# Patient Record
Sex: Female | Born: 1975 | Race: White | Hispanic: No | Smoking: Never smoker
Health system: Southern US, Community
[De-identification: ages and names within clinical notes are randomized; demographics above are authoritative.]

## PROBLEM LIST (undated history)

## (undated) DIAGNOSIS — F329 Major depressive disorder, single episode, unspecified: Secondary | ICD-10-CM

## (undated) DIAGNOSIS — Z5189 Encounter for other specified aftercare: Secondary | ICD-10-CM

## (undated) DIAGNOSIS — J45909 Unspecified asthma, uncomplicated: Secondary | ICD-10-CM

## (undated) DIAGNOSIS — M5430 Sciatica, unspecified side: Secondary | ICD-10-CM

## (undated) DIAGNOSIS — K589 Irritable bowel syndrome without diarrhea: Secondary | ICD-10-CM

## (undated) DIAGNOSIS — G43909 Migraine, unspecified, not intractable, without status migrainosus: Secondary | ICD-10-CM

## (undated) DIAGNOSIS — M545 Low back pain, unspecified: Secondary | ICD-10-CM

## (undated) DIAGNOSIS — K219 Gastro-esophageal reflux disease without esophagitis: Secondary | ICD-10-CM

## (undated) DIAGNOSIS — G4700 Insomnia, unspecified: Secondary | ICD-10-CM

## (undated) DIAGNOSIS — F988 Other specified behavioral and emotional disorders with onset usually occurring in childhood and adolescence: Secondary | ICD-10-CM

## (undated) DIAGNOSIS — G4730 Sleep apnea, unspecified: Secondary | ICD-10-CM

## (undated) DIAGNOSIS — F419 Anxiety disorder, unspecified: Secondary | ICD-10-CM

## (undated) DIAGNOSIS — M940 Chondrocostal junction syndrome [Tietze]: Secondary | ICD-10-CM

## (undated) DIAGNOSIS — F32A Depression, unspecified: Secondary | ICD-10-CM

## (undated) HISTORY — PX: ABDOMINAL HYSTERECTOMY: SHX81

## (undated) HISTORY — DX: Anxiety disorder, unspecified: F41.9

## (undated) HISTORY — PX: TONSILLECTOMY: SUR1361

## (undated) HISTORY — PX: APPENDECTOMY: SHX54

## (undated) HISTORY — DX: Sciatica, unspecified side: M54.30

## (undated) HISTORY — DX: Depression, unspecified: F32.A

## (undated) HISTORY — DX: Chondrocostal junction syndrome (tietze): M94.0

## (undated) HISTORY — DX: Insomnia, unspecified: G47.00

## (undated) HISTORY — DX: Low back pain: M54.5

## (undated) HISTORY — DX: Sleep apnea, unspecified: G47.30

## (undated) HISTORY — PX: TOTAL ABDOMINAL HYSTERECTOMY W/ BILATERAL SALPINGOOPHORECTOMY: SHX83

## (undated) HISTORY — DX: Other specified behavioral and emotional disorders with onset usually occurring in childhood and adolescence: F98.8

## (undated) HISTORY — DX: Low back pain, unspecified: M54.50

## (undated) HISTORY — DX: Encounter for other specified aftercare: Z51.89

## (undated) HISTORY — DX: Gastro-esophageal reflux disease without esophagitis: K21.9

## (undated) HISTORY — DX: Irritable bowel syndrome, unspecified: K58.9

## (undated) HISTORY — DX: Major depressive disorder, single episode, unspecified: F32.9

## (undated) HISTORY — DX: Unspecified asthma, uncomplicated: J45.909

## (undated) HISTORY — PX: TUBAL LIGATION: SHX77

---

## 2004-03-03 ENCOUNTER — Observation Stay (HOSPITAL_COMMUNITY): Admission: RE | Admit: 2004-03-03 | Discharge: 2004-03-04 | Payer: Self-pay | Admitting: Gynecology

## 2004-03-08 ENCOUNTER — Emergency Department: Payer: Self-pay | Admitting: Emergency Medicine

## 2004-10-28 ENCOUNTER — Ambulatory Visit: Payer: Self-pay

## 2004-10-29 ENCOUNTER — Ambulatory Visit: Payer: Self-pay

## 2004-10-30 ENCOUNTER — Ambulatory Visit: Payer: Self-pay

## 2004-11-11 ENCOUNTER — Ambulatory Visit: Payer: Self-pay

## 2005-07-24 ENCOUNTER — Ambulatory Visit (HOSPITAL_COMMUNITY): Admission: RE | Admit: 2005-07-24 | Discharge: 2005-07-24 | Payer: Self-pay | Admitting: Gynecology

## 2005-07-27 ENCOUNTER — Observation Stay: Payer: Self-pay | Admitting: Obstetrics and Gynecology

## 2005-08-28 ENCOUNTER — Ambulatory Visit: Payer: Self-pay | Admitting: Gynecology

## 2005-11-13 ENCOUNTER — Other Ambulatory Visit: Payer: Self-pay

## 2005-11-13 ENCOUNTER — Emergency Department: Payer: Self-pay | Admitting: Emergency Medicine

## 2006-02-15 ENCOUNTER — Emergency Department: Payer: Self-pay | Admitting: Emergency Medicine

## 2006-06-25 ENCOUNTER — Inpatient Hospital Stay: Payer: Self-pay | Admitting: Obstetrics and Gynecology

## 2006-08-11 ENCOUNTER — Ambulatory Visit: Payer: Self-pay | Admitting: Family Medicine

## 2006-12-14 ENCOUNTER — Emergency Department: Payer: Self-pay | Admitting: Emergency Medicine

## 2007-03-16 ENCOUNTER — Emergency Department: Payer: Self-pay | Admitting: Emergency Medicine

## 2007-06-16 ENCOUNTER — Emergency Department: Payer: Self-pay

## 2007-08-18 ENCOUNTER — Emergency Department: Payer: Self-pay | Admitting: Emergency Medicine

## 2007-11-15 ENCOUNTER — Emergency Department: Payer: Self-pay | Admitting: Emergency Medicine

## 2007-11-15 ENCOUNTER — Other Ambulatory Visit: Payer: Self-pay

## 2008-07-31 ENCOUNTER — Emergency Department: Payer: Self-pay | Admitting: Emergency Medicine

## 2010-06-18 ENCOUNTER — Emergency Department: Payer: Self-pay | Admitting: Unknown Physician Specialty

## 2010-06-28 ENCOUNTER — Encounter: Payer: Self-pay | Admitting: Gynecology

## 2010-09-25 ENCOUNTER — Emergency Department: Payer: Self-pay | Admitting: Internal Medicine

## 2011-01-11 ENCOUNTER — Emergency Department: Payer: Self-pay | Admitting: Emergency Medicine

## 2011-03-06 ENCOUNTER — Emergency Department: Payer: Self-pay | Admitting: Emergency Medicine

## 2011-03-24 ENCOUNTER — Emergency Department: Payer: Self-pay | Admitting: Emergency Medicine

## 2011-07-21 ENCOUNTER — Emergency Department: Payer: Self-pay | Admitting: Emergency Medicine

## 2011-07-21 LAB — URINALYSIS, COMPLETE
Bilirubin,UR: NEGATIVE
Blood: NEGATIVE
Glucose,UR: NEGATIVE mg/dL (ref 0–75)
Leukocyte Esterase: NEGATIVE
Nitrite: POSITIVE
Ph: 7 (ref 4.5–8.0)
Protein: NEGATIVE
RBC,UR: 3 /HPF (ref 0–5)
Specific Gravity: 1.023 (ref 1.003–1.030)
Squamous Epithelial: 2
WBC UR: 1 /HPF (ref 0–5)

## 2011-07-21 LAB — COMPREHENSIVE METABOLIC PANEL
Albumin: 4.9 g/dL (ref 3.4–5.0)
Alkaline Phosphatase: 85 U/L (ref 50–136)
Anion Gap: 9 (ref 7–16)
BUN: 9 mg/dL (ref 7–18)
Bilirubin,Total: 0.2 mg/dL (ref 0.2–1.0)
Calcium, Total: 9.6 mg/dL (ref 8.5–10.1)
Chloride: 102 mmol/L (ref 98–107)
Co2: 28 mmol/L (ref 21–32)
Creatinine: 0.6 mg/dL (ref 0.60–1.30)
EGFR (African American): >60
EGFR (Non-African Amer.): >60
Glucose: 98 mg/dL (ref 65–99)
Osmolality: 276 (ref 275–301)
Potassium: 4.5 mmol/L (ref 3.5–5.1)
SGOT(AST): 30 U/L (ref 15–37)
SGPT (ALT): 25 U/L
Sodium: 139 mmol/L (ref 136–145)
Total Protein: 8.8 g/dL — ABNORMAL HIGH (ref 6.4–8.2)

## 2011-07-21 LAB — DRUG SCREEN, URINE
Amphetamines, Ur Screen: POSITIVE (ref ?–1000)
Barbiturates, Ur Screen: NEGATIVE (ref ?–200)
Benzodiazepine, Ur Scrn: NEGATIVE (ref ?–200)
Cannabinoid 50 Ng, Ur ~~LOC~~: NEGATIVE (ref ?–50)
Cocaine Metabolite,Ur ~~LOC~~: NEGATIVE (ref ?–300)
MDMA (Ecstasy)Ur Screen: NEGATIVE (ref ?–500)
Methadone, Ur Screen: NEGATIVE (ref ?–300)
Opiate, Ur Screen: POSITIVE (ref ?–300)
Phencyclidine (PCP) Ur S: NEGATIVE (ref ?–25)
Tricyclic, Ur Screen: NEGATIVE (ref ?–1000)

## 2011-07-21 LAB — DIFFERENTIAL
Basophil #: 0 10*3/uL (ref 0.0–0.1)
Basophil %: 0.2 %
Eosinophil #: 0 10*3/uL (ref 0.0–0.7)
Eosinophil %: 0.3 %
Lymphocyte #: 2.1 10*3/uL (ref 1.0–3.6)
Lymphocyte %: 15.3 %
Monocyte #: 0.7 10*3/uL (ref 0.0–0.7)
Monocyte %: 4.8 %
Neutrophil #: 11 10*3/uL — ABNORMAL HIGH (ref 1.4–6.5)
Neutrophil %: 79.4 %

## 2011-07-21 LAB — CBC
HCT: 35.1 % (ref 35.0–47.0)
HGB: 11.9 g/dL — ABNORMAL LOW (ref 12.0–16.0)
MCH: 30.7 pg (ref 26.0–34.0)
MCHC: 34 g/dL (ref 32.0–36.0)
MCV: 90 fL (ref 80–100)
Platelet: 306 10*3/uL (ref 150–440)
RBC: 3.88 10*6/uL (ref 3.80–5.20)
RDW: 13.9 % (ref 11.5–14.5)
WBC: 13.9 10*3/uL — ABNORMAL HIGH (ref 3.6–11.0)

## 2011-07-21 LAB — RAPID INFLUENZA A&B ANTIGENS

## 2011-07-21 LAB — TROPONIN I: Troponin-I: <0.02 ng/mL

## 2011-11-30 ENCOUNTER — Emergency Department: Payer: Self-pay | Admitting: Emergency Medicine

## 2012-02-24 ENCOUNTER — Emergency Department: Payer: Self-pay | Admitting: Emergency Medicine

## 2012-12-23 ENCOUNTER — Emergency Department: Payer: Self-pay | Admitting: Emergency Medicine

## 2013-08-31 ENCOUNTER — Ambulatory Visit: Payer: Self-pay

## 2013-09-06 ENCOUNTER — Ambulatory Visit: Payer: Self-pay | Admitting: Family Medicine

## 2014-04-28 ENCOUNTER — Ambulatory Visit: Payer: Self-pay | Admitting: Internal Medicine

## 2014-05-02 DIAGNOSIS — K219 Gastro-esophageal reflux disease without esophagitis: Secondary | ICD-10-CM | POA: Insufficient documentation

## 2015-03-27 ENCOUNTER — Other Ambulatory Visit: Payer: Self-pay | Admitting: Internal Medicine

## 2015-03-27 DIAGNOSIS — M25552 Pain in left hip: Secondary | ICD-10-CM

## 2015-06-21 ENCOUNTER — Ambulatory Visit
Admission: EM | Admit: 2015-06-21 | Discharge: 2015-06-21 | Disposition: A | Discharge status: Home / Self Care | Payer: Medicaid Other | Attending: Family Medicine | Admitting: Family Medicine

## 2015-06-21 ENCOUNTER — Ambulatory Visit: Payer: Medicaid Other

## 2015-06-21 ENCOUNTER — Encounter: Payer: Self-pay | Admitting: Emergency Medicine

## 2015-06-21 DIAGNOSIS — X58XXXA Exposure to other specified factors, initial encounter: Secondary | ICD-10-CM | POA: Insufficient documentation

## 2015-06-21 DIAGNOSIS — S20212A Contusion of left front wall of thorax, initial encounter: Secondary | ICD-10-CM | POA: Diagnosis not present

## 2015-06-21 DIAGNOSIS — R079 Chest pain, unspecified: Secondary | ICD-10-CM | POA: Diagnosis present

## 2015-06-21 MED ORDER — CYCLOBENZAPRINE HCL 10 MG PO TABS: 10.0000 mg | ORAL_TABLET | Freq: Every day | ORAL | Status: DC

## 2015-06-21 MED ORDER — HYDROCODONE-ACETAMINOPHEN 5-325 MG PO TABS: ORAL_TABLET | ORAL | Status: DC
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
## 2015-09-27 DIAGNOSIS — M5432 Sciatica, left side: Secondary | ICD-10-CM | POA: Insufficient documentation

## 2015-12-08 ENCOUNTER — Encounter: Payer: Self-pay | Admitting: Gynecology

## 2015-12-08 ENCOUNTER — Ambulatory Visit
Admission: EM | Admit: 2015-12-08 | Discharge: 2015-12-08 | Disposition: A | Discharge status: Home / Self Care | Payer: Medicaid Other | Attending: Family Medicine | Admitting: Family Medicine

## 2015-12-08 DIAGNOSIS — G43009 Migraine without aura, not intractable, without status migrainosus: Secondary | ICD-10-CM | POA: Diagnosis not present

## 2015-12-08 HISTORY — DX: Migraine, unspecified, not intractable, without status migrainosus: G43.909

## 2015-12-08 MED ORDER — PROMETHAZINE HCL 25 MG/ML IJ SOLN
25.0000 mg | Freq: Once | INTRAMUSCULAR | Status: AC
  Administered 2015-12-08: 25 mg via INTRAMUSCULAR

## 2015-12-08 MED ORDER — KETOROLAC TROMETHAMINE 60 MG/2ML IM SOLN
60.0000 mg | Freq: Once | INTRAMUSCULAR | Status: AC
  Administered 2015-12-08: 60 mg via INTRAMUSCULAR

## 2015-12-08 MED ORDER — ONDANSETRON 8 MG PO TBDP
8.0000 mg | ORAL_TABLET | Freq: Once | ORAL | Status: AC
  Administered 2015-12-08: 8 mg via ORAL

## 2015-12-08 NOTE — Discharge Instructions (Signed)

## 2015-12-08 NOTE — ED Notes (Signed)
Patient c/o of migraine x last pm. Per patient has an history of migraine and take Rx Maxalt.

## 2015-12-08 NOTE — ED Notes (Signed)
Patient waited 15 minutes and no reactions were noted. Injection site is unremarkable. 

## 2016-01-21 NOTE — ED Provider Notes (Signed)
[facility]    CSN: [id number] Arrival date & time: 12/08/15  1234  First Provider Contact:  First MD Initiated Contact with Patient 12/08/15 1306        History   Chief Complaint Chief Complaint  Patient presents with  . Migraine    HPI Laurie Althia FortsDianne Roebuck is a 40 y.o. female.   The history is provided by the patient.  Migraine   Patient c/o of migraine x last pm. Per patient has an history of migraine and take Rx Maxalt. Migraine associated with photophobia, nausea. Denies numbness/tingling, fevers, chills, vision changes, one-sided weakness.   Past Medical History:  Diagnosis Date  . Migraine     There are no active problems to display for this patient.   Past Surgical History:  Procedure Laterality Date  . ABDOMINAL HYSTERECTOMY    . APPENDECTOMY    . CESAREAN SECTION    . TONSILLECTOMY      OB History    No data available       Home Medications    Prior to Admission medications   Medication Sig Start Date End Date Taking? Authorizing Provider  rizatriptan (MAXALT) 10 MG tablet Take 10 mg by mouth as needed for migraine. May repeat in 2 hours if needed   Yes Historical Provider, MD  acetaminophen (TYLENOL) 500 MG tablet Take 500 mg by mouth every 6 (six) hours as needed.    Historical Provider, MD  cyclobenzaprine (FLEXERIL) 10 MG tablet Take 1 tablet (10 mg total) by mouth at bedtime. 06/21/15   Payton Mccallumrlando Abdallah Hern, MD  HYDROcodone-acetaminophen (NORCO/VICODIN) 5-325 MG tablet 1-2 tabs po q 8 hours prn 06/21/15   Payton Mccallumrlando Sadiyah Kangas, MD  ibuprofen (ADVIL,MOTRIN) 800 MG tablet Take 800 mg by mouth every 8 (eight) hours as needed.    Historical Provider, MD    Family History No family history on file.  Social History Social History  Substance Use Topics  . Smoking status: Never Smoker  . Smokeless tobacco: Not on file  . Alcohol use No     Allergies   Ultram [tramadol] and Sulfa antibiotics   Review of Systems Review of Systems   Physical  Exam Triage Vital Signs ED Triage Vitals  Enc Vitals Group     BP 12/08/15 1249 131/85     Pulse Rate 12/08/15 1249 84     Resp 12/08/15 1249 16     Temp 12/08/15 1249 98 F (36.7 C)     Temp Source 12/08/15 1249 Oral     SpO2 12/08/15 1249 100 %     Weight 12/08/15 1249 160 lb (72.6 kg)     Height 12/08/15 1249 4\' 11"  (1.499 m)     Head Circumference --      Peak Flow --      Pain Score 12/08/15 1254 8     Pain Loc --      Pain Edu? --      Excl. in GC? --    No data found.   Updated Vital Signs BP 131/85 (BP Location: Left Arm)   Pulse 84   Temp 98 F (36.7 C) (Oral)   Resp 16   Ht 4\' 11"  (1.499 m)   Wt 160 lb (72.6 kg)   SpO2 100%   BMI 32.32 kg/m   Visual Acuity Right Eye Distance:   Left Eye Distance:   Bilateral Distance:    Right Eye Near:   Left Eye Near:    Bilateral Near:  Physical Exam  Constitutional: She is oriented to person, place, and time. She appears well-developed and well-nourished. No distress.  HENT:  Head: Normocephalic.  Right Ear: Tympanic membrane, external ear and ear canal normal.  Left Ear: Tympanic membrane, external ear and ear canal normal.  Nose: Nose normal.  Mouth/Throat: Oropharynx is clear and moist and mucous membranes are normal.  Eyes: Conjunctivae and EOM are normal. Pupils are equal, round, and reactive to light. Right eye exhibits no discharge. Left eye exhibits no discharge. No scleral icterus.  Neck: Normal range of motion. Neck supple. No JVD present. No tracheal deviation present. No thyromegaly present.  Cardiovascular: Normal rate, regular rhythm and normal heart sounds.   No murmur heard. Pulmonary/Chest: Effort normal. No stridor. No respiratory distress.  Musculoskeletal: She exhibits no edema or tenderness.  Lymphadenopathy:    She has no cervical adenopathy.  Neurological: She is alert and oriented to person, place, and time. She has normal reflexes. She displays normal reflexes. No cranial nerve  deficit. She exhibits normal muscle tone. Coordination normal.  Skin: Skin is warm and dry. No rash noted. She is not diaphoretic. No erythema. No pallor.  Psychiatric: She has a normal mood and affect. Her behavior is normal. Judgment and thought content normal.  Nursing note and vitals reviewed.    UC Treatments / Results  Labs (all labs ordered are listed, but only abnormal results are displayed) Labs Reviewed - No data to display  EKG  EKG Interpretation None       Radiology No results found.  Procedures Procedures (including critical care time)  Medications Ordered in UC Medications  ketorolac (TORADOL) injection 60 mg (60 mg Intramuscular Given 12/08/15 1315)  ondansetron (ZOFRAN-ODT) disintegrating tablet 8 mg (8 mg Oral Given 12/08/15 1317)  promethazine (PHENERGAN) injection 25 mg (25 mg Intramuscular Given 12/08/15 1328)     Initial Impression / Assessment and Plan / UC Course  I have reviewed the triage vital signs and the nursing notes.  Pertinent labs & imaging results that were available during my care of the patient were reviewed by me and considered in my medical decision making (see chart for details).  Clinical Course      Final Clinical Impressions(s) / UC Diagnoses   Final diagnoses:  Nonintractable migraine, unspecified migraine type    New Prescriptions Discharge Medication List as of 12/08/2015  2:07 PM     Discharge Medication List as of 12/08/2015  2:07 PM     1. diagnosis reviewed with patient 2. Patient given toradol 60mg  im x 1, zofran 8mg  odt x 1 and phenergan 25mg  im x 1 3. Recommend supportive treatment with home medications 4. Follow-up prn if symptoms worsen or don't improve   Payton Mccallumrlando Loucinda Croy, MD 01/21/16 1059

## 2017-01-20 DIAGNOSIS — G4719 Other hypersomnia: Secondary | ICD-10-CM | POA: Diagnosis not present

## 2017-01-20 DIAGNOSIS — R0683 Snoring: Secondary | ICD-10-CM | POA: Diagnosis not present

## 2017-01-20 DIAGNOSIS — G43509 Persistent migraine aura without cerebral infarction, not intractable, without status migrainosus: Secondary | ICD-10-CM | POA: Diagnosis not present

## 2017-02-18 DIAGNOSIS — G4733 Obstructive sleep apnea (adult) (pediatric): Secondary | ICD-10-CM | POA: Insufficient documentation

## 2017-04-01 DIAGNOSIS — G4733 Obstructive sleep apnea (adult) (pediatric): Secondary | ICD-10-CM | POA: Diagnosis not present

## 2017-04-01 DIAGNOSIS — G43509 Persistent migraine aura without cerebral infarction, not intractable, without status migrainosus: Secondary | ICD-10-CM | POA: Diagnosis not present

## 2017-04-01 DIAGNOSIS — K219 Gastro-esophageal reflux disease without esophagitis: Secondary | ICD-10-CM | POA: Diagnosis not present

## 2017-05-13 DIAGNOSIS — G4733 Obstructive sleep apnea (adult) (pediatric): Secondary | ICD-10-CM | POA: Diagnosis not present

## 2017-06-08 DIAGNOSIS — G4733 Obstructive sleep apnea (adult) (pediatric): Secondary | ICD-10-CM | POA: Diagnosis not present

## 2017-06-17 DIAGNOSIS — R0789 Other chest pain: Secondary | ICD-10-CM | POA: Diagnosis not present

## 2017-07-09 DIAGNOSIS — G4733 Obstructive sleep apnea (adult) (pediatric): Secondary | ICD-10-CM | POA: Diagnosis not present

## 2017-08-06 DIAGNOSIS — G4733 Obstructive sleep apnea (adult) (pediatric): Secondary | ICD-10-CM | POA: Diagnosis not present

## 2017-08-18 ENCOUNTER — Encounter: Payer: Self-pay | Admitting: Family Medicine

## 2017-08-18 ENCOUNTER — Ambulatory Visit: Payer: Medicaid Other | Admitting: Family Medicine

## 2017-08-18 VITALS — BP 151/101 | HR 94 | Temp 98.2°F | Ht 63.0 in | Wt 182.6 lb

## 2017-08-18 DIAGNOSIS — Z7689 Persons encountering health services in other specified circumstances: Secondary | ICD-10-CM

## 2017-08-18 DIAGNOSIS — F902 Attention-deficit hyperactivity disorder, combined type: Secondary | ICD-10-CM | POA: Diagnosis not present

## 2017-08-18 DIAGNOSIS — N951 Menopausal and female climacteric states: Secondary | ICD-10-CM

## 2017-08-18 DIAGNOSIS — G43909 Migraine, unspecified, not intractable, without status migrainosus: Secondary | ICD-10-CM | POA: Diagnosis not present

## 2017-08-18 DIAGNOSIS — R350 Frequency of micturition: Secondary | ICD-10-CM | POA: Diagnosis not present

## 2017-08-18 DIAGNOSIS — I10 Essential (primary) hypertension: Secondary | ICD-10-CM | POA: Diagnosis not present

## 2017-08-18 DIAGNOSIS — Z79899 Other long term (current) drug therapy: Secondary | ICD-10-CM | POA: Diagnosis not present

## 2017-08-18 LAB — UA/M W/RFLX CULTURE, ROUTINE
Bilirubin, UA: NEGATIVE
Glucose, UA: NEGATIVE
Ketones, UA: NEGATIVE
Leukocytes, UA: NEGATIVE
Nitrite, UA: NEGATIVE
Protein, UA: NEGATIVE
Specific Gravity, UA: >1.03 — ABNORMAL HIGH (ref 1.005–1.030)
Urobilinogen, Ur: 0.2 mg/dL (ref 0.2–1.0)
pH, UA: 5.5 (ref 5.0–7.5)

## 2017-08-18 LAB — MICROSCOPIC EXAMINATION

## 2017-08-18 MED ORDER — AMPHETAMINE-DEXTROAMPHETAMINE 20 MG PO TABS: 20.0000 mg | ORAL_TABLET | Freq: Two times a day (BID) | ORAL | 0 refills | Status: DC

## 2017-08-18 MED ORDER — ESTRADIOL 1 MG PO TABS: 1.0000 mg | ORAL_TABLET | Freq: Every day | ORAL | 1 refills | Status: DC

## 2017-08-18 MED ORDER — ERENUMAB-AOOE 70 MG/ML ~~LOC~~ SOAJ: 1.0000 "pen " | SUBCUTANEOUS | 3 refills | Status: DC

## 2017-08-18 MED ORDER — BUTORPHANOL TARTRATE 10 MG/ML NA SOLN: NASAL | 0 refills | Status: DC

## 2017-08-18 MED ORDER — RIZATRIPTAN BENZOATE 10 MG PO TABS: 10.0000 mg | ORAL_TABLET | ORAL | 6 refills | Status: DC | PRN

## 2017-08-18 MED ORDER — GABAPENTIN 300 MG PO CAPS: 300.0000 mg | ORAL_CAPSULE | Freq: Three times a day (TID) | ORAL | 6 refills | Status: DC

## 2017-08-18 MED ORDER — LISINOPRIL 20 MG PO TABS: 20.0000 mg | ORAL_TABLET | Freq: Every day | ORAL | 1 refills | Status: DC

## 2017-08-18 NOTE — Progress Notes (Signed)
BP (!) 151/101 (BP Location: Right Arm, Patient Position: Sitting, Cuff Size: Normal)   Pulse 94   Temp 98.2 F (36.8 C) (Oral)   Ht 5\' 3"  (1.6 m)   Wt 182 lb 9.6 oz (82.8 kg)   SpO2 99%   BMI 32.35 kg/m    Subjective:    Patient ID: Laurie Roman, female    DOB: 11-25-1975, 42 y.o.   MRN: [id number]  HPI: Laurie Roman is a 42 y.o. female  Chief Complaint  Patient presents with  . Establish Care    Need new PCP. Old PCP (Dr. Madaline Guthrie) retired.  . Hypertension   Pt here today to establish care. Has not had a regular CPE in over a year, has been following prn with urgent cares for this amount of time.   Has had uncontrolled BPs and HAs for years, starting to find relief with migraines with her current regimen of topamax, maxalt prn, gabapentin, and now aimovig. Takes phenergan prn for migraine assoc nausea. BPs consistently above 150s/90s-100s, has never been treated for this before. Denies visual disturbances, CP, SOB.   Diagnosed with ADHD years ago, was previously on adderall 20 mg with good results. Has not needed it for years as she wasn't working, but now working again and finding significant issues both there and trying to get things done at home. Will start multiple tasks at once and never finish them.  *Did take a 1/2 adderall 2 days ago and has had codeine cough syrup given by UC for cough*  Also c/o low libido, weight gain, hot flashes, mood issues since hysterectomy with b/l oophrectomy 10-15 years ago. Has never tried any medications for this, thought maybe sxs would pass. They are significantly affecting her quality of life at this point.   Relevant past medical, surgical, family and social history reviewed and updated as indicated. Interim medical history since our last visit reviewed. Allergies and medications reviewed and updated.  Review of Systems  Per HPI unless specifically indicated above     Objective:    BP (!) 151/101 (BP Location: Right  Arm, Patient Position: Sitting, Cuff Size: Normal)   Pulse 94   Temp 98.2 F (36.8 C) (Oral)   Ht 5\' 3"  (1.6 m)   Wt 182 lb 9.6 oz (82.8 kg)   SpO2 99%   BMI 32.35 kg/m   Wt Readings from Last 3 Encounters:  08/18/17 182 lb 9.6 oz (82.8 kg)  12/08/15 160 lb (72.6 kg)  06/21/15 150 lb (68 kg)    Physical Exam  Constitutional: She is oriented to person, place, and time. She appears well-developed and well-nourished. No distress.  HENT:  Head: Atraumatic.  Eyes: Conjunctivae are normal. Pupils are equal, round, and reactive to light. No scleral icterus.  Neck: Normal range of motion. Neck supple. No thyromegaly present.  Cardiovascular: Normal rate, regular rhythm, normal heart sounds and intact distal pulses.  Pulmonary/Chest: Effort normal and breath sounds normal. No respiratory distress.  Abdominal: Soft. Bowel sounds are normal.  Musculoskeletal: Normal range of motion. She exhibits no edema or tenderness.  Lymphadenopathy:    She has no cervical adenopathy.  Neurological: She is alert and oriented to person, place, and time. No cranial nerve deficit.  Skin: Skin is warm and dry. No rash noted.  Psychiatric: She has a normal mood and affect. Her behavior is normal.  Nursing note and vitals reviewed.   Results for orders placed or performed in visit on 08/18/17  Microscopic  Examination  Result Value Ref Range   WBC, UA 0-5 0 - 5 /hpf   RBC, UA 0-2 0 - 2 /hpf   Epithelial Cells (non renal) 0-10 0 - 10 /hpf   Bacteria, UA Few None seen/Few  Basic Metabolic Panel (BMET)  Result Value Ref Range   Glucose 93 65 - 99 mg/dL   BUN 9 6 - 24 mg/dL   Creatinine, Ser 2.950.62 0.57 - 1.00 mg/dL   GFR calc non Af Amer 112 >59 mL/min/1.73   GFR calc Af Amer 130 >59 mL/min/1.73   BUN/Creatinine Ratio 15 9 - 23   Sodium 141 134 - 144 mmol/L   Potassium 4.7 3.5 - 5.2 mmol/L   Chloride 102 96 - 106 mmol/L   CO2 15 (L) 20 - 29 mmol/L   Calcium 10.2 8.7 - 10.2 mg/dL  UA/M w/rflx Culture,  Routine  Result Value Ref Range   Specific Gravity, UA >1.030 (H) 1.005 - 1.030   pH, UA 5.5 5.0 - 7.5   Color, UA Yellow Yellow   Appearance Ur Hazy (A) Clear   Leukocytes, UA Negative Negative   Protein, UA Negative Negative/Trace   Glucose, UA Negative Negative   Ketones, UA Negative Negative   RBC, UA Trace (A) Negative   Bilirubin, UA Negative Negative   Urobilinogen, Ur 0.2 0.2 - 1.0 mg/dL   Nitrite, UA Negative Negative   Microscopic Examination See below:       Assessment & Plan:   Problem List Items Addressed This Visit      Cardiovascular and Mediastinum   Migraine    Having good results with addition of aimovig, also on topamax, prn maxalt, and gabapentin. Continue current regimen. Has stadol nasal spray for severe intractable episodes      Relevant Medications   lisinopril (PRINIVIL,ZESTRIL) 20 MG tablet   Erenumab-aooe (AIMOVIG) 70 MG/ML SOAJ   rizatriptan (MAXALT) 10 MG tablet   gabapentin (NEURONTIN) 300 MG capsule   butorphanol (STADOL) 10 MG/ML nasal spray   Essential hypertension - Primary    Has had poorly controlled BPs for years, has never been treated. Will start lisinopril at 20 mg and monitor closely. Reviewed precautions for hypotension as well as common side effects with the medication. WIll recheck in 1 month      Relevant Medications   lisinopril (PRINIVIL,ZESTRIL) 20 MG tablet   Other Relevant Orders   Basic Metabolic Panel (BMET) (Completed)     Other   ADHD    Restart adderall, long discussion about precautions and addictive potential. Controlled substance agreement signed, UDS performed.        Other Visit Diagnoses    Encounter to establish care       Controlled substance agreement signed       Relevant Orders   Urine drugs of abuse scrn w alc, routine (Ref Lab)   Urinary frequency       Relevant Orders   UA/M w/rflx Culture, Routine (Completed)   Menopausal symptom       Discussed several options with pt, will try oral estrace  and monitor for symptomatic relief. Risks and benefits reviewed, particularly for worsening migraines       Follow up plan: Return in about 4 weeks (around 09/15/2017) for BP, hormone f/u.

## 2017-08-19 ENCOUNTER — Telehealth: Payer: Self-pay | Admitting: Family Medicine

## 2017-08-19 LAB — BASIC METABOLIC PANEL
BUN/Creatinine Ratio: 15 (ref 9–23)
BUN: 9 mg/dL (ref 6–24)
CO2: 15 mmol/L — ABNORMAL LOW (ref 20–29)
Calcium: 10.2 mg/dL (ref 8.7–10.2)
Chloride: 102 mmol/L (ref 96–106)
Creatinine, Ser: 0.62 mg/dL (ref 0.57–1.00)
GFR calc Af Amer: 130 mL/min/{1.73_m2} (ref >59)
GFR calc non Af Amer: 112 mL/min/{1.73_m2} (ref >59)
Glucose: 93 mg/dL (ref 65–99)
Potassium: 4.7 mmol/L (ref 3.5–5.2)
Sodium: 141 mmol/L (ref 134–144)

## 2017-08-19 NOTE — Telephone Encounter (Signed)
Patient needs to know about the following medications PA status  Stadol butorphanol Estradiol   [address] Rolanda Jayhar  [place]  [phone]  Thank you

## 2017-08-20 ENCOUNTER — Telehealth: Payer: Self-pay | Admitting: Family Medicine

## 2017-08-20 NOTE — Telephone Encounter (Signed)
Patient would like for Fleet ContrasRachel to change her script for Estrace changed to the other medication that she had discussed with her since Medicaid is denying it.   [address]

## 2017-08-21 DIAGNOSIS — F909 Attention-deficit hyperactivity disorder, unspecified type: Secondary | ICD-10-CM | POA: Insufficient documentation

## 2017-08-21 DIAGNOSIS — I10 Essential (primary) hypertension: Secondary | ICD-10-CM | POA: Insufficient documentation

## 2017-08-21 DIAGNOSIS — G43909 Migraine, unspecified, not intractable, without status migrainosus: Secondary | ICD-10-CM | POA: Insufficient documentation

## 2017-08-21 NOTE — Patient Instructions (Signed)
Follow up in 1 month   

## 2017-08-21 NOTE — Assessment & Plan Note (Signed)
Restart adderall, long discussion about precautions and addictive potential. Controlled substance agreement signed, UDS performed.

## 2017-08-21 NOTE — Assessment & Plan Note (Signed)
Has had poorly controlled BPs for years, has never been treated. Will start lisinopril at 20 mg and monitor closely. Reviewed precautions for hypotension as well as common side effects with the medication. WIll recheck in 1 month

## 2017-08-21 NOTE — Assessment & Plan Note (Signed)
Having good results with addition of aimovig, also on topamax, prn maxalt, and gabapentin. Continue current regimen. Has stadol nasal spray for severe intractable episodes

## 2017-08-23 NOTE — Telephone Encounter (Signed)
Left message for her to return call to discuss medication change and her lab results

## 2017-08-24 ENCOUNTER — Ambulatory Visit: Payer: Self-pay

## 2017-08-24 MED ORDER — ESTRADIOL 0.025 MG/24HR TD PTTW: 1.0000 | MEDICATED_PATCH | TRANSDERMAL | 12 refills | Status: DC

## 2017-08-24 NOTE — Telephone Encounter (Signed)
Called pt back, discussed normal lab results as well as changing to estrogen patch to see if covered by insurance. If not covered, will contact insurance to discuss covered alternatives

## 2017-08-24 NOTE — Telephone Encounter (Signed)
Patient called with c/o "elevated BP." She says "I was in the office last week and my BP was elevated, so she started me on Lisinopril 20 mg daily. Every day about 2-4 pm, I get dizzy and nauseated, kind of lightheaded. This has been going on for a good while, but she is the only one who addressed the BP. She told me to let her know how the BP is doing with this medication." I asked when did she check the BP, she says "about 30-45 minutes ago." I asked about headache, she says "no." According to protocol, see PCP within 3 days, appointment made for tomorrow at 1345, care advice given. I advised that this would be sent to the provider and if she decides that the patient doesn't need to be seen in the office tomorrow, someone will call to let her know.   Reason for Disposition . Systolic BP  >= 160 OR Diastolic >= 100  Answer Assessment - Initial Assessment Questions 1. BLOOD PRESSURE: "What is the blood pressure?" "Did you take at least two measurements 5 minutes apart?"     151/110 2. ONSET: "When did you take your blood pressure?"     Around 30-45 minutes ago 3. HOW: "How did you obtain the blood pressure?" (e.g., visiting nurse, automatic home BP monitor)     Automatic home BP monitor 4. HISTORY: "Do you have a history of high blood pressure?"     Yes 5. MEDICATIONS: "Are you taking any medications for blood pressure?" "Have you missed any doses recently?"     Lisinopril 20 mg daily, not missed doses 6. OTHER SYMPTOMS: "Do you have any symptoms?" (e.g., headache, chest pain, blurred vision, difficulty breathing, weakness)     Lightheaded, nausea 7. PREGNANCY: "Is there any chance you are pregnant?" "When was your last menstrual period?"     No  Protocols used: HIGH BLOOD PRESSURE-A-AH

## 2017-08-24 NOTE — Telephone Encounter (Signed)
Patient returning call [phone]

## 2017-08-24 NOTE — Telephone Encounter (Signed)
Routing to provider  

## 2017-08-25 ENCOUNTER — Ambulatory Visit: Payer: Medicaid Other | Admitting: Family Medicine

## 2017-08-25 ENCOUNTER — Encounter: Payer: Self-pay | Admitting: Family Medicine

## 2017-08-25 VITALS — BP 124/84 | HR 93 | Temp 98.4°F | Wt 179.7 lb

## 2017-08-25 DIAGNOSIS — R0602 Shortness of breath: Secondary | ICD-10-CM | POA: Diagnosis not present

## 2017-08-25 DIAGNOSIS — I10 Essential (primary) hypertension: Secondary | ICD-10-CM

## 2017-08-25 DIAGNOSIS — R1011 Right upper quadrant pain: Secondary | ICD-10-CM | POA: Diagnosis not present

## 2017-08-25 MED ORDER — PANTOPRAZOLE SODIUM 40 MG PO TBEC: 40.0000 mg | DELAYED_RELEASE_TABLET | Freq: Every day | ORAL | 3 refills | Status: DC

## 2017-08-25 MED ORDER — HYDROCHLOROTHIAZIDE 12.5 MG PO TABS: 12.5000 mg | ORAL_TABLET | Freq: Every day | ORAL | 0 refills | Status: DC

## 2017-08-25 MED ORDER — SUCRALFATE 1 G PO TABS: 1.0000 g | ORAL_TABLET | Freq: Three times a day (TID) | ORAL | 0 refills | Status: DC

## 2017-08-25 NOTE — Progress Notes (Signed)
BP 124/84 (BP Location: Right Arm, Patient Position: Sitting, Cuff Size: Normal)   Pulse 93   Temp 98.4 F (36.9 C) (Oral)   Wt 179 lb 11.2 oz (81.5 kg)   SpO2 99%   BMI 31.83 kg/m    Subjective:    Patient ID: Brita RompMelissa Dianne Poythress, female    DOB: 04/02/1976, 42 y.o.   MRN: [id number]  HPI: Brita RompMelissa Dianne Lerman is a 42 y.o. female  Chief Complaint  Patient presents with  . Hypertension    Blood pressure medication making nauseas and light headed. Blood pressure will still be high after taking medicaiton. Yesterday patient states it was 151/110.  . Medication Problem   Pt here today with nausea, light headedness, SOB, and worsened right upper abdominal discomfort since starting lisinopril a few days ago. Unsure if they are correlated but not wanting to continue the medicine just in case. Denies syncope, diaphoresis, CP, vomiting, fevers. Has not been trying anything for sxs. She's concerned about her gallbladder, states gallbladder issues run in her family.   Relevant past medical, surgical, family and social history reviewed and updated as indicated. Interim medical history since our last visit reviewed. Allergies and medications reviewed and updated.  Review of Systems  Per HPI unless specifically indicated above     Objective:    BP 124/84 (BP Location: Right Arm, Patient Position: Sitting, Cuff Size: Normal)   Pulse 93   Temp 98.4 F (36.9 C) (Oral)   Wt 179 lb 11.2 oz (81.5 kg)   SpO2 99%   BMI 31.83 kg/m   Wt Readings from Last 3 Encounters:  08/25/17 179 lb 11.2 oz (81.5 kg)  08/18/17 182 lb 9.6 oz (82.8 kg)  12/08/15 160 lb (72.6 kg)    Physical Exam  Constitutional: She is oriented to person, place, and time. She appears well-developed and well-nourished. No distress.  HENT:  Head: Atraumatic.  Mouth/Throat: No oropharyngeal exudate.  Eyes: Pupils are equal, round, and reactive to light. Conjunctivae and EOM are normal. No scleral icterus.  Neck: Normal  range of motion. Neck supple.  Cardiovascular: Normal rate and normal heart sounds.  Pulmonary/Chest: Effort normal and breath sounds normal. No respiratory distress.  Abdominal: Soft. Bowel sounds are normal. There is tenderness (mild ttp, but appears more superficial than abdominal). There is no rebound and no guarding.  - murphys sign  Musculoskeletal: Normal range of motion.  Neurological: She is alert and oriented to person, place, and time. No cranial nerve deficit.  Skin: Skin is warm and dry. She is not diaphoretic.  Psychiatric: She has a normal mood and affect. Her behavior is normal.  Nursing note and vitals reviewed.   Results for orders placed or performed in visit on 08/18/17  Microscopic Examination  Result Value Ref Range   WBC, UA 0-5 0 - 5 /hpf   RBC, UA 0-2 0 - 2 /hpf   Epithelial Cells (non renal) 0-10 0 - 10 /hpf   Bacteria, UA Few None seen/Few  Basic Metabolic Panel (BMET)  Result Value Ref Range   Glucose 93 65 - 99 mg/dL   BUN 9 6 - 24 mg/dL   Creatinine, Ser 7.820.62 0.57 - 1.00 mg/dL   GFR calc non Af Amer 112 >59 mL/min/1.73   GFR calc Af Amer 130 >59 mL/min/1.73   BUN/Creatinine Ratio 15 9 - 23   Sodium 141 134 - 144 mmol/L   Potassium 4.7 3.5 - 5.2 mmol/L   Chloride 102 96 - 106  mmol/L   CO2 15 (L) 20 - 29 mmol/L   Calcium 10.2 8.7 - 10.2 mg/dL  UA/M w/rflx Culture, Routine  Result Value Ref Range   Specific Gravity, UA >1.030 (H) 1.005 - 1.030   pH, UA 5.5 5.0 - 7.5   Color, UA Yellow Yellow   Appearance Ur Hazy (A) Clear   Leukocytes, UA Negative Negative   Protein, UA Negative Negative/Trace   Glucose, UA Negative Negative   Ketones, UA Negative Negative   RBC, UA Trace (A) Negative   Bilirubin, UA Negative Negative   Urobilinogen, Ur 0.2 0.2 - 1.0 mg/dL   Nitrite, UA Negative Negative   Microscopic Examination See below:       Assessment & Plan:   Problem List Items Addressed This Visit      Cardiovascular and Mediastinum   Essential  hypertension    Pt with lots of new/worsened sxs after starting lisinopril, wanting to try d/c. Will switch to HCTZ and monitor closely with home BP monitor.       Relevant Medications   hydrochlorothiazide (HYDRODIURIL) 12.5 MG tablet    Other Visit Diagnoses    SOB (shortness of breath)    -  Primary   Relevant Orders   EKG 12-Lead (Completed)   DG Chest 2 View   RUQ pain       Pt agreeable to RUQ u/s to r/o gallbladder as this pain has been ongoing for months. Await results, BRAT diet and carafate in meantime   Relevant Orders   US Abdomen Limited RUQ    Pt well appearing today, EKG WNL and lungs CTAB. Offered chest CT to r/o PE and other urgent chest pathology but pt opting to change medication and monitor for now. Discussed strict ER return precautions, and that we could not 100% r/o MI with just a normal EKG without troponins. Pt aware of red flag signs and will continue to monitor closely  Follow up plan: Return for as scheduled in 2 weeks.

## 2017-08-26 ENCOUNTER — Telehealth: Payer: Self-pay | Admitting: Family Medicine

## 2017-08-26 MED ORDER — HYDROCOD POLST-CPM POLST ER 10-8 MG/5ML PO SUER: 5.0000 mL | Freq: Two times a day (BID) | ORAL | 0 refills | Status: DC | PRN

## 2017-08-26 MED ORDER — GUAIFENESIN-CODEINE 100-10 MG/5ML PO SOLN: 5.0000 mL | Freq: Three times a day (TID) | ORAL | 0 refills | Status: DC | PRN

## 2017-08-26 NOTE — Telephone Encounter (Signed)
Copied from CRM [id number]. Topic: Quick Communication - See Telephone Encounter >> Aug 26, 2017  3:32 PM Lelon FrohlichGolden, Bo Teicher, RMA wrote: CRM for notification. See Telephone encounter for: 08/26/17.pt wanted something called in for a cough pt [facility] and pt phone # [phone] pt stated she has already taken tessalon

## 2017-08-27 MED ORDER — ESTRADIOL 0.025 MG/24HR TD PTTW: 1.0000 | MEDICATED_PATCH | TRANSDERMAL | 12 refills | Status: DC

## 2017-08-27 NOTE — Telephone Encounter (Signed)
[facility]

## 2017-08-27 NOTE — Telephone Encounter (Signed)
Patient aware of script upfront, also states that estradiol (VIVELLE-DOT) 0.025 MG/24HR was sent to wrong pharmacy, [facility]. Please advise

## 2017-08-27 NOTE — Telephone Encounter (Signed)
The only thing stronger than the tessalon I could provide would be some tussionex or similar syrup. I have left a script up front she could pick up if this is what she's thinking will help

## 2017-08-28 NOTE — Assessment & Plan Note (Signed)
Pt with lots of new/worsened sxs after starting lisinopril, wanting to try d/c. Will switch to HCTZ and monitor closely with home BP monitor.

## 2017-08-28 NOTE — Patient Instructions (Signed)
Follow up as scheduled.  

## 2017-09-02 ENCOUNTER — Ambulatory Visit
Admission: RE | Admit: 2017-09-02 | Discharge: 2017-09-02 | Disposition: A | Discharge status: Home / Self Care | Payer: Medicaid Other | Source: Ambulatory Visit | Attending: Family Medicine | Admitting: Family Medicine

## 2017-09-02 DIAGNOSIS — R1011 Right upper quadrant pain: Secondary | ICD-10-CM | POA: Insufficient documentation

## 2017-09-02 DIAGNOSIS — R0602 Shortness of breath: Secondary | ICD-10-CM | POA: Diagnosis not present

## 2017-09-02 DIAGNOSIS — R05 Cough: Secondary | ICD-10-CM | POA: Diagnosis not present

## 2017-09-02 DIAGNOSIS — K760 Fatty (change of) liver, not elsewhere classified: Secondary | ICD-10-CM | POA: Insufficient documentation

## 2017-09-06 DIAGNOSIS — G4733 Obstructive sleep apnea (adult) (pediatric): Secondary | ICD-10-CM | POA: Diagnosis not present

## 2017-09-08 ENCOUNTER — Ambulatory Visit: Payer: Medicaid Other | Admitting: Family Medicine

## 2017-09-08 ENCOUNTER — Encounter: Payer: Self-pay | Admitting: Family Medicine

## 2017-09-08 VITALS — BP 143/104 | HR 118 | Temp 98.0°F | Wt 178.4 lb

## 2017-09-08 DIAGNOSIS — R21 Rash and other nonspecific skin eruption: Secondary | ICD-10-CM | POA: Diagnosis not present

## 2017-09-08 DIAGNOSIS — I10 Essential (primary) hypertension: Secondary | ICD-10-CM

## 2017-09-08 DIAGNOSIS — F902 Attention-deficit hyperactivity disorder, combined type: Secondary | ICD-10-CM

## 2017-09-08 DIAGNOSIS — G43909 Migraine, unspecified, not intractable, without status migrainosus: Secondary | ICD-10-CM | POA: Diagnosis not present

## 2017-09-08 DIAGNOSIS — N898 Other specified noninflammatory disorders of vagina: Secondary | ICD-10-CM

## 2017-09-08 MED ORDER — AMPHETAMINE-DEXTROAMPHETAMINE 20 MG PO TABS: 20.0000 mg | ORAL_TABLET | Freq: Two times a day (BID) | ORAL | 0 refills | Status: DC

## 2017-09-08 MED ORDER — BUDESONIDE-FORMOTEROL FUMARATE 160-4.5 MCG/ACT IN AERO: 2.0000 | INHALATION_SPRAY | Freq: Two times a day (BID) | RESPIRATORY_TRACT | 3 refills | Status: DC

## 2017-09-08 MED ORDER — BUTORPHANOL TARTRATE 10 MG/ML NA SOLN: NASAL | 0 refills | Status: DC

## 2017-09-08 MED ORDER — ERENUMAB-AOOE 70 MG/ML ~~LOC~~ SOAJ: 1.0000 "pen " | SUBCUTANEOUS | 3 refills | Status: DC

## 2017-09-08 MED ORDER — CLOBETASOL PROPIONATE 0.05 % EX CREA: 1.0000 "application " | TOPICAL_CREAM | Freq: Two times a day (BID) | CUTANEOUS | 0 refills | Status: DC

## 2017-09-08 MED ORDER — HYDROCHLOROTHIAZIDE 25 MG PO TABS: 25.0000 mg | ORAL_TABLET | Freq: Every day | ORAL | 3 refills | Status: DC

## 2017-09-08 MED ORDER — HYDROCOD POLST-CPM POLST ER 10-8 MG/5ML PO SUER: 5.0000 mL | Freq: Two times a day (BID) | ORAL | 0 refills | Status: DC | PRN

## 2017-09-08 NOTE — Patient Instructions (Signed)
Hyaluronic acid

## 2017-09-08 NOTE — Progress Notes (Signed)
BP (!) 143/104 (BP Location: Left Arm, Patient Position: Sitting, Cuff Size: Normal)   Pulse (!) 118   Temp 98 F (36.7 C) (Oral)   Wt 178 lb 6.4 oz (80.9 kg)   SpO2 98%   BMI 31.60 kg/m    Subjective:    Patient ID: Laurie Roman, female    DOB: 1975-12-07, 42 y.o.   MRN: [id number]  HPI: Laurie Roman is a 42 y.o. female  Chief Complaint  Patient presents with  . Insect Bite    x's 2 days. Extreme itching. Red around outside of bite.   Pt here today for a pruritic insect bite on right forearm that she's noticed x 2 days. Did not see what bit her.Now having redness and some swelling at the site. Putting OTC creams on the area with minimal relief. Denies fever, chills, body aches, widespread rashes.   Also wanting to discuss vaginal dryness and irritation worsening over the past few years since hysterectomy with oophorectomy. Having some bleeding and swelling, especially with intercourse. Lubricants don't seem to help. Just got her estrogen patches the other day due to some coverage issues.   Continues to do well on aimovig and prn stadol nasal spray, needing refill.   Doing very well with re-addition of adderall. States it's made a notable difference in her moods, ability to focus, and others around her have noticed improvement as well. No side effects reported.   Past Medical History:  Diagnosis Date  . Anxiety   . Asthma   . Blood transfusion without reported diagnosis   . Depression   . GERD (gastroesophageal reflux disease)   . IBS (irritable bowel syndrome)   . Insomnia   . Lumbago   . Migraine   . Sciatica   . Sleep apnea   . Tietze's disease    Social History   Socioeconomic History  . Marital status: Married    Spouse name: Not on file  . Number of children: Not on file  . Years of education: Not on file  . Highest education level: Not on file  Occupational History  . Not on file  Social Needs  . Financial resource strain: Not on file  . Food  insecurity:    Worry: Not on file    Inability: Not on file  . Transportation needs:    Medical: Not on file    Non-medical: Not on file  Tobacco Use  . Smoking status: Never Smoker  . Smokeless tobacco: Never Used  Substance and Sexual Activity  . Alcohol use: Yes    Comment: Some  . Drug use: No  . Sexual activity: Not on file  Lifestyle  . Physical activity:    Days per week: Not on file    Minutes per session: Not on file  . Stress: Not on file  Relationships  . Social connections:    Talks on phone: Not on file    Gets together: Not on file    Attends religious service: Not on file    Active member of club or organization: Not on file    Attends meetings of clubs or organizations: Not on file    Relationship status: Not on file  . Intimate partner violence:    Fear of current or ex partner: Not on file    Emotionally abused: Not on file    Physically abused: Not on file    Forced sexual activity: Not on file  Other Topics Concern  .  Not on file  Social History Narrative  . Not on file   Relevant past medical, surgical, family and social history reviewed and updated as indicated. Interim medical history since our last visit reviewed. Allergies and medications reviewed and updated.  Review of Systems  Per HPI unless specifically indicated above     Objective:    BP (!) 143/104 (BP Location: Left Arm, Patient Position: Sitting, Cuff Size: Normal)   Pulse (!) 118   Temp 98 F (36.7 C) (Oral)   Wt 178 lb 6.4 oz (80.9 kg)   SpO2 98%   BMI 31.60 kg/m   Wt Readings from Last 3 Encounters:  09/08/17 178 lb 6.4 oz (80.9 kg)  08/25/17 179 lb 11.2 oz (81.5 kg)  08/18/17 182 lb 9.6 oz (82.8 kg)    Physical Exam  Constitutional: She is oriented to person, place, and time. She appears well-developed and well-nourished. No distress.  HENT:  Head: Atraumatic.  Eyes: Pupils are equal, round, and reactive to light. Conjunctivae are normal. No scleral icterus.  Neck:  Normal range of motion. Neck supple.  Cardiovascular: Normal rate and normal heart sounds.  Pulmonary/Chest: Effort normal and breath sounds normal. No respiratory distress.  Musculoskeletal: Normal range of motion.  Neurological: She is alert and oriented to person, place, and time. No cranial nerve deficit.  Skin: Skin is warm and dry.  Erythema and edema at site of insect bite right forearm, but no dark discoloration, bulls-eye pattern, or other worrisome features.   Psychiatric: She has a normal mood and affect. Her behavior is normal.  Nursing note and vitals reviewed.   Results for orders placed or performed in visit on 08/18/17  Microscopic Examination  Result Value Ref Range   WBC, UA 0-5 0 - 5 /hpf   RBC, UA 0-2 0 - 2 /hpf   Epithelial Cells (non renal) 0-10 0 - 10 /hpf   Bacteria, UA Few None seen/Few  Basic Metabolic Panel (BMET)  Result Value Ref Range   Glucose 93 65 - 99 mg/dL   BUN 9 6 - 24 mg/dL   Creatinine, Ser 1.610.62 0.57 - 1.00 mg/dL   GFR calc non Af Amer 112 >59 mL/min/1.73   GFR calc Af Amer 130 >59 mL/min/1.73   BUN/Creatinine Ratio 15 9 - 23   Sodium 141 134 - 144 mmol/L   Potassium 4.7 3.5 - 5.2 mmol/L   Chloride 102 96 - 106 mmol/L   CO2 15 (L) 20 - 29 mmol/L   Calcium 10.2 8.7 - 10.2 mg/dL  UA/M w/rflx Culture, Routine  Result Value Ref Range   Specific Gravity, UA >1.030 (H) 1.005 - 1.030   pH, UA 5.5 5.0 - 7.5   Color, UA Yellow Yellow   Appearance Ur Hazy (A) Clear   Leukocytes, UA Negative Negative   Protein, UA Negative Negative/Trace   Glucose, UA Negative Negative   Ketones, UA Negative Negative   RBC, UA Trace (A) Negative   Bilirubin, UA Negative Negative   Urobilinogen, Ur 0.2 0.2 - 1.0 mg/dL   Nitrite, UA Negative Negative   Microscopic Examination See below:       Assessment & Plan:   Problem List Items Addressed This Visit      Cardiovascular and Mediastinum   Migraine    Refill medications, continue current regimen       Relevant Medications   hydrochlorothiazide (HYDRODIURIL) 25 MG tablet   Erenumab-aooe (AIMOVIG) 70 MG/ML SOAJ   butorphanol (STADOL) 10 MG/ML nasal  spray   Essential hypertension    Much improved after switching to HCTZ for BP, no further strange side effects like previous experience. Will increase to 25 mg and recheck as scheduled coming up.       Relevant Medications   hydrochlorothiazide (HYDRODIURIL) 25 MG tablet     Other   ADHD    Significant improvement back on adderall, continue current regimen       Other Visit Diagnoses    Rash    -  Primary   Benardyl and clobetasol cream prn. Discussed warning sxs such as fevers, color changes, bulls eye rash, new body aches or fatigue. F/u if no better   Vaginal dryness       Will give the estrogen patch a while to see if any benefit, and add hyaluronic acid topically. If no relief, will try estrace cream        Follow up plan: Return in about 1 month (around 10/06/2017) for BP, breathing f/u.

## 2017-09-10 NOTE — Assessment & Plan Note (Signed)
Refill medications, continue current regimen

## 2017-09-10 NOTE — Assessment & Plan Note (Signed)
Much improved after switching to HCTZ for BP, no further strange side effects like previous experience. Will increase to 25 mg and recheck as scheduled coming up.

## 2017-09-10 NOTE — Assessment & Plan Note (Addendum)
Significant improvement back on adderall, continue current regimen

## 2017-09-15 ENCOUNTER — Ambulatory Visit: Payer: Medicaid Other | Admitting: Family Medicine

## 2017-09-16 ENCOUNTER — Encounter: Payer: Self-pay | Admitting: Family Medicine

## 2017-09-16 ENCOUNTER — Ambulatory Visit: Payer: Medicaid Other | Admitting: Family Medicine

## 2017-09-16 VITALS — BP 134/85 | HR 124 | Temp 98.7°F | Ht 63.0 in | Wt 178.0 lb

## 2017-09-16 DIAGNOSIS — S93402D Sprain of unspecified ligament of left ankle, subsequent encounter: Secondary | ICD-10-CM | POA: Diagnosis not present

## 2017-09-16 DIAGNOSIS — S93602A Unspecified sprain of left foot, initial encounter: Secondary | ICD-10-CM | POA: Diagnosis not present

## 2017-09-16 MED ORDER — CYCLOBENZAPRINE HCL 10 MG PO TABS: 10.0000 mg | ORAL_TABLET | Freq: Every day | ORAL | 0 refills | Status: DC

## 2017-09-16 MED ORDER — OXYCODONE-ACETAMINOPHEN 10-325 MG PO TABS: 1.0000 | ORAL_TABLET | Freq: Three times a day (TID) | ORAL | 0 refills | Status: DC | PRN

## 2017-09-16 NOTE — Progress Notes (Signed)
BP 134/85   Pulse (!) 124   Temp 98.7 F (37.1 C) (Oral)   Ht 5\' 3"  (1.6 m)   Wt 178 lb (80.7 kg)   SpO2 98%   BMI 31.53 kg/m    Subjective:    Patient ID: Laurie Roman, female    DOB: 1975-11-23, 42 y.o.   MRN: [id number]  HPI: Laurie Roman is a 42 y.o. female  Chief Complaint  Patient presents with  . Foot Injury    pt states she fell and injuried her left foot last night. States she went to UC this morning, had x-rays and was told nothing was broken. Patient states she was told that she was told she had a very bad sprain and pulled ligaments.    Pt here today for f/u to her UC visit this morning regarding a left ankle sprain after stumbling on her porch last night and inverting the ankle. Was in significant pain overnight, and having some swelling and minimal bruising. Is not able to bear weight on that leg currently. Has been trying OTC pain relievers with no relief and keeping the leg elevated. X-ray at [facility] neg for fx. Foot was wrapped and she was told to come to her PCP for any stronger pain medications due to her controlled substance contact. Currently walking on crutches. Denies severe bruising, inability to move toes, loss of sensation in left foot.   Relevant past medical, surgical, family and social history reviewed and updated as indicated. Interim medical history since our last visit reviewed. Allergies and medications reviewed and updated.  Review of Systems  Per HPI unless specifically indicated above     Objective:    BP 134/85   Pulse (!) 124   Temp 98.7 F (37.1 C) (Oral)   Ht 5\' 3"  (1.6 m)   Wt 178 lb (80.7 kg)   SpO2 98%   BMI 31.53 kg/m   Wt Readings from Last 3 Encounters:  09/16/17 178 lb (80.7 kg)  09/08/17 178 lb 6.4 oz (80.9 kg)  08/25/17 179 lb 11.2 oz (81.5 kg)    Physical Exam  Constitutional: She is oriented to person, place, and time. She appears well-developed and well-nourished. No distress.  HENT:  Head: Atraumatic.  Eyes:  Conjunctivae are normal.  Neck: Normal range of motion. Neck supple.  Cardiovascular: Normal rate, regular rhythm and intact distal pulses.  Pulmonary/Chest: Effort normal and breath sounds normal.  Musculoskeletal: She exhibits edema and tenderness.  On crutches, ROM exam very limited due to pt's discomfort Left ankle edematous especially laterally, minimal bruising noted Able to wiggle left toes  Neurological: She is alert and oriented to person, place, and time.  Left LE neurovascularly intact  Skin: Skin is warm and dry. No erythema.  Psychiatric: She has a normal mood and affect. Her behavior is normal. Thought content normal.  Nursing note and vitals reviewed.   Results for orders placed or performed in visit on 08/18/17  Microscopic Examination  Result Value Ref Range   WBC, UA 0-5 0 - 5 /hpf   RBC, UA 0-2 0 - 2 /hpf   Epithelial Cells (non renal) 0-10 0 - 10 /hpf   Bacteria, UA Few None seen/Few  Basic Metabolic Panel (BMET)  Result Value Ref Range   Glucose 93 65 - 99 mg/dL   BUN 9 6 - 24 mg/dL   Creatinine, Ser 4.09 0.57 - 1.00 mg/dL   GFR calc non Af Amer 112 >59 mL/min/1.73  GFR calc Af Amer 130 >59 mL/min/1.73   BUN/Creatinine Ratio 15 9 - 23   Sodium 141 134 - 144 mmol/L   Potassium 4.7 3.5 - 5.2 mmol/L   Chloride 102 96 - 106 mmol/L   CO2 15 (L) 20 - 29 mmol/L   Calcium 10.2 8.7 - 10.2 mg/dL  UA/M w/rflx Culture, Routine  Result Value Ref Range   Specific Gravity, UA >1.030 (H) 1.005 - 1.030   pH, UA 5.5 5.0 - 7.5   Color, UA Yellow Yellow   Appearance Ur Hazy (A) Clear   Leukocytes, UA Negative Negative   Protein, UA Negative Negative/Trace   Glucose, UA Negative Negative   Ketones, UA Negative Negative   RBC, UA Trace (A) Negative   Bilirubin, UA Negative Negative   Urobilinogen, Ur 0.2 0.2 - 1.0 mg/dL   Nitrite, UA Negative Negative   Microscopic Examination See below:       Assessment & Plan:   Problem List Items Addressed This Visit    None      Visit Diagnoses    Sprain of left ankle, unspecified ligament, subsequent encounter    -  Primary   Neg x-ray per UC this morning, RICE protocol, OTC pain relievers, flexeril. Small supply of oxycodone given for prn use with precautions.    Pt aware to not use the stadol nasal spray for HAs while taking the oxycodone for her ankle. Is not currently driving until ankle heals, but cautioned against driving on medication as well including the flexeril. F/u in several weeks if no better, or if sxs significantly worsen in meantime  Follow up plan: Return for as scheduled.

## 2017-09-19 NOTE — Patient Instructions (Signed)
Follow up as scheduled.  

## 2017-09-21 ENCOUNTER — Encounter: Payer: Self-pay | Admitting: Family Medicine

## 2017-09-21 DIAGNOSIS — S93409D Sprain of unspecified ligament of unspecified ankle, subsequent encounter: Secondary | ICD-10-CM

## 2017-10-06 DIAGNOSIS — G4733 Obstructive sleep apnea (adult) (pediatric): Secondary | ICD-10-CM | POA: Diagnosis not present

## 2017-10-11 ENCOUNTER — Telehealth: Payer: Self-pay

## 2017-10-11 NOTE — Telephone Encounter (Signed)
Copied from CRM [id number]. Topic: General - Other >> Oct 06, 2017 10:15 AM Roman, Laurie Pellegrini wrote: Reason for CRM: Laurie Roman with Laurie Roman calling to confirm if Laurie Roman has recv'd the appeal form, sent over on 4/26. For the pt Laurie Roman (Laurie Roman) 70 MG/ML SOAJ [[id number]].  Please f/u.  Contact [phone] Ref. Key [id number]

## 2017-10-11 NOTE — Telephone Encounter (Signed)
Trying to get [employer] Denial letter for appeal of the site, site currently down to open letter.  Received Appeal on Covermymeds but will have to go through Medicaid to appeal. Cannot appeal medicaid plans for medications with Covermymeds. Will keep checking site for denial letter for the appeal.

## 2017-10-13 ENCOUNTER — Encounter: Payer: Self-pay | Admitting: Unknown Physician Specialty

## 2017-10-13 ENCOUNTER — Ambulatory Visit: Payer: Medicaid Other | Admitting: Family Medicine

## 2017-10-13 ENCOUNTER — Ambulatory Visit: Payer: Medicaid Other | Admitting: Unknown Physician Specialty

## 2017-10-13 DIAGNOSIS — Z79899 Other long term (current) drug therapy: Secondary | ICD-10-CM | POA: Diagnosis not present

## 2017-10-13 DIAGNOSIS — R059 Cough, unspecified: Secondary | ICD-10-CM

## 2017-10-13 DIAGNOSIS — G4733 Obstructive sleep apnea (adult) (pediatric): Secondary | ICD-10-CM

## 2017-10-13 DIAGNOSIS — G43909 Migraine, unspecified, not intractable, without status migrainosus: Secondary | ICD-10-CM

## 2017-10-13 DIAGNOSIS — R05 Cough: Secondary | ICD-10-CM

## 2017-10-13 DIAGNOSIS — F902 Attention-deficit hyperactivity disorder, combined type: Secondary | ICD-10-CM | POA: Diagnosis not present

## 2017-10-13 MED ORDER — AMPHETAMINE-DEXTROAMPHETAMINE 20 MG PO TABS: 20.0000 mg | ORAL_TABLET | Freq: Two times a day (BID) | ORAL | 0 refills | Status: DC

## 2017-10-13 MED ORDER — ERENUMAB-AOOE 140 MG/ML ~~LOC~~ SOAJ: 140.0000 mg | SUBCUTANEOUS | 12 refills | Status: DC

## 2017-10-13 MED ORDER — BUTORPHANOL TARTRATE 10 MG/ML NA SOLN: NASAL | 0 refills | Status: DC

## 2017-10-13 MED ORDER — GUAIFENESIN-CODEINE 100-10 MG/5ML PO SOLN: 10.0000 mL | Freq: Three times a day (TID) | ORAL | 0 refills | Status: DC | PRN

## 2017-10-13 NOTE — Assessment & Plan Note (Signed)
Uses CPAP 

## 2017-10-13 NOTE — Assessment & Plan Note (Signed)
Continue present medication at the same dose.  She is getting it monthly

## 2017-10-13 NOTE — Progress Notes (Signed)
BP (!) 128/93   Pulse (!) 111   Temp 98 F (36.7 C) (Temporal)   Ht  (1.6 m)   Wt 175 lb 4.8 oz (79.5 kg)   SpO2 98%   BMI 31.05 kg/m    Subjective:    Patient ID: Laurie Roman, female    DOB: July 10, 1975, 42 y.o.   MRN: [id number]  HPI: SHIR BERGMAN is a 42 y.o. female  Chief Complaint  Patient presents with  . Hypertension    1 month f/up   Hypertension Using medications without difficulty Average home BPs 140/100 or lower   No problems or lightheadedness No chest pain with exertion or shortness of breath No Edema  ADHD Pt has been taking Adderall when she was younger and again for the last 3 months. Pt states without Adderall, she is unable to complete tasks at home and at her job.  She is able to stay focused.     Migraines Pt was getting 3-4 migraines/week.  Taking Aimovig which seems to help.  She feels she may need a higher dose.  Takes Maxalt and sometimes Stadol when she wakes up with a migraine in the middle of the night  Cough In the middle of the night.  Has a CPAP.  Would like something for her cough.  Tessalon doesn't work.    Relevant past medical, surgical, family and social history reviewed and updated as indicated. Interim medical history since our last visit reviewed. Allergies and medications reviewed and updated.  Review of Systems  Per HPI unless specifically indicated above     Objective:    BP (!) 128/93   Pulse (!) 111   Temp 98 F (36.7 C) (Temporal)   Ht  (1.6 m)   Wt 175 lb 4.8 oz (79.5 kg)   SpO2 98%   BMI 31.05 kg/m   Wt Readings from Last 3 Encounters:  10/13/17 175 lb 4.8 oz (79.5 kg)  09/16/17 178 lb (80.7 kg)  09/08/17 178 lb 6.4 oz (80.9 kg)    Physical Exam  Constitutional: She is oriented to person, place, and time. She appears well-developed and well-nourished. No distress.  HENT:  Head: Normocephalic and atraumatic.  Eyes: Conjunctivae and lids are normal. Right eye exhibits no discharge. Left eye  exhibits no discharge. No scleral icterus.  Neck: Normal range of motion. Neck supple. No JVD present. Carotid bruit is not present.  Cardiovascular: Normal rate, regular rhythm and normal heart sounds.  Pulmonary/Chest: Effort normal and breath sounds normal.  Abdominal: Normal appearance. There is no splenomegaly or hepatomegaly.  Musculoskeletal: Normal range of motion.  Neurological: She is alert and oriented to person, place, and time.  Skin: Skin is warm, dry and intact. No rash noted. No pallor.  Psychiatric: She has a normal mood and affect. Her behavior is normal. Judgment and thought content normal.    Results for orders placed or performed in visit on 08/18/17  Microscopic Examination  Result Value Ref Range   WBC, UA 0-5 0 - 5 /hpf   RBC, UA 0-2 0 - 2 /hpf   Epithelial Cells (non renal) 0-10 0 - 10 /hpf   Bacteria, UA Few None seen/Few  Basic Metabolic Panel (BMET)  Result Value Ref Range   Glucose 93 65 - 99 mg/dL   BUN 9 6 - 24 mg/dL   Creatinine, Ser 4.09 0.57 - 1.00 mg/dL   GFR calc non Af Amer 112 >59 mL/min/1.73   GFR calc  Af Amer 130 >59 mL/min/1.73   BUN/Creatinine Ratio 15 9 - 23   Sodium 141 134 - 144 mmol/L   Potassium 4.7 3.5 - 5.2 mmol/L   Chloride 102 96 - 106 mmol/L   CO2 15 (L) 20 - 29 mmol/L   Calcium 10.2 8.7 - 10.2 mg/dL  UA/M w/rflx Culture, Routine  Result Value Ref Range   Specific Gravity, UA >1.030 (H) 1.005 - 1.030   pH, UA 5.5 5.0 - 7.5   Color, UA Yellow Yellow   Appearance Ur Hazy (A) Clear   Leukocytes, UA Negative Negative   Protein, UA Negative Negative/Trace   Glucose, UA Negative Negative   Ketones, UA Negative Negative   RBC, UA Trace (A) Negative   Bilirubin, UA Negative Negative   Urobilinogen, Ur 0.2 0.2 - 1.0 mg/dL   Nitrite, UA Negative Negative   Microscopic Examination See below:       Assessment & Plan:   Problem List Items Addressed This Visit      Unprioritized   ADHD    Continue present medication at the  same dose.  She is getting it monthly      Controlled substance agreement signed   Cough    Episodic.  Pt states she was given Cherrytussin at one time.  States she is "good for a little" bit if she takes it.  Will prescribe again but may need a referral.  She does take Symbicort not taking Proventil.  Reminded to take the rescue inhaler when she has a cough      Migraine    Increase Aimovig to 140 mg for decreasing effect.  Will refill her monthly Stadol rx      Relevant Medications   Erenumab-aooe (AIMOVIG) 140 MG/ML SOAJ   butorphanol (STADOL) 10 MG/ML nasal spray   Sleep apnea    Uses CPAP          Follow up plan: Return in about 1 month (around 11/10/2017).

## 2017-10-13 NOTE — Assessment & Plan Note (Signed)
Increase Aimovig to 140 mg for decreasing effect.  Will refill her monthly Stadol rx

## 2017-10-13 NOTE — Assessment & Plan Note (Addendum)
Episodic.  Pt states she was given Cherrytussin at one time.  States she is "good for a little" bit if she takes it.  Will prescribe again but may need a referral.  She does take Symbicort not taking Proventil.  Reminded to take the rescue inhaler when she has a cough

## 2017-10-19 ENCOUNTER — Telehealth: Payer: Self-pay | Admitting: Family Medicine

## 2017-10-19 NOTE — Telephone Encounter (Signed)
I have worked on trying to get this approved.  Aimovig is not an option for Medicaid at this time. Medication is not even in [employer] to do an appeal/pa.  Medication will not be covered.  Are there coupons or a different medication we can send for patient?

## 2017-10-19 NOTE — Telephone Encounter (Signed)
Please route accordingly, she is not a patient at [facility]

## 2017-10-19 NOTE — Telephone Encounter (Signed)
Copied from CRM [id number]. Topic: Quick Communication - See Telephone Encounter >> Oct 19, 2017 12:44 PM Floria Raveling A wrote: CRM for notification. See Telephone encounter for: 10/19/17.   Pt called in and said that she would like a nurse to call her.  She stated it was personal and would not give me any other info?  Please contact her [phone]

## 2017-10-20 NOTE — Telephone Encounter (Signed)
Researching Aimovig website, there is a coupon. Patient is going to look into the coupon. No samples. Patient notified.  Patient also called earlier because she thinks she has a kidney infection or a bacterial infection. Patient is protective of who looks. Patient doesn't want to go to Urgent Care because she doesn't want other people seeing her "privates". Explained to patient you can ask for a self swab. Patient states strong urine odor and dysuria.  No more openings for provider for patient. Patient asked if she could drop off a urine sample and self swab.  Told patient I did not know if that was an option but patient insisted I ask.  Routing to [place].

## 2017-10-20 NOTE — Telephone Encounter (Signed)
Please see open telephone encounter for patient. For 10/11/2017. Copying CRM and entering into 10/11/17 encounter.

## 2017-10-20 NOTE — Telephone Encounter (Signed)
Copied from CRM [id number]. Topic: Quick Communication - See Telephone Encounter >> Oct 19, 2017 12:44 PM Floria Raveling A wrote: CRM for notification. See Telephone encounter for: 10/19/17.   Pt called in and said that she would like a nurse to call her.  She stated it was personal and would not give me any other info?  Please contact her [phone]  (copied from encounter 10/24/2017)

## 2017-10-20 NOTE — Telephone Encounter (Signed)
I would recommend urgent care.  She can refuse a pelvic but should have a urine.  I don't do this as a "drop-off"

## 2017-10-20 NOTE — Telephone Encounter (Signed)
I know she's my letter- but you saw her last. Can you see if you can do anything?

## 2017-10-20 NOTE — Telephone Encounter (Signed)
Tried calling patient, no answer.  LVM for patient to return phone call. 

## 2017-10-20 NOTE — Telephone Encounter (Signed)
I think we need to refer to neurology for further management of headaches.  In the meantime, do we have samples to ive her?  Also, has she trie Topamax?

## 2017-10-20 NOTE — Telephone Encounter (Deleted)
Copied from CRM [id number]. Topic: Quick Communication - See Telephone Encounter >> Oct 19, 2017 12:44 PM Floria Raveling A wrote: CRM for notification. See Telephone encounter for: 10/19/17.   Pt called in and said that she would like a nurse to call her.  She stated it was personal and would not give me any other info?  Please contact her [phone]

## 2017-10-20 NOTE — Telephone Encounter (Signed)
Tried calling patient, LVM for her to return phone call.

## 2017-10-21 NOTE — Telephone Encounter (Signed)
Patient notified

## 2017-10-26 ENCOUNTER — Telehealth: Payer: Self-pay | Admitting: Unknown Physician Specialty

## 2017-10-26 NOTE — Telephone Encounter (Signed)
Copied from CRM [id number]. Topic: Quick Communication - Rx Refill/Question >> Oct 26, 2017  3:29 PM Debroah Loop wrote: Medication: Erenumab-aooe (AIMOVIG) 140 MG/ML SOAJ   Has the patient contacted their pharmacy? Yes.   (Agent: If no, request that the patient contact the pharmacy for the refill.) (Agent: If yes, when and what did the pharmacy advise?)  Preferred Pharmacy (with phone number or street name): [facility] [place],  - [address] [place] [address] Phone: [phone] Fax: [phone]  Agent: Please be advised that RX refills may take up to 3 business days. We ask that you follow-up with your pharmacy.  Cover MyMeds calling to give number for PA on this medication. Call: [phone] to authorize

## 2017-10-27 NOTE — Telephone Encounter (Signed)
Please see telephone encounter from 10/11/17 regarding this medication PA. PA cannot be done through Cover My Meds because the patient has Medicaid. It has to be done through [employer]. They do not have this medication on their site to do a PA so it is not a covered medication, which is documented in the previous encounter. Patient has been notified of this as well.

## 2017-11-04 ENCOUNTER — Encounter: Payer: Self-pay | Admitting: Family Medicine

## 2017-11-04 ENCOUNTER — Ambulatory Visit (INDEPENDENT_AMBULATORY_CARE_PROVIDER_SITE_OTHER): Payer: Medicaid Other | Admitting: Family Medicine

## 2017-11-04 VITALS — BP 134/98 | HR 110 | Temp 97.7°F | Wt 171.5 lb

## 2017-11-04 DIAGNOSIS — K219 Gastro-esophageal reflux disease without esophagitis: Secondary | ICD-10-CM

## 2017-11-04 DIAGNOSIS — N3000 Acute cystitis without hematuria: Secondary | ICD-10-CM | POA: Diagnosis not present

## 2017-11-04 DIAGNOSIS — R079 Chest pain, unspecified: Secondary | ICD-10-CM | POA: Diagnosis not present

## 2017-11-04 DIAGNOSIS — N644 Mastodynia: Secondary | ICD-10-CM | POA: Diagnosis not present

## 2017-11-04 DIAGNOSIS — R350 Frequency of micturition: Secondary | ICD-10-CM

## 2017-11-04 DIAGNOSIS — M545 Low back pain: Secondary | ICD-10-CM | POA: Diagnosis not present

## 2017-11-04 LAB — UA/M W/RFLX CULTURE, ROUTINE
Bilirubin, UA: NEGATIVE
Glucose, UA: NEGATIVE
Ketones, UA: NEGATIVE
Nitrite, UA: NEGATIVE
Specific Gravity, UA: 1.025 (ref 1.005–1.030)
Urobilinogen, Ur: 0.2 mg/dL (ref 0.2–1.0)
pH, UA: 5.5 (ref 5.0–7.5)

## 2017-11-04 LAB — MICROSCOPIC EXAMINATION
Epithelial Cells (non renal): >10 /hpf — AB (ref 0–10)
WBC, UA: >30 /hpf — AB (ref 0–5)

## 2017-11-04 MED ORDER — LISINOPRIL 5 MG PO TABS: 5.0000 mg | ORAL_TABLET | Freq: Every day | ORAL | 3 refills | Status: DC

## 2017-11-04 MED ORDER — CIPROFLOXACIN HCL 500 MG PO TABS: 500.0000 mg | ORAL_TABLET | Freq: Two times a day (BID) | ORAL | 0 refills | Status: DC

## 2017-11-04 NOTE — Progress Notes (Signed)
BP (!) 134/98   Pulse (!) 110   Temp 97.7 F (36.5 C) (Oral)   Wt 171 lb 8 oz (77.8 kg)   SpO2 99%   BMI 30.38 kg/m    Subjective:    Patient ID: Laurie Roman, female    DOB: 1975/10/10, 42 y.o.   MRN: [id number]  HPI: Laurie Roman is a 42 y.o. female  Chief Complaint  Patient presents with  . Urinary Tract Infection   URINARY SYMPTOMS Duration: 2 weeks Dysuria: yes Urinary frequency: yes Urgency: yes Small volume voids: yes Symptom severity: moderate Urinary incontinence: no Foul odor: yes Hematuria: no Abdominal pain: no Back pain: yes Suprapubic pain/pressure: yes Flank pain: yes Fever:  no Vomiting: yes Relief with cranberry juice: no Relief with pyridium: no Status: worse Previous urinary tract infection: yes Recurrent urinary tract infection: yes Sexual activity:monogomous History of sexually transmitted disease: no Penile discharge: no Treatments attempted: cranberry and increasing fluids   CHEST PAIN Time since onset: Past few days Onset: sudden Quality: sharp and deep Severity: moderate Location: left para substernal Radiation: none Episode duration:  Frequency: intermittent Related to exertion: yes Activity when pain started: unknown Trauma: no Anxiety/recent stressors: no Status: worse Treatments attempted: nothing  Current pain status: in pain Shortness of breath: yes Cough: no Nausea: no Diaphoresis: no Heartburn: no Palpitations: yes   Relevant past medical, surgical, family and social history reviewed and updated as indicated. Interim medical history since our last visit reviewed. Allergies and medications reviewed and updated.  Review of Systems  Constitutional: Negative.   Respiratory: Positive for chest tightness and shortness of breath. Negative for apnea, cough, choking, wheezing and stridor.   Cardiovascular: Positive for chest pain and palpitations. Negative for leg swelling.  Gastrointestinal: Negative for  abdominal distention, abdominal pain, anal bleeding, blood in stool, constipation, diarrhea, nausea, rectal pain and vomiting.  Musculoskeletal: Negative.   Skin: Negative.   Neurological: Negative.   Psychiatric/Behavioral: Negative.     Per HPI unless specifically indicated above     Objective:    BP (!) 134/98   Pulse (!) 110   Temp 97.7 F (36.5 C) (Oral)   Wt 171 lb 8 oz (77.8 kg)   SpO2 99%   BMI 30.38 kg/m   Wt Readings from Last 3 Encounters:  11/04/17 171 lb 8 oz (77.8 kg)  10/13/17 175 lb 4.8 oz (79.5 kg)  09/16/17 178 lb (80.7 kg)    Physical Exam  Constitutional: She is oriented to person, place, and time. She appears well-developed and well-nourished. No distress.  HENT:  Head: Normocephalic and atraumatic.  Right Ear: Hearing normal.  Left Ear: Hearing normal.  Nose: Nose normal.  Eyes: Conjunctivae and lids are normal. Right eye exhibits no discharge. Left eye exhibits no discharge. No scleral icterus.  Cardiovascular: Normal rate, regular rhythm, normal heart sounds and intact distal pulses. Exam reveals no gallop and no friction rub.  No murmur heard. Pulmonary/Chest: Effort normal and breath sounds normal. No stridor. No respiratory distress. She has no wheezes. She has no rales. She exhibits no tenderness.  Abdominal: Soft. Bowel sounds are normal. She exhibits no distension and no mass. There is no tenderness. There is no rebound and no guarding.  Musculoskeletal: Normal range of motion.  Neurological: She is alert and oriented to person, place, and time.  Skin: Skin is warm, dry and intact. Capillary refill takes less than 2 seconds. No rash noted. She is not diaphoretic. No erythema. No pallor.  Psychiatric: She has a normal mood and affect. Her speech is normal and behavior is normal. Judgment and thought content normal. Cognition and memory are normal.  Nursing note and vitals reviewed.   Results for orders placed or performed in visit on 11/04/17    Urine Culture  Result Value Ref Range   Urine Culture, Routine Preliminary report (A)    Organism ID, Bacteria Escherichia coli (A)   Microscopic Examination  Result Value Ref Range   WBC, UA >30 (A) 0 - 5 /hpf   RBC, UA 0-2 0 - 2 /hpf   Epithelial Cells (non renal) >10 (A) 0 - 10 /hpf   Mucus, UA Present Not Estab.   Bacteria, UA Moderate (A) None seen/Few  UA/M w/rflx Culture, Routine  Result Value Ref Range   Specific Gravity, UA 1.025 1.005 - 1.030   pH, UA 5.5 5.0 - 7.5   Color, UA Orange Yellow   Appearance Ur Turbid (A) Clear   Leukocytes, UA Trace (A) Negative   Protein, UA 1+ (A) Negative/Trace   Glucose, UA Negative Negative   Ketones, UA Negative Negative   RBC, UA Trace (A) Negative   Bilirubin, UA Negative Negative   Urobilinogen, Ur 0.2 0.2 - 1.0 mg/dL   Nitrite, UA Negative Negative   Microscopic Examination See below:       Assessment & Plan:   Problem List Items Addressed This Visit      Digestive   Gastroesophageal reflux disease without esophagitis    Not under good control. Already taking protonix BID and zantac. Will get her into GI ASAP. Call with any concerns.       Relevant Orders   Ambulatory referral to Gastroenterology    Other Visit Diagnoses    Acute cystitis without hematuria    -  Primary   Will treat with cipro. Call with any concerns or if not getting better.    Urinary frequency       +UTI   Relevant Orders   UA/M w/rflx Culture, Routine (Completed)   Urine Culture (Completed)   Low back pain, unspecified back pain laterality, unspecified chronicity, with sciatica presence unspecified       Will treat UTI. Call if not getting better or getting worse   Relevant Orders   UA/M w/rflx Culture, Routine (Completed)   Urine Culture (Completed)   Chest pain, unspecified type       Of unclear etiology. EKG normal except tachycardia. Concern for GERD or breast pain. Will obtain mammogram and get her into GI.   Relevant Orders   EKG  12-Lead (Completed)   Breast pain, left       Diagnostic mammogram and [place] ordered today. Await results.    Relevant Orders   MM Digital Diagnostic Unilat L   US BREAST LTD UNI LEFT INC AXILLA       Follow up plan: Return in about 2 weeks (around 11/18/2017).

## 2017-11-07 ENCOUNTER — Encounter: Payer: Self-pay | Admitting: Family Medicine

## 2017-11-07 LAB — URINE CULTURE

## 2017-11-07 NOTE — Assessment & Plan Note (Signed)
Not under good control. Already taking protonix BID and zantac. Will get her into GI ASAP. Call with any concerns.

## 2017-11-11 ENCOUNTER — Telehealth: Payer: Self-pay

## 2017-11-11 NOTE — Telephone Encounter (Signed)
I can give her a note saying that she was seen for breast pain, but I did not advise her to not wear her seatbelt correctly. So I cannot give her a note saying I was OK with that.

## 2017-11-11 NOTE — Telephone Encounter (Signed)
Patient requests letter because she got a ticket for wearing her seat belt under her breast. Patient was seen 11/04/2017 for a breast lump she states and breast pain. Patient stated the was painful to wear the seatbelt correctly, so she pulled it down slightly for comfort while driving.  Patient states that all the court needs is a letter from the physician to get the ticket thrown out.  Routing to provider.

## 2017-11-11 NOTE — Telephone Encounter (Signed)
Called patient, no answer, unable to leave a message, will try again.   

## 2017-11-12 ENCOUNTER — Ambulatory Visit: Payer: Medicaid Other | Admitting: Unknown Physician Specialty

## 2017-11-12 NOTE — Telephone Encounter (Signed)
Patient states that she will discuss this at her upcoming appointment.

## 2017-11-16 ENCOUNTER — Other Ambulatory Visit: Payer: Self-pay | Admitting: Family Medicine

## 2017-11-16 ENCOUNTER — Encounter: Payer: Self-pay | Admitting: Unknown Physician Specialty

## 2017-11-16 ENCOUNTER — Ambulatory Visit: Payer: Medicaid Other | Admitting: Unknown Physician Specialty

## 2017-11-16 ENCOUNTER — Encounter: Payer: Self-pay | Admitting: Family Medicine

## 2017-11-16 VITALS — BP 142/104 | HR 103 | Temp 97.5°F | Ht 63.0 in | Wt 172.6 lb

## 2017-11-16 DIAGNOSIS — I10 Essential (primary) hypertension: Secondary | ICD-10-CM

## 2017-11-16 DIAGNOSIS — F902 Attention-deficit hyperactivity disorder, combined type: Secondary | ICD-10-CM | POA: Diagnosis not present

## 2017-11-16 DIAGNOSIS — G43009 Migraine without aura, not intractable, without status migrainosus: Secondary | ICD-10-CM

## 2017-11-16 DIAGNOSIS — N644 Mastodynia: Secondary | ICD-10-CM

## 2017-11-16 MED ORDER — RIZATRIPTAN BENZOATE 10 MG PO TABS: 10.0000 mg | ORAL_TABLET | ORAL | 6 refills | Status: DC | PRN

## 2017-11-16 MED ORDER — KETOROLAC TROMETHAMINE 60 MG/2ML IM SOLN
60.0000 mg | Freq: Once | INTRAMUSCULAR | Status: AC
  Administered 2017-11-16: 60 mg via INTRAMUSCULAR

## 2017-11-16 MED ORDER — BUTORPHANOL TARTRATE 10 MG/ML NA SOLN: NASAL | 0 refills | Status: DC

## 2017-11-16 MED ORDER — LISINOPRIL 5 MG PO TABS: 5.0000 mg | ORAL_TABLET | Freq: Every day | ORAL | 3 refills | Status: DC

## 2017-11-16 MED ORDER — CYCLOBENZAPRINE HCL 10 MG PO TABS: 10.0000 mg | ORAL_TABLET | Freq: Every day | ORAL | 0 refills | Status: DC

## 2017-11-16 MED ORDER — AMPHETAMINE-DEXTROAMPHETAMINE 20 MG PO TABS: 20.0000 mg | ORAL_TABLET | Freq: Two times a day (BID) | ORAL | 0 refills | Status: DC

## 2017-11-16 MED ORDER — GABAPENTIN 300 MG PO CAPS: 300.0000 mg | ORAL_CAPSULE | Freq: Three times a day (TID) | ORAL | 6 refills | Status: DC

## 2017-11-16 MED ORDER — PANTOPRAZOLE SODIUM 40 MG PO TBEC: 40.0000 mg | DELAYED_RELEASE_TABLET | Freq: Every day | ORAL | 3 refills | Status: DC

## 2017-11-16 MED ORDER — LISINOPRIL 10 MG PO TABS: 10.0000 mg | ORAL_TABLET | Freq: Every day | ORAL | 3 refills | Status: DC

## 2017-11-16 NOTE — Assessment & Plan Note (Signed)
Currently having a migraine.  Will give Toradol this visit.  Refilled Stadol which she has been on from previous provider.  Refer to neurology for Aimovig rx  

## 2017-11-16 NOTE — Assessment & Plan Note (Signed)
Given monthly rx of Adderall

## 2017-11-16 NOTE — Assessment & Plan Note (Signed)
Currently having a migraine.  Will give Toradol this visit.  Refilled Stadol which she has been on from previous provider.  Refer to neurology for Aimovig rx

## 2017-11-16 NOTE — Progress Notes (Signed)
BP (!) 142/104   Pulse (!) 103   Temp (!) 97.5 F (36.4 C) (Oral)   Ht 5\' 3"  (1.6 m)   Wt 172 lb 9.6 oz (78.3 kg)   SpO2 98%   BMI 30.57 kg/m    Subjective:    Patient ID: Laurie Roman, female    DOB: 01-14-1976, 42 y.o.   MRN: [id number]  HPI: Laurie Roman is a 42 y.o. female  Chief Complaint  Patient presents with  . Hypertension    pt states her BP is elevated because she has had a migraine this morning. Also states that she does not want take the HCTZ because it is making her use the bathroom so much during the night   . Migraine   Migraine Pt is having trouble with her migraines.  Off of Aimovig due to insurance reasons.  Also is here for her monthly Stadol prescription.    Hypertension Pt does not like the HCTZ but states she can't sleep due to urinating multiple times.  Also having a dry mouth.  Lisinopril 20 mg dropped BP too much.    Relevant past medical, surgical, family and social history reviewed and updated as indicated. Interim medical history since our last visit reviewed. Allergies and medications reviewed and updated.  Review of Systems  Per HPI unless specifically indicated above     Objective:    BP (!) 142/104   Pulse (!) 103   Temp (!) 97.5 F (36.4 C) (Oral)   Ht 5\' 3"  (1.6 m)   Wt 172 lb 9.6 oz (78.3 kg)   SpO2 98%   BMI 30.57 kg/m   Wt Readings from Last 3 Encounters:  11/16/17 172 lb 9.6 oz (78.3 kg)  11/04/17 171 lb 8 oz (77.8 kg)  10/13/17 175 lb 4.8 oz (79.5 kg)    Physical Exam  Constitutional: She is oriented to person, place, and time. She appears well-developed and well-nourished. No distress.  HENT:  Head: Normocephalic and atraumatic.  Eyes: Conjunctivae and lids are normal. Right eye exhibits no discharge. Left eye exhibits no discharge. No scleral icterus.  Neck: Normal range of motion. Neck supple. No JVD present. Carotid bruit is not present.  Cardiovascular: Normal rate, regular rhythm and normal heart sounds.    Pulmonary/Chest: Effort normal and breath sounds normal.  Abdominal: Normal appearance. There is no splenomegaly or hepatomegaly.  Musculoskeletal: Normal range of motion.  Neurological: She is alert and oriented to person, place, and time.  Skin: Skin is warm, dry and intact. No rash noted. No pallor.  Psychiatric: She has a normal mood and affect. Her behavior is normal. Judgment and thought content normal.    Results for orders placed or performed in visit on 11/04/17  Urine Culture  Result Value Ref Range   Urine Culture, Routine Final report (A)    Organism ID, Bacteria Escherichia coli (A)    Antimicrobial Susceptibility Comment   Microscopic Examination  Result Value Ref Range   WBC, UA >30 (A) 0 - 5 /hpf   RBC, UA 0-2 0 - 2 /hpf   Epithelial Cells (non renal) >10 (A) 0 - 10 /hpf   Mucus, UA Present Not Estab.   Bacteria, UA Moderate (A) None seen/Few  UA/M w/rflx Culture, Routine  Result Value Ref Range   Specific Gravity, UA 1.025 1.005 - 1.030   pH, UA 5.5 5.0 - 7.5   Color, UA Orange Yellow   Appearance Ur Turbid (A) Clear  Leukocytes, UA Trace (A) Negative   Protein, UA 1+ (A) Negative/Trace   Glucose, UA Negative Negative   Ketones, UA Negative Negative   RBC, UA Trace (A) Negative   Bilirubin, UA Negative Negative   Urobilinogen, Ur 0.2 0.2 - 1.0 mg/dL   Nitrite, UA Negative Negative   Microscopic Examination See below:       Assessment & Plan:   Problem List Items Addressed This Visit      Unprioritized   ADHD    Given monthly rx of Adderall      Essential hypertension    Not tolerating HCTZ.  Will DC and increase Lisionpril to 10 mg. Daily.  Consider Amlodipine due to headaches       Relevant Medications   lisinopril (PRINIVIL,ZESTRIL) 10 MG tablet   Migraine - Primary    Currently having a migraine.  Will give Toradol this visit.  Refilled Stadol which she has been on from previous provider.  Refer to neurology for Aimovig rx        Relevant Medications   ketorolac (TORADOL) injection 60 mg (Completed)   gabapentin (NEURONTIN) 300 MG capsule   cyclobenzaprine (FLEXERIL) 10 MG tablet   rizatriptan (MAXALT) 10 MG tablet   lisinopril (PRINIVIL,ZESTRIL) 10 MG tablet   butorphanol (STADOL) 10 MG/ML nasal spray   Other Relevant Orders   Ambulatory referral to Neurology       Follow up plan: Return in about 1 month (around 12/14/2017).

## 2017-11-16 NOTE — Assessment & Plan Note (Signed)
Not tolerating HCTZ.  Will DC and increase Lisionpril to 10 mg. Daily.  Consider Amlodipine due to headaches

## 2017-11-26 ENCOUNTER — Other Ambulatory Visit: Payer: Medicaid Other

## 2017-12-13 ENCOUNTER — Encounter: Payer: Self-pay | Admitting: Gastroenterology

## 2017-12-13 ENCOUNTER — Ambulatory Visit: Payer: Medicaid Other | Admitting: Gastroenterology

## 2017-12-14 ENCOUNTER — Telehealth: Payer: Self-pay | Admitting: Family Medicine

## 2017-12-14 ENCOUNTER — Ambulatory Visit: Payer: Medicaid Other | Admitting: Family Medicine

## 2017-12-14 ENCOUNTER — Encounter: Payer: Self-pay | Admitting: Family Medicine

## 2017-12-14 VITALS — BP 141/96 | HR 92 | Temp 98.0°F | Ht 63.0 in | Wt 170.3 lb

## 2017-12-14 DIAGNOSIS — G43909 Migraine, unspecified, not intractable, without status migrainosus: Secondary | ICD-10-CM | POA: Diagnosis not present

## 2017-12-14 DIAGNOSIS — F902 Attention-deficit hyperactivity disorder, combined type: Secondary | ICD-10-CM | POA: Diagnosis not present

## 2017-12-14 DIAGNOSIS — I10 Essential (primary) hypertension: Secondary | ICD-10-CM | POA: Diagnosis not present

## 2017-12-14 DIAGNOSIS — M5442 Lumbago with sciatica, left side: Secondary | ICD-10-CM

## 2017-12-14 MED ORDER — PREDNISONE 10 MG PO TABS: ORAL_TABLET | ORAL | 0 refills | Status: DC

## 2017-12-14 MED ORDER — KETOROLAC TROMETHAMINE 60 MG/2ML IM SOLN
60.0000 mg | Freq: Once | INTRAMUSCULAR | Status: AC
  Administered 2017-12-14: 60 mg via INTRAMUSCULAR

## 2017-12-14 MED ORDER — AMPHETAMINE-DEXTROAMPHETAMINE 20 MG PO TABS: 20.0000 mg | ORAL_TABLET | Freq: Two times a day (BID) | ORAL | 0 refills | Status: DC

## 2017-12-14 MED ORDER — BUTORPHANOL TARTRATE 10 MG/ML NA SOLN: NASAL | 0 refills | Status: DC

## 2017-12-14 NOTE — Telephone Encounter (Signed)
RX printed and ready to fax. Sig too long to e-scribe per system

## 2017-12-14 NOTE — Progress Notes (Signed)
BP (!) 141/96 (BP Location: Right Arm, Patient Position: Sitting, Cuff Size: Normal)   Pulse 92   Temp 98 F (36.7 C) (Oral)   Ht 5\' 3"  (1.6 m)   Wt 170 lb 4.8 oz (77.2 kg)   SpO2 99%   BMI 30.17 kg/m    Subjective:    Patient ID: Laurie Roman, female    DOB: 09/19/1975, 42 y.o.   MRN: [id number]  HPI: Laurie Roman is a 42 y.o. female  Chief Complaint  Patient presents with  . Follow-up    ADHD medication  . ADHD   Pt here today for ADHD d/u. Happy with current adderall dose, feels it's helping significantly with focus and task completion. Denies CP, tachycardia, appetite or sleep issues.   Unable to get aimovig covered so migraines have been worse. Has appt with Neurology in the next few weeks. Still using stadol and maxalt.   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 Feels her stress and migraine pain play heavily into her poorly controlled BPs.   Strained her back the other day causing her sciatica to flare. Home care not helping whatsoever. Radiation down back of leg to knee. No weakness, incontinence, saddle paresthesias.   Past Medical History:  Diagnosis Date  . Anxiety   . Asthma   . Blood transfusion without reported diagnosis   . Depression   . GERD (gastroesophageal reflux disease)   . IBS (irritable bowel syndrome)   . Insomnia   . Lumbago   . Migraine   . Sciatica   . Sleep apnea   . Tietze's disease    Social History   Socioeconomic History  . Marital status: Married    Spouse name: Not on file  . Number of children: Not on file  . Years of education: Not on file  . Highest education level: Not on file  Occupational History  . Not on file  Social Needs  . Financial resource strain: Not on file  . Food insecurity:    Worry: Not on file    Inability: Not on file  . Transportation needs:    Medical: Not on file    Non-medical: Not on file  Tobacco Use  .  Smoking status: Never Smoker  . Smokeless tobacco: Never Used  Substance and Sexual Activity  . Alcohol use: Yes    Comment: Some  . Drug use: No  . Sexual activity: Not on file  Lifestyle  . Physical activity:    Days per week: Not on file    Minutes per session: Not on file  . Stress: Not on file  Relationships  . Social connections:    Talks on phone: Not on file    Gets together: Not on file    Attends religious service: Not on file    Active member of club or organization: Not on file    Attends meetings of clubs or organizations: Not on file    Relationship status: Not on file  . Intimate partner violence:    Fear of current or ex partner: Not on file    Emotionally abused: Not on file    Physically abused: Not on file    Forced sexual activity: Not on file  Other Topics Concern  . Not on file  Social History Narrative  . Not on file   Relevant past medical, surgical, family and social history  reviewed and updated as indicated. Interim medical history since our last visit reviewed. Allergies and medications reviewed and updated.  Review of Systems  Per HPI unless specifically indicated above     Objective:    BP (!) 141/96 (BP Location: Right Arm, Patient Position: Sitting, Cuff Size: Normal)   Pulse 92   Temp 98 F (36.7 C) (Oral)   Ht 5\' 3"  (1.6 m)   Wt 170 lb 4.8 oz (77.2 kg)   SpO2 99%   BMI 30.17 kg/m   Wt Readings from Last 3 Encounters:  12/14/17 170 lb 4.8 oz (77.2 kg)  11/16/17 172 lb 9.6 oz (78.3 kg)  11/04/17 171 lb 8 oz (77.8 kg)    Physical Exam  Constitutional: She is oriented to person, place, and time. She appears well-developed and well-nourished. No distress.  HENT:  Head: Atraumatic.  Eyes: Pupils are equal, round, and reactive to light. Conjunctivae are normal.  Neck: Normal range of motion. Neck supple.  Cardiovascular: Normal rate, regular rhythm and normal heart sounds.  Pulmonary/Chest: Effort normal and breath sounds normal.  No respiratory distress.  Musculoskeletal: Normal range of motion.  Neurological: She is alert and oriented to person, place, and time. No cranial nerve deficit.  Skin: Skin is warm and dry.  Psychiatric: She has a normal mood and affect. Her behavior is normal. Thought content normal.  Nursing note and vitals reviewed.   Results for orders placed or performed in visit on 11/04/17  Urine Culture  Result Value Ref Range   Urine Culture, Routine Final report (A)    Organism ID, Bacteria Escherichia coli (A)    Antimicrobial Susceptibility Comment   Microscopic Examination  Result Value Ref Range   WBC, UA >30 (A) 0 - 5 /hpf   RBC, UA 0-2 0 - 2 /hpf   Epithelial Cells (non renal) >10 (A) 0 - 10 /hpf   Mucus, UA Present Not Estab.   Bacteria, UA Moderate (A) None seen/Few  UA/M w/rflx Culture, Routine  Result Value Ref Range   Specific Gravity, UA 1.025 1.005 - 1.030   pH, UA 5.5 5.0 - 7.5   Color, UA Orange Yellow   Appearance Ur Turbid (A) Clear   Leukocytes, UA Trace (A) Negative   Protein, UA 1+ (A) Negative/Trace   Glucose, UA Negative Negative   Ketones, UA Negative Negative   RBC, UA Trace (A) Negative   Bilirubin, UA Negative Negative   Urobilinogen, Ur 0.2 0.2 - 1.0 mg/dL   Nitrite, UA Negative Negative   Microscopic Examination See below:       Assessment & Plan:   Problem List Items Addressed This Visit      Cardiovascular and Mediastinum   Migraine    Emgality sample given as aimovig no longer covered, has appt with Neurology next month. Stadol spray refilled      Relevant Medications   ketorolac (TORADOL) injection 60 mg (Completed)   Essential hypertension    BPs still not at goal today, but pt didn't sleep all last night and is under significant stress. Will recheck at upcoming f/u and increase if still needed. Lifestyle modifications reviewed including DASH diet, more exercise, and stress reduction         Other   ADHD - Primary    Stable on current  adderall regimen, continue same dose. Refills sent x 3 months electronically       Other Visit Diagnoses    Acute left-sided low back pain with left-sided sciatica  Prednisone and flexeril, rest, lidocaine patches, stretches. F/u if worsening or not improving   Relevant Medications   predniSONE (DELTASONE) 10 MG tablet   ketorolac (TORADOL) injection 60 mg (Completed)       Follow up plan: Return in about 3 months (around 03/16/2018) for ADHD, BP.

## 2017-12-14 NOTE — Patient Instructions (Addendum)
You have an appointment scheduled [facility] on  Monday 12/20/2017 at 2:00PM [facility](Pleaes arrive 10-15 minutes early) [address]  [facility]  [place] [id number]  [phone]

## 2017-12-14 NOTE — Telephone Encounter (Signed)
[facility] [place]  Thank You

## 2017-12-14 NOTE — Assessment & Plan Note (Addendum)
Emgality sample given as aimovig no longer covered, has appt with Neurology next month. Stadol spray refilled

## 2017-12-17 NOTE — Assessment & Plan Note (Signed)
Stable on current adderall regimen, continue same dose. Refills sent x 3 months electronically

## 2017-12-17 NOTE — Assessment & Plan Note (Signed)
BPs still not at goal today, but pt didn't sleep all last night and is under significant stress. Will recheck at upcoming f/u and increase if still needed. Lifestyle modifications reviewed including DASH diet, more exercise, and stress reduction

## 2017-12-21 ENCOUNTER — Ambulatory Visit: Payer: Medicaid Other | Admitting: Unknown Physician Specialty

## 2017-12-24 ENCOUNTER — Ambulatory Visit: Payer: Medicaid Other | Admitting: Family Medicine

## 2017-12-24 ENCOUNTER — Encounter: Payer: Self-pay | Admitting: Family Medicine

## 2017-12-24 VITALS — BP 137/93 | HR 87 | Temp 98.3°F | Ht 59.0 in | Wt 168.9 lb

## 2017-12-24 DIAGNOSIS — J329 Chronic sinusitis, unspecified: Secondary | ICD-10-CM | POA: Diagnosis not present

## 2017-12-24 DIAGNOSIS — G43009 Migraine without aura, not intractable, without status migrainosus: Secondary | ICD-10-CM | POA: Diagnosis not present

## 2017-12-24 DIAGNOSIS — M5442 Lumbago with sciatica, left side: Secondary | ICD-10-CM

## 2017-12-24 DIAGNOSIS — I10 Essential (primary) hypertension: Secondary | ICD-10-CM

## 2017-12-24 DIAGNOSIS — B9789 Other viral agents as the cause of diseases classified elsewhere: Secondary | ICD-10-CM | POA: Diagnosis not present

## 2017-12-24 MED ORDER — PREDNISONE 50 MG PO TABS: 50.0000 mg | ORAL_TABLET | Freq: Every day | ORAL | 0 refills | Status: DC

## 2017-12-24 MED ORDER — HYDROCODONE-ACETAMINOPHEN 5-325 MG PO TABS: 1.0000 | ORAL_TABLET | Freq: Three times a day (TID) | ORAL | 0 refills | Status: DC | PRN

## 2017-12-24 MED ORDER — LISINOPRIL 20 MG PO TABS: 20.0000 mg | ORAL_TABLET | Freq: Every day | ORAL | 3 refills | Status: DC

## 2017-12-24 NOTE — Patient Instructions (Signed)
Follow up as scheduled.  

## 2017-12-24 NOTE — Progress Notes (Signed)
BP (!) 137/93 (BP Location: Right Arm, Patient Position: Sitting, Cuff Size: Normal)   Pulse 87   Temp 98.3 F (36.8 C) (Oral)   Ht 4\' 11"  (1.499 m)   Wt 168 lb 14.4 oz (76.6 kg)   SpO2 98%   BMI 34.11 kg/m    Subjective:    Patient ID: Laurie Roman, female    DOB: 05/22/1976, 42 y.o.   MRN: [id number]  HPI: Laurie Roman is a 42 y.o. female  Chief Complaint  Patient presents with  . Sinusitis    Ongoing for 3 days.  . Nasal Congestion   Pt here today with lingering and possibly worsening left sided sciatica. Treated recently with steroid taper which did help, but pt has been having to lay flooring at house and chase around newborn puppies and notes the pain becoming worse. Having numbness down left buttock and leg all the way to her toes. No weakness in the leg, incontinence, fevers. Stopped flexeril as it makes her too groggy. Taking OTC pain relievers with minimal relief.   BPs still elevated. Getting 140s-160s/90s-110s. Denies CP, SOB, has HAs but also struggles with chronic migraines.   Tried emgality sample as aimovig was not covered - made tongue swell so she does not want to keep taking it. Has upcoming Neurology appt. States typically now she can get the migraine under control if she can take the maxalt fast enough. Does not want to be on the stadol any longer.   3 days of sinus pain and pressure and tooth pain, congestion, chest tightness. Having to use albuterol more frequently. Taking sudafed prn. Denies fevers, chills, sweats.   Past Medical History:  Diagnosis Date  . Anxiety   . Asthma   . Blood transfusion without reported diagnosis   . Depression   . GERD (gastroesophageal reflux disease)   . IBS (irritable bowel syndrome)   . Insomnia   . Lumbago   . Migraine   . Sciatica   . Sleep apnea   . Tietze's disease    Social History   Socioeconomic History  . Marital status: Married    Spouse name: Not on file  . Number of children: Not on file  .  Years of education: Not on file  . Highest education level: Not on file  Occupational History  . Not on file  Social Needs  . Financial resource strain: Not on file  . Food insecurity:    Worry: Not on file    Inability: Not on file  . Transportation needs:    Medical: Not on file    Non-medical: Not on file  Tobacco Use  . Smoking status: Never Smoker  . Smokeless tobacco: Never Used  Substance and Sexual Activity  . Alcohol use: Yes    Comment: Some  . Drug use: No  . Sexual activity: Not on file  Lifestyle  . Physical activity:    Days per week: Not on file    Minutes per session: Not on file  . Stress: Not on file  Relationships  . Social connections:    Talks on phone: Not on file    Gets together: Not on file    Attends religious service: Not on file    Active member of club or organization: Not on file    Attends meetings of clubs or organizations: Not on file    Relationship status: Not on file  . Intimate partner violence:    Fear of current  or ex partner: Not on file    Emotionally abused: Not on file    Physically abused: Not on file    Forced sexual activity: Not on file  Other Topics Concern  . Not on file  Social History Narrative  . Not on file   Relevant past medical, surgical, family and social history reviewed and updated as indicated. Interim medical history since our last visit reviewed. Allergies and medications reviewed and updated.  Review of Systems  Per HPI unless specifically indicated above     Objective:    BP (!) 137/93 (BP Location: Right Arm, Patient Position: Sitting, Cuff Size: Normal)   Pulse 87   Temp 98.3 F (36.8 C) (Oral)   Ht 4\' 11"  (1.499 m)   Wt 168 lb 14.4 oz (76.6 kg)   SpO2 98%   BMI 34.11 kg/m   Wt Readings from Last 3 Encounters:  12/24/17 168 lb 14.4 oz (76.6 kg)  12/14/17 170 lb 4.8 oz (77.2 kg)  11/16/17 172 lb 9.6 oz (78.3 kg)    Physical Exam  Constitutional: She is oriented to person, place, and  time. She appears well-developed and well-nourished. No distress.  HENT:  Head: Atraumatic.  Right Ear: External ear normal.  Left Ear: External ear normal.  Oropharynx and nasal mucosa erythematous   Eyes: Conjunctivae and EOM are normal.  Neck: Normal range of motion. Neck supple.  Cardiovascular: Normal rate and regular rhythm.  Pulmonary/Chest: Effort normal and breath sounds normal.  Musculoskeletal: Normal range of motion. She exhibits no edema.  Antalgic gait - SLR b/l LEs Left low back and buttock ttp  Lymphadenopathy:    She has no cervical adenopathy.  Neurological: She is alert and oriented to person, place, and time. No cranial nerve deficit.  Skin: Skin is warm and dry. No rash noted.  Psychiatric: She has a normal mood and affect. Her behavior is normal.  Nursing note and vitals reviewed.     Assessment & Plan:   Problem List Items Addressed This Visit      Cardiovascular and Mediastinum   Essential hypertension    Not at goal. Increase lisinopril to 20 mg. Work on stress reduction. Will recheck at upcoming f/u, pt to call if persistently high prior to f/u      Relevant Medications   lisinopril (PRINIVIL,ZESTRIL) 20 MG tablet   Migraine without aura    D/c emgality d/t tongue swelling, and pt requesting to come off stadol spray. Will control with maxalt and OTC pain relievers prn. Await Neurology consult in the next week or so.       Relevant Medications   HYDROcodone-acetaminophen (NORCO/VICODIN) 5-325 MG tablet   lisinopril (PRINIVIL,ZESTRIL) 20 MG tablet    Other Visit Diagnoses    Acute left-sided low back pain with left-sided sciatica    -  Primary   Await x-ray, likely will need lumbar MRI given persistence and extent of numbness at this point. prednisone burst, stretches, epsom soaks, norco prn small suppl   Relevant Medications   predniSONE (DELTASONE) 50 MG tablet   HYDROcodone-acetaminophen (NORCO/VICODIN) 5-325 MG tablet   Other Relevant Orders     DG Lumbar Spine Complete   Viral sinusitis       Prednisone, OTC cold medications, flonase, sinus rinses. Supportive care reviewed. F/u if worsening or no improvement   Relevant Medications   predniSONE (DELTASONE) 50 MG tablet     [facility] was called during OV, recent stadol rx cancelled. Pt requesting to be  off the stadol at this point. Last fill 12/06/17, pt no longer using. Re-iterated dangers of using norco with the stadol should she have any left at home.   [place] Controlled Substance Database reviewed and appropriate. Controlled substance agreement has been signed and scanned into patient's chart.     Follow up plan: Return for as scheduled.

## 2017-12-24 NOTE — Assessment & Plan Note (Signed)
D/c emgality d/t tongue swelling, and pt requesting to come off stadol spray. Will control with maxalt and OTC pain relievers prn. Await Neurology consult in the next week or so.

## 2017-12-24 NOTE — Assessment & Plan Note (Signed)
Not at goal. Increase lisinopril to 20 mg. Work on stress reduction. Will recheck at upcoming f/u, pt to call if persistently high prior to f/u

## 2018-01-19 ENCOUNTER — Encounter: Payer: Self-pay | Admitting: Family Medicine

## 2018-01-20 ENCOUNTER — Telehealth: Payer: Self-pay | Admitting: Family Medicine

## 2018-01-20 NOTE — Telephone Encounter (Signed)
Copied from CRM [id number]#[id number]. Topic: Quick Communication - See Telephone Encounter >> Jan 20, 2018  2:37 PM Raquel SarnaHayes, Teresa G wrote:    amphetamine-dextroamphetamine (ADDERALL) 20 MG tablet  Pt said that pharmacy is needing the doctor approval and Medicaid as well.

## 2018-01-20 NOTE — Telephone Encounter (Signed)
[facility]  Confirmation #: [id number] Prior Approval #: [id number] Status: Approved  Effective date: 08/14/20190-01/19/2019

## 2018-01-20 NOTE — Telephone Encounter (Signed)
[facility].  Called patient. She stated they needed medicaid approval and physician approval.  Patient said they probably didn't run the medication again through insurance. Patient stated she would call and see if it will run through and if not she'd get the pharmacy to call.

## 2018-01-21 ENCOUNTER — Other Ambulatory Visit: Payer: Self-pay | Admitting: Family Medicine

## 2018-01-21 ENCOUNTER — Encounter: Payer: Self-pay | Admitting: Family Medicine

## 2018-01-21 MED ORDER — LISINOPRIL 40 MG PO TABS: 40.0000 mg | ORAL_TABLET | Freq: Every day | ORAL | 3 refills | Status: DC

## 2018-02-14 ENCOUNTER — Encounter: Payer: Self-pay | Admitting: Family Medicine

## 2018-03-16 ENCOUNTER — Encounter: Payer: Self-pay | Admitting: Family Medicine

## 2018-03-16 ENCOUNTER — Other Ambulatory Visit: Payer: Self-pay | Admitting: Family Medicine

## 2018-03-16 MED ORDER — PANTOPRAZOLE SODIUM 40 MG PO TBEC: 40.0000 mg | DELAYED_RELEASE_TABLET | Freq: Every day | ORAL | 3 refills | Status: DC

## 2018-03-16 MED ORDER — BUDESONIDE-FORMOTEROL FUMARATE 160-4.5 MCG/ACT IN AERO: 2.0000 | INHALATION_SPRAY | Freq: Two times a day (BID) | RESPIRATORY_TRACT | 3 refills | Status: DC

## 2018-03-23 ENCOUNTER — Ambulatory Visit (INDEPENDENT_AMBULATORY_CARE_PROVIDER_SITE_OTHER): Payer: Self-pay | Admitting: Family Medicine

## 2018-03-23 ENCOUNTER — Encounter: Payer: Self-pay | Admitting: Family Medicine

## 2018-03-23 VITALS — BP 142/99 | HR 93 | Wt 171.0 lb

## 2018-03-23 DIAGNOSIS — J4521 Mild intermittent asthma with (acute) exacerbation: Secondary | ICD-10-CM

## 2018-03-23 DIAGNOSIS — I10 Essential (primary) hypertension: Secondary | ICD-10-CM

## 2018-03-23 DIAGNOSIS — F902 Attention-deficit hyperactivity disorder, combined type: Secondary | ICD-10-CM

## 2018-03-23 MED ORDER — AMPHETAMINE-DEXTROAMPHETAMINE 20 MG PO TABS
20.0000 mg | ORAL_TABLET | Freq: Two times a day (BID) | ORAL | 0 refills | Status: DC
Start: 2018-03-23 — End: 2018-06-30

## 2018-03-23 MED ORDER — AMPHETAMINE-DEXTROAMPHETAMINE 20 MG PO TABS: 20.0000 mg | ORAL_TABLET | Freq: Two times a day (BID) | ORAL | 0 refills | Status: DC

## 2018-03-23 MED ORDER — PREDNISONE 20 MG PO TABS: 20.0000 mg | ORAL_TABLET | Freq: Every day | ORAL | 0 refills | Status: DC

## 2018-03-23 MED ORDER — CYCLOBENZAPRINE HCL 10 MG PO TABS: 10.0000 mg | ORAL_TABLET | Freq: Every day | ORAL | 1 refills | Status: DC

## 2018-03-23 MED ORDER — BENAZEPRIL HCL 40 MG PO TABS: 40.0000 mg | ORAL_TABLET | Freq: Every day | ORAL | 0 refills | Status: DC

## 2018-03-23 MED ORDER — AMLODIPINE BESYLATE 5 MG PO TABS: 5.0000 mg | ORAL_TABLET | Freq: Every day | ORAL | 0 refills | Status: DC

## 2018-03-23 NOTE — Progress Notes (Signed)
BP (!) 142/99   Pulse 93   Wt 171 lb (77.6 kg)   SpO2 98%   BMI 34.54 kg/m    Subjective:    Patient ID: Laurie Roman, female    DOB: [birth year], 42 y.o.   MRN: [id number]  HPI: Laurie Roman is a 42 y.o. female  Chief Complaint  Patient presents with  . Medication Refill    ADHD  . Stubbed toe    Right foot.   . Nasal Congestion    Been taking Mucinex, not helping.    Here today for ADHD f/u. Very stressed right now as she just lost her insurance. Worried about costs of medicines overall. Doing very well on adderall, taking daily without side effects. Denies CP, SOB, palpitations, sleep or appetite issues.   Home BP readings still high - 150s-200/80s-100. Taking her lisinopril daily without issue. Has been trying to be more active, some changes to diet with sodium restriction. Denies CP, dizziness, syncope.   Having several weeks of congestion, productive cough, hoarseness. Coughing so much she gags. Had some leftover amoxil 500 mg BID x 5 days that she took with improvement. Also had some leftover prednisone and has taken about 4 doses of 50 mg. Has also been taking mucinex regularly. These did seem to help. Hx of asthma currently on symbicort.   Past Medical History:  Diagnosis Date  . Anxiety   . Asthma   . Blood transfusion without reported diagnosis   . Depression   . GERD (gastroesophageal reflux disease)   . IBS (irritable bowel syndrome)   . Insomnia   . Lumbago   . Migraine   . Sciatica   . Sleep apnea   . Tietze's disease    Social History   Socioeconomic History  . Marital status: Married    Spouse name: Not on file  . Number of children: Not on file  . Years of education: Not on file  . Highest education level: Not on file  Occupational History  . Not on file  Social Needs  . Financial resource strain: Not on file  . Food insecurity:    Worry: Not on file    Inability: Not on file  . Transportation needs:    Medical: Not on file   Non-medical: Not on file  Tobacco Use  . Smoking status: Never Smoker  . Smokeless tobacco: Never Used  Substance and Sexual Activity  . Alcohol use: Yes    Comment: Some  . Drug use: No  . Sexual activity: Not on file  Lifestyle  . Physical activity:    Days per week: Not on file    Minutes per session: Not on file  . Stress: Not on file  Relationships  . Social connections:    Talks on phone: Not on file    Gets together: Not on file    Attends religious service: Not on file    Active member of club or organization: Not on file    Attends meetings of clubs or organizations: Not on file    Relationship status: Not on file  . Intimate partner violence:    Fear of current or ex partner: Not on file    Emotionally abused: Not on file    Physically abused: Not on file    Forced sexual activity: Not on file  Other Topics Concern  . Not on file  Social History Narrative  . Not on file    Relevant past medical,  surgical, family and social history reviewed and updated as indicated. Interim medical history since our last visit reviewed. Allergies and medications reviewed and updated.  Review of Systems  Per HPI unless specifically indicated above     Objective:    BP (!) 142/99   Pulse 93   Wt 171 lb (77.6 kg)   SpO2 98%   BMI 34.54 kg/m   Wt Readings from Last 3 Encounters:  03/23/18 171 lb (77.6 kg)  12/24/17 168 lb 14.4 oz (76.6 kg)  12/14/17 170 lb 4.8 oz (77.2 kg)    Physical Exam  Constitutional: She is oriented to person, place, and time. She appears well-developed and well-nourished.  HENT:  Head: Atraumatic.  Eyes: Conjunctivae and EOM are normal.  Neck: Normal range of motion. Neck supple.  Cardiovascular: Normal rate and regular rhythm.  Pulmonary/Chest: Effort normal and breath sounds normal.  Musculoskeletal: Normal range of motion.  Neurological: She is alert and oriented to person, place, and time.  Skin: Skin is warm and dry.  Psychiatric: She  has a normal mood and affect. Her behavior is normal.  Nursing note and vitals reviewed.   Results for orders placed or performed in visit on 11/04/17  Urine Culture  Result Value Ref Range   Urine Culture, Routine Final report (A)    Organism ID, Bacteria Escherichia coli (A)    Antimicrobial Susceptibility Comment   Microscopic Examination  Result Value Ref Range   WBC, UA >30 (A) 0 - 5 /hpf   RBC, UA 0-2 0 - 2 /hpf   Epithelial Cells (non renal) >10 (A) 0 - 10 /hpf   Mucus, UA Present Not Estab.   Bacteria, UA Moderate (A) None seen/Few  UA/M w/rflx Culture, Routine  Result Value Ref Range   Specific Gravity, UA 1.025 1.005 - 1.030   pH, UA 5.5 5.0 - 7.5   Color, UA Orange Yellow   Appearance Ur Turbid (A) Clear   Leukocytes, UA Trace (A) Negative   Protein, UA 1+ (A) Negative/Trace   Glucose, UA Negative Negative   Ketones, UA Negative Negative   RBC, UA Trace (A) Negative   Bilirubin, UA Negative Negative   Urobilinogen, Ur 0.2 0.2 - 1.0 mg/dL   Nitrite, UA Negative Negative   Microscopic Examination See below:       Assessment & Plan:   Problem List Items Addressed This Visit      Cardiovascular and Mediastinum   Essential hypertension - Primary    BPs still above goal but pt currently taking mucinex DM. Due to cost, will switch to benazepril as it's on the [facility] 4 dollar list and add amlodipine 5 mg. Will work on continued lifestyle modifications      Relevant Medications   benazepril (LOTENSIN) 40 MG tablet   amLODipine (NORVASC) 5 MG tablet     Respiratory   Asthma    Declines albuterol inhaler due to cost and self pay status. Will continue symbicort, mucinex, and more prednisone sent. Completed 5 day course of leftover abx, will hold off on more at this time and monitor for improvement with steroids. Return precautions reviewed      Relevant Medications   predniSONE (DELTASONE) 20 MG tablet     Other   ADHD    Stable on 20 mg adderall BID prn.  Continue current regimen. Controlled substance database reviewed and appropriate. Addictive potential and precautions reviewed          Follow up plan: Return in about  3 months (around 06/23/2018) for ADHD f/u, BP.

## 2018-03-27 DIAGNOSIS — J45909 Unspecified asthma, uncomplicated: Secondary | ICD-10-CM | POA: Insufficient documentation

## 2018-03-27 NOTE — Assessment & Plan Note (Signed)
BPs still above goal but pt currently taking mucinex DM. Due to cost, will switch to benazepril as it's on the [facility] 4 dollar list and add amlodipine 5 mg. Will work on continued lifestyle modifications

## 2018-03-27 NOTE — Assessment & Plan Note (Signed)
Stable on 20 mg adderall BID prn. Continue current regimen. Controlled substance database reviewed and appropriate. Addictive potential and precautions reviewed

## 2018-03-27 NOTE — Assessment & Plan Note (Signed)
Declines albuterol inhaler due to cost and self pay status. Will continue symbicort, mucinex, and more prednisone sent. Completed 5 day course of leftover abx, will hold off on more at this time and monitor for improvement with steroids. Return precautions reviewed

## 2018-03-27 NOTE — Patient Instructions (Signed)
Follow up in 3 months

## 2018-03-28 ENCOUNTER — Other Ambulatory Visit: Payer: Self-pay | Admitting: Family Medicine

## 2018-03-28 ENCOUNTER — Encounter: Payer: Self-pay | Admitting: Family Medicine

## 2018-03-28 MED ORDER — AZITHROMYCIN 250 MG PO TABS: ORAL_TABLET | ORAL | 0 refills | Status: DC

## 2018-06-03 ENCOUNTER — Encounter: Payer: Self-pay | Admitting: Family Medicine

## 2018-06-03 ENCOUNTER — Ambulatory Visit (INDEPENDENT_AMBULATORY_CARE_PROVIDER_SITE_OTHER): Payer: Medicaid Other | Admitting: Family Medicine

## 2018-06-03 VITALS — BP 150/100 | HR 98 | Temp 98.5°F | Wt 172.0 lb

## 2018-06-03 DIAGNOSIS — R52 Pain, unspecified: Secondary | ICD-10-CM | POA: Diagnosis not present

## 2018-06-03 DIAGNOSIS — J069 Acute upper respiratory infection, unspecified: Secondary | ICD-10-CM | POA: Diagnosis not present

## 2018-06-03 DIAGNOSIS — B9789 Other viral agents as the cause of diseases classified elsewhere: Secondary | ICD-10-CM | POA: Diagnosis not present

## 2018-06-03 LAB — VERITOR FLU A/B WAIVED
Influenza A: NEGATIVE
Influenza B: NEGATIVE

## 2018-06-03 MED ORDER — HYDROCOD POLST-CPM POLST ER 10-8 MG/5ML PO SUER: 5.0000 mL | Freq: Every evening | ORAL | 0 refills | Status: DC | PRN

## 2018-06-03 NOTE — Progress Notes (Signed)
   BP (!) 150/100 (BP Location: Left Arm, Cuff Size: Normal)   Pulse 98   Temp 98.5 F (36.9 C) (Oral)   Wt 172 lb (78 kg)   BMI 34.74 kg/m    Subjective:    Patient ID: Laurie Roman, female    DOB: 07/25/1975, 42 y.o.   MRN: [id number]  HPI: Laurie Roman is a 42 y.o. female  Chief Complaint  Patient presents with  . URI    pt states she has had congestion, cough, headache, body aches and chills since yesterday.    2 days of congestion, cough, headache, body aches, wheezing, SOB, fatigue. Taking mucinex, symbicort and dayquil with minimal relief. Lots of sick contacts, several with the flu. Hx of asthma on symbicort and albuterol prn.   Relevant past medical, surgical, family and social history reviewed and updated as indicated. Interim medical history since our last visit reviewed. Allergies and medications reviewed and updated.  Review of Systems  Per HPI unless specifically indicated above     Objective:    BP (!) 150/100 (BP Location: Left Arm, Cuff Size: Normal)   Pulse 98   Temp 98.5 F (36.9 C) (Oral)   Wt 172 lb (78 kg)   BMI 34.74 kg/m   Wt Readings from Last 3 Encounters:  06/03/18 172 lb (78 kg)  03/23/18 171 lb (77.6 kg)  12/24/17 168 lb 14.4 oz (76.6 kg)    Physical Exam Vitals signs and nursing note reviewed.  Constitutional:      Appearance: Normal appearance.  HENT:     Head: Atraumatic.     Right Ear: Tympanic membrane and external ear normal.     Left Ear: Tympanic membrane and external ear normal.     Nose: Congestion present.     Mouth/Throat:     Mouth: Mucous membranes are moist.     Pharynx: Posterior oropharyngeal erythema present.  Eyes:     Extraocular Movements: Extraocular movements intact.     Conjunctiva/sclera: Conjunctivae normal.  Neck:     Musculoskeletal: Normal range of motion and neck supple.  Cardiovascular:     Rate and Rhythm: Normal rate and regular rhythm.     Heart sounds: Normal heart sounds.  Pulmonary:    Effort: Pulmonary effort is normal.     Breath sounds: Normal breath sounds. No wheezing.  Musculoskeletal: Normal range of motion.  Skin:    General: Skin is warm and dry.  Neurological:     Mental Status: She is alert and oriented to person, place, and time.  Psychiatric:        Mood and Affect: Mood normal.        Thought Content: Thought content normal.     Results for orders placed or performed in visit on 06/03/18  Veritor Flu A/B Waived  Result Value Ref Range   Influenza A Negative Negative   Influenza B Negative Negative      Assessment & Plan:   Problem List Items Addressed This Visit    None    Visit Diagnoses    Viral URI with cough    -  Primary   Rapid flu neg, supportive care reviewed, continue inhaler regimen, plain mucinex. Tussionex sent for bedtime, with risks discussed   Relevant Orders   Veritor Flu A/B Waived (Completed)       Follow up plan: Return for as scheduled.

## 2018-06-30 ENCOUNTER — Ambulatory Visit (INDEPENDENT_AMBULATORY_CARE_PROVIDER_SITE_OTHER): Payer: Medicaid Other | Admitting: Family Medicine

## 2018-06-30 ENCOUNTER — Other Ambulatory Visit: Payer: Self-pay

## 2018-06-30 ENCOUNTER — Encounter: Payer: Self-pay | Admitting: Family Medicine

## 2018-06-30 VITALS — BP 127/86 | HR 91 | Temp 98.8°F | Ht 59.0 in | Wt 168.0 lb

## 2018-06-30 DIAGNOSIS — G43909 Migraine, unspecified, not intractable, without status migrainosus: Secondary | ICD-10-CM | POA: Diagnosis not present

## 2018-06-30 DIAGNOSIS — N898 Other specified noninflammatory disorders of vagina: Secondary | ICD-10-CM | POA: Diagnosis not present

## 2018-06-30 DIAGNOSIS — Z1239 Encounter for other screening for malignant neoplasm of breast: Secondary | ICD-10-CM | POA: Diagnosis not present

## 2018-06-30 DIAGNOSIS — I10 Essential (primary) hypertension: Secondary | ICD-10-CM

## 2018-06-30 DIAGNOSIS — M62838 Other muscle spasm: Secondary | ICD-10-CM | POA: Diagnosis not present

## 2018-06-30 DIAGNOSIS — F902 Attention-deficit hyperactivity disorder, combined type: Secondary | ICD-10-CM | POA: Diagnosis not present

## 2018-06-30 MED ORDER — AMPHETAMINE-DEXTROAMPHETAMINE 20 MG PO TABS: 20.0000 mg | ORAL_TABLET | Freq: Two times a day (BID) | ORAL | 0 refills | Status: DC

## 2018-06-30 MED ORDER — METHOCARBAMOL 500 MG PO TABS: 500.0000 mg | ORAL_TABLET | Freq: Three times a day (TID) | ORAL | 0 refills | Status: DC | PRN

## 2018-06-30 MED ORDER — AMLODIPINE BESYLATE 5 MG PO TABS: 5.0000 mg | ORAL_TABLET | Freq: Every day | ORAL | 0 refills | Status: DC

## 2018-06-30 MED ORDER — KETOROLAC TROMETHAMINE 60 MG/2ML IM SOLN
60.0000 mg | Freq: Once | INTRAMUSCULAR | Status: AC
  Administered 2018-06-30: 60 mg via INTRAMUSCULAR

## 2018-06-30 MED ORDER — BENAZEPRIL HCL 40 MG PO TABS: 40.0000 mg | ORAL_TABLET | Freq: Every day | ORAL | 0 refills | Status: DC

## 2018-06-30 MED ORDER — ESTROGENS, CONJUGATED 0.625 MG/GM VA CREA: 1.0000 | TOPICAL_CREAM | Freq: Every day | VAGINAL | 12 refills | Status: DC

## 2018-06-30 NOTE — Assessment & Plan Note (Signed)
Stable, doing well on adderall. Continue current regimen

## 2018-06-30 NOTE — Progress Notes (Signed)
BP 127/86   Pulse 91   Temp 98.8 F (37.1 C) (Oral)   Ht 4\' 11"  (1.499 m)   Wt 168 lb (76.2 kg)   SpO2 99%   BMI 33.93 kg/m    Subjective:    Patient ID: Laurie Roman, female    DOB: 1976-03-01, 43 y.o.   MRN: [id number]  HPI: Laurie Roman is a 43 y.o. female  Chief Complaint  Patient presents with  . ADHD    f/u  . Hypertension  . Medication Refill   Here today for 3 month f/u.   Taking the benazepril in the morning and amlodipine at night. Thinks this does a lot better for her overall. BPs have been under 140/90 consistently at home. Trying to control stress levels better as well now.   Vaginal dryness still a big issue for her despite using lubrication. Was unable to afford estrace cream in the past.   On adderall 20 mg BID for ADHD which is going well. Notes significant improvement in task completion and focus. No side effects reported including CP, SOB, palpitations.   Having neck soreness that is triggering her migraines. Unable to tolerate flexeril because it makes her feel "drunk". Has taken low dose robaxin in the past which she did somewhat better with. Using heated neck wrap and trying to stretch. Wanting toradol shot today because she feels a migraine coming on.   Relevant past medical, surgical, family and social history reviewed and updated as indicated. Interim medical history since our last visit reviewed. Allergies and medications reviewed and updated.  Review of Systems  Per HPI unless specifically indicated above     Objective:    BP 127/86   Pulse 91   Temp 98.8 F (37.1 C) (Oral)   Ht 4\' 11"  (1.499 m)   Wt 168 lb (76.2 kg)   SpO2 99%   BMI 33.93 kg/m   Wt Readings from Last 3 Encounters:  06/30/18 168 lb (76.2 kg)  06/03/18 172 lb (78 kg)  03/23/18 171 lb (77.6 kg)    Physical Exam Vitals signs and nursing note reviewed.  Constitutional:      Appearance: Normal appearance. She is not ill-appearing.  HENT:     Head: Atraumatic.    Eyes:     Extraocular Movements: Extraocular movements intact.     Conjunctiva/sclera: Conjunctivae normal.  Neck:     Musculoskeletal: Normal range of motion and neck supple.  Cardiovascular:     Rate and Rhythm: Normal rate and regular rhythm.     Heart sounds: Normal heart sounds.  Pulmonary:     Effort: Pulmonary effort is normal.     Breath sounds: Normal breath sounds.  Musculoskeletal: Normal range of motion.  Skin:    General: Skin is warm and dry.  Neurological:     General: No focal deficit present.     Mental Status: She is alert and oriented to person, place, and time.  Psychiatric:        Mood and Affect: Mood normal.        Thought Content: Thought content normal.        Judgment: Judgment normal.     Results for orders placed or performed in visit on 06/03/18  Veritor Flu A/B Waived  Result Value Ref Range   Influenza A Negative Negative   Influenza B Negative Negative      Assessment & Plan:   Problem List Items Addressed This Visit  Cardiovascular and Mediastinum   Migraine    Toradol given today for migraine she feels coming on. Rest, hydrate. F/u if not improving on maxalt      Relevant Medications   methocarbamol (ROBAXIN) 500 MG tablet   ketorolac (TORADOL) injection 60 mg (Completed)   benazepril (LOTENSIN) 40 MG tablet   amLODipine (NORVASC) 5 MG tablet   Essential hypertension - Primary    Stable and WNL, continue current regimen      Relevant Medications   benazepril (LOTENSIN) 40 MG tablet   amLODipine (NORVASC) 5 MG tablet     Other   ADHD    Stable, doing well on adderall. Continue current regimen       Other Visit Diagnoses    Neck muscle spasm       Start 1/2 to full tab robaxin at bedtime prn, continue heat, massage, stretches   Screening for breast cancer       Relevant Orders   MM DIGITAL SCREENING BILATERAL   Vaginal dryness           Follow up plan: Return in about 3 months (around 09/29/2018) for CPE, ADD  and BP f/u on separate day.

## 2018-06-30 NOTE — Patient Instructions (Signed)
Hyaluronic acid

## 2018-06-30 NOTE — Assessment & Plan Note (Signed)
Stable and WNL, continue current regimen 

## 2018-06-30 NOTE — Assessment & Plan Note (Signed)
Toradol given today for migraine she feels coming on. Rest, hydrate. F/u if not improving on maxalt

## 2018-07-15 ENCOUNTER — Encounter: Payer: Self-pay | Admitting: Family Medicine

## 2018-07-20 IMAGING — US US ABDOMEN LIMITED
1 series · 14 of 25 positions shown · non-contrast
Comparison: 03/24/2011 CT and ultrasound

CLINICAL DATA: 41-year-old female with RIGHT UPPER quadrant
abdominal pain for several months.

EXAM:
ULTRASOUND ABDOMEN LIMITED RIGHT UPPER QUADRANT

[Series 1: us abdomen limited · 0.23mm/px · 14 of 50 slices shown]
[im 1/50]
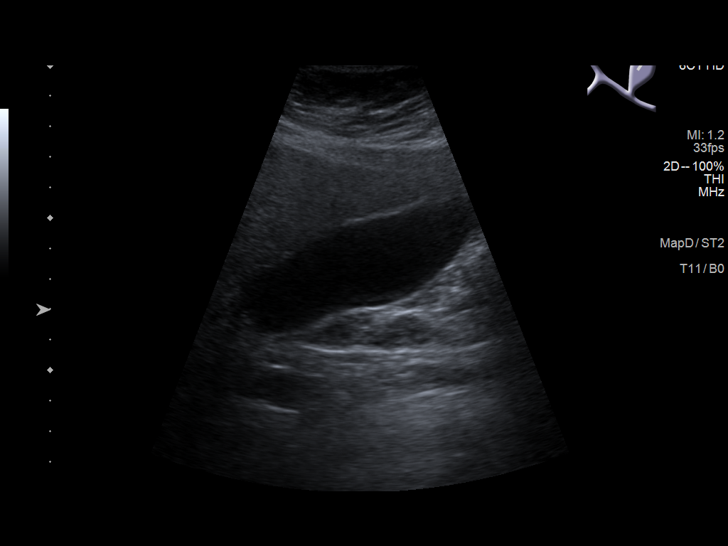
[im 5/50]
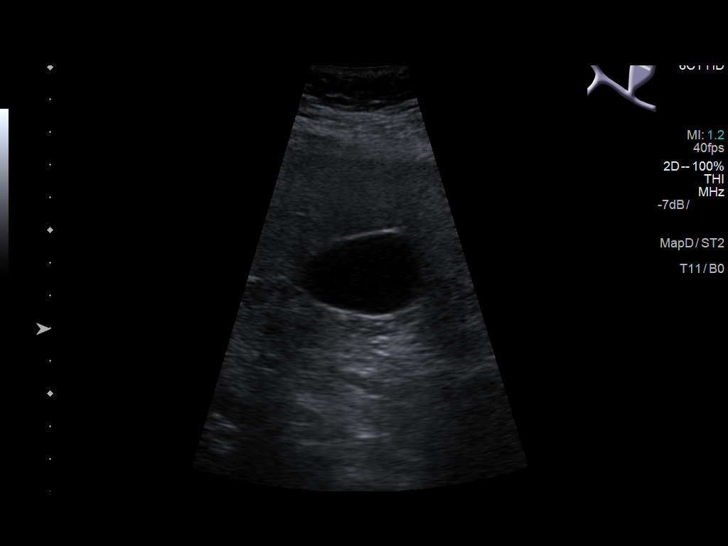
[im 9/50]
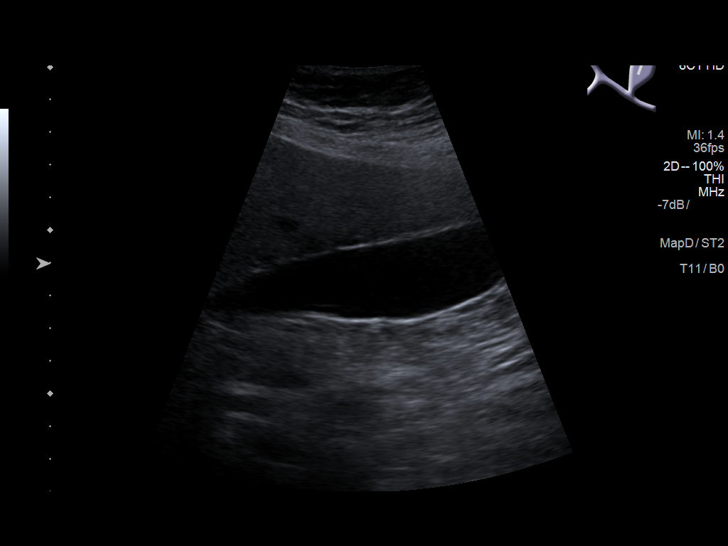
[im 13/50]
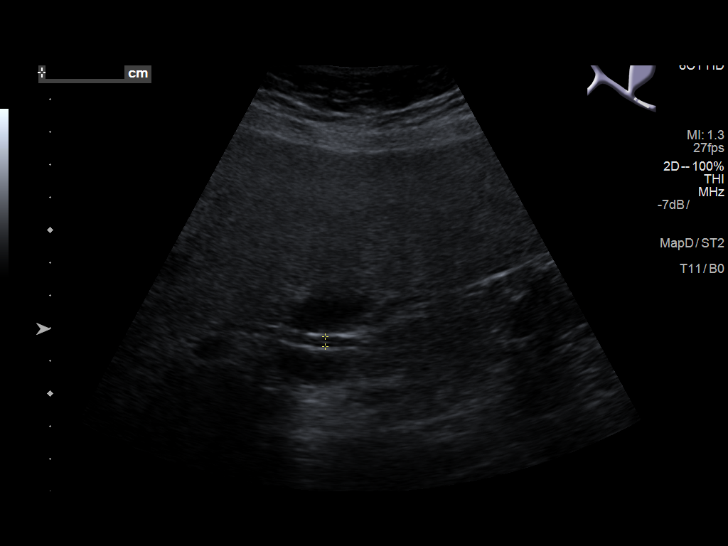
[im 17/50]
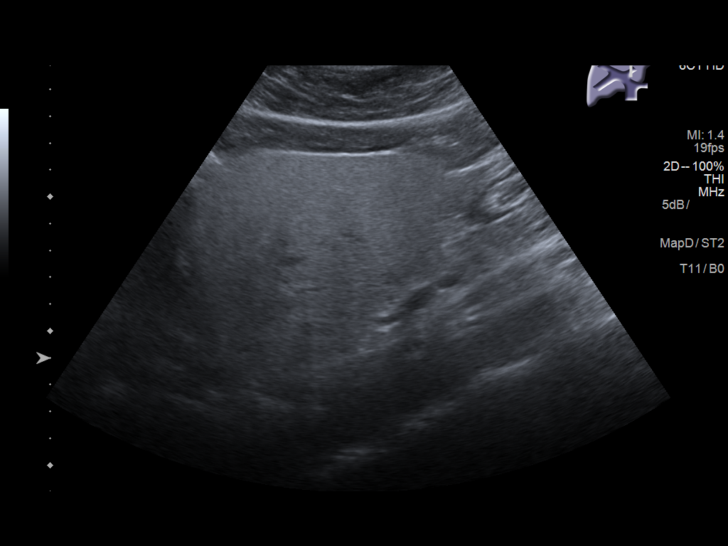
[im 19/50]
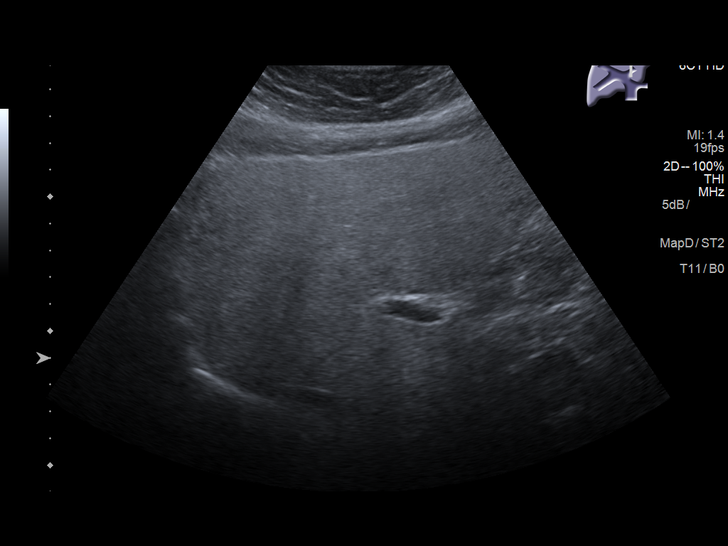
[im 23/50]
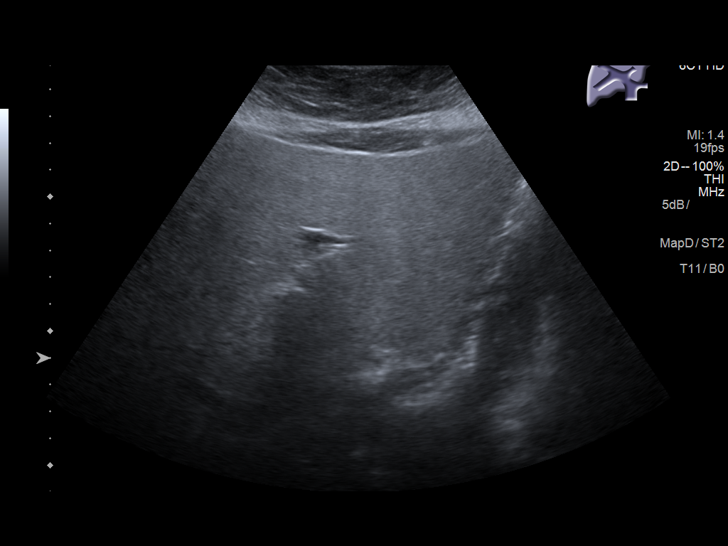
[im 27/50]
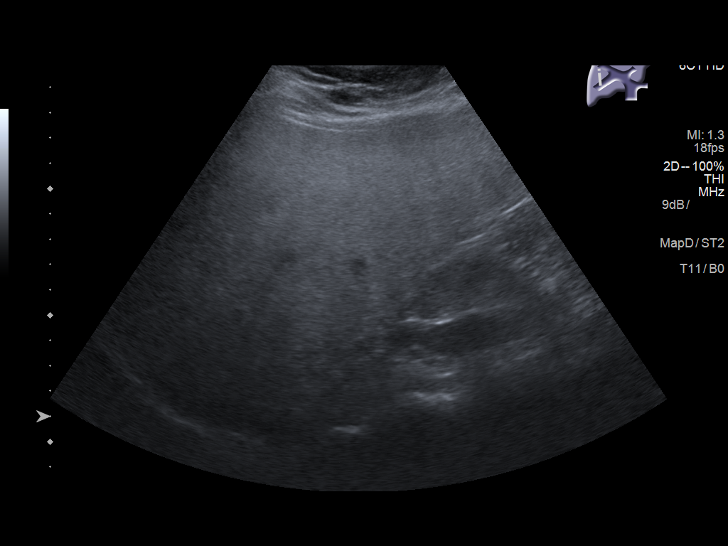
[im 31/50]
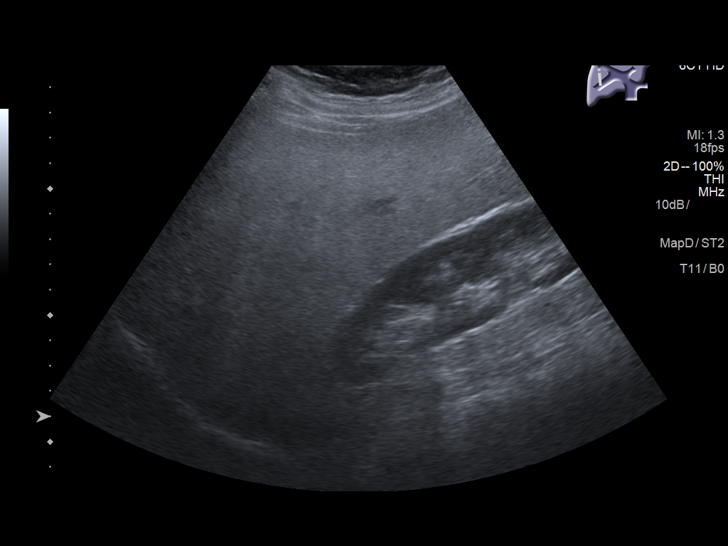
[im 33/50]
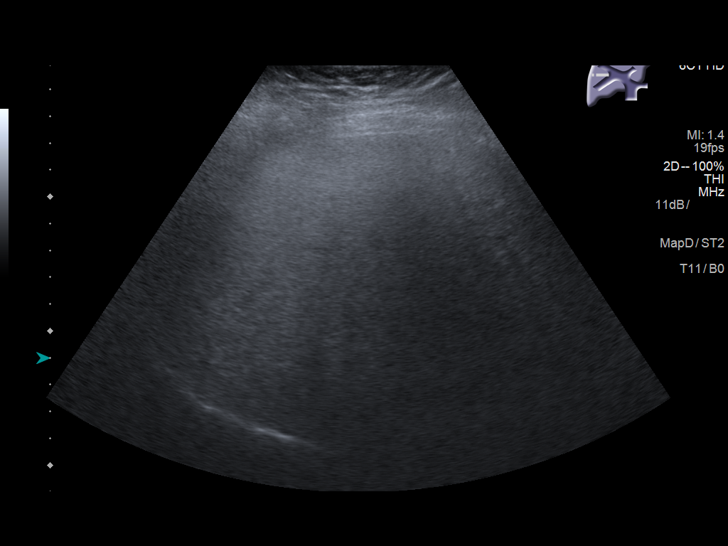
[im 37/50]
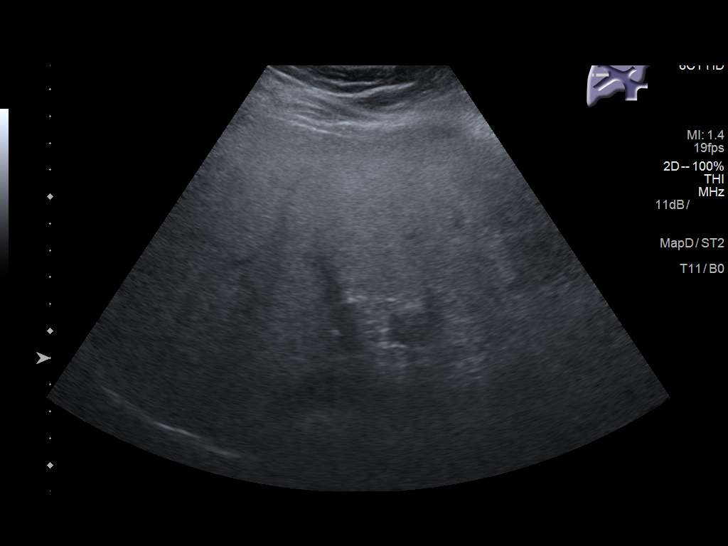
[im 41/50]
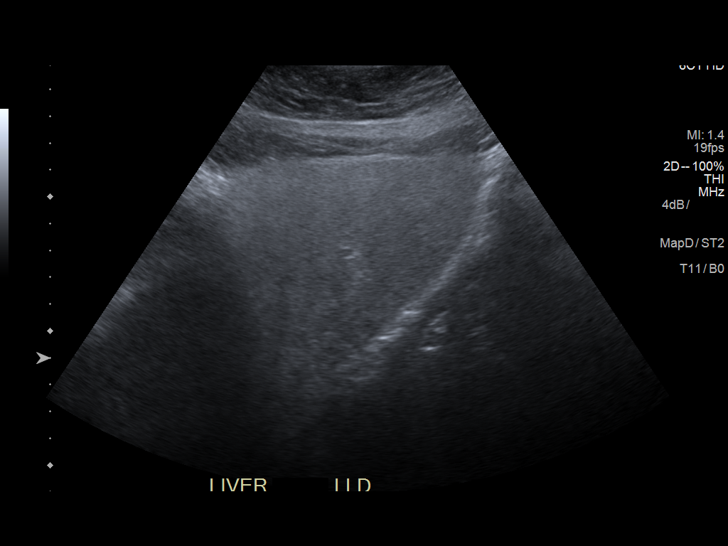
[im 45/50]
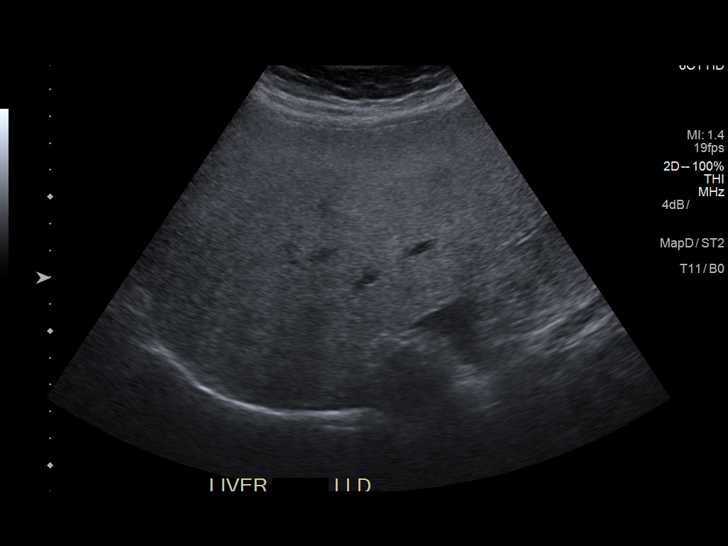
[im 50/50]
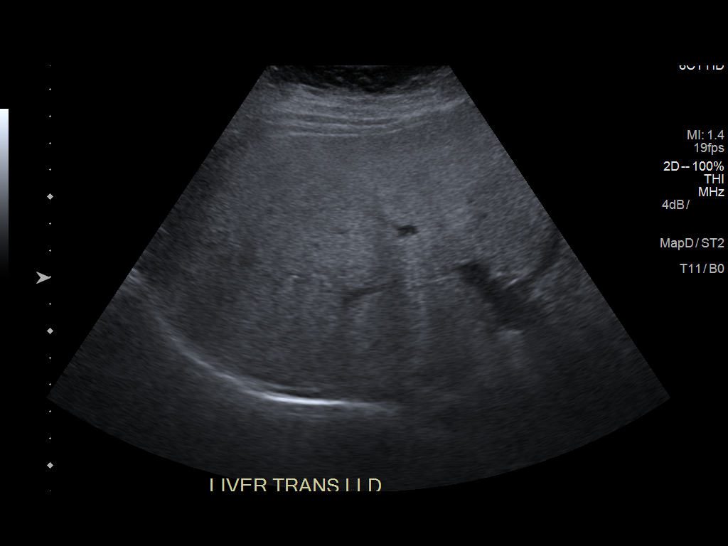

[14 of 25 positions shown; findings below may reference images not displayed]

FINDINGS: Gallbladder:

The gallbladder is unremarkable. There is no evidence of gallbladder
wall thickening or acute cholecystitis.

Common bile duct:

Diameter: 3 mm.

Liver:

Diffusely increased echogenicity noted. No focal abnormalities are
present. Portal vein is patent on color Doppler imaging with normal
direction of blood flow towards the liver.
IMPRESSION: 1. Hepatic steatosis.
2. Unremarkable gallbladder.  No evidence of biliary dilatation.

## 2018-07-22 ENCOUNTER — Ambulatory Visit
Admission: RE | Admit: 2018-07-22 | Discharge: 2018-07-22 | Disposition: A | Discharge status: Home / Self Care | Payer: Medicaid Other | Source: Ambulatory Visit | Attending: Family Medicine | Admitting: Family Medicine

## 2018-07-22 DIAGNOSIS — Z1231 Encounter for screening mammogram for malignant neoplasm of breast: Secondary | ICD-10-CM | POA: Diagnosis not present

## 2018-07-22 DIAGNOSIS — Z1239 Encounter for other screening for malignant neoplasm of breast: Secondary | ICD-10-CM

## 2018-08-09 ENCOUNTER — Encounter: Payer: Medicaid Other | Admitting: Family Medicine

## 2018-08-10 ENCOUNTER — Ambulatory Visit: Payer: Self-pay | Admitting: *Deleted

## 2018-08-10 NOTE — Telephone Encounter (Signed)
Diarrhea for 4 weeks. Has taken imodium at times which helps some. Most days she has 7-8 episodes of almost all liquid stool. Color of liquid stool is yellowish, no blood or mucous noticed. No nausea/vomiting. Diarrhea does not seem to be related to eating.Occasionally she has sharp quick abdominal pains with the stools.  No pain during the diarrhea episodes. No bloating. No fever reported. No unusual odor. Reports her reflux has worsened during the same time period.Reports she drinks adequate water every day. She does feel more tired than her normal.  Daughter had Cdiff 2 years ago she stated. Stated she had to cancel her recent physical appointment due to her daughter's illness at the time. Appointment scheduled for tomorrow.   Reason for Disposition . [1] SEVERE diarrhea (e.g., 7 or more times / day more than normal) AND [2] present > 24 hours (1 day)  Answer Assessment - Initial Assessment Questions 1. DIARRHEA SEVERITY: "How bad is the diarrhea?" "How many extra stools have you had in the past 24 hours than normal?"    - NO DIARRHEA (SCALE 0)   - MILD (SCALE 1-3): Few loose or mushy BMs; increase of 1-3 stools over normal daily number of stools; mild increase in ostomy output.   -  MODERATE (SCALE 4-7): Increase of 4-6 stools daily over normal; moderate increase in ostomy output. * SEVERE (SCALE 8-10; OR 'WORST POSSIBLE'): Increase of 7 or more stools daily over normal; moderate increase in ostomy output; incontinence.     Reports 7-8 daily 2. ONSET: "When did the diarrhea begin?"      About 4 weeks ago 3. BM CONSISTENCY: "How loose or watery is the diarrhea?"      Mostly watery 4. VOMITING: "Are you also vomiting?" If so, ask: "How many times in the past 24 hours?"      No vomiting 5. ABDOMINAL PAIN: "Are you having any abdominal pain?" If yes: "What does it feel like?" (e.g., crampy, dull, intermittent, constant)      Intermittent sharp pain that resolves quickly.  6. ABDOMINAL PAIN  SEVERITY: If present, ask: "How bad is the pain?"  (e.g., Scale 1-10; mild, moderate, or severe)   - MILD (1-3): doesn't interfere with normal activities, abdomen soft and not tender to touch    - MODERATE (4-7): interferes with normal activities or awakens from sleep, tender to touch    - SEVERE (8-10): excruciating pain, doubled over, unable to do any normal activities       "sharp quick pain" 7. ORAL INTAKE: If vomiting, "Have you been able to drink liquids?" "How much fluids have you had in the past 24 hours?"     Drinks plenty at least 7 bottles she reports.  8. HYDRATION: "Any signs of dehydration?" (e.g., dry mouth [not just dry lips], too weak to stand, dizziness, new weight loss) "When did you last urinate?"    Sometimes a dry mouth. Feels drained at times. Occasional dizziness. 9. EXPOSURE: "Have you traveled to a foreign country recently?" "Have you been exposed to anyone with diarrhea?" "Could you have eaten any food that was spoiled?"     no 10. ANTIBIOTIC USE: "Are you taking antibiotics now or have you taken antibiotics in the past 2 months?"       no 11. OTHER SYMPTOMS: "Do you have any other symptoms?" (e.g., fever, blood in stool)       no 12. PREGNANCY: "Is there any chance you are pregnant?" "When was your last menstrual period?"  no  Protocols used: DIARRHEA-A-AH

## 2018-08-11 ENCOUNTER — Other Ambulatory Visit: Payer: Self-pay

## 2018-08-11 ENCOUNTER — Ambulatory Visit (INDEPENDENT_AMBULATORY_CARE_PROVIDER_SITE_OTHER): Payer: Medicaid Other | Admitting: Family Medicine

## 2018-08-11 ENCOUNTER — Encounter: Payer: Self-pay | Admitting: Family Medicine

## 2018-08-11 VITALS — BP 160/119 | HR 97 | Temp 98.3°F | Ht 59.0 in | Wt 167.1 lb

## 2018-08-11 DIAGNOSIS — M62838 Other muscle spasm: Secondary | ICD-10-CM

## 2018-08-11 DIAGNOSIS — R232 Flushing: Secondary | ICD-10-CM

## 2018-08-11 DIAGNOSIS — R197 Diarrhea, unspecified: Secondary | ICD-10-CM | POA: Diagnosis not present

## 2018-08-11 MED ORDER — CIPROFLOXACIN HCL 250 MG PO TABS: 250.0000 mg | ORAL_TABLET | Freq: Two times a day (BID) | ORAL | 0 refills | Status: DC

## 2018-08-11 MED ORDER — METRONIDAZOLE 500 MG PO TABS: 500.0000 mg | ORAL_TABLET | Freq: Three times a day (TID) | ORAL | 0 refills | Status: DC

## 2018-08-11 MED ORDER — KETOROLAC TROMETHAMINE 60 MG/2ML IM SOLN
60.0000 mg | Freq: Once | INTRAMUSCULAR | Status: AC
  Administered 2018-08-11: 60 mg via INTRAMUSCULAR

## 2018-08-11 NOTE — Progress Notes (Signed)
BP (!) 160/119 (BP Location: Right Arm, Patient Position: Sitting, Cuff Size: Normal)   Pulse 97   Temp 98.3 F (36.8 C) (Oral)   Ht 4\' 11"  (1.499 m)   Wt 167 lb 1 oz (75.8 kg)   SpO2 99%   BMI 33.74 kg/m    Subjective:    Patient ID: Laurie Roman, female    DOB: 20-Oct-1975, 43 y.o.   MRN: [id number]  HPI: Laurie Roman is a 43 y.o. female  Chief Complaint  Patient presents with  . Diarrhea    Pt states its been happening for four weeks, goes 7/8 times a day. Took omodem but didn't help. Watery type of bm. Stomach makes noises  . Dehydration    Used a whole [employer] during time, mostly drinks water and has taken pedialyte, ginger ale   Watery loose stools for 4 weeks, going 7-8 times daily and having intermittent abdominal cramping. Imodium slowing it down very temporarily. Trying to stay hydrated but not able to keep up. No fevers, chills, sweats, recent travel, new foods. Does have a hx of IBS but always with constipation, never diarrhea.   Stopped her estrogen cream after 2 uses as she didn't like the feeling.    Also dealing with a severe muscle spasm of right side of neck. The robaxin isn't doing enough to ease it. Toradol helped a lot last time given.  Relevant past medical, surgical, family and social history reviewed and updated as indicated. Interim medical history since our last visit reviewed. Allergies and medications reviewed and updated.  Review of Systems  Per HPI unless specifically indicated above     Objective:    BP (!) 160/119 (BP Location: Right Arm, Patient Position: Sitting, Cuff Size: Normal)   Pulse 97   Temp 98.3 F (36.8 C) (Oral)   Ht 4\' 11"  (1.499 m)   Wt 167 lb 1 oz (75.8 kg)   SpO2 99%   BMI 33.74 kg/m   Wt Readings from Last 3 Encounters:  08/11/18 167 lb 1 oz (75.8 kg)  06/30/18 168 lb (76.2 kg)  06/03/18 172 lb (78 kg)    Physical Exam Vitals signs and nursing note reviewed.  Constitutional:      Appearance: Normal  appearance. She is not ill-appearing.  HENT:     Head: Atraumatic.     Mouth/Throat:     Mouth: Mucous membranes are moist.     Pharynx: Oropharynx is clear.  Eyes:     Extraocular Movements: Extraocular movements intact.     Conjunctiva/sclera: Conjunctivae normal.  Neck:     Musculoskeletal: Normal range of motion and neck supple.  Cardiovascular:     Rate and Rhythm: Normal rate and regular rhythm.     Heart sounds: Normal heart sounds.  Pulmonary:     Effort: Pulmonary effort is normal.     Breath sounds: Normal breath sounds.  Abdominal:     General: Bowel sounds are normal. There is no distension.     Palpations: Abdomen is soft.     Tenderness: There is no abdominal tenderness. There is no guarding or rebound.  Musculoskeletal: Normal range of motion.  Skin:    General: Skin is warm and dry.  Neurological:     Mental Status: She is alert and oriented to person, place, and time.  Psychiatric:        Mood and Affect: Mood normal.        Thought Content: Thought content normal.  Judgment: Judgment normal.     Results for orders placed or performed in visit on 08/11/18  Stool C-Diff Toxin Assay  Result Value Ref Range   C difficile Toxins A+B, EIA Negative Negative  Microscopic Examination  Result Value Ref Range   WBC, UA 0-5 0 - 5 /hpf   RBC, UA None seen 0 - 2 /hpf   Epithelial Cells (non renal) 0-10 0 - 10 /hpf   Casts Present None seen /lpf   Cast Type Hyaline casts N/A   Bacteria, UA Many (A) None seen/Few  Urine Culture, Reflex  Result Value Ref Range   Urine Culture, Routine Preliminary report    Organism ID, Bacteria Comment   CBC with Differential/Platelet  Result Value Ref Range   WBC 12.4 (H) 3.4 - 10.8 x10E3/uL   RBC 4.34 3.77 - 5.28 x10E6/uL   Hemoglobin 14.1 11.1 - 15.9 g/dL   Hematocrit 96.0 45.4 - 46.6 %   MCV 92 79 - 97 fL   MCH 32.5 26.6 - 33.0 pg   MCHC 35.5 31.5 - 35.7 g/dL   RDW 09.8 11.9 - 14.7 %   Platelets 288 150 - 450  x10E3/uL   Neutrophils 59 Not Estab. %   Lymphs 29 Not Estab. %   Monocytes 8 Not Estab. %   Eos 2 Not Estab. %   Basos 1 Not Estab. %   Neutrophils Absolute 7.4 (H) 1.4 - 7.0 x10E3/uL   Lymphocytes Absolute 3.6 (H) 0.7 - 3.1 x10E3/uL   Monocytes Absolute 1.0 (H) 0.1 - 0.9 x10E3/uL   EOS (ABSOLUTE) 0.2 0.0 - 0.4 x10E3/uL   Basophils Absolute 0.1 0.0 - 0.2 x10E3/uL   Immature Granulocytes 1 Not Estab. %   Immature Grans (Abs) 0.1 0.0 - 0.1 x10E3/uL  Comprehensive metabolic panel  Result Value Ref Range   Glucose 74 65 - 99 mg/dL   BUN 6 6 - 24 mg/dL   Creatinine, Ser 8.29 0.57 - 1.00 mg/dL   GFR calc non Af Amer 102 >59 mL/min/1.73   GFR calc Af Amer 117 >59 mL/min/1.73   BUN/Creatinine Ratio 8 (L) 9 - 23   Sodium 138 134 - 144 mmol/L   Potassium 5.4 (H) 3.5 - 5.2 mmol/L   Chloride 96 96 - 106 mmol/L   CO2 21 20 - 29 mmol/L   Calcium 9.6 8.7 - 10.2 mg/dL   Total Protein 7.3 6.0 - 8.5 g/dL   Albumin 4.7 3.8 - 4.8 g/dL   Globulin, Total 2.6 1.5 - 4.5 g/dL   Albumin/Globulin Ratio 1.8 1.2 - 2.2   Bilirubin Total 0.3 0.0 - 1.2 mg/dL   Alkaline Phosphatase 94 39 - 117 IU/L   AST 57 (H) 0 - 40 IU/L   ALT 45 (H) 0 - 32 IU/L  Lipase  Result Value Ref Range   Lipase 21 14 - 72 U/L  TSH  Result Value Ref Range   TSH 3.080 0.450 - 4.500 uIU/mL  UA/M w/rflx Culture, Routine  Result Value Ref Range   Specific Gravity, UA 1.020 1.005 - 1.030   pH, UA 6.5 5.0 - 7.5   Color, UA Yellow Yellow   Appearance Ur Clear Clear   Leukocytes, UA Negative Negative   Protein, UA 1+ (A) Negative/Trace   Glucose, UA Negative Negative   Ketones, UA Negative Negative   RBC, UA Negative Negative   Bilirubin, UA Negative Negative   Urobilinogen, Ur 0.2 0.2 - 1.0 mg/dL   Nitrite, UA Negative Negative  Microscopic Examination See below:    Urinalysis Reflex Comment       Assessment & Plan:   Problem List Items Addressed This Visit    None    Visit Diagnoses    Diarrhea of presumed  infectious origin    -  Primary   Will obtain some basic labs as well as stool testing, and start cipro and flagyl given duration and severity. Imodium prn, push fluids   Relevant Orders   CBC with Differential/Platelet (Completed)   Comprehensive metabolic panel (Completed)   Lipase (Completed)   TSH (Completed)   UA/M w/rflx Culture, Routine (Completed)   Ova and parasite examination   Fecal leukocytes   Stool C-Diff Toxin Assay (Completed)   Muscle spasms of neck       IM toradol given. Massage, heat, continued robaxin prn.    Relevant Medications   ketorolac (TORADOL) injection 60 mg (Completed)   Hot flashes       Stopped estrogen cream because she didn't like the way it felt. Does not want to seek other therapy at this time       Follow up plan: Return for as scheduled.

## 2018-08-12 LAB — COMPREHENSIVE METABOLIC PANEL
ALT: 45 IU/L — ABNORMAL HIGH (ref 0–32)
AST: 57 IU/L — ABNORMAL HIGH (ref 0–40)
Albumin/Globulin Ratio: 1.8 (ref 1.2–2.2)
Albumin: 4.7 g/dL (ref 3.8–4.8)
Alkaline Phosphatase: 94 IU/L (ref 39–117)
BUN/Creatinine Ratio: 8 — ABNORMAL LOW (ref 9–23)
BUN: 6 mg/dL (ref 6–24)
Bilirubin Total: 0.3 mg/dL (ref 0.0–1.2)
CO2: 21 mmol/L (ref 20–29)
Calcium: 9.6 mg/dL (ref 8.7–10.2)
Chloride: 96 mmol/L (ref 96–106)
Creatinine, Ser: 0.73 mg/dL (ref 0.57–1.00)
GFR calc Af Amer: 117 mL/min/{1.73_m2} (ref >59)
GFR calc non Af Amer: 102 mL/min/{1.73_m2} (ref >59)
Globulin, Total: 2.6 g/dL (ref 1.5–4.5)
Glucose: 74 mg/dL (ref 65–99)
Potassium: 5.4 mmol/L — ABNORMAL HIGH (ref 3.5–5.2)
Sodium: 138 mmol/L (ref 134–144)
Total Protein: 7.3 g/dL (ref 6.0–8.5)

## 2018-08-12 LAB — CBC WITH DIFFERENTIAL/PLATELET
Basophils Absolute: 0.1 10*3/uL (ref 0.0–0.2)
Basos: 1 %
EOS (ABSOLUTE): 0.2 10*3/uL (ref 0.0–0.4)
Eos: 2 %
Hematocrit: 39.7 % (ref 34.0–46.6)
Hemoglobin: 14.1 g/dL (ref 11.1–15.9)
Immature Grans (Abs): 0.1 10*3/uL (ref 0.0–0.1)
Immature Granulocytes: 1 %
Lymphocytes Absolute: 3.6 10*3/uL — ABNORMAL HIGH (ref 0.7–3.1)
Lymphs: 29 %
MCH: 32.5 pg (ref 26.6–33.0)
MCHC: 35.5 g/dL (ref 31.5–35.7)
MCV: 92 fL (ref 79–97)
Monocytes Absolute: 1 10*3/uL — ABNORMAL HIGH (ref 0.1–0.9)
Monocytes: 8 %
Neutrophils Absolute: 7.4 10*3/uL — ABNORMAL HIGH (ref 1.4–7.0)
Neutrophils: 59 %
Platelets: 288 10*3/uL (ref 150–450)
RBC: 4.34 x10E6/uL (ref 3.77–5.28)
RDW: 12.4 % (ref 11.7–15.4)
WBC: 12.4 10*3/uL — ABNORMAL HIGH (ref 3.4–10.8)

## 2018-08-12 LAB — TSH: TSH: 3.08 u[IU]/mL (ref 0.450–4.500)

## 2018-08-12 LAB — LIPASE: Lipase: 21 U/L (ref 14–72)

## 2018-08-13 LAB — CLOSTRIDIUM DIFFICILE EIA: C difficile Toxins A+B, EIA: NEGATIVE

## 2018-08-14 LAB — UA/M W/RFLX CULTURE, ROUTINE
Bilirubin, UA: NEGATIVE
Glucose, UA: NEGATIVE
Ketones, UA: NEGATIVE
Leukocytes, UA: NEGATIVE
Nitrite, UA: NEGATIVE
RBC, UA: NEGATIVE
Specific Gravity, UA: 1.02 (ref 1.005–1.030)
Urobilinogen, Ur: 0.2 mg/dL (ref 0.2–1.0)
pH, UA: 6.5 (ref 5.0–7.5)

## 2018-08-14 LAB — URINE CULTURE, REFLEX

## 2018-08-14 LAB — MICROSCOPIC EXAMINATION: RBC, UA: NONE SEEN /hpf (ref 0–2)

## 2018-08-16 LAB — OVA AND PARASITE EXAMINATION

## 2018-08-18 ENCOUNTER — Encounter: Payer: Self-pay | Admitting: Family Medicine

## 2018-08-22 ENCOUNTER — Other Ambulatory Visit: Payer: Self-pay | Admitting: Family Medicine

## 2018-08-22 DIAGNOSIS — R197 Diarrhea, unspecified: Secondary | ICD-10-CM

## 2018-08-23 LAB — FECAL LEUKOCYTES

## 2018-08-31 ENCOUNTER — Other Ambulatory Visit: Payer: Self-pay | Admitting: Unknown Physician Specialty

## 2018-09-01 ENCOUNTER — Telehealth: Payer: Medicaid Other | Admitting: Gastroenterology

## 2018-09-02 ENCOUNTER — Other Ambulatory Visit: Payer: Self-pay

## 2018-09-02 ENCOUNTER — Ambulatory Visit (INDEPENDENT_AMBULATORY_CARE_PROVIDER_SITE_OTHER): Payer: Medicaid Other | Admitting: Gastroenterology

## 2018-09-02 DIAGNOSIS — K219 Gastro-esophageal reflux disease without esophagitis: Secondary | ICD-10-CM

## 2018-09-05 ENCOUNTER — Other Ambulatory Visit: Payer: Self-pay | Admitting: Family Medicine

## 2018-09-05 NOTE — Telephone Encounter (Signed)
Requested Prescriptions  Pending Prescriptions Disp Refills  . pantoprazole (PROTONIX) 40 MG tablet [Pharmacy Med Name: PANTOPRAZOLE SOD DR 40 MG TAB] 90 tablet 0    Sig: Take 1 tablet (40 mg total) by mouth daily.     Gastroenterology: Proton Pump Inhibitors Passed - 09/05/2018  2:56 PM      Passed - Valid encounter within last 12 months    Recent Outpatient Visits          3 weeks ago Diarrhea of presumed infectious origin   [facility] [place], Salley Hews, [place]   2 months ago Essential hypertension   [facility] Roosvelt Maser [place], [place]   3 months ago Viral URI with cough   [facility] Roosvelt Maser [place], [place]   5 months ago Essential hypertension   [facility] Roosvelt Maser [place], [place]   8 months ago Acute left-sided low back pain with left-sided sciatica   [facility], Salley Hews, [place]      Future Appointments            In 3 weeks Maurice March, [place], PA-C [facility]

## 2018-09-06 ENCOUNTER — Other Ambulatory Visit: Payer: Self-pay | Admitting: Unknown Physician Specialty

## 2018-09-06 ENCOUNTER — Other Ambulatory Visit: Payer: Self-pay

## 2018-09-20 ENCOUNTER — Encounter: Payer: Self-pay | Admitting: Family Medicine

## 2018-09-21 NOTE — Telephone Encounter (Signed)
Please get her scheduled for virtual visit 

## 2018-09-22 NOTE — Telephone Encounter (Signed)
Spoke with patient. She was sleeping. She said she would call back.

## 2018-09-29 ENCOUNTER — Ambulatory Visit (INDEPENDENT_AMBULATORY_CARE_PROVIDER_SITE_OTHER): Payer: Medicaid Other | Admitting: Family Medicine

## 2018-09-29 ENCOUNTER — Other Ambulatory Visit: Payer: Self-pay

## 2018-09-29 ENCOUNTER — Encounter: Payer: Self-pay | Admitting: Family Medicine

## 2018-09-29 VITALS — BP 155/99 | HR 95 | Ht 59.0 in | Wt 167.0 lb

## 2018-09-29 DIAGNOSIS — M5432 Sciatica, left side: Secondary | ICD-10-CM | POA: Diagnosis not present

## 2018-09-29 DIAGNOSIS — F902 Attention-deficit hyperactivity disorder, combined type: Secondary | ICD-10-CM | POA: Diagnosis not present

## 2018-09-29 DIAGNOSIS — I10 Essential (primary) hypertension: Secondary | ICD-10-CM

## 2018-09-29 MED ORDER — AMPHETAMINE-DEXTROAMPHETAMINE 20 MG PO TABS: 20.0000 mg | ORAL_TABLET | Freq: Two times a day (BID) | ORAL | 0 refills | Status: DC

## 2018-09-29 MED ORDER — BENAZEPRIL HCL 40 MG PO TABS: 40.0000 mg | ORAL_TABLET | Freq: Every day | ORAL | 1 refills | Status: DC

## 2018-09-29 MED ORDER — BACLOFEN 10 MG PO TABS: 10.0000 mg | ORAL_TABLET | Freq: Three times a day (TID) | ORAL | 0 refills | Status: DC

## 2018-09-29 MED ORDER — AMLODIPINE BESYLATE 5 MG PO TABS: 5.0000 mg | ORAL_TABLET | Freq: Every day | ORAL | 0 refills | Status: DC

## 2018-09-29 MED ORDER — METOPROLOL SUCCINATE ER 50 MG PO TB24: 50.0000 mg | ORAL_TABLET | Freq: Every day | ORAL | 0 refills | Status: DC

## 2018-09-29 NOTE — Progress Notes (Signed)
BP (!) 155/99   Pulse 95   Ht  (1.499 m)   Wt 167 lb (75.8 kg)   BMI 33.73 kg/m    Subjective:    Patient ID: Laurie Roman, female    DOB: 06-26-1975, 43 y.o.   MRN: [id number]  HPI: Laurie Roman is a 43 y.o. female  Chief Complaint  Patient presents with  . Follow-up  . Hypertension    . This visit was completed via WebEx due to the restrictions of the COVID-19 pandemic. All issues as above were discussed and addressed. Physical exam was done as above through visual confirmation on WebEx. If it was felt that the patient should be evaluated in the office, they were directed there. The patient verbally consented to this visit. . Location of the patient: home . Location of the provider: work . Those involved with this call:  . Provider: Ro[place]velt Maser, PA-C . CMA: Elton Sin, CMA . Front Desk/Registration: Harriet Pho  . Time spent on call: 25 minutes with patient face to face via video conference. More than 50% of this time was spent in counseling and coordination of care. 10 minutes total spent in review of patient's record and preparation of their chart. I verified patient identity using two factors (patient name and date of birth). Patient consents verbally to being seen via telemedicine visit today.   Here today for HTN and ADHD f/u. Home readings staying around 160/100s lately. Having some headaches, but typically has those so not sure if they're new or her usual ones. No visual changes, CP, SOB, dizziness. Taking her amlodipne and benazepril faithfully without side effects.   Feels her adderall is going very well at current dose of 20 mg BID. Having much improvement with focus and task completion with addition of medication. No side effects, including CP, palpitations, sleep or appetite issues.   Still having significant issues with her left hip and sciatic nerve. 6+ months of frequent flares that never fully resolve, in pain every day from this. Tried many  conservative treatments including rounds of prednisone, gabapentin, robaxin, flexeril, NSAIDs, stretches, PT exercises with no relief. No saddle paresthesias, fevers, chills, leg weakness, bowel or bladder incontinence.   Relevant past medical, surgical, family and social history reviewed and updated as indicated. Interim medical history since our last visit reviewed. Allergies and medications reviewed and updated.  Review of Systems  Per HPI unless specifically indicated above     Objective:    BP (!) 155/99   Pulse 95   Ht  (1.499 m)   Wt 167 lb (75.8 kg)   BMI 33.73 kg/m   Wt Readings from Last 3 Encounters:  09/29/18 167 lb (75.8 kg)  08/11/18 167 lb 1 oz (75.8 kg)  06/30/18 168 lb (76.2 kg)    Physical Exam Vitals signs and nursing note reviewed.  Constitutional:      General: She is not in acute distress.    Appearance: Normal appearance.  HENT:     Head: Atraumatic.     Right Ear: External ear normal.     Left Ear: External ear normal.     Nose: Nose normal.     Mouth/Throat:     Mouth: Mucous membranes are moist.  Eyes:     Extraocular Movements: Extraocular movements intact.     Conjunctiva/sclera: Conjunctivae normal.  Neck:     Musculoskeletal: Normal range of motion.  Cardiovascular:     Comments: Unable to assess via virtual  visit Pulmonary:     Effort: Pulmonary effort is normal. No respiratory distress.  Musculoskeletal: Normal range of motion.     Comments: Antalgic gait Unable to perform detailed msk PE due to video visit  Skin:    General: Skin is dry.     Findings: No erythema.  Neurological:     Mental Status: She is alert and oriented to person, place, and time.  Psychiatric:        Mood and Affect: Mood normal.        Thought Content: Thought content normal.        Judgment: Judgment normal.     Results for orders placed or performed in visit on 08/11/18  Ova and parasite examination  Result Value Ref Range   OVA + PARASITE  EXAM Final report    O&P result 1 Comment   Fecal leukocytes  Result Value Ref Range   White Blood Cells (WBC), Stool Final report None Seen   Result 1 Comment   Stool C-Diff Toxin Assay  Result Value Ref Range   C difficile Toxins A+B, EIA Negative Negative  Microscopic Examination  Result Value Ref Range   WBC, UA 0-5 0 - 5 /hpf   RBC, UA None seen 0 - 2 /hpf   Epithelial Cells (non renal) 0-10 0 - 10 /hpf   Casts Present None seen /lpf   Cast Type Hyaline casts N/A   Bacteria, UA Many (A) None seen/Few  Urine Culture, Reflex  Result Value Ref Range   Urine Culture, Routine Final report (A)    Organism ID, Bacteria Enterococcus faecalis (A)    ORGANISM ID, BACTERIA Comment    Antimicrobial Susceptibility Comment   CBC with Differential/Platelet  Result Value Ref Range   WBC 12.4 (H) 3.4 - 10.8 x10E3/uL   RBC 4.34 3.77 - 5.28 x10E6/uL   Hemoglobin 14.1 11.1 - 15.9 g/dL   Hematocrit 17.6 16.0 - 46.6 %   MCV 92 79 - 97 fL   MCH 32.5 26.6 - 33.0 pg   MCHC 35.5 31.5 - 35.7 g/dL   RDW 73.7 10.6 - 26.9 %   Platelets 288 150 - 450 x10E3/uL   Neutrophils 59 Not Estab. %   Lymphs 29 Not Estab. %   Monocytes 8 Not Estab. %   Eos 2 Not Estab. %   Basos 1 Not Estab. %   Neutrophils Absolute 7.4 (H) 1.4 - 7.0 x10E3/uL   Lymphocytes Absolute 3.6 (H) 0.7 - 3.1 x10E3/uL   Monocytes Absolute 1.0 (H) 0.1 - 0.9 x10E3/uL   EOS (ABSOLUTE) 0.2 0.0 - 0.4 x10E3/uL   Basophils Absolute 0.1 0.0 - 0.2 x10E3/uL   Immature Granulocytes 1 Not Estab. %   Immature Grans (Abs) 0.1 0.0 - 0.1 x10E3/uL  Comprehensive metabolic panel  Result Value Ref Range   Glucose 74 65 - 99 mg/dL   BUN 6 6 - 24 mg/dL   Creatinine, Ser 4.85 0.57 - 1.00 mg/dL   GFR calc non Af Amer 102 >59 mL/min/1.73   GFR calc Af Amer 117 >59 mL/min/1.73   BUN/Creatinine Ratio 8 (L) 9 - 23   Sodium 138 134 - 144 mmol/L   Potassium 5.4 (H) 3.5 - 5.2 mmol/L   Chloride 96 96 - 106 mmol/L   CO2 21 20 - 29 mmol/L   Calcium 9.6  8.7 - 10.2 mg/dL   Total Protein 7.3 6.0 - 8.5 g/dL   Albumin 4.7 3.8 - 4.8 g/dL   Globulin, Total  2.6 1.5 - 4.5 g/dL   Albumin/Globulin Ratio 1.8 1.2 - 2.2   Bilirubin Total 0.3 0.0 - 1.2 mg/dL   Alkaline Phosphatase 94 39 - 117 IU/L   AST 57 (H) 0 - 40 IU/L   ALT 45 (H) 0 - 32 IU/L  Lipase  Result Value Ref Range   Lipase 21 14 - 72 U/L  TSH  Result Value Ref Range   TSH 3.080 0.450 - 4.500 uIU/mL  UA/M w/rflx Culture, Routine  Result Value Ref Range   Specific Gravity, UA 1.020 1.005 - 1.030   pH, UA 6.5 5.0 - 7.5   Color, UA Yellow Yellow   Appearance Ur Clear Clear   Leukocytes, UA Negative Negative   Protein, UA 1+ (A) Negative/Trace   Glucose, UA Negative Negative   Ketones, UA Negative Negative   RBC, UA Negative Negative   Bilirubin, UA Negative Negative   Urobilinogen, Ur 0.2 0.2 - 1.0 mg/dL   Nitrite, UA Negative Negative   Microscopic Examination See below:    Urinalysis Reflex Comment       Assessment & Plan:   Problem List Items Addressed This Visit      Cardiovascular and Mediastinum   Essential hypertension - Primary    Add metoprolol to current regimen, continue home monitoring and call with persistent abnormal readings. Work on stress and pain control in meantime, DASH diet, increasing activity.       Relevant Medications   metoprolol succinate (TOPROL-XL) 50 MG 24 hr tablet   amLODipine (NORVASC) 5 MG tablet   benazepril (LOTENSIN) 40 MG tablet     Nervous and Auditory   Left sided sciatica    Not improving with conservative measures. Referral placed to Neurosurgery for further management. D/c robaxin and try baclofen in meantime      Relevant Medications   baclofen (LIORESAL) 10 MG tablet   amphetamine-dextroamphetamine (ADDERALL) 20 MG tablet   amphetamine-dextroamphetamine (ADDERALL) 20 MG tablet (Start on 11/29/2018)   amphetamine-dextroamphetamine (ADDERALL) 20 MG tablet (Start on 10/29/2018)   Other Relevant Orders   Ambulatory referral  to Neurosurgery     Other   ADHD    Chronic, stable. Continue current regimen. [place] Controlled Substance database reviewed and appropriate.           Follow up plan: Return in about 4 weeks (around 10/27/2018) for BP f/u.

## 2018-10-03 ENCOUNTER — Encounter: Payer: Self-pay | Admitting: Family Medicine

## 2018-10-03 NOTE — Assessment & Plan Note (Signed)
Add metoprolol to current regimen, continue home monitoring and call with persistent abnormal readings. Work on stress and pain control in meantime, DASH diet, increasing activity.

## 2018-10-03 NOTE — Assessment & Plan Note (Addendum)
Not improving with conservative measures. Referral placed to Neurosurgery for further management. D/c robaxin and try baclofen in meantime

## 2018-10-03 NOTE — Assessment & Plan Note (Signed)
Chronic, stable. Continue current regimen. [place] Controlled Substance database reviewed and appropriate.

## 2018-10-04 ENCOUNTER — Encounter: Payer: Self-pay | Admitting: Family Medicine

## 2018-10-12 DIAGNOSIS — M542 Cervicalgia: Secondary | ICD-10-CM | POA: Diagnosis not present

## 2018-10-14 ENCOUNTER — Encounter: Payer: Self-pay | Admitting: Family Medicine

## 2018-10-27 ENCOUNTER — Other Ambulatory Visit: Payer: Self-pay | Admitting: Student

## 2018-10-27 DIAGNOSIS — M79605 Pain in left leg: Secondary | ICD-10-CM | POA: Diagnosis not present

## 2018-10-27 DIAGNOSIS — M5442 Lumbago with sciatica, left side: Secondary | ICD-10-CM | POA: Diagnosis not present

## 2018-10-27 DIAGNOSIS — G8929 Other chronic pain: Secondary | ICD-10-CM

## 2018-11-01 ENCOUNTER — Ambulatory Visit: Payer: Self-pay | Admitting: *Deleted

## 2018-11-01 NOTE — Telephone Encounter (Signed)
Patient wants to talk to a nurse about her yeast infection  Call to patient- patient is requesting medication for yeast infection- she wants Diflucan. Patient was not aware she had appointment tomorrow- she states she will discuss at appointment tomorrow- she wants to know if it is in the office for virtual.  Told patient I would have the office let her know.  Reason for Disposition . [1] Symptoms of a "yeast infection" (i.e., itchy, white discharge, not bad smelling) AND [2] not improved > 3 days following CARE ADVICE  Answer Assessment - Initial Assessment Questions 1. DISCHARGE: "Describe the discharge." (e.g., white, yellow, green, gray, foamy, cottage cheese-like)     Patient states she has itching some discharge 2. ODOR: "Is there a bad odor?"     Did not ask 3. ONSET: "When did the discharge begin?"     Patient has been treating with Monistat 7  4. RASH: "Is there a rash in that area?" If so, ask: "Describe it." (e.g., redness, blisters, sores, bumps)     no 5. ABDOMINAL PAIN: "Are you having any abdominal pain?" If yes: "What does it feel like? " (e.g., crampy, dull, intermittent, constant)      no 6. ABDOMINAL PAIN SEVERITY: If present, ask: "How bad is it?"  (e.g., mild, moderate, severe)  - MILD - doesn't interfere with normal activities   - MODERATE - interferes with normal activities or awakens from sleep   - SEVERE - patient doesn't want to move (R/O peritonitis)      no 7. CAUSE: "What do you think is causing the discharge?" "Have you had the same problem before? What happened then?"     Yeast- patient states she used Flagyl 8. OTHER SYMPTOMS: "Do you have any other symptoms?" (e.g., fever, itching, vaginal bleeding, pain with urination, injury to genital area, vaginal foreign body)     itching 9. PREGNANCY: "Is there any chance you are pregnant?" "When was your last menstrual period?"     Not asked  Protocols used: VAGINAL DISCHARGE-A-AH

## 2018-11-02 ENCOUNTER — Other Ambulatory Visit: Payer: Self-pay

## 2018-11-02 ENCOUNTER — Encounter: Payer: Self-pay | Admitting: Family Medicine

## 2018-11-02 ENCOUNTER — Ambulatory Visit (INDEPENDENT_AMBULATORY_CARE_PROVIDER_SITE_OTHER): Payer: Medicaid Other | Admitting: Family Medicine

## 2018-11-02 VITALS — BP 144/87 | HR 102

## 2018-11-02 DIAGNOSIS — I10 Essential (primary) hypertension: Secondary | ICD-10-CM

## 2018-11-02 DIAGNOSIS — M5432 Sciatica, left side: Secondary | ICD-10-CM

## 2018-11-02 DIAGNOSIS — K219 Gastro-esophageal reflux disease without esophagitis: Secondary | ICD-10-CM | POA: Diagnosis not present

## 2018-11-02 DIAGNOSIS — N898 Other specified noninflammatory disorders of vagina: Secondary | ICD-10-CM

## 2018-11-02 MED ORDER — METOPROLOL SUCCINATE ER 50 MG PO TB24: 50.0000 mg | ORAL_TABLET | Freq: Every day | ORAL | 1 refills | Status: DC

## 2018-11-02 MED ORDER — HYDROCODONE-ACETAMINOPHEN 5-325 MG PO TABS: 1.0000 | ORAL_TABLET | Freq: Four times a day (QID) | ORAL | 0 refills | Status: DC | PRN

## 2018-11-02 MED ORDER — FLUCONAZOLE 150 MG PO TABS: 150.0000 mg | ORAL_TABLET | Freq: Once | ORAL | 0 refills | Status: AC

## 2018-11-02 MED ORDER — PANTOPRAZOLE SODIUM 40 MG PO TBEC: 40.0000 mg | DELAYED_RELEASE_TABLET | Freq: Two times a day (BID) | ORAL | 0 refills | Status: DC

## 2018-11-02 NOTE — Telephone Encounter (Signed)
Please let her know the appt is virtual for today

## 2018-11-02 NOTE — Progress Notes (Signed)
BP (!) 144/87 Comment: pt reported- virtual visit  Pulse (!) 102 Comment: pt reported- virtual visit   Subjective:    Patient ID: Laurie Roman, female    DOB: 1975-09-12, 43 y.o.   MRN: [id number]  HPI: Laurie Roman is a 43 y.o. female  Chief Complaint  Patient presents with  . Hypertension    4 week f/up     . This visit was completed via WebEx due to the restrictions of the COVID-19 pandemic. All issues as above were discussed and addressed. Physical exam was done as above through visual confirmation on WebEx. If it was felt that the patient should be evaluated in the office, they were directed there. The patient verbally consented to this visit. . Location of the patient: home . Location of the provider: home . Those involved with this call:  . Provider: Roosvelt Maser, PA-C . CMA: Wilhemena Durie, CMA . Front Desk/Registration: Harriet Pho  . Time spent on call: 30 minutes with patient face to face via video conference. More than 50% of this time was spent in counseling and coordination of care. 10 minutes total spent in review of patient's record and preparation of their chart. I verified patient identity using two factors (patient name and date of birth). Patient consents verbally to being seen via telemedicine visit today.   Here today for HTN f/u after adding metoprolol to current regimen of benazepril and amlodipine. States initially BPs improved and were high 130s/80s-90s, but the past 2 weeks her husband has had surgery and her dad was dx'd with cancer and she is having severe tailbone pain from a fall so her pressures are back up. Denies CP, SOB, HAs, dizziness.   Has an mri June 5th with neurosurgery for her chronic left sided sciatica and low back issues. Recently fell onto tailbone and has had significantly worsened pain since then. About to complete a prednisone taper and still doing her gabapentin, minimal relief with either of those or OTC pain relievers. .    Vaginal itching and discharge x 4 days. Monistat helping just a little. Hx of yeast infections that require at least one dose of diflucan. Denies concern for STIs, pelvic pain, dysuria, abdominal pain, fever.   States her protonix was only written for once daily and she has run out early. Has to take twice daily or she has severe breakthrough GERD sxs per patient. Currently supplementing with OTC PPIs. Denies hematemesis, melena, severe abdominal pain, anorexia, weight loss. Currently being worked up by GI for chronic diarrhea.   Relevant past medical, surgical, family and social history reviewed and updated as indicated. Interim medical history since our last visit reviewed. Allergies and medications reviewed and updated.  Review of Systems  Per HPI unless specifically indicated above     Objective:    BP (!) 144/87 Comment: pt reported- virtual visit  Pulse (!) 102 Comment: pt reported- virtual visit  Wt Readings from Last 3 Encounters:  09/29/18 167 lb (75.8 kg)  08/11/18 167 lb 1 oz (75.8 kg)  06/30/18 168 lb (76.2 kg)    Physical Exam Vitals signs and nursing note reviewed.  Constitutional:      General: She is not in acute distress.    Appearance: Normal appearance.  HENT:     Head: Atraumatic.     Right Ear: External ear normal.     Left Ear: External ear normal.     Nose: Nose normal. No congestion.     Mouth/Throat:  Mouth: Mucous membranes are moist.     Pharynx: Oropharynx is clear. No posterior oropharyngeal erythema.  Eyes:     Extraocular Movements: Extraocular movements intact.     Conjunctiva/sclera: Conjunctivae normal.  Neck:     Musculoskeletal: Normal range of motion.  Cardiovascular:     Comments: Unable to assess via virtual visit Pulmonary:     Effort: Pulmonary effort is normal. No respiratory distress.  Abdominal:     Tenderness: There is no abdominal tenderness (per patient's self abdominal exam).  Musculoskeletal: Normal range of motion.   Skin:    General: Skin is dry.     Findings: No erythema.  Neurological:     Mental Status: She is alert and oriented to person, place, and time.  Psychiatric:        Mood and Affect: Mood normal.        Thought Content: Thought content normal.        Judgment: Judgment normal.     Results for orders placed or performed in visit on 08/11/18  Ova and parasite examination  Result Value Ref Range   OVA + PARASITE EXAM Final report    O&P result 1 Comment   Fecal leukocytes  Result Value Ref Range   White Blood Cells (WBC), Stool Final report None Seen   Result 1 Comment   Stool C-Diff Toxin Assay  Result Value Ref Range   C difficile Toxins A+B, EIA Negative Negative  Microscopic Examination  Result Value Ref Range   WBC, UA 0-5 0 - 5 /hpf   RBC, UA None seen 0 - 2 /hpf   Epithelial Cells (non renal) 0-10 0 - 10 /hpf   Casts Present None seen /lpf   Cast Type Hyaline casts N/A   Bacteria, UA Many (A) None seen/Few  Urine Culture, Reflex  Result Value Ref Range   Urine Culture, Routine Final report (A)    Organism ID, Bacteria Enterococcus faecalis (A)    ORGANISM ID, BACTERIA Comment    Antimicrobial Susceptibility Comment   CBC with Differential/Platelet  Result Value Ref Range   WBC 12.4 (H) 3.4 - 10.8 x10E3/uL   RBC 4.34 3.77 - 5.28 x10E6/uL   Hemoglobin 14.1 11.1 - 15.9 g/dL   Hematocrit 96.039.7 45.434.0 - 46.6 %   MCV 92 79 - 97 fL   MCH 32.5 26.6 - 33.0 pg   MCHC 35.5 31.5 - 35.7 g/dL   RDW 09.812.4 11.911.7 - 14.715.4 %   Platelets 288 150 - 450 x10E3/uL   Neutrophils 59 Not Estab. %   Lymphs 29 Not Estab. %   Monocytes 8 Not Estab. %   Eos 2 Not Estab. %   Basos 1 Not Estab. %   Neutrophils Absolute 7.4 (H) 1.4 - 7.0 x10E3/uL   Lymphocytes Absolute 3.6 (H) 0.7 - 3.1 x10E3/uL   Monocytes Absolute 1.0 (H) 0.1 - 0.9 x10E3/uL   EOS (ABSOLUTE) 0.2 0.0 - 0.4 x10E3/uL   Basophils Absolute 0.1 0.0 - 0.2 x10E3/uL   Immature Granulocytes 1 Not Estab. %   Immature Grans (Abs) 0.1  0.0 - 0.1 x10E3/uL  Comprehensive metabolic panel  Result Value Ref Range   Glucose 74 65 - 99 mg/dL   BUN 6 6 - 24 mg/dL   Creatinine, Ser 8.290.73 0.57 - 1.00 mg/dL   GFR calc non Af Amer 102 >59 mL/min/1.73   GFR calc Af Amer 117 >59 mL/min/1.73   BUN/Creatinine Ratio 8 (L) 9 - 23   Sodium 138  134 - 144 mmol/L   Potassium 5.4 (H) 3.5 - 5.2 mmol/L   Chloride 96 96 - 106 mmol/L   CO2 21 20 - 29 mmol/L   Calcium 9.6 8.7 - 10.2 mg/dL   Total Protein 7.3 6.0 - 8.5 g/dL   Albumin 4.7 3.8 - 4.8 g/dL   Globulin, Total 2.6 1.5 - 4.5 g/dL   Albumin/Globulin Ratio 1.8 1.2 - 2.2   Bilirubin Total 0.3 0.0 - 1.2 mg/dL   Alkaline Phosphatase 94 39 - 117 IU/L   AST 57 (H) 0 - 40 IU/L   ALT 45 (H) 0 - 32 IU/L  Lipase  Result Value Ref Range   Lipase 21 14 - 72 U/L  TSH  Result Value Ref Range   TSH 3.080 0.450 - 4.500 uIU/mL  UA/M w/rflx Culture, Routine  Result Value Ref Range   Specific Gravity, UA 1.020 1.005 - 1.030   pH, UA 6.5 5.0 - 7.5   Color, UA Yellow Yellow   Appearance Ur Clear Clear   Leukocytes, UA Negative Negative   Protein, UA 1+ (A) Negative/Trace   Glucose, UA Negative Negative   Ketones, UA Negative Negative   RBC, UA Negative Negative   Bilirubin, UA Negative Negative   Urobilinogen, Ur 0.2 0.2 - 1.0 mg/dL   Nitrite, UA Negative Negative   Microscopic Examination See below:    Urinalysis Reflex Comment       Assessment & Plan:   Problem List Items Addressed This Visit      Cardiovascular and Mediastinum   Essential hypertension - Primary    BPs still elevated but per patient she is in significant pain and under a great deal of stress currently. Would like to wait until things calm down and reassess as she feels this dose worked before all of the stressors. Continue close home monitoring and recheck in 2 months      Relevant Medications   metoprolol succinate (TOPROL-XL) 50 MG 24 hr tablet     Digestive   Gastroesophageal reflux disease without esophagitis     Change rx to BID and f/u with GI if sxs persist      Relevant Medications   pantoprazole (PROTONIX) 40 MG tablet     Nervous and Auditory   Left sided sciatica    Now following with Neurosurgery for this. Will supply a small amount of hydrocodone for severe pain but patient to f/u with specialist for further management. Await MRI results       Other Visit Diagnoses    Vaginal itching       Tx with diflucan and f/u if worsening or not improving       Follow up plan: Return in about 2 months (around 01/02/2019) for ADHD, BP f/u.

## 2018-11-07 ENCOUNTER — Encounter: Payer: Self-pay | Admitting: Family Medicine

## 2018-11-07 NOTE — Assessment & Plan Note (Signed)
Now following with Neurosurgery for this. Will supply a small amount of hydrocodone for severe pain but patient to f/u with specialist for further management. Await MRI results

## 2018-11-07 NOTE — Assessment & Plan Note (Signed)
BPs still elevated but per patient she is in significant pain and under a great deal of stress currently. Would like to wait until things calm down and reassess as she feels this dose worked before all of the stressors. Continue close home monitoring and recheck in 2 months

## 2018-11-07 NOTE — Assessment & Plan Note (Signed)
Change rx to BID and f/u with GI if sxs persist

## 2018-11-08 DIAGNOSIS — S3992XA Unspecified injury of lower back, initial encounter: Secondary | ICD-10-CM | POA: Diagnosis not present

## 2018-11-08 DIAGNOSIS — M533 Sacrococcygeal disorders, not elsewhere classified: Secondary | ICD-10-CM | POA: Diagnosis not present

## 2018-11-11 ENCOUNTER — Ambulatory Visit: Payer: Medicaid Other

## 2018-11-15 ENCOUNTER — Ambulatory Visit
Admission: RE | Admit: 2018-11-15 | Discharge: 2018-11-15 | Disposition: A | Discharge status: Home / Self Care | Payer: Medicaid Other | Source: Ambulatory Visit | Attending: Student | Admitting: Student

## 2018-11-15 ENCOUNTER — Other Ambulatory Visit: Payer: Self-pay

## 2018-11-15 DIAGNOSIS — G8929 Other chronic pain: Secondary | ICD-10-CM | POA: Insufficient documentation

## 2018-11-15 DIAGNOSIS — M545 Low back pain: Secondary | ICD-10-CM | POA: Diagnosis not present

## 2018-11-15 DIAGNOSIS — M5442 Lumbago with sciatica, left side: Secondary | ICD-10-CM | POA: Diagnosis not present

## 2018-11-18 ENCOUNTER — Other Ambulatory Visit: Payer: Self-pay | Admitting: Student

## 2018-11-18 DIAGNOSIS — M5416 Radiculopathy, lumbar region: Secondary | ICD-10-CM

## 2018-11-25 ENCOUNTER — Other Ambulatory Visit: Payer: Self-pay | Admitting: Unknown Physician Specialty

## 2018-12-02 ENCOUNTER — Ambulatory Visit (INDEPENDENT_AMBULATORY_CARE_PROVIDER_SITE_OTHER): Payer: Medicaid Other | Admitting: Nurse Practitioner

## 2018-12-02 ENCOUNTER — Other Ambulatory Visit: Payer: Self-pay

## 2018-12-02 ENCOUNTER — Ambulatory Visit: Payer: Self-pay | Admitting: *Deleted

## 2018-12-02 ENCOUNTER — Encounter: Payer: Self-pay | Admitting: Nurse Practitioner

## 2018-12-02 VITALS — BP 168/97 | HR 108 | Temp 98.6°F | Ht 59.0 in | Wt 167.0 lb

## 2018-12-02 DIAGNOSIS — J0100 Acute maxillary sinusitis, unspecified: Secondary | ICD-10-CM | POA: Diagnosis not present

## 2018-12-02 DIAGNOSIS — J320 Chronic maxillary sinusitis: Secondary | ICD-10-CM | POA: Insufficient documentation

## 2018-12-02 DIAGNOSIS — I10 Essential (primary) hypertension: Secondary | ICD-10-CM | POA: Diagnosis not present

## 2018-12-02 MED ORDER — GUAIFENESIN-CODEINE 100-10 MG/5ML PO SOLN: 5.0000 mL | Freq: Three times a day (TID) | ORAL | 0 refills | Status: DC | PRN

## 2018-12-02 MED ORDER — DOXYCYCLINE HYCLATE 100 MG PO TABS: 100.0000 mg | ORAL_TABLET | Freq: Two times a day (BID) | ORAL | 0 refills | Status: DC

## 2018-12-02 NOTE — Patient Instructions (Signed)

## 2018-12-02 NOTE — Telephone Encounter (Signed)
Summary: cough    Patient calling with complaints of severe cough and headache along with pressure under eyes. She is requesting call back from NT. Please advise.          Returned call to patient regarding the above message. She is requesting an antibiotic and cough medication for her cough. She stated that they all have been tested for covid-19 because of her dad having lung cancer. She states that she has a sinus infection with puffiness under eyed and pain in the sinus area. She has a cough that is worst at night because of the drainage. She denies fever. Advised of having a virtual appointment for today. [facility] notified regarding an appointment for today. They will give her a call back regarding this. Pt advised to call back for other concerns. Routing to practice for review.  Reason for Disposition . SEVERE coughing spells (e.g., whooping sound after coughing, vomiting after coughing)  Answer Assessment - Initial Assessment Questions 1. ONSET: "When did the cough begin?"      3 days 2. SEVERITY: "How bad is the cough today?"      Pretty bad at night 3. RESPIRATORY DISTRESS: "Describe your breathing."      no 4. FEVER: "Do you have a fever?" If so, ask: "What is your temperature, how was it measured, and when did it start?"     no 5. SPUTUM: "Describe the color of your sputum" (clear, white, yellow, green)     Yellow and green 6. HEMOPTYSIS: "Are you coughing up any blood?" If so ask: "How much?" (flecks, streaks, tablespoons, etc.)     no 7. CARDIAC HISTORY: "Do you have any history of heart disease?" (e.g., heart attack, congestive heart failure)      no 8. LUNG HISTORY: "Do you have any history of lung disease?"  (e.g., pulmonary embolus, asthma, emphysema)     asthma 9. PE RISK FACTORS: "Do you have a history of blood clots?" (or: recent major surgery, recent prolonged travel, bedridden)     no 10. OTHER SYMPTOMS: "Do you have any other symptoms?" (e.g.,  runny nose, wheezing, chest pain)       Runny nose 11. PREGNANCY: "Is there any chance you are pregnant?" "When was your last menstrual period?"       Not pregnant. LMP 2005 12. TRAVEL: "Have you traveled out of the country in the last month?" (e.g., travel history, exposures)       no  Protocols used: [place]

## 2018-12-02 NOTE — Assessment & Plan Note (Signed)
Acute with worsening.  Script for Doxycycline and guaifenesin-codeine cough syrup sent.  Recommend following: - Increased rest - Increasing Fluids - Acetaminophen as needed for fever/pain.  - Continue Coricidin as needed for symptoms - Saline sinus flushes or a neti pot.  - Humidifying the air  Return in 2 weeks for visit with PCP for BP check.

## 2018-12-02 NOTE — Assessment & Plan Note (Signed)
BP elevated today (repeat improved, but remains above goal), will have her follow-up in two weeks, after acute illness resolved, with PCP for BP check and medication changes.

## 2018-12-02 NOTE — Progress Notes (Signed)
BP (!) 168/97 (BP Location: Left Wrist, Patient Position: Sitting, Cuff Size: Normal)   Pulse (!) 108   Temp 98.6 F (37 C) (Oral)   Ht 4\' 11"  (1.499 m)   Wt 167 lb (75.8 kg)   BMI 33.73 kg/m    Subjective:    Patient ID: Laurie Roman, female    DOB: 04/30/1976, 43 y.o.   MRN: [id number]  HPI: Laurie JunesMelissa G Darcey is a 43 y.o. female  Chief Complaint  Patient presents with  . Cough    Ongoing cough for 3 days.  . Sinusitis    . This visit was completed via WebEx due to the restrictions of the COVID-19 pandemic. All issues as above were discussed and addressed. Physical exam was done as above through visual confirmation on WebEx. If it was felt that the patient should be evaluated in the office, they were directed there. The patient verbally consented to this visit. . Location of the patient: home . Location of the provider: home . Those involved with this call:  . Provider: Aura DialsJolene , DNP . CMA: Myrtha MantisKeri Bullock, CMA . Front Desk/Registration: Harriet PhoJoliza Johnson  . Time spent on call: 15 minutes with patient face to face via video conference. More than 50% of this time was spent in counseling and coordination of care. 10 minutes total spent in review of patient's record and preparation of their chart. I verified patient identity using two factors (patient name and date of birth). Patient consents verbally to being seen via telemedicine visit today.   UPPER RESPIRATORY TRACT INFECTION Last URI on review was in December 2019.  She has underlying dx of asthma, uses Symbicort and has Flonase spray for nose + has Albuterol inhaler (this is not on chart, she states her whole family uses them so they share).  Current symptoms started 3-4 days ago, she initially thought it was due to broken air conditioner.  Then once air conditioner fixed it became worse.  Reports her father is being treated for lung cancer and she is trying to "nip this in the butt" before it gets worse.  Has not been exposed  to a Covid positive patient and no recent travel overseas.  States that with her dad's lung cancer there has been a lot of Covid testing in the family and "so far everyone has been negative".  Reports she has a history of sinus infection at least once a year and current symptoms are similar.  Has swelling under eyes and pressure + reports "coughing up yellow stuff and nose stuff". Worst symptom: sinus pressure Fever: no Cough: yes Shortness of breath: no Wheezing: no Chest pain: no Chest tightness: no Chest congestion: no Nasal congestion: yes Runny nose: no Post nasal drip: no Sneezing: no Sore throat: no Swollen glands: no Sinus pressure: yes Headache: yes Face pain: yes Toothache: no Ear pain: none Ear pressure: none Eyes red/itching:no Eye drainage/crusting: no  Vomiting: no Rash: no Fatigue: yes Sick contacts: no Strep contacts: no  Context: worse Recurrent sinusitis: no Relief with OTC cold/cough medications: no  Treatments attempted: Coricidin   Relevant past medical, surgical, family and social history reviewed and updated as indicated. Interim medical history since our last visit reviewed. Allergies and medications reviewed and updated.  Review of Systems  Constitutional: Positive for fatigue. Negative for activity change, appetite change, chills and fever.  HENT: Positive for congestion, sinus pressure and sinus pain. Negative for ear discharge, ear pain, facial swelling, postnasal drip, rhinorrhea, sneezing, sore throat  and voice change.   Eyes: Negative for pain and visual disturbance.  Respiratory: Positive for cough. Negative for chest tightness, shortness of breath and wheezing.   Cardiovascular: Negative for chest pain, palpitations and leg swelling.  Gastrointestinal: Negative for abdominal distention, abdominal pain, constipation, diarrhea, nausea and vomiting.  Endocrine: Negative.   Musculoskeletal: Negative for myalgias.  Neurological: Negative for  dizziness, weakness, numbness and headaches.  Psychiatric/Behavioral: Negative.     Per HPI unless specifically indicated above     Objective:    BP (!) 168/97 (BP Location: Left Wrist, Patient Position: Sitting, Cuff Size: Normal)   Pulse (!) 108   Temp 98.6 F (37 C) (Oral)   Ht 4\' 11"  (1.499 m)   Wt 167 lb (75.8 kg)   BMI 33.73 kg/m   Wt Readings from Last 3 Encounters:  12/02/18 167 lb (75.8 kg)  09/29/18 167 lb (75.8 kg)  08/11/18 167 lb 1 oz (75.8 kg)    Physical Exam Vitals signs and nursing note reviewed.  Constitutional:      General: She is awake. She is not in acute distress.    Appearance: She is well-developed. She is ill-appearing.  HENT:     Head: Normocephalic.     Right Ear: Hearing and external ear normal. No drainage.     Left Ear: Hearing and external ear normal. No drainage.     Nose: No rhinorrhea.     Right Sinus: Maxillary sinus tenderness present. No frontal sinus tenderness.     Left Sinus: Maxillary sinus tenderness present. No frontal sinus tenderness.     Comments: Patient self palpated sinuses and reports discomfort to bilateral maxillary sinuses. Eyes:     General: Lids are normal.        Right eye: No discharge.        Left eye: No discharge.     Conjunctiva/sclera: Conjunctivae normal.  Neck:     Musculoskeletal: Normal range of motion.  Cardiovascular:     Comments: Unable to auscultate due to virtual exam only Pulmonary:     Effort: Pulmonary effort is normal. No accessory muscle usage or respiratory distress.     Comments: Unable to auscultate due to virtual exam only.  No SOb with talking.  No cough present during exam period. Abdominal:     Comments: Unable to auscultate due to virtual exam only   Neurological:     Mental Status: She is alert and oriented to person, place, and time.  Psychiatric:        Attention and Perception: Attention normal.        Mood and Affect: Mood normal.        Behavior: Behavior normal. Behavior  is cooperative.        Thought Content: Thought content normal.        Judgment: Judgment normal.     Results for orders placed or performed in visit on 08/11/18  Ova and parasite examination   Specimen: Stool   ST  Result Value Ref Range   OVA + PARASITE EXAM Final report    O&P result 1 Comment   Fecal leukocytes   Specimen: Stool   ST  Result Value Ref Range   White Blood Cells (WBC), Stool Final report None Seen   Result 1 Comment   Stool C-Diff Toxin Assay   Specimen: Stool   ST  Result Value Ref Range   C difficile Toxins A+B, EIA Negative Negative  Microscopic Examination   URINE  Result  Value Ref Range   WBC, UA 0-5 0 - 5 /hpf   RBC, UA None seen 0 - 2 /hpf   Epithelial Cells (non renal) 0-10 0 - 10 /hpf   Casts Present None seen /lpf   Cast Type Hyaline casts N/A   Bacteria, UA Many (A) None seen/Few  Urine Culture, Reflex   URINE  Result Value Ref Range   Urine Culture, Routine Final report (A)    Organism ID, Bacteria Enterococcus faecalis (A)    ORGANISM ID, BACTERIA Comment    Antimicrobial Susceptibility Comment   CBC with Differential/Platelet  Result Value Ref Range   WBC 12.4 (H) 3.4 - 10.8 x10E3/uL   RBC 4.34 3.77 - 5.28 x10E6/uL   Hemoglobin 14.1 11.1 - 15.9 g/dL   Hematocrit 16.139.7 09.634.0 - 46.6 %   MCV 92 79 - 97 fL   MCH 32.5 26.6 - 33.0 pg   MCHC 35.5 31.5 - 35.7 g/dL   RDW 04.512.4 40.911.7 - 81.115.4 %   Platelets 288 150 - 450 x10E3/uL   Neutrophils 59 Not Estab. %   Lymphs 29 Not Estab. %   Monocytes 8 Not Estab. %   Eos 2 Not Estab. %   Basos 1 Not Estab. %   Neutrophils Absolute 7.4 (H) 1.4 - 7.0 x10E3/uL   Lymphocytes Absolute 3.6 (H) 0.7 - 3.1 x10E3/uL   Monocytes Absolute 1.0 (H) 0.1 - 0.9 x10E3/uL   EOS (ABSOLUTE) 0.2 0.0 - 0.4 x10E3/uL   Basophils Absolute 0.1 0.0 - 0.2 x10E3/uL   Immature Granulocytes 1 Not Estab. %   Immature Grans (Abs) 0.1 0.0 - 0.1 x10E3/uL  Comprehensive metabolic panel  Result Value Ref Range   Glucose 74 65 -  99 mg/dL   BUN 6 6 - 24 mg/dL   Creatinine, Ser 9.140.73 0.57 - 1.00 mg/dL   GFR calc non Af Amer 102 >59 mL/min/1.73   GFR calc Af Amer 117 >59 mL/min/1.73   BUN/Creatinine Ratio 8 (L) 9 - 23   Sodium 138 134 - 144 mmol/L   Potassium 5.4 (H) 3.5 - 5.2 mmol/L   Chloride 96 96 - 106 mmol/L   CO2 21 20 - 29 mmol/L   Calcium 9.6 8.7 - 10.2 mg/dL   Total Protein 7.3 6.0 - 8.5 g/dL   Albumin 4.7 3.8 - 4.8 g/dL   Globulin, Total 2.6 1.5 - 4.5 g/dL   Albumin/Globulin Ratio 1.8 1.2 - 2.2   Bilirubin Total 0.3 0.0 - 1.2 mg/dL   Alkaline Phosphatase 94 39 - 117 IU/L   AST 57 (H) 0 - 40 IU/L   ALT 45 (H) 0 - 32 IU/L  Lipase  Result Value Ref Range   Lipase 21 14 - 72 U/L  TSH  Result Value Ref Range   TSH 3.080 0.450 - 4.500 uIU/mL  UA/M w/rflx Culture, Routine   Specimen: Urine   URINE  Result Value Ref Range   Specific Gravity, UA 1.020 1.005 - 1.030   pH, UA 6.5 5.0 - 7.5   Color, UA Yellow Yellow   Appearance Ur Clear Clear   Leukocytes, UA Negative Negative   Protein, UA 1+ (A) Negative/Trace   Glucose, UA Negative Negative   Ketones, UA Negative Negative   RBC, UA Negative Negative   Bilirubin, UA Negative Negative   Urobilinogen, Ur 0.2 0.2 - 1.0 mg/dL   Nitrite, UA Negative Negative   Microscopic Examination See below:    Urinalysis Reflex Comment  Assessment & Plan:   Problem List Items Addressed This Visit      Cardiovascular and Mediastinum   Essential hypertension    BP elevated today (repeat improved, but remains above goal), will have her follow-up in two weeks, after acute illness resolved, with PCP for BP check and medication changes.        Respiratory   Maxillary sinusitis - Primary    Acute with worsening.  Script for Doxycycline and guaifenesin-codeine cough syrup sent.  Recommend following: - Increased rest - Increasing Fluids - Acetaminophen as needed for fever/pain.  - Continue Coricidin as needed for symptoms - Saline sinus flushes or a neti  pot.  - Humidifying the air  Return in 2 weeks for visit with PCP for BP check.      Relevant Medications   guaiFENesin-codeine 100-10 MG/5ML syrup   doxycycline (VIBRA-TABS) 100 MG tablet     Controlled substance.  Checked data base and no other refills of controlled substances noted, last fill of controlled substance was [place] on 11/29/18.   I discussed the assessment and treatment plan with the patient. The patient was provided an opportunity to ask questions and all were answered. The patient agreed with the plan and demonstrated an understanding of the instructions.   The patient was advised to call back or seek an in-person evaluation if the symptoms worsen or if the condition fails to improve as anticipated.   I provided 15 minutes of time during this encounter.  Follow up plan: Return in about 2 weeks (around 12/16/2018) for In office with PCP for BP check.

## 2018-12-07 DIAGNOSIS — F411 Generalized anxiety disorder: Secondary | ICD-10-CM | POA: Diagnosis not present

## 2018-12-07 DIAGNOSIS — F321 Major depressive disorder, single episode, moderate: Secondary | ICD-10-CM | POA: Diagnosis not present

## 2018-12-08 ENCOUNTER — Other Ambulatory Visit: Payer: Self-pay

## 2018-12-08 ENCOUNTER — Ambulatory Visit
Admission: RE | Admit: 2018-12-08 | Discharge: 2018-12-08 | Disposition: A | Discharge status: Home / Self Care | Payer: Medicaid Other | Source: Ambulatory Visit | Attending: Student | Admitting: Student

## 2018-12-08 DIAGNOSIS — M5416 Radiculopathy, lumbar region: Secondary | ICD-10-CM | POA: Diagnosis not present

## 2018-12-08 DIAGNOSIS — M25552 Pain in left hip: Secondary | ICD-10-CM | POA: Diagnosis not present

## 2018-12-28 ENCOUNTER — Telehealth: Payer: Self-pay

## 2018-12-28 DIAGNOSIS — F411 Generalized anxiety disorder: Secondary | ICD-10-CM | POA: Diagnosis not present

## 2018-12-28 DIAGNOSIS — M5432 Sciatica, left side: Secondary | ICD-10-CM

## 2018-12-28 DIAGNOSIS — F321 Major depressive disorder, single episode, moderate: Secondary | ICD-10-CM | POA: Diagnosis not present

## 2018-12-28 NOTE — Telephone Encounter (Signed)
Referral placed.

## 2018-12-28 NOTE — Telephone Encounter (Signed)
Fax from [facility].  Patient was referred to [facility] but needs referral from PCP because patient has medicaid.  See notes from [facility], patient see's Marin Olp, PA-C

## 2019-01-04 ENCOUNTER — Other Ambulatory Visit: Payer: Self-pay

## 2019-01-04 ENCOUNTER — Encounter: Payer: Self-pay | Admitting: Family Medicine

## 2019-01-04 ENCOUNTER — Ambulatory Visit (INDEPENDENT_AMBULATORY_CARE_PROVIDER_SITE_OTHER): Payer: Medicaid Other | Admitting: Family Medicine

## 2019-01-04 VITALS — BP 137/101 | Ht 59.0 in | Wt 173.0 lb

## 2019-01-04 DIAGNOSIS — I10 Essential (primary) hypertension: Secondary | ICD-10-CM | POA: Diagnosis not present

## 2019-01-04 DIAGNOSIS — M5432 Sciatica, left side: Secondary | ICD-10-CM

## 2019-01-04 DIAGNOSIS — R05 Cough: Secondary | ICD-10-CM | POA: Diagnosis not present

## 2019-01-04 DIAGNOSIS — R059 Cough, unspecified: Secondary | ICD-10-CM

## 2019-01-04 DIAGNOSIS — K219 Gastro-esophageal reflux disease without esophagitis: Secondary | ICD-10-CM | POA: Diagnosis not present

## 2019-01-04 DIAGNOSIS — F902 Attention-deficit hyperactivity disorder, combined type: Secondary | ICD-10-CM

## 2019-01-04 MED ORDER — AMLODIPINE BESYLATE 5 MG PO TABS: 5.0000 mg | ORAL_TABLET | Freq: Every day | ORAL | 1 refills | Status: DC

## 2019-01-04 MED ORDER — GUAIFENESIN-CODEINE 100-10 MG/5ML PO SOLN: 5.0000 mL | Freq: Every evening | ORAL | 0 refills | Status: DC | PRN

## 2019-01-04 MED ORDER — GABAPENTIN 600 MG PO TABS: 600.0000 mg | ORAL_TABLET | Freq: Three times a day (TID) | ORAL | 1 refills | Status: DC | PRN

## 2019-01-04 MED ORDER — AMPHETAMINE-DEXTROAMPHETAMINE 20 MG PO TABS: 20.0000 mg | ORAL_TABLET | Freq: Two times a day (BID) | ORAL | 0 refills | Status: DC

## 2019-01-04 MED ORDER — UMECLIDINIUM-VILANTEROL 62.5-25 MCG/INH IN AEPB: 1.0000 | INHALATION_SPRAY | Freq: Every day | RESPIRATORY_TRACT | 3 refills | Status: DC

## 2019-01-04 MED ORDER — METOPROLOL SUCCINATE ER 100 MG PO TB24: 100.0000 mg | ORAL_TABLET | Freq: Every day | ORAL | 0 refills | Status: DC

## 2019-01-04 MED ORDER — BENAZEPRIL HCL 40 MG PO TABS: 40.0000 mg | ORAL_TABLET | Freq: Every day | ORAL | 1 refills | Status: DC

## 2019-01-04 MED ORDER — PANTOPRAZOLE SODIUM 40 MG PO TBEC: 40.0000 mg | DELAYED_RELEASE_TABLET | Freq: Two times a day (BID) | ORAL | 1 refills | Status: DC

## 2019-01-04 NOTE — Progress Notes (Signed)
BP (!) 137/101 (BP Location: Right Wrist, Patient Position: Sitting, Cuff Size: Normal)    Ht 4\' 11"  (1.499 m)    Wt 173 lb (78.5 kg)    BMI 34.94 kg/m    Subjective:    Patient ID: Laurie Roman, female    DOB: 01/10/1976, 43 y.o.   MRN: [id number]  HPI: Laurie Roman is a [age] y.o. female  Chief Complaint  Patient presents with   Hypertension    Follow-up, no complaints     This visit was completed via WebEx due to the restrictions of the COVID-19 pandemic. All issues as above were discussed and addressed. Physical exam was done as above through visual confirmation on WebEx. If it was felt that the patient should be evaluated in the office, they were directed there. The patient verbally consented to this visit.  Location of the patient: home  Location of the provider: work  Those involved with this call:   Provider: Roosvelt Maserachel Mats Jeanlouis, PA-C  CMA: Myrtha MantisKeri Bullock, CMA  Front Desk/Registration: Harriet PhoJoliza Johnson   Time spent on call: 45 minutes with patient face to face via video conference. More than 50% of this time was spent in counseling and coordination of care. 20 minutes total spent in review of patient's record and preparation of their chart. I verified patient identity using two factors (patient name and date of birth). Patient consents verbally to being seen via telemedicine visit today.   Patient presents today for HTN f/u but also has numerous other concerns she would like to discuss today.   HTN - BPs remaining high, typically in the 150s/100s. She had initially attributed it to some stress with family health issues and other things going on but her numbers aren't budging despite trying to reduce stress. Taking her medicines faithfully. Denies CP, SOB, HAs.   ADHD - due for 3 month f/u, still doing well on current regimen of adderall 20 mg BID prn. Does not take on weekends all the time. Having to take the second dose fairly early on in the afternoons or else it keeps her up  at night. Does feel a significant benefit with organizing her thoughts, completing tasks.   Still dealing with significant pain and flares of her left sided sciatica. Following with Neurosurgery, who are now recommending Pain Management consultation. She is awaiting this appt. On gabapentin 300 mg TID which does help some. No side effects there.   Dealing with a continuous cough the past 4-6 weeks since dealing with allergy flare and sinus infection. Completed abx with some mild relief, taking multiple OTC cough syrups and tessalon perles with no benefit. Codeine cough syrups provide some relief at bedtime otherwise no relief from sxs. Does have a hx of asthma and takes symbicort and albuterol prn for this. Nonsmoker, but husband smokes.   GERD - doing better with increased frequency of protonix doses. Having good relief taking 40 mg protonix BID. Discussed some diet modifications to help reduce her need for these. Following with GI for diarrhea concerns, she states she will mention her significant GERD to them at next visit. No hematemesis, N/V/D, abdominal pain.   Relevant past medical, surgical, family and social history reviewed and updated as indicated. Interim medical history since our last visit reviewed. Allergies and medications reviewed and updated.  Review of Systems  Per HPI unless specifically indicated above     Objective:    BP (!) 137/101 (BP Location: Right Wrist, Patient Position: Sitting, Cuff Size: Normal)  Ht 4\' 11"  (1.499 m)    Wt 173 lb (78.5 kg)    BMI 34.94 kg/m   Wt Readings from Last 3 Encounters:  01/04/19 173 lb (78.5 kg)  12/02/18 167 lb (75.8 kg)  09/29/18 167 lb (75.8 kg)    Physical Exam Vitals signs and nursing note reviewed.  Constitutional:      General: She is not in acute distress.    Appearance: Normal appearance.  HENT:     Head: Atraumatic.     Right Ear: External ear normal.     Left Ear: External ear normal.     Nose: Nose normal. No  congestion.     Mouth/Throat:     Mouth: Mucous membranes are moist.     Pharynx: Oropharynx is clear. No posterior oropharyngeal erythema.  Eyes:     Extraocular Movements: Extraocular movements intact.     Conjunctiva/sclera: Conjunctivae normal.  Neck:     Musculoskeletal: Normal range of motion.  Cardiovascular:     Comments: Unable to assess via virtual visit Pulmonary:     Effort: Pulmonary effort is normal. No respiratory distress.  Musculoskeletal: Normal range of motion.  Skin:    General: Skin is dry.     Findings: No erythema.  Neurological:     Mental Status: She is alert and oriented to person, place, and time.  Psychiatric:        Mood and Affect: Mood normal.        Thought Content: Thought content normal.        Judgment: Judgment normal.     Results for orders placed or performed in visit on 08/11/18  Ova and parasite examination   Specimen: Stool   ST  Result Value Ref Range   OVA + PARASITE EXAM Final report    O&P result 1 Comment   Fecal leukocytes   Specimen: Stool   ST  Result Value Ref Range   White Blood Cells (WBC), Stool Final report None Seen   Result 1 Comment   Stool C-Diff Toxin Assay   Specimen: Stool   ST  Result Value Ref Range   C difficile Toxins A+B, EIA Negative Negative  Microscopic Examination   URINE  Result Value Ref Range   WBC, UA 0-5 0 - 5 /hpf   RBC, UA None seen 0 - 2 /hpf   Epithelial Cells (non renal) 0-10 0 - 10 /hpf   Casts Present None seen /lpf   Cast Type Hyaline casts N/A   Bacteria, UA Many (A) None seen/Few  Urine Culture, Reflex   URINE  Result Value Ref Range   Urine Culture, Routine Final report (A)    Organism ID, Bacteria Enterococcus faecalis (A)    ORGANISM ID, BACTERIA Comment    Antimicrobial Susceptibility Comment   CBC with Differential/Platelet  Result Value Ref Range   WBC 12.4 (H) 3.4 - 10.8 x10E3/uL   RBC 4.34 3.77 - 5.28 x10E6/uL   Hemoglobin 14.1 11.1 - 15.9 g/dL   Hematocrit  96.039.7 45.434.0 - 46.6 %   MCV 92 79 - 97 fL   MCH 32.5 26.6 - 33.0 pg   MCHC 35.5 31.5 - 35.7 g/dL   RDW 09.812.4 11.911.7 - 14.715.4 %   Platelets 288 150 - 450 x10E3/uL   Neutrophils 59 Not Estab. %   Lymphs 29 Not Estab. %   Monocytes 8 Not Estab. %   Eos 2 Not Estab. %   Basos 1 Not Estab. %  Neutrophils Absolute 7.4 (H) 1.4 - 7.0 x10E3/uL   Lymphocytes Absolute 3.6 (H) 0.7 - 3.1 x10E3/uL   Monocytes Absolute 1.0 (H) 0.1 - 0.9 x10E3/uL   EOS (ABSOLUTE) 0.2 0.0 - 0.4 x10E3/uL   Basophils Absolute 0.1 0.0 - 0.2 x10E3/uL   Immature Granulocytes 1 Not Estab. %   Immature Grans (Abs) 0.1 0.0 - 0.1 x10E3/uL  Comprehensive metabolic panel  Result Value Ref Range   Glucose 74 65 - 99 mg/dL   BUN 6 6 - 24 mg/dL   Creatinine, Ser 1.610.73 0.57 - 1.00 mg/dL   GFR calc non Af Amer 102 >59 mL/min/1.73   GFR calc Af Amer 117 >59 mL/min/1.73   BUN/Creatinine Ratio 8 (L) 9 - 23   Sodium 138 134 - 144 mmol/L   Potassium 5.4 (H) 3.5 - 5.2 mmol/L   Chloride 96 96 - 106 mmol/L   CO2 21 20 - 29 mmol/L   Calcium 9.6 8.7 - 10.2 mg/dL   Total Protein 7.3 6.0 - 8.5 g/dL   Albumin 4.7 3.8 - 4.8 g/dL   Globulin, Total 2.6 1.5 - 4.5 g/dL   Albumin/Globulin Ratio 1.8 1.2 - 2.2   Bilirubin Total 0.3 0.0 - 1.2 mg/dL   Alkaline Phosphatase 94 39 - 117 IU/L   AST 57 (H) 0 - 40 IU/L   ALT 45 (H) 0 - 32 IU/L  Lipase  Result Value Ref Range   Lipase 21 14 - 72 U/L  TSH  Result Value Ref Range   TSH 3.080 0.450 - 4.500 uIU/mL  UA/M w/rflx Culture, Routine   Specimen: Urine   URINE  Result Value Ref Range   Specific Gravity, UA 1.020 1.005 - 1.030   pH, UA 6.5 5.0 - 7.5   Color, UA Yellow Yellow   Appearance Ur Clear Clear   Leukocytes, UA Negative Negative   Protein, UA 1+ (A) Negative/Trace   Glucose, UA Negative Negative   Ketones, UA Negative Negative   RBC, UA Negative Negative   Bilirubin, UA Negative Negative   Urobilinogen, Ur 0.2 0.2 - 1.0 mg/dL   Nitrite, UA Negative Negative   Microscopic  Examination See below:    Urinalysis Reflex Comment       Assessment & Plan:   Problem List Items Addressed This Visit      Cardiovascular and Mediastinum   Essential hypertension - Primary    Increase metoprolol to 100 mg XL, continue remainder of regimen as before. Continue home monitoring and call with persistent abnormal home readings      Relevant Medications   metoprolol succinate (TOPROL-XL) 100 MG 24 hr tablet   benazepril (LOTENSIN) 40 MG tablet   amLODipine (NORVASC) 5 MG tablet     Digestive   Gastroesophageal reflux disease without esophagitis    Continue BID dosing for now, will discuss with GI at her next appt if still requiring such high doses and daily use. Diet modifications reviewed      Relevant Medications   pantoprazole (PROTONIX) 40 MG tablet     Nervous and Auditory   Left sided sciatica    Awaiting consultation with Pain Management, recommended by Neurosurgery at this time due to failure of other methods of management. Will increase gabapentin in meantime to 600 mg TID prn.       Relevant Medications   gabapentin (NEURONTIN) 600 MG tablet   amphetamine-dextroamphetamine (ADDERALL) 20 MG tablet (Start on 03/07/2019)   amphetamine-dextroamphetamine (ADDERALL) 20 MG tablet (Start on 02/04/2019)  amphetamine-dextroamphetamine (ADDERALL) 20 MG tablet     Other   ADHD    Stable and under good control, continue current regimen. [place] Controlled substance database reviewed and appropriate       Other Visit Diagnoses    Cough       Unclear if related to asthma/allergies, ACEI, or infectious cause. Will add anoro and use allergy regimen consistently. Small supply of Robitussin AC given       Follow up plan: Return in about 2 weeks (around 01/18/2019) for BP, cough f/u.

## 2019-01-09 ENCOUNTER — Telehealth: Payer: Self-pay

## 2019-01-09 NOTE — Assessment & Plan Note (Signed)
Stable and under good control, continue current regimen. [place] Controlled substance database reviewed and appropriate

## 2019-01-09 NOTE — Assessment & Plan Note (Signed)
Continue BID dosing for now, will discuss with GI at her next appt if still requiring such high doses and daily use. Diet modifications reviewed

## 2019-01-09 NOTE — Telephone Encounter (Signed)
Prior Authorization initiated via [employer] for [employer] 62[employer]5-25MCG/INH aerosol powde Confirmation #: [id number]

## 2019-01-09 NOTE — Assessment & Plan Note (Signed)
Increase metoprolol to 100 mg XL, continue remainder of regimen as before. Continue home monitoring and call with persistent abnormal home readings

## 2019-01-09 NOTE — Assessment & Plan Note (Signed)
Awaiting consultation with Pain Management, recommended by Neurosurgery at this time due to failure of other methods of management. Will increase gabapentin in meantime to 600 mg TID prn.

## 2019-01-17 ENCOUNTER — Ambulatory Visit: Payer: Medicaid Other | Admitting: Student in an Organized Health Care Education/Training Program

## 2019-01-19 NOTE — Telephone Encounter (Signed)
PA Approved

## 2019-02-03 DIAGNOSIS — F411 Generalized anxiety disorder: Secondary | ICD-10-CM | POA: Diagnosis not present

## 2019-02-03 DIAGNOSIS — F321 Major depressive disorder, single episode, moderate: Secondary | ICD-10-CM | POA: Diagnosis not present

## 2019-02-07 ENCOUNTER — Ambulatory Visit: Payer: Medicaid Other | Admitting: Student in an Organized Health Care Education/Training Program

## 2019-03-07 DIAGNOSIS — F411 Generalized anxiety disorder: Secondary | ICD-10-CM | POA: Diagnosis not present

## 2019-03-07 DIAGNOSIS — F321 Major depressive disorder, single episode, moderate: Secondary | ICD-10-CM | POA: Diagnosis not present

## 2019-03-09 NOTE — Telephone Encounter (Signed)
Needs appt

## 2019-03-10 ENCOUNTER — Telehealth: Payer: Self-pay | Admitting: Family Medicine

## 2019-03-10 DIAGNOSIS — G43009 Migraine without aura, not intractable, without status migrainosus: Secondary | ICD-10-CM | POA: Diagnosis not present

## 2019-03-10 DIAGNOSIS — G43809 Other migraine, not intractable, without status migrainosus: Secondary | ICD-10-CM | POA: Diagnosis not present

## 2019-03-10 NOTE — Telephone Encounter (Signed)
Pt would like a call back as soon as possible due to headaches.  Pt wants appt today.  Pt asked to speak with Laurie Roman or the nurse for her, to get a Rx to help with her migraines. Pt states no one told her she needed appt.  Please call back

## 2019-03-13 ENCOUNTER — Ambulatory Visit: Payer: Medicaid Other | Admitting: Student in an Organized Health Care Education/Training Program

## 2019-03-14 ENCOUNTER — Ambulatory Visit: Payer: Medicaid Other | Admitting: Family Medicine

## 2019-03-16 ENCOUNTER — Ambulatory Visit (INDEPENDENT_AMBULATORY_CARE_PROVIDER_SITE_OTHER): Payer: Medicaid Other | Admitting: Family Medicine

## 2019-03-16 ENCOUNTER — Other Ambulatory Visit: Payer: Self-pay

## 2019-03-16 ENCOUNTER — Other Ambulatory Visit: Payer: Self-pay | Admitting: Family Medicine

## 2019-03-16 ENCOUNTER — Encounter: Payer: Self-pay | Admitting: Family Medicine

## 2019-03-16 DIAGNOSIS — H109 Unspecified conjunctivitis: Secondary | ICD-10-CM | POA: Diagnosis not present

## 2019-03-16 DIAGNOSIS — G43009 Migraine without aura, not intractable, without status migrainosus: Secondary | ICD-10-CM

## 2019-03-16 DIAGNOSIS — G43909 Migraine, unspecified, not intractable, without status migrainosus: Secondary | ICD-10-CM

## 2019-03-16 MED ORDER — RIZATRIPTAN BENZOATE 10 MG PO TABS: 10.0000 mg | ORAL_TABLET | ORAL | 3 refills | Status: DC | PRN

## 2019-03-16 MED ORDER — BUTORPHANOL TARTRATE 10 MG/ML NA SOLN: 1.0000 | NASAL | 0 refills | Status: DC | PRN

## 2019-03-16 MED ORDER — AIMOVIG 70 MG/ML ~~LOC~~ SOAJ: 70.0000 mg | SUBCUTANEOUS | 3 refills | Status: DC

## 2019-03-16 MED ORDER — POLYMYXIN B-TRIMETHOPRIM 10000-0.1 UNIT/ML-% OP SOLN: 1.0000 [drp] | Freq: Four times a day (QID) | OPHTHALMIC | 0 refills | Status: DC

## 2019-03-16 NOTE — Telephone Encounter (Signed)
Copied from [place] [id number]. Topic: Quick Communication - Rx Refill/Question >> Mar 16, 2019  4:40 PM Rainey Pines A wrote: Medication: butorphanol (STADOL) 10 MG/ML nasal spray (Patient stated that this particular medication was sent to wrong pharmacy.)  Has the patient contacted their pharmacy? Yes (Agent: If no, request that the patient contact the pharmacy for the refill.) (Agent: If yes, when and what did the pharmacy advise?)Contact PCP  Preferred Pharmacy (with phone number or street name): [id number] - [place], [address] [phone] (Phone) [phone] (Fax)    Agent: Please be advised that RX refills may take up to 3 business days. We ask that you follow-up with your pharmacy.

## 2019-03-16 NOTE — Telephone Encounter (Signed)
Attempted to reorder medication to send to pharmacy but was not able to send electronically.

## 2019-03-16 NOTE — Assessment & Plan Note (Signed)
Will resend aimovig to see if better covered now, and will also refer to Neurology for further management. Continue maxalt, will provide one small supply of stadol that will need to last indefinitely. Will not refill. Patient agreeable to this plan. Risks reviewed at length.

## 2019-03-16 NOTE — Progress Notes (Signed)
There were no vitals taken for this visit.   Subjective:    Patient ID: Laurie Roman, female    DOB: 07/24/1975, 43 y.o.   MRN: [id number]  HPI: Laurie Roman is a 43 y.o. female  Chief Complaint  Patient presents with  . Eye Problem    pt states she has been having swelling and redness in her right eye for the past 2 days. States it is crusty when she wakes up.     . This visit was completed via WebEx due to the restrictions of the COVID-19 pandemic. All issues as above were discussed and addressed. Physical exam was done as above through visual confirmation on WebEx. If it was felt that the patient should be evaluated in the office, they were directed there. The patient verbally consented to this visit. . Location of the patient: parked car . Location of the provider: work . Those involved with this call:  . Provider: Roosvelt Maserachel Junelle Hashemi, PA-C . CMA: Wilhemena DurieBrittany Russell, CMA . Front Desk/Registration: Adela Portshristan Williamson  . Time spent on call: 25 minutes with patient face to face via video conference. More than 50% of this time was spent in counseling and coordination of care. 5 minutes total spent in review of patient's record and preparation of their chart. I verified patient identity using two factors (patient name and date of birth). Patient consents verbally to being seen via telemedicine visit today.   Right eye swelling, redness, tenderness the past two weeks. States her tear duct was swollen and tender last week and now the last 2 days crusting, discharge, itching. Not trying anything but a warm compress here and there. No other sxs, sick contacts, new makeup products or exposures.   Migraines still severe, maxalt helps slightly but having to take at least 20 per month. Has failed numerous preventatives in the past and finally did well on aimovig samples but insurance would not cover it. Wanting to try going back on it, which insurance had requested a Neurology consultation last year in  order to cover it. She is ready to go forward with this if needed. Has been to UC several times a month the past few months for toradol shots and barely getting by. Used to be on stadol prn, requesting refill.   Relevant past medical, surgical, family and social history reviewed and updated as indicated. Interim medical history since our last visit reviewed. Allergies and medications reviewed and updated.  Review of Systems  Per HPI unless specifically indicated above     Objective:    There were no vitals taken for this visit.  Wt Readings from Last 3 Encounters:  01/04/19 173 lb (78.5 kg)  12/02/18 167 lb (75.8 kg)  09/29/18 167 lb (75.8 kg)    Physical Exam Vitals signs and nursing note reviewed.  Constitutional:      General: She is not in acute distress.    Appearance: Normal appearance.  HENT:     Head: Atraumatic.     Right Ear: External ear normal.     Left Ear: External ear normal.     Nose: Nose normal. No congestion.     Mouth/Throat:     Mouth: Mucous membranes are moist.     Pharynx: Oropharynx is clear. No posterior oropharyngeal erythema.  Eyes:     General:        Right eye: Discharge present.     Extraocular Movements: Extraocular movements intact.     Comments: Right eye appears  mildly puffy and pink conjunctiva  Neck:     Musculoskeletal: Normal range of motion.  Cardiovascular:     Comments: Unable to assess via virtual visit Pulmonary:     Effort: Pulmonary effort is normal. No respiratory distress.  Musculoskeletal: Normal range of motion.  Skin:    General: Skin is dry.     Findings: No erythema.  Neurological:     Mental Status: She is alert and oriented to person, place, and time.  Psychiatric:        Mood and Affect: Mood normal.        Thought Content: Thought content normal.        Judgment: Judgment normal.     Results for orders placed or performed in visit on 08/11/18  Ova and parasite examination   Specimen: Stool   ST  Result  Value Ref Range   OVA + PARASITE EXAM Final report    O&P result 1 Comment   Fecal leukocytes   Specimen: Stool   ST  Result Value Ref Range   White Blood Cells (WBC), Stool Final report None Seen   Result 1 Comment   Stool C-Diff Toxin Assay   Specimen: Stool   ST  Result Value Ref Range   C difficile Toxins A+B, EIA Negative Negative  Microscopic Examination   URINE  Result Value Ref Range   WBC, UA 0-5 0 - 5 /hpf   RBC, UA None seen 0 - 2 /hpf   Epithelial Cells (non renal) 0-10 0 - 10 /hpf   Casts Present None seen /lpf   Cast Type Hyaline casts N/A   Bacteria, UA Many (A) None seen/Few  Urine Culture, Reflex   URINE  Result Value Ref Range   Urine Culture, Routine Final report (A)    Organism ID, Bacteria Enterococcus faecalis (A)    ORGANISM ID, BACTERIA Comment    Antimicrobial Susceptibility Comment   CBC with Differential/Platelet  Result Value Ref Range   WBC 12.4 (H) 3.4 - 10.8 x10E3/uL   RBC 4.34 3.77 - 5.28 x10E6/uL   Hemoglobin 14.1 11.1 - 15.9 g/dL   Hematocrit 39.7 34.0 - 46.6 %   MCV 92 79 - 97 fL   MCH 32.5 26.6 - 33.0 pg   MCHC 35.5 31.5 - 35.7 g/dL   RDW 12.4 11.7 - 15.4 %   Platelets 288 150 - 450 x10E3/uL   Neutrophils 59 Not Estab. %   Lymphs 29 Not Estab. %   Monocytes 8 Not Estab. %   Eos 2 Not Estab. %   Basos 1 Not Estab. %   Neutrophils Absolute 7.4 (H) 1.4 - 7.0 x10E3/uL   Lymphocytes Absolute 3.6 (H) 0.7 - 3.1 x10E3/uL   Monocytes Absolute 1.0 (H) 0.1 - 0.9 x10E3/uL   EOS (ABSOLUTE) 0.2 0.0 - 0.4 x10E3/uL   Basophils Absolute 0.1 0.0 - 0.2 x10E3/uL   Immature Granulocytes 1 Not Estab. %   Immature Grans (Abs) 0.1 0.0 - 0.1 x10E3/uL  Comprehensive metabolic panel  Result Value Ref Range   Glucose 74 65 - 99 mg/dL   BUN 6 6 - 24 mg/dL   Creatinine, Ser 0.73 0.57 - 1.00 mg/dL   GFR calc non Af Amer 102 >59 mL/min/1.73   GFR calc Af Amer 117 >59 mL/min/1.73   BUN/Creatinine Ratio 8 (L) 9 - 23   Sodium 138 134 - 144 mmol/L    Potassium 5.4 (H) 3.5 - 5.2 mmol/L   Chloride 96 96 - 106  mmol/L   CO2 21 20 - 29 mmol/L   Calcium 9.6 8.7 - 10.2 mg/dL   Total Protein 7.3 6.0 - 8.5 g/dL   Albumin 4.7 3.8 - 4.8 g/dL   Globulin, Total 2.6 1.5 - 4.5 g/dL   Albumin/Globulin Ratio 1.8 1.2 - 2.2   Bilirubin Total 0.3 0.0 - 1.2 mg/dL   Alkaline Phosphatase 94 39 - 117 IU/L   AST 57 (H) 0 - 40 IU/L   ALT 45 (H) 0 - 32 IU/L  Lipase  Result Value Ref Range   Lipase 21 14 - 72 U/L  TSH  Result Value Ref Range   TSH 3.080 0.450 - 4.500 uIU/mL  UA/M w/rflx Culture, Routine   Specimen: Urine   URINE  Result Value Ref Range   Specific Gravity, UA 1.020 1.005 - 1.030   pH, UA 6.5 5.0 - 7.5   Color, UA Yellow Yellow   Appearance Ur Clear Clear   Leukocytes, UA Negative Negative   Protein, UA 1+ (A) Negative/Trace   Glucose, UA Negative Negative   Ketones, UA Negative Negative   RBC, UA Negative Negative   Bilirubin, UA Negative Negative   Urobilinogen, Ur 0.2 0.2 - 1.0 mg/dL   Nitrite, UA Negative Negative   Microscopic Examination See below:    Urinalysis Reflex Comment       Assessment & Plan:   Problem List Items Addressed This Visit      Cardiovascular and Mediastinum   Migraine    Will resend aimovig to see if better covered now, and will also refer to Neurology for further management. Continue maxalt, will provide one small supply of stadol that will need to last indefinitely. Will not refill. Patient agreeable to this plan. Risks reviewed at length.       Relevant Medications   rizatriptan (MAXALT) 10 MG tablet   butorphanol (STADOL) 10 MG/ML nasal spray   Erenumab-aooe (AIMOVIG) 70 MG/ML SOAJ   Other Relevant Orders   Ambulatory referral to Neurology    Other Visit Diagnoses    Bacterial conjunctivitis of right eye    -  Primary   Polytrim drops, warm compresses. Avoid makeup at least until issue resolves. F/u if not improving       Follow up plan: Return in about 2 weeks (around 03/30/2019) for  ADHD, BP f/u in person - pt will call to schedule.

## 2019-03-17 NOTE — Telephone Encounter (Signed)
I think Tiffany had returned their call yesterday but I will route for confirmation

## 2019-03-17 NOTE — Telephone Encounter (Signed)
Pharmacy was called yesterday by Laurie Roman.

## 2019-03-20 ENCOUNTER — Telehealth: Payer: Self-pay

## 2019-03-20 NOTE — Telephone Encounter (Signed)
PA for Butorphanol (Stadol) submitted and approved.   Confirmation number: [id number] PA Number: [id number]

## 2019-04-04 DIAGNOSIS — F411 Generalized anxiety disorder: Secondary | ICD-10-CM | POA: Diagnosis not present

## 2019-04-04 DIAGNOSIS — F321 Major depressive disorder, single episode, moderate: Secondary | ICD-10-CM | POA: Diagnosis not present

## 2019-04-05 ENCOUNTER — Ambulatory Visit: Payer: Medicaid Other | Admitting: Family Medicine

## 2019-04-05 DIAGNOSIS — G43019 Migraine without aura, intractable, without status migrainosus: Secondary | ICD-10-CM | POA: Diagnosis not present

## 2019-04-28 ENCOUNTER — Telehealth: Payer: Self-pay | Admitting: Family Medicine

## 2019-04-28 NOTE — Telephone Encounter (Signed)
Copied from [place] [id number]. Topic: General - Inquiry >> Apr 28, 2019  4:00 PM Selinda Flavin B, [place] wrote: Reason for CRM: Patient calling and would like to know if Apolonio Schneiders is okay with her scheduling an in office visit in 1 month to do refills on Adderall? States that she just got a new job and cannot ask off to come to the appointment yet. Will be out of medication on 04/30/2019. Please advise.  CB#: [phone]

## 2019-05-01 ENCOUNTER — Encounter: Payer: Self-pay | Admitting: Family Medicine

## 2019-05-01 MED ORDER — AMPHETAMINE-DEXTROAMPHETAMINE 20 MG PO TABS: 20.0000 mg | ORAL_TABLET | Freq: Two times a day (BID) | ORAL | 0 refills | Status: DC

## 2019-05-01 NOTE — Telephone Encounter (Signed)
Called pt she states that her husband is going out of town to where she is, so she would like medication sent to [place] [address] so that he can pick it up for her.

## 2019-05-01 NOTE — Telephone Encounter (Signed)
She had sent a mychart message stating she couldn't come in because she is out of town for a family matter - please see what is going on and where she would need a bridge supply sent. I told her via mychart message I would send in 2 weeks until she could get back in town and have a visit

## 2019-05-01 NOTE — Telephone Encounter (Signed)
Rx sent for 14 days as discussed

## 2019-05-15 DIAGNOSIS — F411 Generalized anxiety disorder: Secondary | ICD-10-CM | POA: Diagnosis not present

## 2019-05-15 DIAGNOSIS — F321 Major depressive disorder, single episode, moderate: Secondary | ICD-10-CM | POA: Diagnosis not present

## 2019-05-15 DIAGNOSIS — R41840 Attention and concentration deficit: Secondary | ICD-10-CM | POA: Diagnosis not present

## 2019-05-18 ENCOUNTER — Other Ambulatory Visit: Payer: Self-pay | Admitting: Family Medicine

## 2019-05-18 ENCOUNTER — Ambulatory Visit (INDEPENDENT_AMBULATORY_CARE_PROVIDER_SITE_OTHER): Payer: Medicaid Other | Admitting: Family Medicine

## 2019-05-18 ENCOUNTER — Other Ambulatory Visit: Payer: Self-pay

## 2019-05-18 ENCOUNTER — Encounter: Payer: Self-pay | Admitting: Family Medicine

## 2019-05-18 VITALS — BP 144/93 | HR 81 | Wt 157.0 lb

## 2019-05-18 DIAGNOSIS — J4521 Mild intermittent asthma with (acute) exacerbation: Secondary | ICD-10-CM

## 2019-05-18 DIAGNOSIS — F902 Attention-deficit hyperactivity disorder, combined type: Secondary | ICD-10-CM

## 2019-05-18 DIAGNOSIS — I10 Essential (primary) hypertension: Secondary | ICD-10-CM

## 2019-05-18 MED ORDER — ALBUTEROL SULFATE HFA 108 (90 BASE) MCG/ACT IN AERS: 2.0000 | INHALATION_SPRAY | Freq: Four times a day (QID) | RESPIRATORY_TRACT | 3 refills | Status: DC | PRN

## 2019-05-18 MED ORDER — AMPHETAMINE-DEXTROAMPHETAMINE 20 MG PO TABS: 20.0000 mg | ORAL_TABLET | Freq: Two times a day (BID) | ORAL | 0 refills | Status: DC

## 2019-05-18 MED ORDER — HYDROCOD POLST-CPM POLST ER 10-8 MG/5ML PO SUER: 5.0000 mL | Freq: Two times a day (BID) | ORAL | 0 refills | Status: DC | PRN

## 2019-05-18 NOTE — Telephone Encounter (Signed)
Pt calling back and stated that the that Rx was only for two weeks also. Pt would like to be able to pick these up today if possible. Please advise

## 2019-05-18 NOTE — Telephone Encounter (Signed)
Pt would like a call back from the office if possible.

## 2019-05-18 NOTE — Telephone Encounter (Signed)
Called patient to let her know that her Rx was sent to her pharmacy. Patient stated that she needs a new Rx for adderal that can pick up today. She stated that her last refill was 05/01/19 for two weeks only and now she is out of them and she can't wait until 05/31/19 when the next Rx on her med list will drop off. Please advise

## 2019-05-18 NOTE — Telephone Encounter (Signed)
The script was meant to say 07/01/2019 - new script sent

## 2019-05-18 NOTE — Telephone Encounter (Signed)
Pt states her  amphetamine-dextroamphetamine (ADDERALL) 20 MG tablet rx was not sent correctly to the pharmacy  One of them states. Earliest Fill Date: 08/01/2019  Pt would like a call back to advise if this can be resent? [place], [place] - [place] Phone:  [phone]  Fax:  [phone]

## 2019-05-18 NOTE — Telephone Encounter (Signed)
Please resend adderall script for November, please change the date to 05/01/19 it is 04/30/20 right now, which Page at [facility] has discontinued.

## 2019-05-18 NOTE — Progress Notes (Signed)
BP (!) 144/93   Pulse 81   Wt 157 lb (71.2 kg)   BMI 31.71 kg/m    Subjective:    Patient ID: Laurie Roman, female    DOB: 1976-02-02, 43 y.o.   MRN: [id number]  HPI: Laurie Roman is a 43 y.o. female  Chief Complaint  Patient presents with  . ADHD    adderall refill    . This visit was completed via WebEx due to the restrictions of the COVID-19 pandemic. All issues as above were discussed and addressed. Physical exam was done as above through visual confirmation on WebEx. If it was felt that the patient should be evaluated in the office, they were directed there. The patient verbally consented to this visit. . Location of the patient: home . Location of the provider: work . Those involved with this call:  . Provider: Roosvelt Maser, PA-C . CMA: Elton Sin, CMA . Front Desk/Registration: Harriet Pho  . Time spent on call: 25 minutes with patient face to face via video conference. More than 50% of this time was spent in counseling and coordination of care. 5 minutes total spent in review of patient's record and preparation of their chart. I verified patient identity using two factors (patient name and date of birth). Patient consents verbally to being seen via telemedicine visit today.   ADD - continues to do well on 20 mg adderall 1-2 times daily. Denies side effects, feels it benefits with focus and task completion. No sleep issues, palpitations, CP, appetite concerns.   Daughter is waiting on test results for COVID 19, had some sick contacts over holidays. So far not having any sxs herself just her typical allergies/cough with a worse cough than normal the past week or so. Will go get tested if daughter positive. Isolating at home with family members now.   Relevant past medical, surgical, family and social history reviewed and updated as indicated. Interim medical history since our last visit reviewed. Allergies and medications reviewed and updated.  Review of Systems   Per HPI unless specifically indicated above     Objective:    BP (!) 144/93   Pulse 81   Wt 157 lb (71.2 kg)   BMI 31.71 kg/m   Wt Readings from Last 3 Encounters:  05/18/19 157 lb (71.2 kg)  01/04/19 173 lb (78.5 kg)  12/02/18 167 lb (75.8 kg)    Physical Exam Vitals and nursing note reviewed.  Constitutional:      General: She is not in acute distress.    Appearance: Normal appearance.  HENT:     Head: Atraumatic.     Right Ear: External ear normal.     Left Ear: External ear normal.     Nose: Nose normal. No congestion.     Mouth/Throat:     Mouth: Mucous membranes are moist.     Pharynx: Oropharynx is clear. Posterior oropharyngeal erythema present.  Eyes:     Extraocular Movements: Extraocular movements intact.     Conjunctiva/sclera: Conjunctivae normal.  Cardiovascular:     Comments: Unable to assess via virtual visit Pulmonary:     Effort: Pulmonary effort is normal. No respiratory distress.  Musculoskeletal:        General: Normal range of motion.     Cervical back: Normal range of motion.  Skin:    General: Skin is dry.     Findings: No erythema.  Neurological:     Mental Status: She is alert and oriented to  person, place, and time.  Psychiatric:        Mood and Affect: Mood normal.        Thought Content: Thought content normal.        Judgment: Judgment normal.     Results for orders placed or performed in visit on 08/11/18  Ova and parasite examination   Specimen: Stool   ST  Result Value Ref Range   OVA + PARASITE EXAM Final report    O&P result 1 Comment   Fecal leukocytes   Specimen: Stool   ST  Result Value Ref Range   White Blood Cells (WBC), Stool Final report None Seen   Result 1 Comment   Stool C-Diff Toxin Assay   Specimen: Stool   ST  Result Value Ref Range   C difficile Toxins A+B, EIA Negative Negative  Microscopic Examination   URINE  Result Value Ref Range   WBC, UA 0-5 0 - 5 /hpf   RBC, UA None seen 0 - 2 /hpf    Epithelial Cells (non renal) 0-10 0 - 10 /hpf   Casts Present None seen /lpf   Cast Type Hyaline casts N/A   Bacteria, UA Many (A) None seen/Few  Urine Culture, Reflex   URINE  Result Value Ref Range   Urine Culture, Routine Final report (A)    Organism ID, Bacteria Enterococcus faecalis (A)    ORGANISM ID, BACTERIA Comment    Antimicrobial Susceptibility Comment   CBC with Differential/Platelet  Result Value Ref Range   WBC 12.4 (H) 3.4 - 10.8 x10E3/uL   RBC 4.34 3.77 - 5.28 x10E6/uL   Hemoglobin 14.1 11.1 - 15.9 g/dL   Hematocrit 16.139.7 09.634.0 - 46.6 %   MCV 92 79 - 97 fL   MCH 32.5 26.6 - 33.0 pg   MCHC 35.5 31.5 - 35.7 g/dL   RDW 04.512.4 40.911.7 - 81.115.4 %   Platelets 288 150 - 450 x10E3/uL   Neutrophils 59 Not Estab. %   Lymphs 29 Not Estab. %   Monocytes 8 Not Estab. %   Eos 2 Not Estab. %   Basos 1 Not Estab. %   Neutrophils Absolute 7.4 (H) 1.4 - 7.0 x10E3/uL   Lymphocytes Absolute 3.6 (H) 0.7 - 3.1 x10E3/uL   Monocytes Absolute 1.0 (H) 0.1 - 0.9 x10E3/uL   EOS (ABSOLUTE) 0.2 0.0 - 0.4 x10E3/uL   Basophils Absolute 0.1 0.0 - 0.2 x10E3/uL   Immature Granulocytes 1 Not Estab. %   Immature Grans (Abs) 0.1 0.0 - 0.1 x10E3/uL  Comprehensive metabolic panel  Result Value Ref Range   Glucose 74 65 - 99 mg/dL   BUN 6 6 - 24 mg/dL   Creatinine, Ser 9.140.73 0.57 - 1.00 mg/dL   GFR calc non Af Amer 102 >59 mL/min/1.73   GFR calc Af Amer 117 >59 mL/min/1.73   BUN/Creatinine Ratio 8 (L) 9 - 23   Sodium 138 134 - 144 mmol/L   Potassium 5.4 (H) 3.5 - 5.2 mmol/L   Chloride 96 96 - 106 mmol/L   CO2 21 20 - 29 mmol/L   Calcium 9.6 8.7 - 10.2 mg/dL   Total Protein 7.3 6.0 - 8.5 g/dL   Albumin 4.7 3.8 - 4.8 g/dL   Globulin, Total 2.6 1.5 - 4.5 g/dL   Albumin/Globulin Ratio 1.8 1.2 - 2.2   Bilirubin Total 0.3 0.0 - 1.2 mg/dL   Alkaline Phosphatase 94 39 - 117 IU/L   AST 57 (H) 0 - 40 IU/L  ALT 45 (H) 0 - 32 IU/L  Lipase  Result Value Ref Range   Lipase 21 14 - 72 U/L  TSH  Result  Value Ref Range   TSH 3.080 0.450 - 4.500 uIU/mL  UA/M w/rflx Culture, Routine   Specimen: Urine   URINE  Result Value Ref Range   Specific Gravity, UA 1.020 1.005 - 1.030   pH, UA 6.5 5.0 - 7.5   Color, UA Yellow Yellow   Appearance Ur Clear Clear   Leukocytes, UA Negative Negative   Protein, UA 1+ (A) Negative/Trace   Glucose, UA Negative Negative   Ketones, UA Negative Negative   RBC, UA Negative Negative   Bilirubin, UA Negative Negative   Urobilinogen, Ur 0.2 0.2 - 1.0 mg/dL   Nitrite, UA Negative Negative   Microscopic Examination See below:    Urinalysis Reflex Comment       Assessment & Plan:   Problem List Items Addressed This Visit      Cardiovascular and Mediastinum   Essential hypertension - Primary    BP still slightly above goal, but currently a bit sick and under a lot of stress. Declines increasing regimen just yet, wishes to recheck once she's feeling better. Continue current regimen and work on stress control, diet and exercise. Call with persistent abnormal home BPs        Respiratory   Asthma    Continue allergy and inhaler regimen. Unsure if exacerbation or acute illness causing current cough. Suggested she get tested regardless of daughter's result given worsening cough and exposures. Isolate at home. Tessalon and tussionex sent for symptomatic relief. Precautions reviewed      Relevant Medications   albuterol (VENTOLIN HFA) 108 (90 Base) MCG/ACT inhaler     Other   ADHD    Stable and under good control, continue current regimen          Follow up plan: Return in about 3 months (around 08/16/2019) for 6 month f/u.

## 2019-05-19 MED ORDER — AMPHETAMINE-DEXTROAMPHETAMINE 20 MG PO TABS: 20.0000 mg | ORAL_TABLET | Freq: Two times a day (BID) | ORAL | 0 refills | Status: DC

## 2019-05-19 NOTE — Telephone Encounter (Signed)
2 week rx sent.

## 2019-05-19 NOTE — Telephone Encounter (Signed)
Patient notified

## 2019-05-24 NOTE — Assessment & Plan Note (Signed)
Stable and under good control, continue current regimen 

## 2019-05-24 NOTE — Assessment & Plan Note (Signed)
Continue allergy and inhaler regimen. Unsure if exacerbation or acute illness causing current cough. Suggested she get tested regardless of daughter's result given worsening cough and exposures. Isolate at home. Tessalon and tussionex sent for symptomatic relief. Precautions reviewed

## 2019-05-24 NOTE — Assessment & Plan Note (Signed)
BP still slightly above goal, but currently a bit sick and under a lot of stress. Declines increasing regimen just yet, wishes to recheck once she's feeling better. Continue current regimen and work on stress control, diet and exercise. Call with persistent abnormal home BPs

## 2019-05-31 ENCOUNTER — Telehealth: Payer: Self-pay | Admitting: Family Medicine

## 2019-05-31 NOTE — Telephone Encounter (Signed)
Pt called stating she believes she has a yeast infection under her breasts and is requesting to have a medication sent in for her. Please advise.    [place], [place] - [address]  [place] [address]  Phone: [phone] Fax: [phone]  Not a 24 hour pharmacy; exact hours not known.

## 2019-05-31 NOTE — Telephone Encounter (Signed)
This is not high priority. Needs appt, new issue

## 2019-05-31 NOTE — Telephone Encounter (Signed)
Routing to provider  

## 2019-05-31 NOTE — Telephone Encounter (Signed)
Scheduled virtual for tomorrow  

## 2019-06-01 ENCOUNTER — Encounter: Payer: Self-pay | Admitting: Family Medicine

## 2019-06-01 ENCOUNTER — Other Ambulatory Visit: Payer: Self-pay

## 2019-06-01 ENCOUNTER — Ambulatory Visit (INDEPENDENT_AMBULATORY_CARE_PROVIDER_SITE_OTHER): Payer: Medicaid Other | Admitting: Family Medicine

## 2019-06-01 VITALS — BP 128/87 | HR 78 | Ht 59.0 in | Wt 156.0 lb

## 2019-06-01 DIAGNOSIS — R21 Rash and other nonspecific skin eruption: Secondary | ICD-10-CM | POA: Diagnosis not present

## 2019-06-01 MED ORDER — NYSTATIN 100000 UNIT/GM EX CREA: 1.0000 "application " | TOPICAL_CREAM | Freq: Two times a day (BID) | CUTANEOUS | 0 refills | Status: DC

## 2019-06-01 MED ORDER — FLUCONAZOLE 150 MG PO TABS: 150.0000 mg | ORAL_TABLET | Freq: Every day | ORAL | 0 refills | Status: DC

## 2019-06-01 NOTE — Progress Notes (Signed)
BP 128/87   Pulse 78   Ht 4\' 11"  (1.499 m)   Wt 156 lb (70.8 kg)   BMI 31.51 kg/m    Subjective:    Patient ID: Laurie JunesMelissa G Terada, female    DOB: 05/08/1976, 43 y.o.   MRN: [id number]  HPI: Laurie Roman is a 43 y.o. female  Chief Complaint  Patient presents with  . Follow-up    . This visit was completed via WebEx due to the restrictions of the COVID-19 pandemic. All issues as above were discussed and addressed. Physical exam was done as above through visual confirmation on WebEx. If it was felt that the patient should be evaluated in the office, they were directed there. The patient verbally consented to this visit. . Location of the patient: home . Location of the provider: work . Those involved with this call:  . Provider: Roosvelt Maserachel Lariza Cothron, PA-C . CMA: Elton SinAnita Quito, CMA . Front Desk/Registration: Harriet PhoJoliza Johnson  . Time spent on call: 15 minutes with patient face to face via video conference. More than 50% of this time was spent in counseling and coordination of care. 5 minutes total spent in review of patient's record and preparation of their chart. I verified patient identity using two factors (patient name and date of birth). Patient consents verbally to being seen via telemedicine visit today.   Itchy, painful rash under breasts the past 2 days. Hx of yeast rashes and it feels similar to her. Trying monistat cream without relief. Denies new soaps or detergents, new medications or foods, insect bites, wounds.   Relevant past medical, surgical, family and social history reviewed and updated as indicated. Interim medical history since our last visit reviewed. Allergies and medications reviewed and updated.  Review of Systems  Per HPI unless specifically indicated above     Objective:    BP 128/87   Pulse 78   Ht 4\' 11"  (1.499 m)   Wt 156 lb (70.8 kg)   BMI 31.51 kg/m   Wt Readings from Last 3 Encounters:  06/01/19 156 lb (70.8 kg)  05/18/19 157 lb (71.2 kg)  01/04/19  173 lb (78.5 kg)    Physical Exam Vitals and nursing note reviewed.  Constitutional:      General: She is not in acute distress.    Appearance: Normal appearance.  HENT:     Head: Atraumatic.     Right Ear: External ear normal.     Left Ear: External ear normal.     Nose: Nose normal. No congestion.     Mouth/Throat:     Mouth: Mucous membranes are moist.     Pharynx: Oropharynx is clear. No posterior oropharyngeal erythema.  Eyes:     Extraocular Movements: Extraocular movements intact.     Conjunctiva/sclera: Conjunctivae normal.  Cardiovascular:     Comments: Unable to assess via virtual visit Pulmonary:     Effort: Pulmonary effort is normal. No respiratory distress.  Musculoskeletal:        General: Normal range of motion.     Cervical back: Normal range of motion.  Skin:    General: Skin is dry.     Findings: Rash (erythematous, macerated rash under both breasts) present. No erythema.  Neurological:     Mental Status: She is alert and oriented to person, place, and time.  Psychiatric:        Mood and Affect: Mood normal.        Thought Content: Thought content normal.  Judgment: Judgment normal.     Results for orders placed or performed in visit on 08/11/18  Ova and parasite examination   Specimen: Stool   ST  Result Value Ref Range   OVA + PARASITE EXAM Final report    O&P result 1 Comment   Fecal leukocytes   Specimen: Stool   ST  Result Value Ref Range   White Blood Cells (WBC), Stool Final report None Seen   Result 1 Comment   Stool C-Diff Toxin Assay   Specimen: Stool   ST  Result Value Ref Range   C difficile Toxins A+B, EIA Negative Negative  Microscopic Examination   URINE  Result Value Ref Range   WBC, UA 0-5 0 - 5 /hpf   RBC, UA None seen 0 - 2 /hpf   Epithelial Cells (non renal) 0-10 0 - 10 /hpf   Casts Present None seen /lpf   Cast Type Hyaline casts N/A   Bacteria, UA Many (A) None seen/Few  Urine Culture, Reflex   URINE    Result Value Ref Range   Urine Culture, Routine Final report (A)    Organism ID, Bacteria Enterococcus faecalis (A)    ORGANISM ID, BACTERIA Comment    Antimicrobial Susceptibility Comment   CBC with Differential/Platelet  Result Value Ref Range   WBC 12.4 (H) 3.4 - 10.8 x10E3/uL   RBC 4.34 3.77 - 5.28 x10E6/uL   Hemoglobin 14.1 11.1 - 15.9 g/dL   Hematocrit 39.7 34.0 - 46.6 %   MCV 92 79 - 97 fL   MCH 32.5 26.6 - 33.0 pg   MCHC 35.5 31.5 - 35.7 g/dL   RDW 12.4 11.7 - 15.4 %   Platelets 288 150 - 450 x10E3/uL   Neutrophils 59 Not Estab. %   Lymphs 29 Not Estab. %   Monocytes 8 Not Estab. %   Eos 2 Not Estab. %   Basos 1 Not Estab. %   Neutrophils Absolute 7.4 (H) 1.4 - 7.0 x10E3/uL   Lymphocytes Absolute 3.6 (H) 0.7 - 3.1 x10E3/uL   Monocytes Absolute 1.0 (H) 0.1 - 0.9 x10E3/uL   EOS (ABSOLUTE) 0.2 0.0 - 0.4 x10E3/uL   Basophils Absolute 0.1 0.0 - 0.2 x10E3/uL   Immature Granulocytes 1 Not Estab. %   Immature Grans (Abs) 0.1 0.0 - 0.1 x10E3/uL  Comprehensive metabolic panel  Result Value Ref Range   Glucose 74 65 - 99 mg/dL   BUN 6 6 - 24 mg/dL   Creatinine, Ser 0.73 0.57 - 1.00 mg/dL   GFR calc non Af Amer 102 >59 mL/min/1.73   GFR calc Af Amer 117 >59 mL/min/1.73   BUN/Creatinine Ratio 8 (L) 9 - 23   Sodium 138 134 - 144 mmol/L   Potassium 5.4 (H) 3.5 - 5.2 mmol/L   Chloride 96 96 - 106 mmol/L   CO2 21 20 - 29 mmol/L   Calcium 9.6 8.7 - 10.2 mg/dL   Total Protein 7.3 6.0 - 8.5 g/dL   Albumin 4.7 3.8 - 4.8 g/dL   Globulin, Total 2.6 1.5 - 4.5 g/dL   Albumin/Globulin Ratio 1.8 1.2 - 2.2   Bilirubin Total 0.3 0.0 - 1.2 mg/dL   Alkaline Phosphatase 94 39 - 117 IU/L   AST 57 (H) 0 - 40 IU/L   ALT 45 (H) 0 - 32 IU/L  Lipase  Result Value Ref Range   Lipase 21 14 - 72 U/L  TSH  Result Value Ref Range   TSH 3.080 0.450 -  4.500 uIU/mL  UA/M w/rflx Culture, Routine   Specimen: Urine   URINE  Result Value Ref Range   Specific Gravity, UA 1.020 1.005 - 1.030   pH,  UA 6.5 5.0 - 7.5   Color, UA Yellow Yellow   Appearance Ur Clear Clear   Leukocytes, UA Negative Negative   Protein, UA 1+ (A) Negative/Trace   Glucose, UA Negative Negative   Ketones, UA Negative Negative   RBC, UA Negative Negative   Bilirubin, UA Negative Negative   Urobilinogen, Ur 0.2 0.2 - 1.0 mg/dL   Nitrite, UA Negative Negative   Microscopic Examination See below:    Urinalysis Reflex Comment       Assessment & Plan:   Problem List Items Addressed This Visit    None    Visit Diagnoses    Rash    -  Primary   Consistent with candida rash. Tx with diflucan and nystatin cream. Discussed using powders to keep area dry and clean       Follow up plan: Return if symptoms worsen or fail to improve.

## 2019-06-07 ENCOUNTER — Telehealth: Payer: Self-pay | Admitting: Family Medicine

## 2019-06-07 NOTE — Telephone Encounter (Signed)
Called to make 3 month f/u, Pt  Did not want to make appt stated she would call back to schedule.

## 2019-06-12 ENCOUNTER — Telehealth: Payer: Self-pay

## 2019-06-12 NOTE — Telephone Encounter (Signed)
Open in error

## 2019-06-16 ENCOUNTER — Ambulatory Visit (INDEPENDENT_AMBULATORY_CARE_PROVIDER_SITE_OTHER): Payer: Medicaid Other | Admitting: Family Medicine

## 2019-06-16 ENCOUNTER — Encounter: Payer: Self-pay | Admitting: Family Medicine

## 2019-06-16 ENCOUNTER — Other Ambulatory Visit: Payer: Self-pay

## 2019-06-16 VITALS — Ht 59.0 in | Wt 153.0 lb

## 2019-06-16 DIAGNOSIS — F4321 Adjustment disorder with depressed mood: Secondary | ICD-10-CM

## 2019-06-16 DIAGNOSIS — F419 Anxiety disorder, unspecified: Secondary | ICD-10-CM

## 2019-06-16 MED ORDER — DIAZEPAM 5 MG PO TABS: 5.0000 mg | ORAL_TABLET | Freq: Three times a day (TID) | ORAL | 0 refills | Status: DC | PRN

## 2019-06-16 MED ORDER — QUETIAPINE FUMARATE 50 MG PO TABS: 50.0000 mg | ORAL_TABLET | Freq: Every day | ORAL | 0 refills | Status: DC

## 2019-06-16 NOTE — Progress Notes (Signed)
Ht 4\' 11"  (1.499 m)   Wt 153 lb (69.4 kg)   BMI 30.90 kg/m    Subjective:    Patient ID: , female    DOB: [birth year], 44 y.o.   MRN: [id number]  HPI: Laurie Roman is a 44 y.o. female  Chief Complaint  Patient presents with  . Anxiety    . This visit was completed via WebEx due to the restrictions of the COVID-19 pandemic. All issues as above were discussed and addressed. Physical exam was done as above through visual confirmation on WebEx. If it was felt that the patient should be evaluated in the office, they were directed there. The patient verbally consented to this visit. . Location of the patient: home . Location of the provider: home . Those involved with this call:  . Provider: 55, PA-C . CMA: Roosvelt Maser, CMA . Front Desk/Registration: Elton Sin  . Time spent on call: 30 minutes with patient face to face via video conference. More than 50% of this time was spent in counseling and coordination of care. 5 minutes total spent in review of patient's record and preparation of their chart. I verified patient identity using two factors (patient name and date of birth). Patient consents verbally to being seen via telemedicine visit today.   Patient presenting today with severe exacerbation of anxiety and grief after sudden loss of husband in MVA 06/05/2019. Still trying to process everything, and trying hard to be strong for her children which has been so hard for her. Trying to save tears for when she's alone. Finds herself having panic attacks, tension headaches, agitation, insomnia. Has not had any sort of grief counseling since incident and is hesitant to do so.  Denies SI/HI. Has not done well on xanax or SSRIs in the past.    GAD 7 : Generalized Anxiety Score 06/16/2019  Nervous, Anxious, on Edge 3  Control/stop worrying 3  Worry too much - different things 3  Trouble relaxing 3  Restless 3  Easily annoyed or irritable 3  Afraid - awful might  happen 3  Total GAD 7 Score 21  Anxiety Difficulty Extremely difficult   Depression screen PHQ 2/9 06/16/2019  Decreased Interest 3  Down, Depressed, Hopeless 3  PHQ - 2 Score 6  Altered sleeping 3  Tired, decreased energy 3  Change in appetite 3  Feeling bad or failure about yourself  3  Trouble concentrating 3  Moving slowly or fidgety/restless 3  Suicidal thoughts 0  PHQ-9 Score 24  Difficult doing work/chores Extremely dIfficult    Relevant past medical, surgical, family and social history reviewed and updated as indicated. Interim medical history since our last visit reviewed. Allergies and medications reviewed and updated.  Review of Systems  Per HPI unless specifically indicated above     Objective:    Ht 4\' 11"  (1.499 m)   Wt 153 lb (69.4 kg)   BMI 30.90 kg/m   Wt Readings from Last 3 Encounters:  06/16/19 153 lb (69.4 kg)  06/01/19 156 lb (70.8 kg)  05/18/19 157 lb (71.2 kg)    Physical Exam Vitals and nursing note reviewed.  Constitutional:      General: She is not in acute distress.    Appearance: Normal appearance.  HENT:     Head: Atraumatic.     Right Ear: External ear normal.     Left Ear: External ear normal.     Nose: Nose normal. No congestion.  Mouth/Throat:     Mouth: Mucous membranes are moist.     Pharynx: Oropharynx is clear. No posterior oropharyngeal erythema.  Eyes:     Extraocular Movements: Extraocular movements intact.     Conjunctiva/sclera: Conjunctivae normal.  Cardiovascular:     Comments: Unable to assess via virtual visit Pulmonary:     Effort: Pulmonary effort is normal. No respiratory distress.  Musculoskeletal:        General: Normal range of motion.     Cervical back: Normal range of motion.  Skin:    General: Skin is dry.     Findings: No erythema.  Neurological:     Mental Status: She is alert and oriented to person, place, and time.  Psychiatric:        Thought Content: Thought content normal.         Judgment: Judgment normal.     Comments: tearful     Results for orders placed or performed in visit on 08/11/18  Ova and parasite examination   Specimen: Stool   ST  Result Value Ref Range   OVA + PARASITE EXAM Final report    O&P result 1 Comment   Fecal leukocytes   Specimen: Stool   ST  Result Value Ref Range   White Blood Cells (WBC), Stool Final report None Seen   Result 1 Comment   Stool C-Diff Toxin Assay   Specimen: Stool   ST  Result Value Ref Range   C difficile Toxins A+B, EIA Negative Negative  Microscopic Examination   URINE  Result Value Ref Range   WBC, UA 0-5 0 - 5 /hpf   RBC, UA None seen 0 - 2 /hpf   Epithelial Cells (non renal) 0-10 0 - 10 /hpf   Casts Present None seen /lpf   Cast Type Hyaline casts N/A   Bacteria, UA Many (A) None seen/Few  Urine Culture, Reflex   URINE  Result Value Ref Range   Urine Culture, Routine Final report (A)    Organism ID, Bacteria Enterococcus faecalis (A)    ORGANISM ID, BACTERIA Comment    Antimicrobial Susceptibility Comment   CBC with Differential/Platelet  Result Value Ref Range   WBC 12.4 (H) 3.4 - 10.8 x10E3/uL   RBC 4.34 3.77 - 5.28 x10E6/uL   Hemoglobin 14.1 11.1 - 15.9 g/dL   Hematocrit 24.4 01.0 - 46.6 %   MCV 92 79 - 97 fL   MCH 32.5 26.6 - 33.0 pg   MCHC 35.5 31.5 - 35.7 g/dL   RDW 27.2 53.6 - 64.4 %   Platelets 288 150 - 450 x10E3/uL   Neutrophils 59 Not Estab. %   Lymphs 29 Not Estab. %   Monocytes 8 Not Estab. %   Eos 2 Not Estab. %   Basos 1 Not Estab. %   Neutrophils Absolute 7.4 (H) 1.4 - 7.0 x10E3/uL   Lymphocytes Absolute 3.6 (H) 0.7 - 3.1 x10E3/uL   Monocytes Absolute 1.0 (H) 0.1 - 0.9 x10E3/uL   EOS (ABSOLUTE) 0.2 0.0 - 0.4 x10E3/uL   Basophils Absolute 0.1 0.0 - 0.2 x10E3/uL   Immature Granulocytes 1 Not Estab. %   Immature Grans (Abs) 0.1 0.0 - 0.1 x10E3/uL  Comprehensive metabolic panel  Result Value Ref Range   Glucose 74 65 - 99 mg/dL   BUN 6 6 - 24 mg/dL   Creatinine, Ser  0.34 0.57 - 1.00 mg/dL   GFR calc non Af Amer 102 >59 mL/min/1.73   GFR calc Af [name]  117 >59 mL/min/1.73   BUN/Creatinine Ratio 8 (L) 9 - 23   Sodium 138 134 - 144 mmol/L   Potassium 5.4 (H) 3.5 - 5.2 mmol/L   Chloride 96 96 - 106 mmol/L   CO2 21 20 - 29 mmol/L   Calcium 9.6 8.7 - 10.2 mg/dL   Total Protein 7.3 6.0 - 8.5 g/dL   Albumin 4.7 3.8 - 4.8 g/dL   Globulin, Total 2.6 1.5 - 4.5 g/dL   Albumin/Globulin Ratio 1.8 1.2 - 2.2   Bilirubin Total 0.3 0.0 - 1.2 mg/dL   Alkaline Phosphatase 94 39 - 117 IU/L   AST 57 (H) 0 - 40 IU/L   ALT 45 (H) 0 - 32 IU/L  Lipase  Result Value Ref Range   Lipase 21 14 - 72 U/L  TSH  Result Value Ref Range   TSH 3.080 0.450 - 4.500 uIU/mL  UA/M w/rflx Culture, Routine   Specimen: Urine   URINE  Result Value Ref Range   Specific Gravity, UA 1.020 1.005 - 1.030   pH, UA 6.5 5.0 - 7.5   Color, UA Yellow Yellow   Appearance Ur Clear Clear   Leukocytes, UA Negative Negative   Protein, UA 1+ (A) Negative/Trace   Glucose, UA Negative Negative   Ketones, UA Negative Negative   RBC, UA Negative Negative   Bilirubin, UA Negative Negative   Urobilinogen, Ur 0.2 0.2 - 1.0 mg/dL   Nitrite, UA Negative Negative   Microscopic Examination See below:    Urinalysis Reflex Comment       Assessment & Plan:   Problem List Items Addressed This Visit      Other   Anxiety    Exacerbated by grief, will trial prn valium and seroquel at bedtime. Referral to Social Work placed to help with counseling needs      Relevant Medications   diazepam (VALIUM) 5 MG tablet    Other Visit Diagnoses    Grief    -  Primary   valium prn, start seroquel at bedtime. Social Work referral placed for [employer].    Relevant Orders   [facility]      30 minutes today spent in direct patient care and counseling .  Follow up plan: Return in about 4 weeks (around 07/14/2019) for Anxiety f/u.

## 2019-06-21 DIAGNOSIS — F419 Anxiety disorder, unspecified: Secondary | ICD-10-CM | POA: Insufficient documentation

## 2019-06-21 NOTE — Assessment & Plan Note (Signed)
Exacerbated by grief, will trial prn valium and seroquel at bedtime. Referral to Social Work placed to help with counseling needs

## 2019-07-25 ENCOUNTER — Telehealth: Payer: Self-pay | Admitting: Family Medicine

## 2019-07-25 NOTE — Telephone Encounter (Signed)
Called pt to see if she could do virtual appt for tomorrow, pt states that she started a new job and doesn't know what her schedule is for tomorrow. She states that she will call back to r/s wanted me to let provider know.

## 2019-07-26 ENCOUNTER — Ambulatory Visit: Payer: Medicaid Other | Admitting: Family Medicine

## 2019-07-28 ENCOUNTER — Ambulatory Visit: Payer: Self-pay | Admitting: Licensed Clinical Social Worker

## 2019-07-31 NOTE — Chronic Care Management (AMB) (Signed)
  Care Management   Follow Up Note   07/31/2019 Name: Laurie Roman MRN: [id number] DOB: [birth year]  Referred by: Particia Nearing, PA-C Reason for referral : Care Coordination   Laurie Roman is a 44 y.o. year old female who is a primary care patient of Particia Nearing, [place]. The care management team was consulted for assistance with care management and care coordination needs.    Review of patient status, including review of consultants reports, relevant laboratory and other test results, and collaboration with appropriate care team members and the patient's provider was performed as part of comprehensive patient evaluation and provision of chronic care management services.   LCSW completed outreach call to patient and was able to successfully reach her. HIPPA verifications received. Patient denied wanting CCM social work services at this time. Patient does not want to gain mental health support or resources. Patient shares that she is currently on medication to treat her mental health that was prescribed by her PCP. LCSW will close referral at this time. Patient was encouraged to contact CCM LCSW in case she changes her mind.  The care management team is available to follow up with the patient after provider conversation with the patient regarding recommendation for care management engagement and subsequent re-referral to the care management team.   [name], BSW, MSW, LCSW [facility]  [facility] [email or web address] Phone: [phone]

## 2019-08-21 ENCOUNTER — Ambulatory Visit: Payer: Medicaid Other | Admitting: Family Medicine

## 2019-09-02 ENCOUNTER — Other Ambulatory Visit: Payer: Self-pay | Admitting: Family Medicine

## 2019-09-02 NOTE — Telephone Encounter (Signed)
Requested medication (s) are due for refill today: yes  Requested medication (s) are on the active medication list: yes- but expired on 08/15/19  Last refill:  05/19/19  Future visit scheduled: no  Notes to clinic:  Medication not delegated to NT to refill   Requested Prescriptions  Pending Prescriptions Disp Refills   amphetamine-dextroamphetamine (ADDERALL) 20 MG tablet [Pharmacy Med Name: DEXTROAMP-AMPHETAMIN 20 MG TAB] 30 tablet 0    Sig: Take 1 tablet (20 mg total) by mouth 2 (two) times daily.      Not Delegated - Psychiatry:  Stimulants/ADHD Failed - 09/02/2019 10:41 AM      Failed - This refill cannot be delegated      Failed - Urine Drug Screen completed in last 360 days.      Failed - Valid encounter within last 3 months    Recent Outpatient Visits           2 months ago Grief   [facility] Maser [place], [place]   3 months ago Rash   [facility] M[facility] [place], [place]   3 months ago Essential hypertension   [facility] Maser [place], [place]   5 months ago Bacterial conjunctivitis of right eye   [facility] Ro[facility] Maser [place], [place]   8 months ago Essential hypertension   [facility] Maser [place], [place]

## 2019-09-04 NOTE — Telephone Encounter (Signed)
Patient last seen 12/21 and 06/16/19.

## 2019-09-08 ENCOUNTER — Telehealth: Payer: Medicaid Other | Admitting: Family Medicine

## 2019-09-15 ENCOUNTER — Encounter: Payer: Self-pay | Admitting: Nurse Practitioner

## 2019-09-15 ENCOUNTER — Telehealth: Payer: Self-pay | Admitting: Family Medicine

## 2019-09-15 ENCOUNTER — Other Ambulatory Visit: Payer: Self-pay

## 2019-09-15 ENCOUNTER — Other Ambulatory Visit: Payer: Self-pay | Admitting: Nurse Practitioner

## 2019-09-15 ENCOUNTER — Ambulatory Visit (INDEPENDENT_AMBULATORY_CARE_PROVIDER_SITE_OTHER): Payer: Medicaid Other | Admitting: Nurse Practitioner

## 2019-09-15 ENCOUNTER — Ambulatory Visit: Payer: Medicaid Other | Admitting: Nurse Practitioner

## 2019-09-15 VITALS — Ht 59.0 in | Wt 153.0 lb

## 2019-09-15 DIAGNOSIS — F902 Attention-deficit hyperactivity disorder, combined type: Secondary | ICD-10-CM

## 2019-09-15 DIAGNOSIS — B002 Herpesviral gingivostomatitis and pharyngotonsillitis: Secondary | ICD-10-CM | POA: Diagnosis not present

## 2019-09-15 DIAGNOSIS — R059 Cough, unspecified: Secondary | ICD-10-CM

## 2019-09-15 DIAGNOSIS — F909 Attention-deficit hyperactivity disorder, unspecified type: Secondary | ICD-10-CM | POA: Diagnosis not present

## 2019-09-15 DIAGNOSIS — K219 Gastro-esophageal reflux disease without esophagitis: Secondary | ICD-10-CM

## 2019-09-15 DIAGNOSIS — F5101 Primary insomnia: Secondary | ICD-10-CM | POA: Insufficient documentation

## 2019-09-15 DIAGNOSIS — R05 Cough: Secondary | ICD-10-CM

## 2019-09-15 MED ORDER — AMPHETAMINE-DEXTROAMPHETAMINE 20 MG PO TABS: 20.0000 mg | ORAL_TABLET | Freq: Two times a day (BID) | ORAL | 0 refills | Status: DC

## 2019-09-15 MED ORDER — PANTOPRAZOLE SODIUM 40 MG PO TBEC: 40.0000 mg | DELAYED_RELEASE_TABLET | Freq: Two times a day (BID) | ORAL | 1 refills | Status: DC

## 2019-09-15 MED ORDER — TRAZODONE HCL 50 MG PO TABS: 25.0000 mg | ORAL_TABLET | Freq: Every evening | ORAL | 0 refills | Status: DC | PRN

## 2019-09-15 MED ORDER — VALACYCLOVIR HCL 1 G PO TABS: 2000.0000 mg | ORAL_TABLET | Freq: Two times a day (BID) | ORAL | 0 refills | Status: AC

## 2019-09-15 MED ORDER — GUAIFENESIN-CODEINE 100-10 MG/5ML PO SOLN: 5.0000 mL | Freq: Every evening | ORAL | 0 refills | Status: DC | PRN

## 2019-09-15 NOTE — Telephone Encounter (Signed)
Rxs sent.  I re-ordered what Fleet Contras had ordered in the past for the cough syrup so I am not sure what happened with that.

## 2019-09-15 NOTE — Assessment & Plan Note (Addendum)
Stable on current regimen.  Continue current regimen.  PDMP reviewed and refills given for 3 months.  Patient to return or call to clinic in meantime with concerns.

## 2019-09-15 NOTE — Telephone Encounter (Signed)
Copied from CRM [id number]. Topic: General - Other >> Sep 15, 2019  2:11 PM Jaquita Rector A wrote: Reason for CRM: Patient called to inquire of Shanda Bumps about the guaiFENesin-codeine 100-10 MG/5ML syrup. States that it was supposed to be 4 to 8 oz and also need a refill on her pantoprazole (PROTONIX) 40 MG tablet  she can be reached at Ph#   [phone]

## 2019-09-15 NOTE — Patient Instructions (Signed)
Cognitive Behavioral Therapy Cognitive behavioral therapy (CBT) is a short-term, goal-oriented type of talk therapy. CBT can help you:  Identify patterns of thinking, feeling, and behaving that are causing you problems.  Decide how you want to think, feel, and respond to life events.  Set goals to change the beliefs and thoughts that cause you to act in ways that are not helpful for you.  Follow up on the changes that you make. What are the different types of CBT? The different types of CBT include:  Dialectical behavioral therapy (DBT). This approach is often used in group therapy, and it aids a person in managing behavior by focusing on: ? Things that cause problems to start (triggers). ? Methods of self-calming. ? Re-evaluating thinking processes.  Mindfulness-based cognitive therapy. This approach involves focusing your attention, meditating, and developing awareness of the present moment (mindfulness).  Rational emotive behavior therapy. This approach uses rational thought to reframe your thinking so it is less judgmental. Your therapist may directly challenge your thought processes.  Stress inoculation training. This approach involves planning ahead for stressful situations by practicing new thoughts and behaviors. This planning can help you avoid going back to old actions.  Acceptance and commitment therapy (ACT). This approach focuses on accepting yourself as you are and practicing mindfulness. It helps you understand what you would like to change and how you can set goals in that direction. What conditions is CBT used to treat? CBT may help to treat:  Mental health conditions, including: ? Depression. ? Anxiety. ? Bipolar disorder. ? Eating disorders. ? Post-traumatic stress disorder (PTSD). ? Obsessive-compulsive disorder (OCD).  Insomnia and other sleep disorders.  Pain.  Stress.  Coping with loss or grief.  Coping with a difficult medical diagnosis or  illness.  Relationship problems.  Emotional distress or shock (trauma). How can CBT help me? CBT may:  Give you a chance to share your thoughts, feelings, problems, and fears in a safe space.  Help you focus on specific problems.  Give you homework that helps you put theory into practice. Homework may include keeping a journal or doing thinking exercises.  Help you become aware of your patterns of thinking, feeling, and behaving, and how those three patterns affect each other.  Change your thoughts so that you can change your behaviors.  Help you chose how you want to view the world.  Teach you planned coping skills and offer better ways to deal with stress and difficult situations. To make the most of CBT, make sure you:  Find a licensed therapist whom you trust.  Take an active part in your therapy and do the homework that you are given.  Are honest about your problems.  Avoid skipping your therapy sessions. Summary  Cognitive behavioral therapy (CBT) is a short-term, goal-oriented type of talk therapy.  CBT can help you become aware of your patterns and the relationships among your thoughts, feelings, and behavior.  CBT may help mental health conditions and other problems. This information is not intended to replace advice given to you by your health care provider. Make sure you discuss any questions you have with your health care provider. Document Revised: 02/15/2019 Document Reviewed: 10/06/2016 Elsevier Patient Education  2020 Elsevier Inc.  

## 2019-09-15 NOTE — Telephone Encounter (Signed)
Patient notified

## 2019-09-15 NOTE — Assessment & Plan Note (Signed)
Chronic, ongoing.  Will trial trazodone 25-50 mg at night for sleep.  Declines CBT for now but will reach out to [place] if she desires in the future.  Will f/u on insomnia in 4 weeks.

## 2019-09-15 NOTE — Progress Notes (Signed)
Ht 4\' 11"  (1.499 m)   Wt 153 lb (69.4 kg)   BMI 30.90 kg/m    Subjective:    Patient ID: Laurie Roman, female    DOB: 01-28-1976, 44 y.o.   MRN: [id number]  HPI: Laurie Roman is a 44 y.o. female presenting with multiple concerns.  Chief Complaint  Patient presents with  . Mouth Lesions    Ongoing 1 day.  . Insomnia    Patient having difficulty sleeping.   . ADHD    Would like refill on Adderrall.   Patient reports "cold sores" for the past day.  She reports she has had them in the past and stress triggers them to recur.  ADHD Reports doing well on Adderall 20 mg 1-2 per day. ADHD status: controlled Satisfied with current therapy: yes Medication compliance:  excellent compliance Controlled substance contract: yes Previous psychiatry evaluation: no Previous medications: yes adderall   Taking meds on weekends/vacations: no Work/school performance:  excellent Difficulty sustaining attention/completing tasks: no ADHD Medication Side Effects: no    Decreased appetite: no    Headache: no    Sleeping disturbance pattern: yes; does not think this is due to medication    Irritability: no    Rebound effects (worse than baseline) off medication: no    Anxiousness: no    Dizziness: no    Tics: no  INSOMNIA Reports she is not taking the Valium and she does not like how the Seroquel makes her feel groggy the next day.    Duration: chronic Satisfied with sleep quality: no Difficulty falling asleep: yes Difficulty staying asleep: yes Waking a few hours after sleep onset: yes Early morning awakenings: no Daytime hypersomnolence: no Wakes feeling refreshed: no Good sleep hygiene: no Apnea: unknown Snoring: unknown Depressed/anxious mood: yes  Nightmares: yes Recent stress: yes Restless legs/nocturnal leg cramps: no Chronic pain/arthritis: no History of sleep study: no Treatments attempted: Seroquel   Patient also reports a chronic cough that wakes her up at night.   She states she gets the medication refilled a couple of times per year at [facility] because the other pharmacy will not take her coupon.  Allergies  Allergen Reactions  . Cefazolin Hives  . Ondansetron Hcl Hives  . Emgality [Galcanezumab-Gnlm]   . Ultram [Tramadol] Other (See Comments)    Tachycardia  . Sulfa Antibiotics Rash  . Sulfasalazine Rash   Outpatient Encounter Medications as of 09/15/2019  Medication Sig  . albuterol (VENTOLIN HFA) 108 (90 Base) MCG/ACT inhaler Inhale 2 puffs into the lungs every 6 (six) hours as needed for wheezing or shortness of breath.  Marland Kitchen amLODipine (NORVASC) 5 MG tablet Take 1 tablet (5 mg total) by mouth daily.  Marland Kitchen amphetamine-dextroamphetamine (ADDERALL) 20 MG tablet Take 1 tablet (20 mg total) by mouth 2 (two) times daily.  . benazepril (LOTENSIN) 40 MG tablet Take 1 tablet (40 mg total) by mouth daily.  Eduard Roux (AIMOVIG) 70 MG/ML SOAJ Inject 70 mg into the skin every 30 (thirty) days.  . fluticasone (FLONASE) 50 MCG/ACT nasal spray Place into the nose.  . gabapentin (NEURONTIN) 600 MG tablet Take 1 tablet (600 mg total) by mouth 3 (three) times daily as needed.  . metoprolol succinate (TOPROL-XL) 100 MG 24 hr tablet Take 1 tablet (100 mg total) by mouth daily. Take with or immediately following a meal.  . pantoprazole (PROTONIX) 40 MG tablet Take 1 tablet (40 mg total) by mouth 2 (two) times daily before a meal.  . promethazine (  PHENERGAN) 12.5 MG tablet TAKE (1) TABLET BY MOUTH EVERY 6 HOURS AS NEEDED FOR NAUSEA  . rizatriptan (MAXALT) 10 MG tablet Take 1 tablet (10 mg total) by mouth as needed for migraine. May repeat in 2 hours if needed  . [DISCONTINUED] amphetamine-dextroamphetamine (ADDERALL) 20 MG tablet Take 1 tablet (20 mg total) by mouth 2 (two) times daily.  Melene Muller ON 10/16/2019] amphetamine-dextroamphetamine (ADDERALL) 20 MG tablet Take 1 tablet (20 mg total) by mouth 2 (two) times daily.  Melene Muller ON 11/16/2019]  amphetamine-dextroamphetamine (ADDERALL) 20 MG tablet Take 1 tablet (20 mg total) by mouth 2 (two) times daily.  Marland Kitchen guaiFENesin-codeine 100-10 MG/5ML syrup Take 5 mLs by mouth at bedtime as needed for cough.  . traZODone (DESYREL) 50 MG tablet Take 0.5-1 tablets (25-50 mg total) by mouth at bedtime as needed for sleep.  . valACYclovir (VALTREX) 1000 MG tablet Take 2 tablets (2,000 mg total) by mouth 2 (two) times daily for 1 day.  . [DISCONTINUED] amphetamine-dextroamphetamine (ADDERALL) 20 MG tablet Take 1 tablet (20 mg total) by mouth 2 (two) times daily. (Patient not taking: Reported on 06/16/2019)  . [DISCONTINUED] amphetamine-dextroamphetamine (ADDERALL) 20 MG tablet Take 1 tablet (20 mg total) by mouth 2 (two) times daily. (Patient not taking: Reported on 09/15/2019)  . [DISCONTINUED] amphetamine-dextroamphetamine (ADDERALL) 20 MG tablet Take 1 tablet (20 mg total) by mouth 2 (two) times daily. (Patient not taking: Reported on 06/01/2019)  . [DISCONTINUED] budesonide-formoterol (SYMBICORT) 160-4.5 MCG/ACT inhaler Inhale 2 puffs into the lungs 2 (two) times daily. (Patient not taking: Reported on 09/15/2019)  . [DISCONTINUED] diazepam (VALIUM) 5 MG tablet Take 1 tablet (5 mg total) by mouth every 8 (eight) hours as needed for anxiety. (Patient not taking: Reported on 09/15/2019)  . [DISCONTINUED] QUEtiapine (SEROQUEL) 50 MG tablet Take 1 tablet (50 mg total) by mouth at bedtime. (Patient not taking: Reported on 09/15/2019)  . [DISCONTINUED] umeclidinium-vilanterol (ANORO ELLIPTA) 62.5-25 MCG/INH AEPB Inhale 1 puff into the lungs daily. (Patient not taking: Reported on 09/15/2019)   No facility-administered encounter medications on file as of 09/15/2019.   Patient Active Problem List   Diagnosis Date Noted  . Primary insomnia 09/15/2019  . Anxiety   . Maxillary sinusitis 12/02/2018  . Asthma 03/27/2018  . Controlled substance agreement signed 10/13/2017  . Migraine 08/21/2017  . ADHD 08/21/2017  .  Essential hypertension 08/21/2017  . Mild obstructive sleep apnea 02/18/2017  . Left sided sciatica 09/27/2015  . Gastroesophageal reflux disease without esophagitis 05/02/2014   Past Medical History:  Diagnosis Date  . Anxiety   . Asthma   . Blood transfusion without reported diagnosis   . Depression   . GERD (gastroesophageal reflux disease)   . IBS (irritable bowel syndrome)   . Insomnia   . Lumbago   . Migraine   . Sciatica   . Sleep apnea   . Tietze's disease    Relevant past medical, surgical, family and social history reviewed and updated as indicated. Interim medical history since our last visit reviewed.  Review of Systems  Constitutional: Negative.  Negative for activity change, appetite change, fatigue and fever.  Eyes: Negative.  Negative for visual disturbance.  Respiratory: Positive for cough. Negative for chest tightness, shortness of breath and wheezing.   Cardiovascular: Negative.  Negative for chest pain.  Gastrointestinal: Negative.   Musculoskeletal: Negative.   Neurological: Negative.  Negative for dizziness, numbness and headaches.  Psychiatric/Behavioral: Positive for sleep disturbance. Negative for confusion and decreased concentration. The patient is nervous/anxious.  Per HPI unless specifically indicated above     Objective:    Ht 4\' 11"  (1.499 m)   Wt 153 lb (69.4 kg)   BMI 30.90 kg/m   Wt Readings from Last 3 Encounters:  09/15/19 153 lb (69.4 kg)  06/16/19 153 lb (69.4 kg)  06/01/19 156 lb (70.8 kg)    Physical Exam  Physical examination unable to be performed due to lack of equipment.  Results for orders placed or performed in visit on 08/11/18  Ova and parasite examination   Specimen: Stool   ST  Result Value Ref Range   OVA + PARASITE EXAM Final report    O&P result 1 Comment   Fecal leukocytes   Specimen: Stool   ST  Result Value Ref Range   White Blood Cells (WBC), Stool Final report None Seen   Result 1 Comment     Stool C-Diff Toxin Assay   Specimen: Stool   ST  Result Value Ref Range   C difficile Toxins A+B, EIA Negative Negative  Microscopic Examination   URINE  Result Value Ref Range   WBC, UA 0-5 0 - 5 /hpf   RBC, UA None seen 0 - 2 /hpf   Epithelial Cells (non renal) 0-10 0 - 10 /hpf   Casts Present None seen /lpf   Cast Type Hyaline casts N/A   Bacteria, UA Many (A) None seen/Few  Urine Culture, Reflex   URINE  Result Value Ref Range   Urine Culture, Routine Final report (A)    Organism ID, Bacteria Enterococcus faecalis (A)    ORGANISM ID, BACTERIA Comment    Antimicrobial Susceptibility Comment   CBC with Differential/Platelet  Result Value Ref Range   WBC 12.4 (H) 3.4 - 10.8 x10E3/uL   RBC 4.34 3.77 - 5.28 x10E6/uL   Hemoglobin 14.1 11.1 - 15.9 g/dL   Hematocrit 10/11/18 16.9 - 46.6 %   MCV 92 79 - 97 fL   MCH 32.5 26.6 - 33.0 pg   MCHC 35.5 31.5 - 35.7 g/dL   RDW 67.8 93.8 - 10.1 %   Platelets 288 150 - 450 x10E3/uL   Neutrophils 59 Not Estab. %   Lymphs 29 Not Estab. %   Monocytes 8 Not Estab. %   Eos 2 Not Estab. %   Basos 1 Not Estab. %   Neutrophils Absolute 7.4 (H) 1.4 - 7.0 x10E3/uL   Lymphocytes Absolute 3.6 (H) 0.7 - 3.1 x10E3/uL   Monocytes Absolute 1.0 (H) 0.1 - 0.9 x10E3/uL   EOS (ABSOLUTE) 0.2 0.0 - 0.4 x10E3/uL   Basophils Absolute 0.1 0.0 - 0.2 x10E3/uL   Immature Granulocytes 1 Not Estab. %   Immature Grans (Abs) 0.1 0.0 - 0.1 x10E3/uL  Comprehensive metabolic panel  Result Value Ref Range   Glucose 74 65 - 99 mg/dL   BUN 6 6 - 24 mg/dL   Creatinine, Ser 75.1 0.57 - 1.00 mg/dL   GFR calc non Af Amer 102 >59 mL/min/1.73   GFR calc Af Amer 117 >59 mL/min/1.73   BUN/Creatinine Ratio 8 (L) 9 - 23   Sodium 138 134 - 144 mmol/L   Potassium 5.4 (H) 3.5 - 5.2 mmol/L   Chloride 96 96 - 106 mmol/L   CO2 21 20 - 29 mmol/L   Calcium 9.6 8.7 - 10.2 mg/dL   Total Protein 7.3 6.0 - 8.5 g/dL   Albumin 4.7 3.8 - 4.8 g/dL   Globulin, Total 2.6 1.5 - 4.5 g/dL    Albumin/Globulin  Ratio 1.8 1.2 - 2.2   Bilirubin Total 0.3 0.0 - 1.2 mg/dL   Alkaline Phosphatase 94 39 - 117 IU/L   AST 57 (H) 0 - 40 IU/L   ALT 45 (H) 0 - 32 IU/L  Lipase  Result Value Ref Range   Lipase 21 14 - 72 U/L  TSH  Result Value Ref Range   TSH 3.080 0.450 - 4.500 uIU/mL  UA/M w/rflx Culture, Routine   Specimen: Urine   URINE  Result Value Ref Range   Specific Gravity, UA 1.020 1.005 - 1.030   pH, UA 6.5 5.0 - 7.5   Color, UA Yellow Yellow   Appearance Ur Clear Clear   Leukocytes, UA Negative Negative   Protein, UA 1+ (A) Negative/Trace   Glucose, UA Negative Negative   Ketones, UA Negative Negative   RBC, UA Negative Negative   Bilirubin, UA Negative Negative   Urobilinogen, Ur 0.2 0.2 - 1.0 mg/dL   Nitrite, UA Negative Negative   Microscopic Examination See below:    Urinalysis Reflex Comment       Assessment & Plan:   Problem List Items Addressed This Visit      Other   ADHD    Stable on current regimen.  Continue current regimen.  PDMP reviewed and refills given for 3 months.  Patient to return or call to clinic in meantime with concerns.      Relevant Medications   amphetamine-dextroamphetamine (ADDERALL) 20 MG tablet   amphetamine-dextroamphetamine (ADDERALL) 20 MG tablet (Start on 10/16/2019)   amphetamine-dextroamphetamine (ADDERALL) 20 MG tablet (Start on 11/16/2019)   Primary insomnia    Chronic, ongoing.  Will trial trazodone 25-50 mg at night for sleep.  Declines CBT for now but will reach out to [place] if she desires in the future.  Will f/u on insomnia in 4 weeks.      Relevant Medications   traZODone (DESYREL) 50 MG tablet    Other Visit Diagnoses    Recurrent oral herpes simplex    -  Primary   Will give Valtrex 2g bid x 1 day per guideline recommendations.  Patient to call or return to office if not better or worsening.   Relevant Medications   valACYclovir (VALTREX) 1000 MG tablet   Cough       PDMP reviewed and guaifenesin-codeine  ordered for cough.   Relevant Medications   guaiFENesin-codeine 100-10 MG/5ML syrup       Follow up plan: Return in about 4 weeks (around 10/13/2019) for insomnia, mood f/u.   This visit was completed via telephone due to the restrictions of the COVID-19 pandemic. All issues as above were discussed and addressed but no physical exam was performed. If it was felt that the patient should be evaluated in the office, they were directed there. The patient verbally consented to this visit. Patient was unable to complete an audio/visual visit due to Lack of equipment. . Location of the patient: home . Location of the provider: work . Those involved with this call:  . Provider: Mardene Celeste, DNP . CMA: Myrtha Mantis, CMA . Front Desk/Registration: Harriet Pho  . Time spent on call: 27 minutes on the phone discussing health concerns. 30 minutes total spent in review of patient's record and preparation of their chart.  I verified patient identity using two factors (patient name and date of birth). Patient consents verbally to being seen via telemedicine visit today.

## 2019-09-15 NOTE — Telephone Encounter (Signed)
Routing to provider  

## 2019-09-19 ENCOUNTER — Telehealth: Payer: Self-pay | Admitting: Family Medicine

## 2019-09-19 NOTE — Telephone Encounter (Signed)
Unable to lvm to make this apt/  

## 2019-09-19 NOTE — Telephone Encounter (Signed)
-----   Message from Wells Guiles, NP sent at 09/15/2019  1:40 PM EDT ----- Please call to schedule follow up

## 2019-09-25 NOTE — Telephone Encounter (Signed)
Unable to Lvm to make this apt  

## 2019-09-26 DIAGNOSIS — F411 Generalized anxiety disorder: Secondary | ICD-10-CM | POA: Diagnosis not present

## 2019-09-26 DIAGNOSIS — F321 Major depressive disorder, single episode, moderate: Secondary | ICD-10-CM | POA: Diagnosis not present

## 2019-09-26 NOTE — Telephone Encounter (Signed)
Pt did not want schedule at this time. She will call back

## 2019-09-29 DIAGNOSIS — G5712 Meralgia paresthetica, left lower limb: Secondary | ICD-10-CM | POA: Diagnosis not present

## 2019-09-29 DIAGNOSIS — M79605 Pain in left leg: Secondary | ICD-10-CM | POA: Diagnosis not present

## 2019-10-05 ENCOUNTER — Encounter: Payer: Self-pay | Admitting: Family Medicine

## 2019-10-05 ENCOUNTER — Other Ambulatory Visit: Payer: Self-pay

## 2019-10-05 ENCOUNTER — Telehealth (INDEPENDENT_AMBULATORY_CARE_PROVIDER_SITE_OTHER): Payer: Medicaid Other | Admitting: Family Medicine

## 2019-10-05 VITALS — Wt 156.0 lb

## 2019-10-05 DIAGNOSIS — M5432 Sciatica, left side: Secondary | ICD-10-CM

## 2019-10-05 DIAGNOSIS — J069 Acute upper respiratory infection, unspecified: Secondary | ICD-10-CM

## 2019-10-05 DIAGNOSIS — J45909 Unspecified asthma, uncomplicated: Secondary | ICD-10-CM | POA: Diagnosis not present

## 2019-10-05 DIAGNOSIS — R05 Cough: Secondary | ICD-10-CM

## 2019-10-05 DIAGNOSIS — F4321 Adjustment disorder with depressed mood: Secondary | ICD-10-CM | POA: Diagnosis not present

## 2019-10-05 DIAGNOSIS — J4521 Mild intermittent asthma with (acute) exacerbation: Secondary | ICD-10-CM

## 2019-10-05 MED ORDER — MONTELUKAST SODIUM 10 MG PO TABS: 10.0000 mg | ORAL_TABLET | Freq: Every day | ORAL | 3 refills | Status: DC

## 2019-10-05 MED ORDER — GUAIFENESIN-CODEINE 100-10 MG/5ML PO SOLN: 5.0000 mL | Freq: Every evening | ORAL | 0 refills | Status: DC | PRN

## 2019-10-05 MED ORDER — BREO ELLIPTA 200-25 MCG/INH IN AEPB: 1.0000 | INHALATION_SPRAY | Freq: Every day | RESPIRATORY_TRACT | 1 refills | Status: DC

## 2019-10-05 MED ORDER — DOXYCYCLINE HYCLATE 100 MG PO TABS: 100.0000 mg | ORAL_TABLET | Freq: Two times a day (BID) | ORAL | 0 refills | Status: DC

## 2019-10-05 NOTE — Progress Notes (Signed)
Wt 156 lb (70.8 kg)   BMI 31.51 kg/m    Subjective:    Patient ID: Laurie Roman, female    DOB: [birth year], 44 y.o.   MRN: [id number]  HPI: Laurie Roman is a 44 y.o. female  Chief Complaint  Patient presents with  . Cough    symptoms started about 2-3 days  . Nasal Congestion    . This visit was completed via MyChart due to the restrictions of the COVID-19 pandemic. All issues as above were discussed and addressed. Physical exam was done as above through visual confirmation on MyChart. If it was felt that the patient should be evaluated in the office, they were directed there. The patient verbally consented to this visit. . Location of the patient: home . Location of the provider: work . Those involved with this call:  . Provider: Roosvelt Maser, PA-C . CMA: Elton Sin, CMA . Front Desk/Registration: Harriet Pho  . Time spent on call: 30 minutes with patient face to face via video conference. More than 50% of this time was spent in counseling and coordination of care. 5 minutes total spent in review of patient's record and preparation of their chart. I verified patient identity using two factors (patient name and date of birth). Patient consents verbally to being seen via telemedicine visit today.   About 3 days of productive cough, hoarseness, headache, rhinorrhea, right ear pain. Does have asthma, stopped her symbicort and wanting to start breo instead. Denies fever, chills, sick contacts, recent travel, CP, SOB. Taking OTC cold and sinus medications without much relief.   Still dealing with left upper leg pain and back pain, went to UC and was told likely a pinched nerve. Given norco and prednisone which has not yet been helping the last few days. Has chronic back and neck issues and wants to get in with Pain Mgmt regarding injections to help with this. Family members have seen Dr. Metta Clines in the past and she would like to be evaluated there as well.     Relevant past  medical, surgical, family and social history reviewed and updated as indicated. Interim medical history since our last visit reviewed. Allergies and medications reviewed and updated.  Review of Systems  Per HPI unless specifically indicated above     Objective:    Wt 156 lb (70.8 kg)   BMI 31.51 kg/m   Wt Readings from Last 3 Encounters:  10/05/19 156 lb (70.8 kg)  09/15/19 153 lb (69.4 kg)  06/16/19 153 lb (69.4 kg)    Physical Exam Vitals and nursing note reviewed.  Constitutional:      General: She is not in acute distress.    Appearance: Normal appearance.  HENT:     Head: Atraumatic.     Right Ear: External ear normal.     Left Ear: External ear normal.     Nose: Congestion present.     Mouth/Throat:     Mouth: Mucous membranes are moist.     Pharynx: Oropharynx is clear. Posterior oropharyngeal erythema present.  Eyes:     Extraocular Movements: Extraocular movements intact.     Conjunctiva/sclera: Conjunctivae normal.  Cardiovascular:     Comments: Unable to assess via virtual visit Pulmonary:     Effort: Pulmonary effort is normal. No respiratory distress.  Musculoskeletal:        General: Normal range of motion.     Cervical back: Normal range of motion.  Skin:    General: Skin is  dry.     Findings: No erythema.  Neurological:     Mental Status: She is alert and oriented to person, place, and time.  Psychiatric:        Mood and Affect: Mood normal.        Thought Content: Thought content normal.        Judgment: Judgment normal.     Results for orders placed or performed in visit on 08/11/18  Ova and parasite examination   Specimen: Stool   ST  Result Value Ref Range   OVA + PARASITE EXAM Final report    O&P result 1 Comment   Fecal leukocytes   Specimen: Stool   ST  Result Value Ref Range   White Blood Cells (WBC), Stool Final report None Seen   Result 1 Comment   Stool C-Diff Toxin Assay   Specimen: Stool   ST  Result Value Ref Range    C difficile Toxins A+B, EIA Negative Negative  Microscopic Examination   URINE  Result Value Ref Range   WBC, UA 0-5 0 - 5 /hpf   RBC, UA None seen 0 - 2 /hpf   Epithelial Cells (non renal) 0-10 0 - 10 /hpf   Casts Present None seen /lpf   Cast Type Hyaline casts N/A   Bacteria, UA Many (A) None seen/Few  Urine Culture, Reflex   URINE  Result Value Ref Range   Urine Culture, Routine Final report (A)    Organism ID, Bacteria Enterococcus faecalis (A)    ORGANISM ID, BACTERIA Comment    Antimicrobial Susceptibility Comment   CBC with Differential/Platelet  Result Value Ref Range   WBC 12.4 (H) 3.4 - 10.8 x10E3/uL   RBC 4.34 3.77 - 5.28 x10E6/uL   Hemoglobin 14.1 11.1 - 15.9 g/dL   Hematocrit 07.3 71.0 - 46.6 %   MCV 92 79 - 97 fL   MCH 32.5 26.6 - 33.0 pg   MCHC 35.5 31.5 - 35.7 g/dL   RDW 62.6 94.8 - 54.6 %   Platelets 288 150 - 450 x10E3/uL   Neutrophils 59 Not Estab. %   Lymphs 29 Not Estab. %   Monocytes 8 Not Estab. %   Eos 2 Not Estab. %   Basos 1 Not Estab. %   Neutrophils Absolute 7.4 (H) 1.4 - 7.0 x10E3/uL   Lymphocytes Absolute 3.6 (H) 0.7 - 3.1 x10E3/uL   Monocytes Absolute 1.0 (H) 0.1 - 0.9 x10E3/uL   EOS (ABSOLUTE) 0.2 0.0 - 0.4 x10E3/uL   Basophils Absolute 0.1 0.0 - 0.2 x10E3/uL   Immature Granulocytes 1 Not Estab. %   Immature Grans (Abs) 0.1 0.0 - 0.1 x10E3/uL  Comprehensive metabolic panel  Result Value Ref Range   Glucose 74 65 - 99 mg/dL   BUN 6 6 - 24 mg/dL   Creatinine, Ser 2.70 0.57 - 1.00 mg/dL   GFR calc non Af Amer 102 >59 mL/min/1.73   GFR calc Af Amer 117 >59 mL/min/1.73   BUN/Creatinine Ratio 8 (L) 9 - 23   Sodium 138 134 - 144 mmol/L   Potassium 5.4 (H) 3.5 - 5.2 mmol/L   Chloride 96 96 - 106 mmol/L   CO2 21 20 - 29 mmol/L   Calcium 9.6 8.7 - 10.2 mg/dL   Total Protein 7.3 6.0 - 8.5 g/dL   Albumin 4.7 3.8 - 4.8 g/dL   Globulin, Total 2.6 1.5 - 4.5 g/dL   Albumin/Globulin Ratio 1.8 1.2 - 2.2   Bilirubin Total 0.3 0.0 -  1.2 mg/dL    Alkaline Phosphatase 94 39 - 117 IU/L   AST 57 (H) 0 - 40 IU/L   ALT 45 (H) 0 - 32 IU/L  Lipase  Result Value Ref Range   Lipase 21 14 - 72 U/L  TSH  Result Value Ref Range   TSH 3.080 0.450 - 4.500 uIU/mL  UA/M w/rflx Culture, Routine   Specimen: Urine   URINE  Result Value Ref Range   Specific Gravity, UA 1.020 1.005 - 1.030   pH, UA 6.5 5.0 - 7.5   Color, UA Yellow Yellow   Appearance Ur Clear Clear   Leukocytes, UA Negative Negative   Protein, UA 1+ (A) Negative/Trace   Glucose, UA Negative Negative   Ketones, UA Negative Negative   RBC, UA Negative Negative   Bilirubin, UA Negative Negative   Urobilinogen, Ur 0.2 0.2 - 1.0 mg/dL   Nitrite, UA Negative Negative   Microscopic Examination See below:    Urinalysis Reflex Comment       Assessment & Plan:   Problem List Items Addressed This Visit      Respiratory   Asthma    Will start breo in addition to prn albuterol during allergy seasons when asthma acts up the worst. Continue to monitor closely      Relevant Medications   fluticasone furoate-vilanterol (BREO ELLIPTA) 200-25 MCG/INH AEPB   montelukast (SINGULAIR) 10 MG tablet     Nervous and Auditory   Left sided sciatica    Referral placed per pt request to Dr. Primus Bravo for injection therapy to relieve her chronic back pain.       Relevant Orders   Ambulatory referral to Pain Clinic    Other Visit Diagnoses    Upper respiratory tract infection, unspecified type    -  Primary   Declines COVID testing, will quarantine. Mucinex, robitussin AC, supportive care. Return precautions given   Relevant Medications   guaiFENesin-codeine 100-10 MG/5ML syrup   Grief       Doing some better, still struggling at times but does not wish for grief counseling at this time.        Follow up plan: Return if symptoms worsen or fail to improve.

## 2019-10-06 ENCOUNTER — Other Ambulatory Visit: Payer: Self-pay | Admitting: Family Medicine

## 2019-10-06 MED ORDER — DULERA 100-5 MCG/ACT IN AERO: 2.0000 | INHALATION_SPRAY | Freq: Two times a day (BID) | RESPIRATORY_TRACT | 2 refills | Status: DC

## 2019-10-10 NOTE — Assessment & Plan Note (Signed)
Will start breo in addition to prn albuterol during allergy seasons when asthma acts up the worst. Continue to monitor closely

## 2019-10-10 NOTE — Assessment & Plan Note (Signed)
Referral placed per pt request to Dr. Metta Clines for injection therapy to relieve her chronic back pain.

## 2019-10-11 ENCOUNTER — Other Ambulatory Visit: Payer: Self-pay | Admitting: Family Medicine

## 2019-12-08 ENCOUNTER — Other Ambulatory Visit: Payer: Self-pay | Admitting: Family Medicine

## 2020-01-01 ENCOUNTER — Encounter: Payer: Self-pay | Admitting: Family Medicine

## 2020-01-01 ENCOUNTER — Telehealth (INDEPENDENT_AMBULATORY_CARE_PROVIDER_SITE_OTHER): Payer: Medicaid Other | Admitting: Family Medicine

## 2020-01-01 VITALS — Wt 152.0 lb

## 2020-01-01 DIAGNOSIS — G8929 Other chronic pain: Secondary | ICD-10-CM

## 2020-01-01 DIAGNOSIS — K219 Gastro-esophageal reflux disease without esophagitis: Secondary | ICD-10-CM | POA: Diagnosis not present

## 2020-01-01 DIAGNOSIS — F419 Anxiety disorder, unspecified: Secondary | ICD-10-CM

## 2020-01-01 DIAGNOSIS — F5101 Primary insomnia: Secondary | ICD-10-CM

## 2020-01-01 DIAGNOSIS — F4321 Adjustment disorder with depressed mood: Secondary | ICD-10-CM | POA: Diagnosis not present

## 2020-01-01 DIAGNOSIS — F902 Attention-deficit hyperactivity disorder, combined type: Secondary | ICD-10-CM | POA: Diagnosis not present

## 2020-01-01 DIAGNOSIS — M25562 Pain in left knee: Secondary | ICD-10-CM | POA: Diagnosis not present

## 2020-01-01 MED ORDER — AMPHETAMINE-DEXTROAMPHETAMINE 20 MG PO TABS: 20.0000 mg | ORAL_TABLET | Freq: Two times a day (BID) | ORAL | 0 refills | Status: DC

## 2020-01-01 MED ORDER — DIAZEPAM 5 MG PO TABS
5.0000 mg | ORAL_TABLET | Freq: Every day | ORAL | 0 refills | Status: DC | PRN
Start: 2020-01-01 — End: 2020-04-04

## 2020-01-01 MED ORDER — HYDROCODONE-ACETAMINOPHEN 5-325 MG PO TABS: 1.0000 | ORAL_TABLET | Freq: Four times a day (QID) | ORAL | 0 refills | Status: DC | PRN

## 2020-01-01 MED ORDER — PANTOPRAZOLE SODIUM 40 MG PO TBEC: 40.0000 mg | DELAYED_RELEASE_TABLET | Freq: Two times a day (BID) | ORAL | 1 refills | Status: DC

## 2020-01-01 NOTE — Progress Notes (Signed)
Wt 152 lb (68.9 kg)   BMI 30.70 kg/m    Subjective:    Patient ID: Laurie Roman, female    DOB: 10/17/1975, 44 y.o.   MRN: [id number]  HPI: Laurie Roman is a 44 y.o. female  Chief Complaint  Patient presents with  . ADHD  . Gastroesophageal Reflux  . Depression    . This visit was completed via MyChart due to the restrictions of the COVID-19 pandemic. All issues as above were discussed and addressed. Physical exam was done as above through visual confirmation on MyChart. If it was felt that the patient should be evaluated in the office, they were directed there. The patient verbally consented to this visit. . Location of the patient: home . Location of the provider: work . Those involved with this call:  . Provider: Roosvelt Maser, PA-C . CMA: Elton Sin, CMA . Front Desk/Registration: Adela Ports  . Time spent on call: 50 minutes with patient face to face via video conference. More than 50% of this time was spent in counseling and coordination of care. 15 minutes total spent in review of patient's record and preparation of their chart. I verified patient identity using two factors (patient name and date of birth). Patient consents verbally to being seen via telemedicine visit today.   Here today to discuss some chronic issues as well as some acute.   Left knee severely painful, missed her Pain Mgmt appt recently due to a death in the family. Waiting on a new appt. Requesting small supply of pain medication to bridge her until this time. Cannot sleep, barely able to perform ADLs due to severity of pain. Denies redness, swelling, heat, fevers.   Still dealing with severe grief . Was given valium back in December after the passing of her husband in a MVA and just finished them last week. States they are very helpful when she's in a particularly bad place mentally. Denies side effects. Agreeable now to going to Psychiatry for counseling and med mgmt given persistence of  grieving and mood/anxiety issues that are greatly affecting her daily life. Denies SI/HI, states she would never leave her kids.   Due for ADHD f/u. On 20 mg adderall BID prn which does help her with focus and task completion. Denies side effects, including CP, SOB, palpitations, appetite concerns.   Depression screen [facility] 2/9 01/01/2020 06/16/2019  Decreased Interest 3 3  Down, Depressed, Hopeless 3 3  PHQ - 2 Score 6 6  Altered sleeping 3 3  Tired, decreased energy 3 3  Change in appetite 3 3  Feeling bad or failure about yourself  1 3  Trouble concentrating 1 3  Moving slowly or fidgety/restless 3 3  Suicidal thoughts 0 0  PHQ-9 Score 20 24  Difficult doing work/chores Extremely dIfficult Extremely dIfficult   GAD 7 : Generalized Anxiety Score 01/01/2020 06/16/2019  Nervous, Anxious, on Edge 2 3  Control/stop worrying 3 3  Worry too much - different things 3 3  Trouble relaxing 3 3  Restless 2 3  Easily annoyed or irritable 3 3  Afraid - awful might happen 3 3  Total GAD 7 Score 19 21  Anxiety Difficulty Extremely difficult Extremely difficult   Relevant past medical, surgical, family and social history reviewed and updated as indicated. Interim medical history since our last visit reviewed. Allergies and medications reviewed and updated.  Review of Systems  Per HPI unless specifically indicated above     Objective:  Wt 152 lb (68.9 kg)   BMI 30.70 kg/m   Wt Readings from Last 3 Encounters:  01/01/20 152 lb (68.9 kg)  10/05/19 156 lb (70.8 kg)  09/15/19 153 lb (69.4 kg)    Physical Exam Vitals and nursing note reviewed.  Constitutional:      General: She is not in acute distress.    Appearance: Normal appearance.  HENT:     Head: Atraumatic.     Right Ear: External ear normal.     Left Ear: External ear normal.     Nose: Nose normal. No congestion.     Mouth/Throat:     Mouth: Mucous membranes are moist.     Pharynx: Oropharynx is clear. No posterior  oropharyngeal erythema.  Eyes:     Extraocular Movements: Extraocular movements intact.     Conjunctiva/sclera: Conjunctivae normal.  Cardiovascular:     Comments: Unable to assess via virtual visit Pulmonary:     Effort: Pulmonary effort is normal. No respiratory distress.  Musculoskeletal:        General: Normal range of motion.     Cervical back: Normal range of motion.  Skin:    General: Skin is dry.     Findings: No erythema.  Neurological:     Mental Status: She is alert and oriented to person, place, and time.  Psychiatric:        Thought Content: Thought content normal.        Judgment: Judgment normal.     Comments: Tearful, anxious     Results for orders placed or performed in visit on 08/11/18  Ova and parasite examination   Specimen: Stool   ST  Result Value Ref Range   OVA + PARASITE EXAM Final report    O&P result 1 Comment   Fecal leukocytes   Specimen: Stool   ST  Result Value Ref Range   White Blood Cells (WBC), Stool Final report None Seen   Result 1 Comment   Stool C-Diff Toxin Assay   Specimen: Stool   ST  Result Value Ref Range   C difficile Toxins A+B, EIA Negative Negative  Microscopic Examination   URINE  Result Value Ref Range   WBC, UA 0-5 0 - 5 /hpf   RBC, UA None seen 0 - 2 /hpf   Epithelial Cells (non renal) 0-10 0 - 10 /hpf   Casts Present None seen /lpf   Cast Type Hyaline casts N/A   Bacteria, UA Many (A) None seen/Few  Urine Culture, Reflex   URINE  Result Value Ref Range   Urine Culture, Routine Final report (A)    Organism ID, Bacteria Enterococcus faecalis (A)    ORGANISM ID, BACTERIA Comment    Antimicrobial Susceptibility Comment   CBC with Differential/Platelet  Result Value Ref Range   WBC 12.4 (H) 3.4 - 10.8 x10E3/uL   RBC 4.34 3.77 - 5.28 x10E6/uL   Hemoglobin 14.1 11.1 - 15.9 g/dL   Hematocrit 16.139.7 09.634.0 - 46.6 %   MCV 92 79 - 97 fL   MCH 32.5 26.6 - 33.0 pg   MCHC 35.5 31 - 35 g/dL   RDW 04.512.4 40.911.7 - 81.115.4 %    Platelets 288 150 - 450 x10E3/uL   Neutrophils 59 Not Estab. %   Lymphs 29 Not Estab. %   Monocytes 8 Not Estab. %   Eos 2 Not Estab. %   Basos 1 Not Estab. %   Neutrophils Absolute 7.4 (H) 1 - 7 x10E3/uL  Lymphocytes Absolute 3.6 (H) 0 - 3 x10E3/uL   Monocytes Absolute 1.0 (H) 0 - 0 x10E3/uL   EOS (ABSOLUTE) 0.2 0.0 - 0.4 x10E3/uL   Basophils Absolute 0.1 0 - 0 x10E3/uL   Immature Granulocytes 1 Not Estab. %   Immature Grans (Abs) 0.1 0.0 - 0.1 x10E3/uL  Comprehensive metabolic panel  Result Value Ref Range   Glucose 74 65 - 99 mg/dL   BUN 6 6 - 24 mg/dL   Creatinine, Ser 3.35 0.57 - 1.00 mg/dL   GFR calc non Af Amer 102 >59 mL/min/1.73   GFR calc Af Amer 117 >59 mL/min/1.73   BUN/Creatinine Ratio 8 (L) 9 - 23   Sodium 138 134 - 144 mmol/L   Potassium 5.4 (H) 3.5 - 5.2 mmol/L   Chloride 96 96 - 106 mmol/L   CO2 21 20 - 29 mmol/L   Calcium 9.6 8.7 - 10.2 mg/dL   Total Protein 7.3 6.0 - 8.5 g/dL   Albumin 4.7 3.8 - 4.8 g/dL   Globulin, Total 2.6 1.5 - 4.5 g/dL   Albumin/Globulin Ratio 1.8 1.2 - 2.2   Bilirubin Total 0.3 0.0 - 1.2 mg/dL   Alkaline Phosphatase 94 39 - 117 IU/L   AST 57 (H) 0 - 40 IU/L   ALT 45 (H) 0 - 32 IU/L  Lipase  Result Value Ref Range   Lipase 21 14 - 72 U/L  TSH  Result Value Ref Range   TSH 3.080 0.450 - 4.500 uIU/mL  UA/M w/rflx Culture, Routine   Specimen: Urine   URINE  Result Value Ref Range   Specific Gravity, UA 1.020 1.005 - 1.030   pH, UA 6.5 5.0 - 7.5   Color, UA Yellow Yellow   Appearance Ur Clear Clear   Leukocytes, UA Negative Negative   Protein, UA 1+ (A) Negative/Trace   Glucose, UA Negative Negative   Ketones, UA Negative Negative   RBC, UA Negative Negative   Bilirubin, UA Negative Negative   Urobilinogen, Ur 0.2 0.2 - 1.0 mg/dL   Nitrite, UA Negative Negative   Microscopic Examination See below:    Urinalysis Reflex Comment       Assessment & Plan:   Problem List Items Addressed This Visit      Digestive    Gastroesophageal reflux disease without esophagitis    Stable on protonix regimen, may take 1-2 times daily prn. Diet modifications reviewed      Relevant Medications   pantoprazole (PROTONIX) 40 MG tablet     Other   ADHD    Stable and well controlled, continue adderall regimen with close monitoring      Anxiety    Severe, exacerbated by numerous significant stressors. Refer to Psychiatry, continue valium prn. She is aware this supply should last her until established with Psychiatry. Declines additional medication until this consultation. Strict addiction and sedation precautions reviewed       Relevant Medications   diazepam (VALIUM) 5 MG tablet   Primary insomnia   Relevant Orders   Ambulatory referral to Psychiatry    Other Visit Diagnoses    Grief    -  Primary   Severe with poor coping. Refer to Psychiatry for eval and mgmt, declines trying new meds until consultation. Resources reviewed for in meantime   Relevant Orders   Ambulatory referral to Psychiatry   Chronic pain of left knee       Awaiting Pain Mgmt appt, bridge w/ small supply of norco. Precautions reviewed, particularly to  not take w/in 12 hours of valium. Ortho walk in if needed   Relevant Medications   HYDROcodone-acetaminophen (NORCO/VICODIN) 5-325 MG tablet       Follow up plan: Return in about 3 months (around 04/02/2020) for 6 month f/u.

## 2020-01-07 DIAGNOSIS — M79641 Pain in right hand: Secondary | ICD-10-CM | POA: Diagnosis not present

## 2020-01-07 DIAGNOSIS — S6991XA Unspecified injury of right wrist, hand and finger(s), initial encounter: Secondary | ICD-10-CM | POA: Diagnosis not present

## 2020-01-07 DIAGNOSIS — M25562 Pain in left knee: Secondary | ICD-10-CM | POA: Diagnosis not present

## 2020-01-08 NOTE — Assessment & Plan Note (Signed)
Stable and well controlled, continue adderall regimen with close monitoring

## 2020-01-08 NOTE — Assessment & Plan Note (Addendum)
Stable on protonix regimen, may take 1-2 times daily prn. Diet modifications reviewed

## 2020-01-08 NOTE — Assessment & Plan Note (Signed)
Severe, exacerbated by numerous significant stressors. Refer to Psychiatry, continue valium prn. She is aware this supply should last her until established with Psychiatry. Declines additional medication until this consultation. Strict addiction and sedation precautions reviewed

## 2020-01-09 ENCOUNTER — Encounter: Payer: Self-pay | Admitting: Family Medicine

## 2020-01-10 ENCOUNTER — Encounter: Payer: Self-pay | Admitting: Family Medicine

## 2020-01-10 ENCOUNTER — Other Ambulatory Visit: Payer: Self-pay | Admitting: Unknown Physician Specialty

## 2020-01-19 ENCOUNTER — Other Ambulatory Visit: Payer: Self-pay | Admitting: Family Medicine

## 2020-01-19 ENCOUNTER — Encounter: Payer: Self-pay | Admitting: Family Medicine

## 2020-01-19 NOTE — Telephone Encounter (Signed)
Requested medication (s) are due for refill today-unsure  Requested medication (s) are on the active medication list -yes-expired Rx  Future visit scheduled -no  Last refill: 11/03/19  Notes to clinic: Request for expired Rx  Requested Prescriptions  Pending Prescriptions Disp Refills   gabapentin (NEURONTIN) 600 MG tablet [Pharmacy Med Name: GABAPENTIN 600 MG TABLET] 180 tablet 0    Sig: Take 1 tablet (600 mg total) by mouth 3 (three) times daily as needed.      Neurology: Anticonvulsants - gabapentin Passed - 01/19/2020  4:06 PM      Passed - Valid encounter within last 12 months    Recent Outpatient Visits           2 weeks ago Grief   [facility] Roosvelt Maser [place], [place]   3 months ago Upper respiratory tract infection, unspecified type   [facility] Roosvelt Maser [place], [place]   4 months ago Recurrent oral herpes simplex   [facility] Mardene Celeste I, NP   7 months ago Grief   [facility] Roosvelt Maser [place], [place]   7 months ago Rash   [facility] Roosvelt Maser [place], [place]                  Requested Prescriptions  Pending Prescriptions Disp Refills   gabapentin (NEURONTIN) 600 MG tablet [Pharmacy Med Name: GABAPENTIN 600 MG TABLET] 180 tablet 0    Sig: Take 1 tablet (600 mg total) by mouth 3 (three) times daily as needed.      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

## 2020-01-19 NOTE — Telephone Encounter (Signed)
Routing to provider  

## 2020-01-19 NOTE — Telephone Encounter (Signed)
Patient has also sent MyChart message regarding dosage increase of Gabapentin.

## 2020-01-19 NOTE — Telephone Encounter (Signed)
Medication: gabapentin (NEURONTIN) 600 MG tablet [[id number]]  Patient is requesting 800mg . If not patient is needing a refill for the 600mg .  Has the patient contacted their pharmacy? YES  (Agent: If yes, when and what did the pharmacy advise?)  Preferred Pharmacy (with phone number or street name): [facility] [place], - [address]  Phone:  [phone] Fax:  [phone]     Agent: Please be advised that RX refills may take up to 3 business days. We ask that you follow-up with your pharmacy.

## 2020-01-22 ENCOUNTER — Other Ambulatory Visit: Payer: Self-pay | Admitting: Family Medicine

## 2020-01-22 DIAGNOSIS — J4521 Mild intermittent asthma with (acute) exacerbation: Secondary | ICD-10-CM

## 2020-01-22 MED ORDER — BEVESPI AEROSPHERE 9-4.8 MCG/ACT IN AERO: 2.0000 | INHALATION_SPRAY | Freq: Two times a day (BID) | RESPIRATORY_TRACT | 2 refills | Status: DC

## 2020-01-22 NOTE — Telephone Encounter (Signed)
Looks like I just refilled this the other day, she can take 1 extra tablet daily here and there and see if that helps

## 2020-01-22 NOTE — Telephone Encounter (Signed)
Routing to provider to advise.  

## 2020-01-22 NOTE — Telephone Encounter (Signed)
Currently 1 morning 1 around 3/4pm 2 at night  Sometime another one mid day and another if she wakes up in the middle of the night. Wants to know if it can move up to 800mg ? Advised pt that 2400mg  daily was max dosage per , PA-C. Pt stated that sometimes she will take her dads 600mg  if she needs it, she knows you're not supposed to share medication.    Rash under boobs, has used corn startch, used dry deodorant. Under stomach. Can you call in nystatin and diflucan?

## 2020-01-23 MED ORDER — NYSTATIN 100000 UNIT/GM EX CREA: 1.0000 "application " | TOPICAL_CREAM | Freq: Two times a day (BID) | CUTANEOUS | 0 refills | Status: DC

## 2020-01-23 NOTE — Telephone Encounter (Signed)
Nystatin sent. As Shanda Bumps told pt, 2400 mg gabapentin is max dose I'm comfortable with. Can try 600 mg dose up to 4 times daily

## 2020-01-24 NOTE — Telephone Encounter (Signed)
Tried calling patient back, no answer and VM full. Will try to call again later.

## 2020-01-25 NOTE — Telephone Encounter (Signed)
Patient notified of Rachel's message.  

## 2020-03-06 ENCOUNTER — Other Ambulatory Visit: Payer: Self-pay

## 2020-03-06 MED ORDER — RIZATRIPTAN BENZOATE 10 MG PO TABS: 10.0000 mg | ORAL_TABLET | ORAL | 0 refills | Status: DC | PRN

## 2020-03-10 IMAGING — MR MRI LUMBAR SPINE WITHOUT CONTRAST
4 of 5 series · 33 of 48 positions shown · non-contrast
Comparison: Lumbar radiographs 09/06/2013.  Lumbar MRI 08/11/2006.

CLINICAL DATA: 43-year-old female with progressed chronic low back
pain. Numbness tingling and burning in the left buttock worse at
night. Fall 1 week ago.

EXAM:
MRI LUMBAR SPINE WITHOUT CONTRAST
TECHNIQUE: Multiplanar, multisequence MR imaging of the lumbar spine was
performed. No intravenous contrast was administered.

[Series 5: T2 · sagittal · 4.0mm · 0.81mm/px · 8 of 17 slices shown (1 of 2)]
[im 1/17]
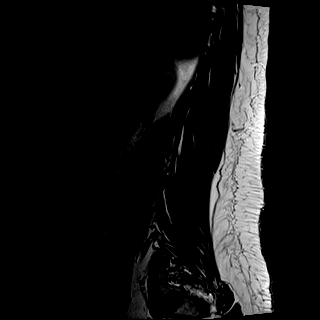
[im 3/17]
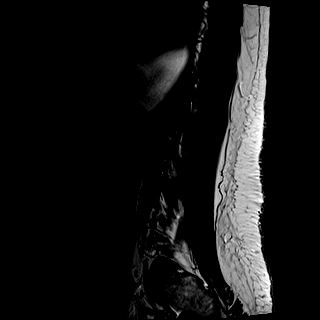
[im 5/17]
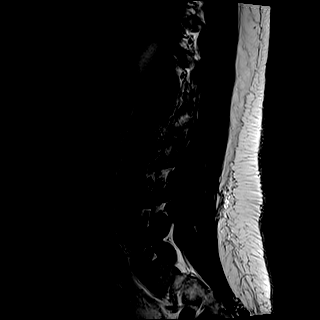
[im 7/17]
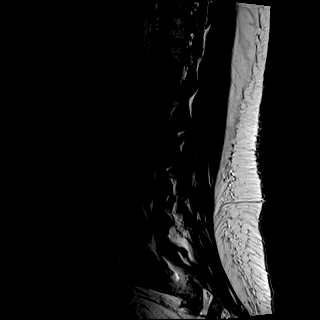
[im 10/17]
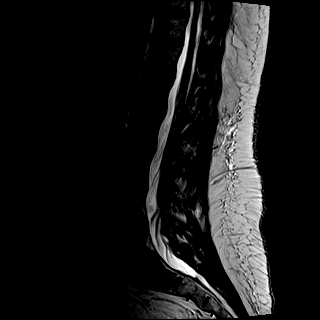
[im 12/17]
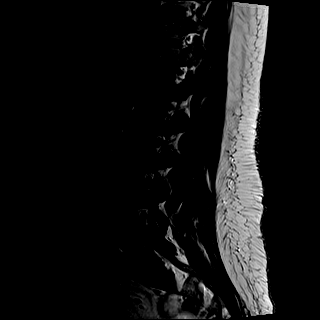
[im 14/17]
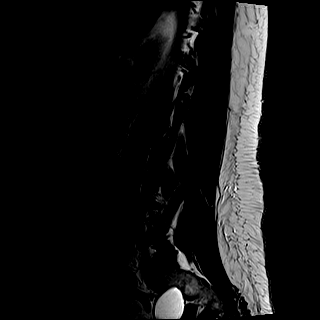
[im 17/17]
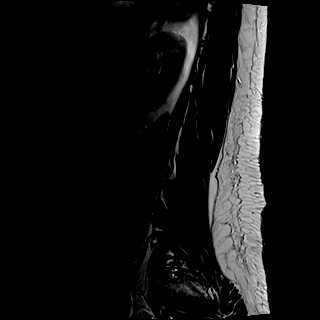

[Series 6: T1 · sagittal · 4.0mm · 0.81mm/px · 7 of 17 slices shown (1 of 2)]
[im 1/17]
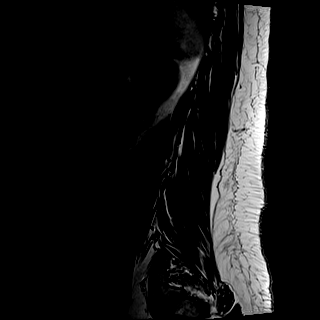
[im 3/17]
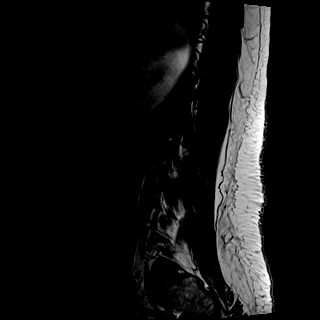
[im 6/17]
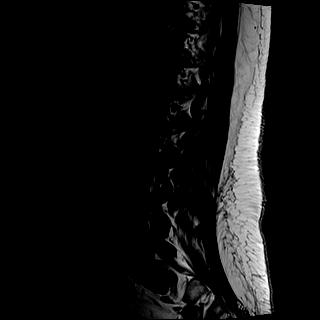
[im 9/17]
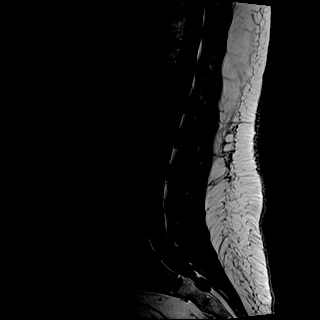
[im 11/17]
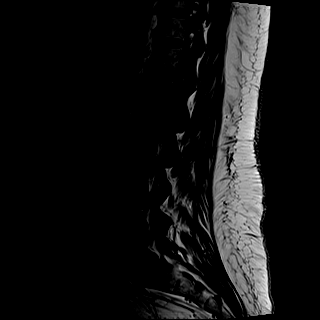
[im 14/17]
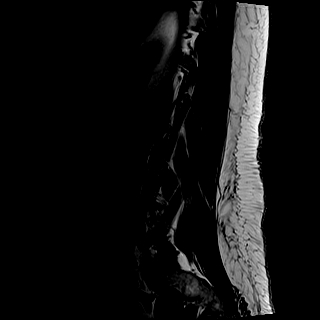
[im 17/17]
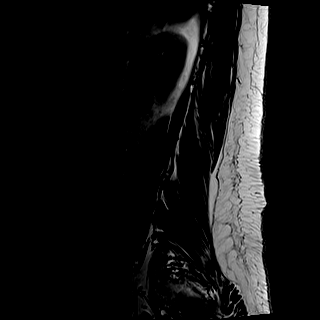

[Series 8: T2 · axial · 4.0mm · 0.78mm/px · z∈[-79,+124]mm · 9 of 31 slices shown (2 of 2)]
[im 1/31]
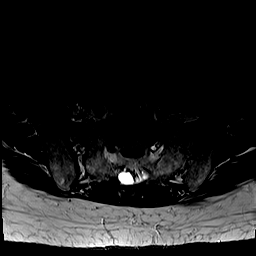
[im 6/31]
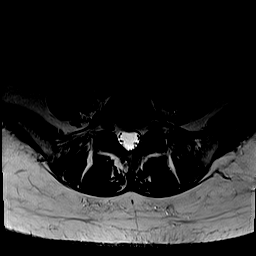
[im 11/31]
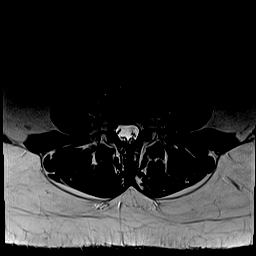
[im 13/31]
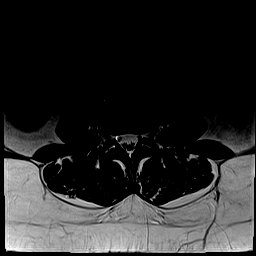
[im 16/31]
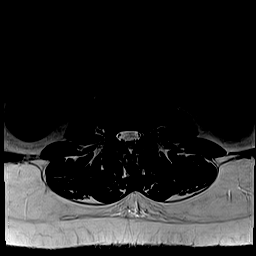
[im 18/31]
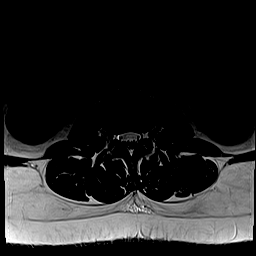
[im 21/31]
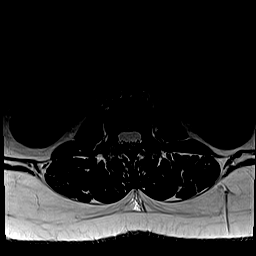
[im 26/31]
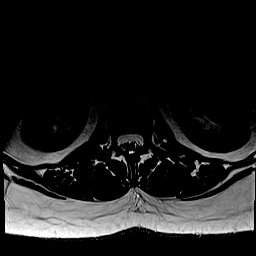
[im 31/31]
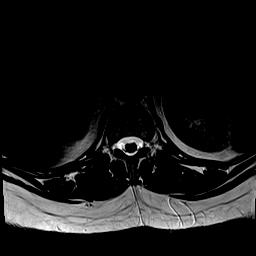

[Series 9: T1 · axial · 4.0mm · 0.39mm/px · z∈[-79,+124]mm · 9 of 31 slices shown (2 of 2)]
[im 1/31]
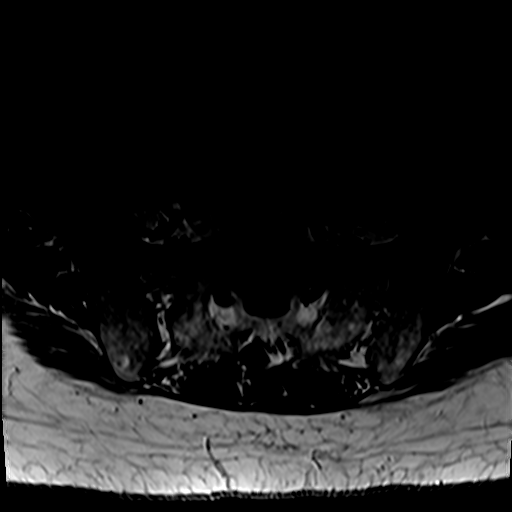
[im 6/31]
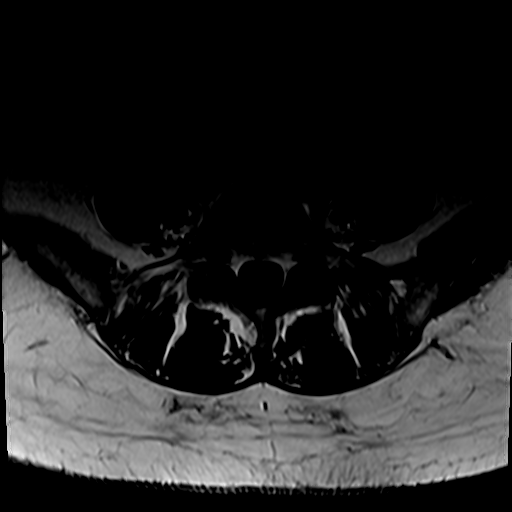
[im 11/31]
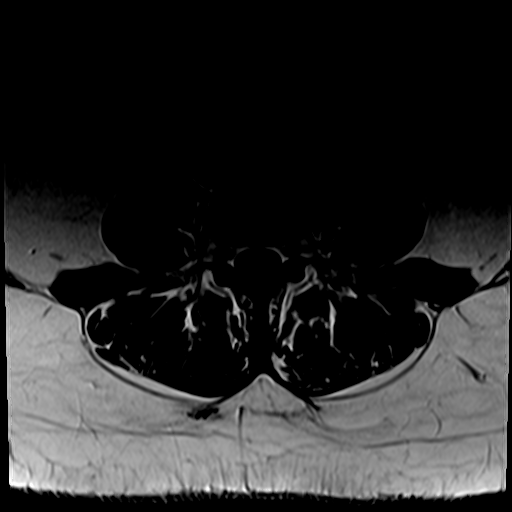
[im 13/31]
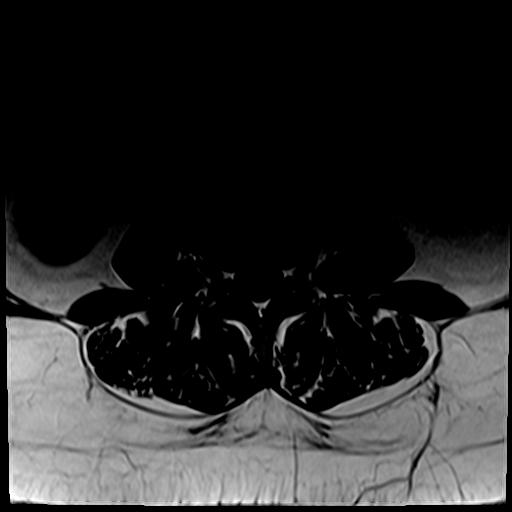
[im 16/31]
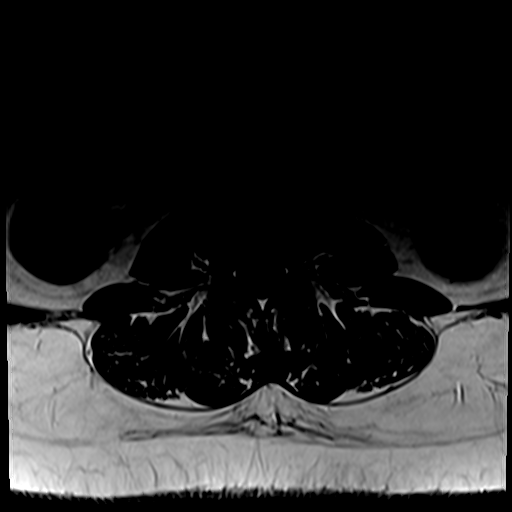
[im 18/31]
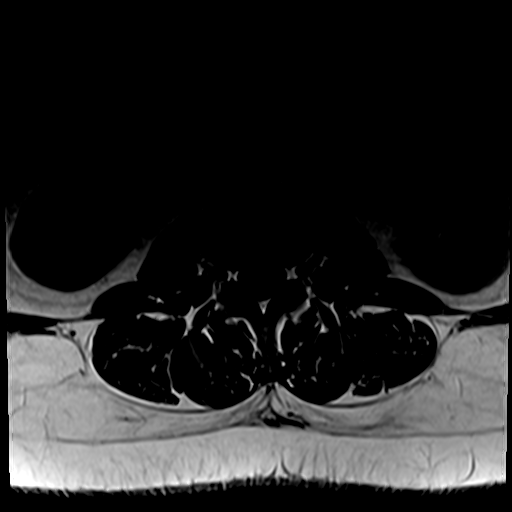
[im 21/31]
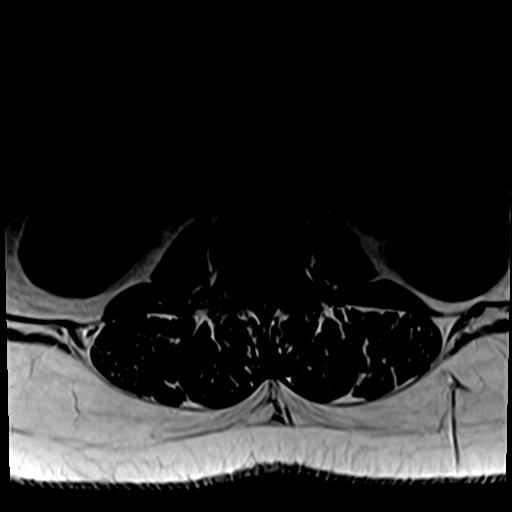
[im 26/31]
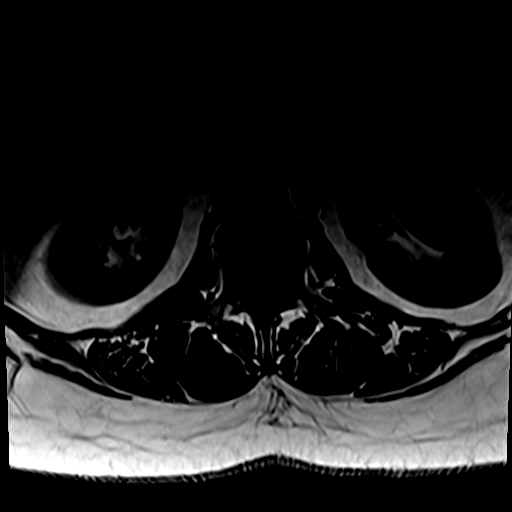
[im 31/31]
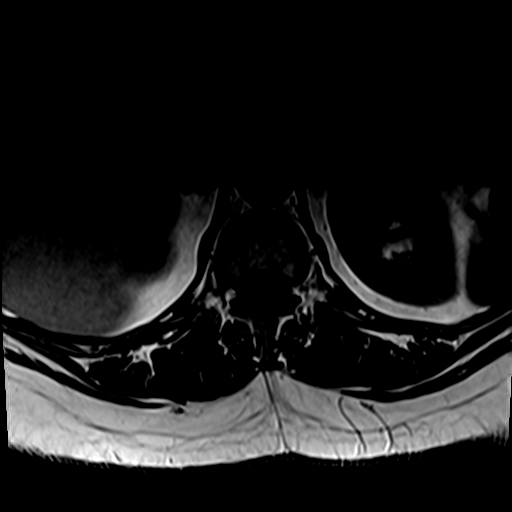

[33 of 48 positions shown; findings below may reference images not displayed]

FINDINGS: Segmentation:  Normal on the comparison radiographs.

Alignment:  Normal lumbar lordosis.

Vertebrae: No marrow edema or evidence of acute osseous abnormality.
Visualized bone marrow signal is within normal limits. Intact
visible sacrum and SI joints.

See left side presacral soft tissue findings below.

Conus medullaris and cauda equina: Conus extends to the L1 level. No
lower spinal cord or conus signal abnormality.

Paraspinal and other soft tissues: Normal visible abdominal viscera,
lower thoracic and lumbar paraspinal soft tissues.

There is a chronic at least 3 centimeter simple appearing cyst
extending ventrally from the left S1 neural foramen (series 5,
images 13 and 14, series 7, image 14). This was also partially
visible in 3559 and may have measured less than 3 centimeters at
that time. No regional soft tissue inflammation. There is a
superimposed small S2 level Tarlov cyst (normal variant).

Disc levels:

Normal visible lower thoracic disc spaces.

T12-L1:  Negative.

L1-L2:  Negative.

L2-L3:  Negative.

L3-L4:  Negative disc.  Mild facet hypertrophy.  No stenosis.

L4-L5:  Negative disc.  Mild facet hypertrophy.  No stenosis.

L5-S1:  Negative disc.  Mild facet hypertrophy.  No stenosis.
IMPRESSION: 1. Normal for age MRI appearance of the lower thoracic and lumbar
spine.
2. Chronic left sacral S1 level pseudomeningocele measuring at least
3 centimeters. Typically these are asymptomatic normal variants, but
perhaps this could be the symptomatic lesion if there is a Left S1
radiculitis.

## 2020-04-04 ENCOUNTER — Encounter: Payer: Self-pay | Admitting: Unknown Physician Specialty

## 2020-04-04 ENCOUNTER — Ambulatory Visit: Payer: Medicaid Other | Admitting: Unknown Physician Specialty

## 2020-04-04 ENCOUNTER — Telehealth (INDEPENDENT_AMBULATORY_CARE_PROVIDER_SITE_OTHER): Payer: Medicaid Other | Admitting: Unknown Physician Specialty

## 2020-04-04 DIAGNOSIS — J4521 Mild intermittent asthma with (acute) exacerbation: Secondary | ICD-10-CM

## 2020-04-04 MED ORDER — GUAIFENESIN-CODEINE 100-10 MG/5ML PO SOLN: 10.0000 mL | Freq: Three times a day (TID) | ORAL | 0 refills | Status: DC | PRN

## 2020-04-04 MED ORDER — AMPHETAMINE-DEXTROAMPHETAMINE 20 MG PO TABS: 20.0000 mg | ORAL_TABLET | Freq: Two times a day (BID) | ORAL | 0 refills | Status: DC

## 2020-04-04 MED ORDER — METRONIDAZOLE 0.75 % VA GEL: 1.0000 | Freq: Two times a day (BID) | VAGINAL | 0 refills | Status: DC

## 2020-04-04 MED ORDER — BEVESPI AEROSPHERE 9-4.8 MCG/ACT IN AERO: 2.0000 | INHALATION_SPRAY | Freq: Two times a day (BID) | RESPIRATORY_TRACT | 2 refills | Status: DC

## 2020-04-04 MED ORDER — DIAZEPAM 5 MG PO TABS: 5.0000 mg | ORAL_TABLET | Freq: Every day | ORAL | 0 refills | Status: DC | PRN

## 2020-04-04 NOTE — Progress Notes (Signed)
MyChart Video Visit    Virtual Visit via Video Note   This visit type was conducted due to national recommendations for restrictions regarding the COVID-19 Pandemic (e.g. social distancing) in an effort to limit this patient's exposure and mitigate transmission in our community. This patient is at least at moderate risk for complications without adequate follow up. This format is felt to be most appropriate for this patient at this time. Physical exam was limited by quality of the video and audio technology used for the visit.   Patient location: home Provider location: Work   I discussed the limitations of evaluation and management by telemedicine and the availability of in person appointments. The patient expressed understanding and agreed to proceed.  Patient: Laurie Roman   DOB: July 02, 1975   44 y.o. Female  MRN: [id number] Visit Date: 04/04/2020   Pt needed medications refilled but exposed to Covid and also has a bad cough.    Today's healthcare provider: Gabriel Cirri, NP   Chief Complaint  Patient presents with  . Covid Exposure    Patient presents today with concerns of exposure to covid from a coworker who was positive. Patient reports that she was exposed 4 days ago. Today patient reports that she has a productive cough with white sputum, heahache, shortness of breath ,runny nose, frequency of urination and vaginal odor. Patient denies fever or myalgia.. Patient states that she has taken Day/Nyquil with no relief. Patient states that she has not had her covid vaccine.    Subjective    Cough This is a new problem. Episode onset: 3 days. The problem has been gradually worsening. The problem occurs constantly. The cough is productive of sputum. Associated symptoms include chest pain, myalgias, rhinorrhea, a sore throat and shortness of breath. Pertinent negatives include no ear congestion, fever or rash. The symptoms are aggravated by lying down. She has tried prescription  cough suppressant and OTC cough suppressant for the symptoms. The treatment provided moderate relief.   HPI    Covid Exposure     Additional comments: Patient presents today with concerns of exposure to covid from a coworker who was positive. Patient reports that she was exposed 4 days ago. Today patient reports that she has a productive cough with white sputum, heahache, shortness of breath ,runny nose, frequency of urination and vaginal odor. Patient denies fever or myalgia.. Patient states that she has taken Day/Nyquil with no relief. Patient states that she has not had her covid vaccine.        Last edited by Fonda Kinder, CMA on 04/04/2020 10:16 AM. (History)      Pt also  needs to be seen for her chronic conditions in which she receives controlled substances. She is seen every 3 months for ADHD and Ventolin.  Also wants a refill of Amovig.  Neurologist has increased her dose from 70 to 140 mg she has not been able to get her neurologist to refill her medication and would like me to try  Patient has itching and a foul discharge from her vaginal area she is wondering if it is a yeast infection denies any high risk sexual exposures    Medications: Outpatient Medications Prior to Visit  Medication Sig  . albuterol (VENTOLIN HFA) 108 (90 Base) MCG/ACT inhaler Inhale 2 puffs into the lungs every 6 (six) hours as needed for wheezing or shortness of breath.  Marland Kitchen amLODipine (NORVASC) 5 MG tablet Take 1 tablet (5 mg total) by mouth daily.  Marland Kitchen  amphetamine-dextroamphetamine (ADDERALL) 20 MG tablet Take 1 tablet (20 mg total) by mouth 2 (two) times daily.  Marland Kitchen amphetamine-dextroamphetamine (ADDERALL) 20 MG tablet Take 1 tablet (20 mg total) by mouth 2 (two) times daily.  . benazepril (LOTENSIN) 40 MG tablet Take 1 tablet (40 mg total) by mouth daily.  Dorise Hiss (AIMOVIG) 70 MG/ML SOAJ Inject 70 mg into the skin every 30 (thirty) days.  . fluticasone (FLONASE) 50 MCG/ACT nasal spray Place  into the nose.  . fluticasone furoate-vilanterol (BREO ELLIPTA) 200-25 MCG/INH AEPB Inhale 1 puff into the lungs daily.  Marland Kitchen gabapentin (NEURONTIN) 600 MG tablet Take 1 tablet (600 mg total) by mouth 3 (three) times daily as needed.  Marland Kitchen HYDROcodone-acetaminophen (NORCO/VICODIN) 5-325 MG tablet Take 1 tablet by mouth every 6 (six) hours as needed for moderate pain.  . metoprolol succinate (TOPROL-XL) 100 MG 24 hr tablet Take 1 tablet (100 mg total) by mouth daily. Take with or immediately following a meal.  . pantoprazole (PROTONIX) 40 MG tablet Take 1 tablet (40 mg total) by mouth 2 (two) times daily before a meal.  . promethazine (PHENERGAN) 12.5 MG tablet TAKE (1) TABLET BY MOUTH EVERY 6 HOURS AS NEEDED FOR NAUSEA  . rizatriptan (MAXALT) 10 MG tablet Take 1 tablet (10 mg total) by mouth as needed for migraine. May repeat in 2 hours if needed  . traZODone (DESYREL) 50 MG tablet Take 0.5-1 tablets (25-50 mg total) by mouth at bedtime as needed for sleep.  . [DISCONTINUED] amphetamine-dextroamphetamine (ADDERALL) 20 MG tablet Take 1 tablet (20 mg total) by mouth 2 (two) times daily.  . [DISCONTINUED] diazepam (VALIUM) 5 MG tablet Take 1 tablet (5 mg total) by mouth daily as needed for anxiety.  . [DISCONTINUED] Glycopyrrolate-Formoterol (BEVESPI AEROSPHERE) 9-4.8 MCG/ACT AERO Inhale 2 puffs into the lungs 2 (two) times daily.  Marland Kitchen amphetamine-dextroamphetamine (ADDERALL) 20 MG tablet Take 1 tablet (20 mg total) by mouth 2 (two) times daily.  Marland Kitchen nystatin cream (MYCOSTATIN) Apply 1 application topically 2 (two) times daily. (Patient not taking: Reported on 04/04/2020)   No facility-administered medications prior to visit.    Review of Systems  Constitutional: Negative for fever.  HENT: Positive for rhinorrhea and sore throat.   Respiratory: Positive for cough and shortness of breath.   Cardiovascular: Positive for chest pain.  Musculoskeletal: Positive for myalgias.  Skin: Negative for rash.       Objective    There were no vitals taken for this visit.   Physical Exam Constitutional:      General: She is not in acute distress.    Appearance: Normal appearance. She is well-developed.  HENT:     Head: Normocephalic and atraumatic.  Eyes:     General: Lids are normal. No scleral icterus.       Right eye: No discharge.        Left eye: No discharge.     Conjunctiva/sclera: Conjunctivae normal.  Cardiovascular:     Rate and Rhythm: Normal rate.  Pulmonary:     Effort: Pulmonary effort is normal.  Abdominal:     Palpations: There is no hepatomegaly or splenomegaly.  Musculoskeletal:        General: Normal range of motion.  Skin:    Coloration: Skin is not pale.     Findings: No rash.  Neurological:     Mental Status: She is alert and oriented to person, place, and time.  Psychiatric:        Behavior: Behavior normal.  Thought Content: Thought content normal.        Judgment: Judgment normal.        Assessment & Plan     Diagnosis 1 Covid 19 assessment patient has had an exposure but has not yet been tested though symptomatic estimated symptoms for about 4 days patient encouraged to get testing and would be a good candidate for monoclonal antibodies if she is positive  Diagnosis 2 ADHD will refill medications for 1 month due to not being able to assess her today and will recheck next month  Anxiety Will refill Valium that she gets monthly assess in 1 month for reasons above  Vaginitis patient with foul odor and only mild itching will prescribe MetroGel twice daily for 7 days.  Area  Return in about 4 weeks (around 05/02/2020).     I discussed the assessment and treatment plan with the patient. The patient was provided an opportunity to ask questions and all were answered. The patient agreed with the plan and demonstrated an understanding of the instructions.   The patient was advised to call back or seek an in-person evaluation if the symptoms worsen or if  the condition fails to improve as anticipated.  I provided of non-face-to-face time during this encounter.    Gabriel Cirri, NP [facility] [phone] (phone) [phone] (fax)  [facility]

## 2020-04-29 ENCOUNTER — Other Ambulatory Visit: Payer: Self-pay | Admitting: Family Medicine

## 2020-04-29 NOTE — Telephone Encounter (Signed)
Requested medication (s) are due for refill today: yes  Requested medication (s) are on the active medication list: yes  Last refill:  01/21/20 #180 0 refills  Future visit scheduled: no   Notes to clinic:  last seen by C. Jamesetta Orleans, NP. Med ordered by R. [place], [place]. Can we give #90 0 refills per Jamesetta Orleans, NP?     Requested Prescriptions  Pending Prescriptions Disp Refills   gabapentin (NEURONTIN) 600 MG tablet [Pharmacy Med Name: GABAPENTIN 600 MG TABLET] 90 tablet 0    Sig: Take 1 tablet (600 mg total) by mouth 3 (three) times daily as needed.      Neurology: Anticonvulsants - gabapentin Passed - 04/29/2020  5:41 PM      Passed - Valid encounter within last 12 months    Recent Outpatient Visits           3 weeks ago Mild intermittent asthma with acute exacerbation   [facility] Gabriel Cirri, NP   3 months ago Grief   [facility] Roosvelt Maser [place], [place]   6 months ago Upper respiratory tract infection, unspecified type   [facility] Roosvelt Maser [place], [place]   7 months ago Recurrent oral herpes simplex   [facility] Valentino Nose, NP   10 months ago Grief   [facility] [place], [place]

## 2020-04-30 NOTE — Telephone Encounter (Signed)
Patient needs appt

## 2020-05-07 NOTE — Progress Notes (Deleted)
    SUBJECTIVE:   CHIEF COMPLAINT / HPI:   Anxiety - Medications: diazepam prn - Taking: *** - Counseling: *** - Previous hospitalizations: *** - FH of psych illness: *** - Current stressors: *** - Coping Mechanisms: ***   ADHD - Meds: adderall 20mg  BID - Last follow up: *** - Denies loss of appetite, GI upset, sleep disturbances, vision changes, behavior problems - Weight changes? *** - School performance: ***, in *** grade at *** - Relationships at home and with peers: ***  Hypertension: - Medications: amlodipine 5mg , benazepril 40mg  daily - Compliance: *** - Checking BP at home: *** - Denies any SOB, CP, vision changes, LE edema, medication SEs, or symptoms of hypotension - Diet: *** - Exercise: ***  Migraine - Meds: aimovig ***, gabapentin 600mg  TID prn, metoprolol 100mg  daily, maxalt prn  - episodes in last month: *** - typical headaches: ***  ***flu, tdap, covid vaccines  Asthma?***  PERTINENT  PMH / PSH: HTN, migraine, ADHD, mild OSA on CPAP, GERD, anxiety  OBJECTIVE:   There were no vitals taken for this visit.  ***  ASSESSMENT/PLAN:   No problem-specific Assessment & Plan notes found for this encounter.     , DO

## 2020-05-08 ENCOUNTER — Telehealth: Payer: Self-pay | Admitting: Family Medicine

## 2020-05-08 ENCOUNTER — Ambulatory Visit: Payer: Medicaid Other | Admitting: Family Medicine

## 2020-05-08 ENCOUNTER — Other Ambulatory Visit: Payer: Self-pay | Admitting: Family Medicine

## 2020-05-08 MED ORDER — AMPHETAMINE-DEXTROAMPHETAMINE 20 MG PO TABS: 20.0000 mg | ORAL_TABLET | Freq: Two times a day (BID) | ORAL | 0 refills | Status: DC

## 2020-05-08 NOTE — Telephone Encounter (Signed)
Routing to provider  

## 2020-05-08 NOTE — Telephone Encounter (Signed)
Patient call to apologize for missing appointment today. Patient states she had to take her father to the hospital.  Patient rescheduled to see Gabriel Cirri, FNP on  05/16/20. Patient also requested a partial refill on Adderall 20 MG, Valium 5 MG, and Neurontin 600 MG.  Patient states she is currently out of Adderall and having a hard time concentrating.  She is also dealing with the anniversary of her husband's sudden death from May 27, 2019.  Please call patient if partial supply is approved.

## 2020-05-08 NOTE — Telephone Encounter (Signed)
Will refill adderall with enough pills to last until appointment next week. Gabapentin was sent in last week so she should check with her pharmacy. Per previous notes, she was given valium to last her until she was able to be seen by Psychiatry which she never set up an appointment. She needs to be seen before any refills of that will be provided, ideally would be managed by Psychiatry as originally discussed.

## 2020-05-09 NOTE — Telephone Encounter (Signed)
Patient notified

## 2020-05-16 ENCOUNTER — Telehealth: Payer: Self-pay

## 2020-05-16 ENCOUNTER — Encounter: Payer: Self-pay | Admitting: Unknown Physician Specialty

## 2020-05-16 ENCOUNTER — Other Ambulatory Visit: Payer: Self-pay

## 2020-05-16 ENCOUNTER — Other Ambulatory Visit: Payer: Self-pay | Admitting: Unknown Physician Specialty

## 2020-05-16 ENCOUNTER — Ambulatory Visit (INDEPENDENT_AMBULATORY_CARE_PROVIDER_SITE_OTHER): Payer: Medicaid Other | Admitting: Unknown Physician Specialty

## 2020-05-16 VITALS — BP 156/92 | HR 106 | Temp 98.2°F | Wt 159.6 lb

## 2020-05-16 DIAGNOSIS — F902 Attention-deficit hyperactivity disorder, combined type: Secondary | ICD-10-CM

## 2020-05-16 DIAGNOSIS — J4521 Mild intermittent asthma with (acute) exacerbation: Secondary | ICD-10-CM | POA: Diagnosis not present

## 2020-05-16 DIAGNOSIS — M79671 Pain in right foot: Secondary | ICD-10-CM | POA: Diagnosis not present

## 2020-05-16 DIAGNOSIS — F322 Major depressive disorder, single episode, severe without psychotic features: Secondary | ICD-10-CM

## 2020-05-16 DIAGNOSIS — G4733 Obstructive sleep apnea (adult) (pediatric): Secondary | ICD-10-CM

## 2020-05-16 DIAGNOSIS — I10 Essential (primary) hypertension: Secondary | ICD-10-CM

## 2020-05-16 MED ORDER — AMPHETAMINE-DEXTROAMPHETAMINE 20 MG PO TABS
20.0000 mg | ORAL_TABLET | Freq: Two times a day (BID) | ORAL | 0 refills | Status: DC
Start: 2020-05-16 — End: 2020-06-13

## 2020-05-16 MED ORDER — LOSARTAN POTASSIUM 50 MG PO TABS: 50.0000 mg | ORAL_TABLET | Freq: Every day | ORAL | 3 refills | Status: DC

## 2020-05-16 MED ORDER — METHYLPREDNISOLONE 4 MG PO TBPK: ORAL_TABLET | ORAL | 0 refills | Status: DC

## 2020-05-16 MED ORDER — BEVESPI AEROSPHERE 9-4.8 MCG/ACT IN AERO: 2.0000 | INHALATION_SPRAY | Freq: Two times a day (BID) | RESPIRATORY_TRACT | 2 refills | Status: DC

## 2020-05-16 MED ORDER — ALBUTEROL SULFATE HFA 108 (90 BASE) MCG/ACT IN AERS: 2.0000 | INHALATION_SPRAY | Freq: Four times a day (QID) | RESPIRATORY_TRACT | 2 refills | Status: DC | PRN

## 2020-05-16 NOTE — Progress Notes (Signed)
BP (!) 156/92   Pulse (!) 106   Temp 98.2 F (36.8 C) (Oral)   Wt 159 lb 9.6 oz (72.4 kg)   SpO2 100%   BMI 32.24 kg/m    Subjective:    Patient ID: Laurie Roman, female    DOB: 21-Sep-1975, 44 y.o.   MRN: [id number]  HPI: Laurie Roman is a 44 y.o. female  Chief Complaint  Patient presents with  . ADHD  . Hypertension    Pt states she is not taking any of the blood pressure medications on her list because she states that they were not helping her blood pressure  . Depression  . Cough    Pt states she finished the cough syrup recently given to her but she states she still has a lingering cough  . Foot Pain    Pt states that her R foot has been hurting recently. States she had couple of hairline fractures in this area about a year ago   Depression Pt states she is struggling with deperssion.  She is coming up on Christmas and which is the anniversary of her husban'd death.  States depression medication made things worse.    Depression screen [facility] 2/9 05/16/2020 01/01/2020 06/16/2019  Decreased Interest 2 3 3   Down, Depressed, Hopeless 2 3 3   PHQ - 2 Score 4 6 6   Altered sleeping 2 3 3   Tired, decreased energy 2 3 3   Change in appetite 2 3 3   Feeling bad or failure about yourself  2 1 3   Trouble concentrating 2 1 3   Moving slowly or fidgety/restless 1 3 3   Suicidal thoughts 0 0 0  PHQ-9 Score 15 20 24   Difficult doing work/chores - Extremely dIfficult Extremely dIfficult     Cough States that she has had a cough ever since 10/28.  She never got tested for covid but those who live with her tested negative.  She takes the Proventil about 4-5 times/day.  She has run out of .  She is a non-smoker.  Vapes on occasion.    Hypertension Stopped taking all of her BP meds as what she was taking doesn't work.  States she does not want to take Amlodipine as it made her pee and mouth dry.  We discussed the Adderall she is taking and contribution to BP.  Takes BP at home with  numbers 130-140/90.  States it goes up with stress.  States this is worse with stress.    Foot pain Pt with forefoot pain.  States she sits most of the time.  Hurts when she first gets up.    Relevant past medical, surgical, family and social history reviewed and updated as indicated. Interim medical history since our last visit reviewed. Allergies and medications reviewed and updated.  Review of Systems  Per HPI unless specifically indicated above     Objective:    BP (!) 156/92   Pulse (!) 106   Temp 98.2 F (36.8 C) (Oral)   Wt 159 lb 9.6 oz (72.4 kg)   SpO2 100%   BMI 32.24 kg/m   Wt Readings from Last 3 Encounters:  05/16/20 159 lb 9.6 oz (72.4 kg)  01/01/20 152 lb (68.9 kg)  10/05/19 156 lb (70.8 kg)    Physical Exam Constitutional:      General: She is not in acute distress.    Appearance: Normal appearance. She is well-developed and well-nourished.  HENT:     Head: Normocephalic and atraumatic.  Eyes:     General: Lids are normal. No scleral icterus.       Right eye: No discharge.        Left eye: No discharge.     Conjunctiva/sclera: Conjunctivae normal.  Neck:     Vascular: No carotid bruit or JVD.  Cardiovascular:     Rate and Rhythm: Normal rate and regular rhythm.     Heart sounds: Normal heart sounds.  Pulmonary:     Effort: Pulmonary effort is normal.     Breath sounds: Normal breath sounds.  Abdominal:     Palpations: There is no hepatomegaly or splenomegaly.  Musculoskeletal:        General: Normal range of motion.     Cervical back: Normal range of motion and neck supple.  Skin:    General: Skin is warm, dry and intact.     Coloration: Skin is not pale.     Findings: No rash.  Neurological:     Mental Status: She is alert and oriented to person, place, and time.  Psychiatric:        Mood and Affect: Mood and affect normal.        Behavior: Behavior normal.        Thought Content: Thought content normal.        Judgment: Judgment normal.        Assessment & Plan:   Problem List Items Addressed This Visit      Unprioritized   ADHD    I feel patient should be under the guidance of a counselor.  She refuses for now.  Needs a regular provider and agreed to make no changes from the care plan of her previous provider until she can be seen by her new provider expected the end of next month      Asthma    Demonstrated poor inhaler technique and ran out of Bevespi.  Discussed inhaler technique and restarting Bevespi.        Relevant Medications   Glycopyrrolate-Formoterol (BEVESPI AEROSPHERE) 9-4.8 MCG/ACT AERO   albuterol (VENTOLIN HFA) 108 (90 Base) MCG/ACT inhaler   methylPREDNISolone (MEDROL DOSEPAK) 4 MG TBPK tablet   Depression, major, single episode, severe (HCC)    I suspect bipolar depression.  Unwilling to take medications at this time.  She was in a dark place and unwilling to take anything today.  Needs bipolar workup and consider Lithium and SSRI.        Essential hypertension    BP is high with better numbers with her anxiety.  I feel her Adderall is contributing but refuses to stop and feels it is the only thing that gives her motivation.  States she had high BP before she started Adderall.  Will start Losartan 50 mg daily      Relevant Medications   losartan (COZAAR) 50 MG tablet   Mild obstructive sleep apnea    Has a CPAP which she doesn't use.  I suspect this adds to issues       Other Visit Diagnoses    Foot pain, right    -  Primary   Right foot pain in forefoot.  Will order x-ray and medrol dose pack   Relevant Orders   DG Foot Complete Right       Follow up plan: Return in about 4 weeks (around 06/13/2020).

## 2020-05-16 NOTE — Assessment & Plan Note (Addendum)
BP is high with better numbers with her anxiety.  I feel her Adderall is contributing but refuses to stop and feels it is the only thing that gives her motivation.  States she had high BP before she started Adderall.  Will start Losartan 50 mg daily

## 2020-05-16 NOTE — Telephone Encounter (Signed)
Pt stated she needed her aderral RX refilled spoke with [facility] provider who she saw and stated she has sent medication in. Unable to lvm.

## 2020-05-16 NOTE — Assessment & Plan Note (Signed)
I feel patient should be under the guidance of a counselor.  She refuses for now.  Needs a regular provider and agreed to make no changes from the care plan of her previous provider until she can be seen by her new provider expected the end of next month

## 2020-05-16 NOTE — Assessment & Plan Note (Addendum)
I suspect bipolar depression.  Unwilling to take medications at this time.  She was in a dark place and unwilling to take anything today.  Needs bipolar workup and consider Lithium and SSRI.

## 2020-05-16 NOTE — Assessment & Plan Note (Signed)
Demonstrated poor inhaler technique and ran out of Bevespi.  Discussed inhaler technique and restarting Bevespi.

## 2020-05-16 NOTE — Assessment & Plan Note (Addendum)
Has a CPAP which she doesn't use.  I suspect this adds to issues

## 2020-05-17 ENCOUNTER — Other Ambulatory Visit: Payer: Self-pay | Admitting: Unknown Physician Specialty

## 2020-05-17 NOTE — Telephone Encounter (Signed)
Pharmacy has the medication, please refuse

## 2020-05-17 NOTE — Telephone Encounter (Signed)
Requested medication (s) are due for refill today: No  Requested medication (s) are on the active medication list: Yes  Last refill:  05/16/20  Future visit scheduled: Yes  Notes to clinic:  See request. Filled 05/16/20.    Requested Prescriptions  Pending Prescriptions Disp Refills   amphetamine-dextroamphetamine (ADDERALL) 20 MG tablet [Pharmacy Med Name: DEXTROAMP-AMPHETAMIN 20 MG TAB] 60 tablet 0    Sig: Take 1 tablet (20 mg total) by mouth 2 (two) times daily.      Not Delegated - Psychiatry:  Stimulants/ADHD Failed - 05/17/2020  2:04 PM      Failed - This refill cannot be delegated      Failed - Urine Drug Screen completed in last 360 days      Passed - Valid encounter within last 3 months    Recent Outpatient Visits           Yesterday Foot pain, right   [facility] Gabriel Cirri, NP   1 month ago Mild intermittent asthma with acute exacerbation   [facility] Gabriel Cirri, NP   4 months ago Grief   [facility] [place], [place]   7 months ago Upper respiratory tract infection, unspecified type   [facility] Roosvelt Maser [place], [place]   8 months ago Recurrent oral herpes simplex   [facility] Valentino Nose, NP       Future Appointments             In 3 weeks Laural Benes, Oralia Rud, DO [employer]

## 2020-06-04 ENCOUNTER — Other Ambulatory Visit: Payer: Self-pay | Admitting: Unknown Physician Specialty

## 2020-06-04 NOTE — Telephone Encounter (Signed)
Patient has an appt scheduled on 06/13/20

## 2020-06-04 NOTE — Telephone Encounter (Signed)
Requested medication (s) are due for refill today:no  Requested medication (s) are on the active medication list: yes   Last refill:  05/16/2020  Future visit scheduled:Yes  Notes to clinic:  this refill cannot be delegated    Requested Prescriptions  Pending Prescriptions Disp Refills   amphetamine-dextroamphetamine (ADDERALL) 20 MG tablet [Pharmacy Med Name: DEXTROAMP-AMPHETAMIN 20 MG TAB] 60 tablet 0    Sig: Take 1 tablet (20 mg total) by mouth 2 (two) times daily.      Not Delegated - Psychiatry:  Stimulants/ADHD Failed - 06/04/2020  8:56 AM      Failed - This refill cannot be delegated      Failed - Urine Drug Screen completed in last 360 days      Passed - Valid encounter within last 3 months    Recent Outpatient Visits           2 weeks ago Foot pain, right   [facility] Gabriel Cirri, NP   2 months ago Mild intermittent asthma with acute exacerbation   [facility] Gabriel Cirri, NP   5 months ago Grief   [facility] [place], [place]   8 months ago Upper respiratory tract infection, unspecified type   [facility] [place] Roosvelt Maser [place], [place]   8 months ago Recurrent oral herpes simplex   [facility] Valentino Nose, NP       Future Appointments             In 1 week Laural Benes, Oralia Rud, DO [employer], [facility]

## 2020-06-13 ENCOUNTER — Encounter: Payer: Self-pay | Admitting: Family Medicine

## 2020-06-13 ENCOUNTER — Telehealth (INDEPENDENT_AMBULATORY_CARE_PROVIDER_SITE_OTHER): Payer: Medicaid Other | Admitting: Family Medicine

## 2020-06-13 VITALS — BP 134/92

## 2020-06-13 DIAGNOSIS — Z20822 Contact with and (suspected) exposure to covid-19: Secondary | ICD-10-CM

## 2020-06-13 DIAGNOSIS — F902 Attention-deficit hyperactivity disorder, combined type: Secondary | ICD-10-CM | POA: Diagnosis not present

## 2020-06-13 MED ORDER — AMPHETAMINE-DEXTROAMPHETAMINE 20 MG PO TABS: 20.0000 mg | ORAL_TABLET | Freq: Two times a day (BID) | ORAL | 0 refills | Status: DC

## 2020-06-13 MED ORDER — BENZONATATE 200 MG PO CAPS: 200.0000 mg | ORAL_CAPSULE | Freq: Two times a day (BID) | ORAL | 0 refills | Status: DC | PRN

## 2020-06-13 MED ORDER — HYDROCOD POLST-CPM POLST ER 10-8 MG/5ML PO SUER: 5.0000 mL | Freq: Every evening | ORAL | 0 refills | Status: DC | PRN

## 2020-06-13 MED ORDER — PREDNISONE 50 MG PO TABS: 50.0000 mg | ORAL_TABLET | Freq: Every day | ORAL | 0 refills | Status: DC

## 2020-06-13 NOTE — Progress Notes (Signed)
BP (!) 134/92    Subjective:    Patient ID: Laurie Roman, female    DOB: July 04, 1975, 45 y.o.   MRN: [id number]  HPI: Laurie Roman is a 45 y.o. female  Chief Complaint  Patient presents with  . ADHD    Pt states cherly gave her a month supply of adderall when normally she gets 3 months   . Depression    Pt refused to do phq-9  . Anxiety   ADHD FOLLOW UP ADHD status: stable Satisfied with current therapy: yes Medication compliance:  excellent compliance Controlled substance contract: yes Previous psychiatry evaluation: no Previous medications: no    Taking meds on weekends/vacations: occasionally Work/school performance:  good Difficulty sustaining attention/completing tasks: no Distracted by extraneous stimuli: no Does not listen when spoken to: no  Fidgets with hands or feet: no Unable to stay in seat: no Blurts out/interrupts others: no ADHD Medication Side Effects: no    Decreased appetite: no    Headache: yes    Sleeping disturbance pattern: no    Irritability: no    Rebound effects (worse than baseline) off medication: no    Anxiousness: no    Dizziness: no    Tics: no  UPPER RESPIRATORY TRACT INFECTION Duration: 3-4 days Worst symptom: cough Fever: no Cough: yes Shortness of breath: yes Wheezing: yes Chest pain: yes, with cough Chest tightness: no Chest congestion: yes Nasal congestion: yes Runny nose: yes Post nasal drip: yes Sneezing: no Sore throat: yes Swollen glands: no Sinus pressure: no Headache: no Face pain: no Toothache: no Ear pain: no  Ear pressure: no  Eyes red/itching:no Eye drainage/crusting: no  Vomiting: no Rash: no Fatigue: no Sick contacts: yes- whole family has COVID Strep contacts: no  Context: worse Recurrent sinusitis: no Relief with OTC cold/cough medications: no  Treatments attempted: cough syrup    Relevant past medical, surgical, family and social history reviewed and updated as indicated. Interim  medical history since our last visit reviewed. Allergies and medications reviewed and updated.  Review of Systems  Constitutional: Negative.   Respiratory: Negative.   Cardiovascular: Negative.   Gastrointestinal: Negative.   Musculoskeletal: Negative.   Skin: Negative.   Psychiatric/Behavioral: Positive for decreased concentration. Negative for agitation, behavioral problems, confusion, dysphoric mood, hallucinations, self-injury, sleep disturbance and suicidal ideas. The patient is not nervous/anxious and is not hyperactive.     Per HPI unless specifically indicated above     Objective:    BP (!) 134/92   Wt Readings from Last 3 Encounters:  05/16/20 159 lb 9.6 oz (72.4 kg)  01/01/20 152 lb (68.9 kg)  10/05/19 156 lb (70.8 kg)    Physical Exam Vitals and nursing note reviewed.  Pulmonary:     Effort: Pulmonary effort is normal. No respiratory distress.     Comments: Speaking in full sentences Neurological:     Mental Status: She is alert.  Psychiatric:        Mood and Affect: Mood normal.        Behavior: Behavior normal.        Thought Content: Thought content normal.        Judgment: Judgment normal.     Results for orders placed or performed in visit on 08/11/18  Ova and parasite examination   Specimen: Stool   ST  Result Value Ref Range   OVA + PARASITE EXAM Final report    O&P result 1 Comment   Fecal leukocytes   Specimen: Stool  ST  Result Value Ref Range   White Blood Cells (WBC), Stool Final report None Seen   Result 1 Comment   Stool C-Diff Toxin Assay   Specimen: Stool   ST  Result Value Ref Range   C difficile Toxins A+B, EIA Negative Negative  Microscopic Examination   URINE  Result Value Ref Range   WBC, UA 0-5 0 - 5 /hpf   RBC, UA None seen 0 - 2 /hpf   Epithelial Cells (non renal) 0-10 0 - 10 /hpf   Casts Present None seen /lpf   Cast Type Hyaline casts N/A   Bacteria, UA Many (A) None seen/Few  Urine Culture, Reflex   URINE   Result Value Ref Range   Urine Culture, Routine Final report (A)    Organism ID, Bacteria Enterococcus faecalis (A)    ORGANISM ID, BACTERIA Comment    Antimicrobial Susceptibility Comment   CBC with Differential/Platelet  Result Value Ref Range   WBC 12.4 (H) 3.4 - 10.8 x10E3/uL   RBC 4.34 3.77 - 5.28 x10E6/uL   Hemoglobin 14.1 11.1 - 15.9 g/dL   Hematocrit 25.8 52.7 - 46.6 %   MCV 92 79 - 97 fL   MCH 32.5 26.6 - 33.0 pg   MCHC 35.5 31.5 - 35.7 g/dL   RDW 78.2 42.3 - 53.6 %   Platelets 288 150 - 450 x10E3/uL   Neutrophils 59 Not Estab. %   Lymphs 29 Not Estab. %   Monocytes 8 Not Estab. %   Eos 2 Not Estab. %   Basos 1 Not Estab. %   Neutrophils Absolute 7.4 (H) 1.4 - 7.0 x10E3/uL   Lymphocytes Absolute 3.6 (H) 0.7 - 3.1 x10E3/uL   Monocytes Absolute 1.0 (H) 0.1 - 0.9 x10E3/uL   EOS (ABSOLUTE) 0.2 0.0 - 0.4 x10E3/uL   Basophils Absolute 0.1 0.0 - 0.2 x10E3/uL   Immature Granulocytes 1 Not Estab. %   Immature Grans (Abs) 0.1 0.0 - 0.1 x10E3/uL  Comprehensive metabolic panel  Result Value Ref Range   Glucose 74 65 - 99 mg/dL   BUN 6 6 - 24 mg/dL   Creatinine, Ser 1.44 0.57 - 1.00 mg/dL   GFR calc non Af Amer 102 >59 mL/min/1.73   GFR calc Af Amer 117 >59 mL/min/1.73   BUN/Creatinine Ratio 8 (L) 9 - 23   Sodium 138 134 - 144 mmol/L   Potassium 5.4 (H) 3.5 - 5.2 mmol/L   Chloride 96 96 - 106 mmol/L   CO2 21 20 - 29 mmol/L   Calcium 9.6 8.7 - 10.2 mg/dL   Total Protein 7.3 6.0 - 8.5 g/dL   Albumin 4.7 3.8 - 4.8 g/dL   Globulin, Total 2.6 1.5 - 4.5 g/dL   Albumin/Globulin Ratio 1.8 1.2 - 2.2   Bilirubin Total 0.3 0.0 - 1.2 mg/dL   Alkaline Phosphatase 94 39 - 117 IU/L   AST 57 (H) 0 - 40 IU/L   ALT 45 (H) 0 - 32 IU/L  Lipase  Result Value Ref Range   Lipase 21 14 - 72 U/L  TSH  Result Value Ref Range   TSH 3.080 0.450 - 4.500 uIU/mL  UA/M w/rflx Culture, Routine   Specimen: Urine   URINE  Result Value Ref Range   Specific Gravity, UA 1.020 1.005 - 1.030   pH,  UA 6.5 5.0 - 7.5   Color, UA Yellow Yellow   Appearance Ur Clear Clear   Leukocytes, UA Negative Negative   Protein, UA 1+ (  A) Negative/Trace   Glucose, UA Negative Negative   Ketones, UA Negative Negative   RBC, UA Negative Negative   Bilirubin, UA Negative Negative   Urobilinogen, Ur 0.2 0.2 - 1.0 mg/dL   Nitrite, UA Negative Negative   Microscopic Examination See below:    Urinalysis Reflex Comment       Assessment & Plan:   Problem List Items Addressed This Visit      Other   ADHD    Under good control on current regimen. Continue current regimen. Continue to monitor. Call with any concerns. Refills given for 3 months. Follow up 3 months.         Other Visit Diagnoses    Suspected COVID-19 virus infection    -  Primary   Will swab. Self-quarantine until results are back. Will treat with prednisone and tussione. Call with any concerns or if not getting better. Continue to monitor   Relevant Orders   Novel Coronavirus, NAA ([facility])       Follow up plan: Return Before 09/13/20 with Santiago Glad or Dr. Neomia Dear to establish care.   . This visit was completed via MyChart due to the restrictions of the COVID-19 pandemic. All issues as above were discussed and addressed. Physical exam was done as above through visual confirmation on MyChart. If it was felt that the patient should be evaluated in the office, they were directed there. The patient verbally consented to this visit. . Location of the patient: home . Location of the provider: work . Those involved with this call:  . Provider: Park Liter, DO . CMA: Yvonna Alanis, [place] . Front Desk/Registration: Jill Side  . Time spent on call: 25 minutes with patient face to face via video conference. More than 50% of this time was spent in counseling and coordination of care. 40 minutes total spent in review of patient's record and preparation of their chart.

## 2020-06-23 ENCOUNTER — Encounter: Payer: Self-pay | Admitting: Family Medicine

## 2020-06-23 NOTE — Assessment & Plan Note (Signed)
Under good control on current regimen. Continue current regimen. Continue to monitor. Call with any concerns. Refills given for 3 months. Follow up 3 months.    

## 2020-07-02 ENCOUNTER — Other Ambulatory Visit: Payer: Self-pay

## 2020-07-02 NOTE — Telephone Encounter (Signed)
Patient has visits 06/23/20 and 05/16/20, previous Fleet Contras patient.

## 2020-07-03 ENCOUNTER — Other Ambulatory Visit: Payer: Self-pay | Admitting: Family Medicine

## 2020-07-03 MED ORDER — GABAPENTIN 600 MG PO TABS: 600.0000 mg | ORAL_TABLET | Freq: Three times a day (TID) | ORAL | 1 refills | Status: DC | PRN

## 2020-07-09 ENCOUNTER — Telehealth: Payer: Self-pay

## 2020-07-09 NOTE — Telephone Encounter (Signed)
-----   Message from Dorcas Carrow, [place] sent at 06/13/2020  4:43 PM EST ----- Before 09/13/20 with Clydie Braun or Dr. Charlotta Newton to establish care

## 2020-07-09 NOTE — Telephone Encounter (Signed)
Pt stated she would call back to make this apt. 

## 2020-07-17 ENCOUNTER — Other Ambulatory Visit: Payer: Self-pay

## 2020-07-17 DIAGNOSIS — Z20822 Contact with and (suspected) exposure to covid-19: Secondary | ICD-10-CM

## 2020-07-17 DIAGNOSIS — F902 Attention-deficit hyperactivity disorder, combined type: Secondary | ICD-10-CM

## 2020-07-17 DIAGNOSIS — G43909 Migraine, unspecified, not intractable, without status migrainosus: Secondary | ICD-10-CM

## 2020-07-17 MED ORDER — RIZATRIPTAN BENZOATE 10 MG PO TABS
10.0000 mg | ORAL_TABLET | ORAL | 0 refills | Status: DC | PRN
Start: 2020-07-17 — End: 2020-07-17

## 2020-07-17 MED ORDER — RIZATRIPTAN BENZOATE 10 MG PO TABS
10.0000 mg | ORAL_TABLET | ORAL | 0 refills | Status: DC | PRN
Start: 2020-07-17 — End: 2020-12-13

## 2020-07-17 NOTE — Telephone Encounter (Signed)
Dr. Laural Benes, please see message below.

## 2020-07-17 NOTE — Addendum Note (Signed)
Addended by: Dorcas Carrow on: 07/17/2020 08:21 PM   Modules accepted: Orders

## 2020-07-17 NOTE — Telephone Encounter (Signed)
[facility] stated Dr.Johnson is coverd under her medicaid but  Laurie Roman is not covered under new medicaid plan [place] Is asking to see if Dr.Johnson would send in medication instead so medication is covered.

## 2020-07-17 NOTE — Telephone Encounter (Signed)
Lvm to make apt.  

## 2020-07-17 NOTE — Telephone Encounter (Signed)
Patient last seen on 06-13-20. Needs follow up scheduled before April 8th per last office note.

## 2020-09-08 NOTE — Progress Notes (Signed)
There were no vitals taken for this visit.   Subjective:    Patient ID: Laurie Roman, female    DOB: 1976/04/25, 45 y.o.   MRN: [id number]  HPI: Laurie Roman is a 45 y.o. female  Chief Complaint  Patient presents with  . ADD  . Gastroesophageal Reflux  . nerve pain  . Sciatica   ADD Patient states her ADD is controlled on current medication regimen.  Patient is here today for a refill.  SCIATICA Patient is having an acute flare.  Has had to miss work due to pain.  Patient states Gabapentin is not helping but needs a refill.  Patient is requesting something for sciatic pain.  Patient states that she has tried motrin, flexeril, and baclofen.  Patient is requesting Hydrocodone because that is what she has been previously given.    Patient declines to answer PHQ.   Relevant past medical, surgical, family and social history reviewed and updated as indicated. Interim medical history since our last visit reviewed. Allergies and medications reviewed and updated.  Review of Systems  Musculoskeletal: Positive for back pain.  Psychiatric/Behavioral: Positive for decreased concentration.    Per HPI unless specifically indicated above     Objective:    There were no vitals taken for this visit.  Wt Readings from Last 3 Encounters:  05/16/20 159 lb 9.6 oz (72.4 kg)  01/01/20 152 lb (68.9 kg)  10/05/19 156 lb (70.8 kg)    Physical Exam Vitals and nursing note reviewed.  Constitutional:      General: She is not in acute distress.    Appearance: She is not ill-appearing.  HENT:     Head: Normocephalic.     Right Ear: Hearing normal.     Left Ear: Hearing normal.     Nose: Nose normal.  Pulmonary:     Effort: Pulmonary effort is normal. No respiratory distress.  Neurological:     Mental Status: She is alert.  Psychiatric:        Mood and Affect: Mood normal.        Behavior: Behavior normal.        Thought Content: Thought content normal.        Judgment: Judgment  normal.      Assessment & Plan:   Problem List Items Addressed This Visit      Nervous and Auditory   Left sided sciatica    Chronic.  Uncontrolled.  Patient has tolerated Toradol injections in the past.  Will give patient oral Toradol to help with pain.  She is not having relief from Ibuprofen, flexeril, or baclofen.  Patient requested referral to pain management for ongoing treatment of back pain.       Relevant Medications   gabapentin (NEURONTIN) 600 MG tablet   amphetamine-dextroamphetamine (ADDERALL) 20 MG tablet (Start on 09/13/2020)   amphetamine-dextroamphetamine (ADDERALL) 20 MG tablet (Start on 10/13/2020)   amphetamine-dextroamphetamine (ADDERALL) 20 MG tablet (Start on 11/13/2020)   Other Relevant Orders   Ambulatory referral to Pain Clinic     Other   ADHD - Primary    Under good control on current regimen. Continue current regimen. Continue to monitor. Call with any concerns. Refills given for 3 months. Follow up 3 months. Will sign controlled substance agreement and obtain UDS at that time.            Follow up plan: Return in about 3 months (around 12/09/2020) for ADHD FU (UDS and agreement).    This  visit was completed via MyChart due to the restrictions of the COVID-19 pandemic. All issues as above were discussed and addressed. Physical exam was done as above through visual confirmation on MyChart. If it was felt that the patient should be evaluated in the office, they were directed there. The patient verbally consented to this visit. 1. Location of the patient: Home 2. Location of the provider: Office 3. Those involved with this call:  ? Provider: Larae Grooms, NP ? CMA: Tiffany Reel, CMA ? Front Desk/Registration: Harriet Pho 4. Time spent on call: 20 minutes with patient face to face via video conference. More than 50% of this time was spent in counseling and coordination of care. 30 minutes total spent in review of patient's record and preparation of  their chart.

## 2020-09-09 ENCOUNTER — Encounter: Payer: Self-pay | Admitting: Nurse Practitioner

## 2020-09-09 ENCOUNTER — Other Ambulatory Visit: Payer: Self-pay

## 2020-09-09 ENCOUNTER — Telehealth (INDEPENDENT_AMBULATORY_CARE_PROVIDER_SITE_OTHER): Payer: Medicaid Other | Admitting: Nurse Practitioner

## 2020-09-09 ENCOUNTER — Telehealth: Payer: Self-pay

## 2020-09-09 DIAGNOSIS — M5432 Sciatica, left side: Secondary | ICD-10-CM

## 2020-09-09 DIAGNOSIS — F419 Anxiety disorder, unspecified: Secondary | ICD-10-CM

## 2020-09-09 DIAGNOSIS — F902 Attention-deficit hyperactivity disorder, combined type: Secondary | ICD-10-CM

## 2020-09-09 DIAGNOSIS — F322 Major depressive disorder, single episode, severe without psychotic features: Secondary | ICD-10-CM

## 2020-09-09 MED ORDER — AIMOVIG 70 MG/ML ~~LOC~~ SOAJ: 70.0000 mg | SUBCUTANEOUS | 3 refills | Status: DC

## 2020-09-09 MED ORDER — KETOROLAC TROMETHAMINE 10 MG PO TABS: 10.0000 mg | ORAL_TABLET | Freq: Four times a day (QID) | ORAL | 0 refills | Status: DC | PRN

## 2020-09-09 MED ORDER — AMPHETAMINE-DEXTROAMPHETAMINE 20 MG PO TABS: 20.0000 mg | ORAL_TABLET | Freq: Two times a day (BID) | ORAL | 0 refills | Status: DC

## 2020-09-09 MED ORDER — FLUTICASONE PROPIONATE 50 MCG/ACT NA SUSP: 1.0000 | Freq: Every day | NASAL | 1 refills | Status: DC

## 2020-09-09 MED ORDER — GABAPENTIN 600 MG PO TABS: 600.0000 mg | ORAL_TABLET | Freq: Three times a day (TID) | ORAL | 1 refills | Status: DC | PRN

## 2020-09-09 MED ORDER — ALBUTEROL SULFATE HFA 108 (90 BASE) MCG/ACT IN AERS: 2.0000 | INHALATION_SPRAY | Freq: Four times a day (QID) | RESPIRATORY_TRACT | 2 refills | Status: DC | PRN

## 2020-09-09 NOTE — Telephone Encounter (Signed)
lvm to make this apt.  

## 2020-09-09 NOTE — Assessment & Plan Note (Signed)
Under good control on current regimen. Continue current regimen. Continue to monitor. Call with any concerns. Refills given for 3 months. Follow up 3 months. Will sign controlled substance agreement and obtain UDS at that time.

## 2020-09-09 NOTE — Assessment & Plan Note (Signed)
Chronic.  Uncontrolled.  Patient has tolerated Toradol injections in the past.  Will give patient oral Toradol to help with pain.  She is not having relief from Ibuprofen, flexeril, or baclofen.  Patient requested referral to pain management for ongoing treatment of back pain.

## 2020-09-10 ENCOUNTER — Telehealth: Payer: Self-pay

## 2020-09-10 NOTE — Telephone Encounter (Signed)
  Return in about 3 months (around 12/09/2020) for ADHD FU (UDS and agreement).

## 2020-09-10 NOTE — Telephone Encounter (Signed)
PA submitted via Cover my meds, for Aimovig. Awaiting approval or denial.  Key: [id number]

## 2020-09-24 NOTE — Telephone Encounter (Signed)
LVM TO MAKE APT 

## 2020-10-01 NOTE — Telephone Encounter (Signed)
Pt stated she would call back to make this apt. 

## 2020-10-21 DIAGNOSIS — M5430 Sciatica, unspecified side: Secondary | ICD-10-CM

## 2020-10-21 MED ORDER — CYCLOBENZAPRINE HCL 10 MG PO TABS: 10.0000 mg | ORAL_TABLET | Freq: Three times a day (TID) | ORAL | 0 refills | Status: DC | PRN

## 2020-10-28 ENCOUNTER — Other Ambulatory Visit: Payer: Self-pay | Admitting: Nurse Practitioner

## 2020-10-28 NOTE — Telephone Encounter (Signed)
Requested Prescriptions  Pending Prescriptions Disp Refills  . gabapentin (NEURONTIN) 600 MG tablet [Pharmacy Med Name: GABAPENTIN 600 MG TABLET] 90 tablet 0    Sig: Take 1 tablet (600 mg total) by mouth 3 (three) times daily as needed.     Neurology: Anticonvulsants - gabapentin Passed - 10/28/2020  3:55 PM      Passed - Valid encounter within last 12 months    Recent Outpatient Visits          1 month ago Attention deficit hyperactivity disorder (ADHD), combined type   [facility] Larae Grooms, NP   4 months ago Suspected COVID-19 virus infection   [facility] [place], [place], DO   5 months ago Foot pain, right   [facility] Gabriel Cirri, NP   6 months ago Mild intermittent asthma with acute exacerbation   [facility] Gabriel Cirri, NP   10 months ago Grief   [facility] [place], [place]

## 2020-10-29 ENCOUNTER — Telehealth: Payer: Self-pay | Admitting: Nurse Practitioner

## 2020-10-29 NOTE — Telephone Encounter (Signed)
..  Patient declines further follow up and engagement by the Managed Medicaid Team. Appropriate care team members and provider have been notified via electronic communication. The Managed Medicaid Team is available to follow up with the patient after provider conversation with the patient regarding recommendation for engagement and subsequent re-referral to the Managed Medicaid Team.    Jennifer Alley Care Guide, High Risk Medicaid Managed Care Embedded Care Coordination [place]  [facility]   

## 2020-11-12 ENCOUNTER — Telehealth: Payer: Self-pay

## 2020-11-12 NOTE — Telephone Encounter (Signed)
PA for Aimovig initiated and submitted via Cover My Meds. Key: [id number]

## 2020-11-21 ENCOUNTER — Other Ambulatory Visit: Payer: Self-pay

## 2020-11-22 MED ORDER — GABAPENTIN 600 MG PO TABS: 600.0000 mg | ORAL_TABLET | Freq: Three times a day (TID) | ORAL | 0 refills | Status: DC | PRN

## 2020-11-28 ENCOUNTER — Ambulatory Visit: Payer: Self-pay | Admitting: Student in an Organized Health Care Education/Training Program

## 2020-11-28 ENCOUNTER — Encounter: Payer: Self-pay | Admitting: Student in an Organized Health Care Education/Training Program

## 2020-12-11 NOTE — Progress Notes (Signed)
Ht 4\' 11"  (1.499 m)   Wt 156 lb (70.8 kg)   BMI 31.51 kg/m    Subjective:    Patient ID: , female    DOB: 1976-05-04, 45 y.o.   MRN: [id number]  HPI: Laurie Roman is a 45 y.o. female  Chief Complaint  Patient presents with   Medication Refill   Obesity    Would like to see about trying saxenda   ADHD FOLLOW UP ADHD status: controlled Satisfied with current therapy: yes Medication compliance:  excellent compliance Controlled substance contract: no Previous psychiatry evaluation: no Previous medications: no adderall   Taking meds on weekends/vacations: occasionally Work/school performance:  excellent Difficulty sustaining attention/completing tasks: yes Distracted by extraneous stimuli: yes Does not listen when spoken to: no  Fidgets with hands or feet: yes Unable to stay in seat: no Blurts out/interrupts others: no ADHD Medication Side Effects: no    Decreased appetite: no    Headache: no    Sleeping disturbance pattern: yes    Irritability: no    Rebound effects (worse than baseline) off medication: no    Anxiousness: no    Dizziness: no    Tics: no   Patient states her sciatica has been acting up because she is on her feet a lot more at her new job.  She occasionally takes 4 gabapentin to help with the pain. She plans to get into pain management once she has more leave built up.    Relevant past medical, surgical, family and social history reviewed and updated as indicated. Interim medical history since our last visit reviewed. Allergies and medications reviewed and updated.  Review of Systems  Constitutional:  Negative for appetite change.  Neurological:  Negative for dizziness.  Psychiatric/Behavioral:  Positive for decreased concentration. Negative for agitation and sleep disturbance. The patient is not nervous/anxious.    Per HPI unless specifically indicated above     Objective:    Ht 4\' 11"  (1.499 m)   Wt 156 lb (70.8 kg)   BMI  31.51 kg/m   Wt Readings from Last 3 Encounters:  12/12/20 156 lb (70.8 kg)  05/16/20 159 lb 9.6 oz (72.4 kg)  01/01/20 152 lb (68.9 kg)    Physical Exam Vitals and nursing note reviewed.  HENT:     Head: Normocephalic.     Right Ear: Hearing normal.     Left Ear: Hearing normal.     Nose: Nose normal.  Eyes:     Pupils: Pupils are equal, round, and reactive to light.  Pulmonary:     Effort: Pulmonary effort is normal. No respiratory distress.  Neurological:     Mental Status: She is alert.  Psychiatric:        Mood and Affect: Mood normal.        Behavior: Behavior normal.        Thought Content: Thought content normal.        Judgment: Judgment normal.    Results for orders placed or performed in visit on 08/11/18  Ova and parasite examination   Specimen: Stool   ST  Result Value Ref Range   OVA + PARASITE EXAM Final report    O&P result 1 Comment   Fecal leukocytes   Specimen: Stool   ST  Result Value Ref Range   White Blood Cells (WBC), Stool Final report None Seen   Result 1 Comment   Stool C-Diff Toxin Assay   Specimen: Stool   ST  Result  Value Ref Range   C difficile Toxins A+B, EIA Negative Negative  Microscopic Examination   URINE  Result Value Ref Range   WBC, UA 0-5 0 - 5 /hpf   RBC, UA None seen 0 - 2 /hpf   Epithelial Cells (non renal) 0-10 0 - 10 /hpf   Casts Present None seen /lpf   Cast Type Hyaline casts N/A   Bacteria, UA Many (A) None seen/Few  Urine Culture, Reflex   URINE  Result Value Ref Range   Urine Culture, Routine Final report (A)    Organism ID, Bacteria Enterococcus faecalis (A)    ORGANISM ID, BACTERIA Comment    Antimicrobial Susceptibility Comment   CBC with Differential/Platelet  Result Value Ref Range   WBC 12.4 (H) 3.4 - 10.8 x10E3/uL   RBC 4.34 3.77 - 5.28 x10E6/uL   Hemoglobin 14.1 11.1 - 15.9 g/dL   Hematocrit 10.1 75.1 - 46.6 %   MCV 92 79 - 97 fL   MCH 32.5 26.6 - 33.0 pg   MCHC 35.5 31.5 - 35.7 g/dL   RDW  02.5 85.2 - 77.8 %   Platelets 288 150 - 450 x10E3/uL   Neutrophils 59 Not Estab. %   Lymphs 29 Not Estab. %   Monocytes 8 Not Estab. %   Eos 2 Not Estab. %   Basos 1 Not Estab. %   Neutrophils Absolute 7.4 (H) 1.4 - 7.0 x10E3/uL   Lymphocytes Absolute 3.6 (H) 0.7 - 3.1 x10E3/uL   Monocytes Absolute 1.0 (H) 0.1 - 0.9 x10E3/uL   EOS (ABSOLUTE) 0.2 0.0 - 0.4 x10E3/uL   Basophils Absolute 0.1 0.0 - 0.2 x10E3/uL   Immature Granulocytes 1 Not Estab. %   Immature Grans (Abs) 0.1 0.0 - 0.1 x10E3/uL  Comprehensive metabolic panel  Result Value Ref Range   Glucose 74 65 - 99 mg/dL   BUN 6 6 - 24 mg/dL   Creatinine, Ser 2.42 0.57 - 1.00 mg/dL   GFR calc non Af Amer 102 >59 mL/min/1.73   GFR calc Af Amer 117 >59 mL/min/1.73   BUN/Creatinine Ratio 8 (L) 9 - 23   Sodium 138 134 - 144 mmol/L   Potassium 5.4 (H) 3.5 - 5.2 mmol/L   Chloride 96 96 - 106 mmol/L   CO2 21 20 - 29 mmol/L   Calcium 9.6 8.7 - 10.2 mg/dL   Total Protein 7.3 6.0 - 8.5 g/dL   Albumin 4.7 3.8 - 4.8 g/dL   Globulin, Total 2.6 1.5 - 4.5 g/dL   Albumin/Globulin Ratio 1.8 1.2 - 2.2   Bilirubin Total 0.3 0.0 - 1.2 mg/dL   Alkaline Phosphatase 94 39 - 117 IU/L   AST 57 (H) 0 - 40 IU/L   ALT 45 (H) 0 - 32 IU/L  Lipase  Result Value Ref Range   Lipase 21 14 - 72 U/L  TSH  Result Value Ref Range   TSH 3.080 0.450 - 4.500 uIU/mL  UA/M w/rflx Culture, Routine   Specimen: Urine   URINE  Result Value Ref Range   Specific Gravity, UA 1.020 1.005 - 1.030   pH, UA 6.5 5.0 - 7.5   Color, UA Yellow Yellow   Appearance Ur Clear Clear   Leukocytes, UA Negative Negative   Protein, UA 1+ (A) Negative/Trace   Glucose, UA Negative Negative   Ketones, UA Negative Negative   RBC, UA Negative Negative   Bilirubin, UA Negative Negative   Urobilinogen, Ur 0.2 0.2 - 1.0 mg/dL   Nitrite,  UA Negative Negative   Microscopic Examination See below:    Urinalysis Reflex Comment       Assessment & Plan:   Problem List Items Addressed  This Visit       Nervous and Auditory   Left sided sciatica    Chronic. Ongoing.  Increased Gabapentin to 600mg  QID. Will need to see pain management in the future. Follow up in 3 months.       Relevant Medications   gabapentin (NEURONTIN) 600 MG tablet   amphetamine-dextroamphetamine (ADDERALL) 20 MG tablet (Start on 02/14/2021)   amphetamine-dextroamphetamine (ADDERALL) 20 MG tablet (Start on 01/14/2021)   amphetamine-dextroamphetamine (ADDERALL) 20 MG tablet (Start on 12/14/2020)     Other   ADHD - Primary    Chronic.  Controlled.  Continue with current medication regimen.  Labs ordered today.  Return to clinic in 3 months for reevaluation.  Call sooner if concerns arise.  Refills sent today. Will need UDS and controlled substance agreement at next visit.         Relevant Medications   amphetamine-dextroamphetamine (ADDERALL) 20 MG tablet (Start on 02/14/2021)   amphetamine-dextroamphetamine (ADDERALL) 20 MG tablet (Start on 01/14/2021)   amphetamine-dextroamphetamine (ADDERALL) 20 MG tablet (Start on 12/14/2020)     Follow up plan: Return in about 3 months (around 03/14/2021) for ADHD FU, possibly start saxenda.  This visit was completed via MyChart due to the restrictions of the COVID-19 pandemic. All issues as above were discussed and addressed. Physical exam was done as above through visual confirmation on MyChart. If it was felt that the patient should be evaluated in the office, they were directed there. The patient verbally consented to this visit. Location of the patient: Car Location of the provider: Home Those involved with this call:  Provider: 05/14/2021, NP CMA: Tiffany Reel, CMA Front Desk/Registration: Larae Grooms Time spent on call: 20 minutes with patient face to face via video conference. Harriet Pho total spent in review of patient's record and preparation of their chart.

## 2020-12-12 ENCOUNTER — Encounter: Payer: Self-pay | Admitting: Nurse Practitioner

## 2020-12-12 ENCOUNTER — Other Ambulatory Visit: Payer: Self-pay

## 2020-12-12 ENCOUNTER — Telehealth (INDEPENDENT_AMBULATORY_CARE_PROVIDER_SITE_OTHER): Payer: Medicaid Other | Admitting: Nurse Practitioner

## 2020-12-12 VITALS — Ht 59.0 in | Wt 156.0 lb

## 2020-12-12 DIAGNOSIS — F902 Attention-deficit hyperactivity disorder, combined type: Secondary | ICD-10-CM

## 2020-12-12 DIAGNOSIS — M5432 Sciatica, left side: Secondary | ICD-10-CM | POA: Diagnosis not present

## 2020-12-12 MED ORDER — AMPHETAMINE-DEXTROAMPHETAMINE 20 MG PO TABS: 20.0000 mg | ORAL_TABLET | Freq: Two times a day (BID) | ORAL | 0 refills | Status: DC

## 2020-12-12 MED ORDER — GABAPENTIN 600 MG PO TABS: 600.0000 mg | ORAL_TABLET | Freq: Four times a day (QID) | ORAL | 0 refills | Status: DC

## 2020-12-12 NOTE — Assessment & Plan Note (Signed)
Chronic.  Controlled.  Continue with current medication regimen.  Labs ordered today.  Return to clinic in 3 months for reevaluation.  Call sooner if concerns arise.  Refills sent today. Will need UDS and controlled substance agreement at next visit.

## 2020-12-12 NOTE — Assessment & Plan Note (Signed)
Chronic. Ongoing.  Increased Gabapentin to 600mg  QID. Will need to see pain management in the future. Follow up in 3 months.

## 2020-12-13 ENCOUNTER — Other Ambulatory Visit: Payer: Self-pay | Admitting: Nurse Practitioner

## 2020-12-13 DIAGNOSIS — G43909 Migraine, unspecified, not intractable, without status migrainosus: Secondary | ICD-10-CM

## 2020-12-16 NOTE — Progress Notes (Signed)
Pt declined to make apt at this time stated she would call to make this apt.

## 2021-01-10 ENCOUNTER — Other Ambulatory Visit: Payer: Self-pay | Admitting: Nurse Practitioner

## 2021-01-10 DIAGNOSIS — G43909 Migraine, unspecified, not intractable, without status migrainosus: Secondary | ICD-10-CM

## 2021-01-31 ENCOUNTER — Other Ambulatory Visit: Payer: Self-pay | Admitting: Nurse Practitioner

## 2021-01-31 DIAGNOSIS — M5432 Sciatica, left side: Secondary | ICD-10-CM

## 2021-01-31 NOTE — Telephone Encounter (Signed)
Requested medication (s) are due for refill today: Yes  Requested medication (s) are on the active medication list: Yes  Last refill:  12/12/20  Future visit scheduled: No  Notes to clinic:  Unable to refill per protocol, last refill by another provider.      Requested Prescriptions  Pending Prescriptions Disp Refills   pantoprazole (PROTONIX) 40 MG tablet [Pharmacy Med Name: PANTOPRAZOLE SOD DR 40 MG TAB] 180 tablet 0    Sig: Take 1 tablet (40 mg total) by mouth 2 (two) times daily before a meal.     Gastroenterology: Proton Pump Inhibitors Passed - 01/31/2021 10:01 AM      Passed - Valid encounter within last 12 months    Recent Outpatient Visits           1 month ago Attention deficit hyperactivity disorder (ADHD), combined type   [facility] Larae Grooms, NP   4 months ago Attention deficit hyperactivity disorder (ADHD), combined type   [facility] Larae Grooms, NP   7 months ago Suspected COVID-19 virus infection   [facility] [place], [place]   8 months ago Foot pain, right   [facility] Gabriel Cirri, NP   10 months ago Mild intermittent asthma with acute exacerbation   [facility] Gabriel Cirri, NP              Signed Prescriptions Disp Refills   gabapentin (NEURONTIN) 600 MG tablet 360 tablet 2    Sig: Take 1 tablet (600 mg total) by mouth in the morning, at noon, in the evening, and at bedtime.     Neurology: Anticonvulsants - gabapentin Passed - 01/31/2021 10:01 AM      Passed - Valid encounter within last 12 months    Recent Outpatient Visits           1 month ago Attention deficit hyperactivity disorder (ADHD), combined type   [facility] Larae Grooms, NP   4 months ago Attention deficit hyperactivity disorder (ADHD), combined type   [facility] Larae Grooms, NP   7 months ago Suspected COVID-19 virus infection   [facility] [place], DO   8 months ago Foot pain, right   [facility] Gabriel Cirri, NP   10 months ago Mild intermittent asthma with acute exacerbation   [facility] Gabriel Cirri, NP

## 2021-02-03 NOTE — Telephone Encounter (Signed)
Last visit was 12/10/20

## 2021-03-11 DIAGNOSIS — M5432 Sciatica, left side: Secondary | ICD-10-CM

## 2021-03-14 ENCOUNTER — Ambulatory Visit: Payer: Medicaid Other | Admitting: Nurse Practitioner

## 2021-03-14 NOTE — Progress Notes (Deleted)
There were no vitals taken for this visit.   Subjective:    Patient ID: Laurie Roman, female    DOB: 1975/07/31, 45 y.o.   MRN: [id number]  HPI: Laurie Roman is a 45 y.o. female  No chief complaint on file.  HYPERTENSION Hypertension status: {Blank single:19197::"controlled","uncontrolled","better","worse","exacerbated","stable"}  Satisfied with current treatment? {Blank single:19197::"yes","no"} Duration of hypertension: {Blank single:19197::"chronic","months","years"} BP monitoring frequency:  {Blank single:19197::"not checking","rarely","daily","weekly","monthly","a few times a day","a few times a week","a few times a month"} BP range:  BP medication side effects:  {Blank single:19197::"yes","no"} Medication compliance: {Blank single:19197::"excellent compliance","good compliance","fair compliance","poor compliance"} Previous BP meds:{Blank multiple:19196::"none","amlodipine","amlodipine/benazepril","atenolol","benazepril","benazepril/HCTZ","bisoprolol (bystolic)","carvedilol","chlorthalidone","clonidine","diltiazem","exforge HCT","HCTZ","irbesartan (avapro)","labetalol","lisinopril","lisinopril-HCTZ","losartan (cozaar)","methyldopa","nifedipine","olmesartan (benicar)","olmesartan-HCTZ","quinapril","ramipril","spironalactone","tekturna","valsartan","valsartan-HCTZ","verapamil"} Aspirin: {Blank single:19197::"yes","no"} Recurrent headaches: {Blank single:19197::"yes","no"} Visual changes: {Blank single:19197::"yes","no"} Palpitations: {Blank single:19197::"yes","no"} Dyspnea: {Blank single:19197::"yes","no"} Chest pain: {Blank single:19197::"yes","no"} Lower extremity edema: {Blank single:19197::"yes","no"} Dizzy/lightheaded: {Blank single:19197::"yes","no"}  Relevant past medical, surgical, family and social history reviewed and updated as indicated. Interim medical history since our last visit reviewed. Allergies and medications reviewed and updated.  Review of Systems  Per  HPI unless specifically indicated above     Objective:    There were no vitals taken for this visit.  Wt Readings from Last 3 Encounters:  12/12/20 156 lb (70.8 kg)  05/16/20 159 lb 9.6 oz (72.4 kg)  01/01/20 152 lb (68.9 kg)    Physical Exam  Results for orders placed or performed in visit on 08/11/18  Ova and parasite examination   Specimen: Stool   ST  Result Value Ref Range   OVA + PARASITE EXAM Final report    O&P result 1 Comment   Fecal leukocytes   Specimen: Stool   ST  Result Value Ref Range   White Blood Cells (WBC), Stool Final report None Seen   Result 1 Comment   Stool C-Diff Toxin Assay   Specimen: Stool   ST  Result Value Ref Range   C difficile Toxins A+B, EIA Negative Negative  Microscopic Examination   URINE  Result Value Ref Range   WBC, UA 0-5 0 - 5 /hpf   RBC, UA None seen 0 - 2 /hpf   Epithelial Cells (non renal) 0-10 0 - 10 /hpf   Casts Present None seen /lpf   Cast Type Hyaline casts N/A   Bacteria, UA Many (A) None seen/Few  Urine Culture, Reflex   URINE  Result Value Ref Range   Urine Culture, Routine Final report (A)    Organism ID, Bacteria Enterococcus faecalis (A)    ORGANISM ID, BACTERIA Comment    Antimicrobial Susceptibility Comment   CBC with Differential/Platelet  Result Value Ref Range   WBC 12.4 (H) 3.4 - 10.8 x10E3/uL   RBC 4.34 3.77 - 5.28 x10E6/uL   Hemoglobin 14.1 11.1 - 15.9 g/dL   Hematocrit 57.3 22.0 - 46.6 %   MCV 92 79 - 97 fL   MCH 32.5 26.6 - 33.0 pg   MCHC 35.5 31.5 - 35.7 g/dL   RDW 25.4 27.0 - 62.3 %   Platelets 288 150 - 450 x10E3/uL   Neutrophils 59 Not Estab. %   Lymphs 29 Not Estab. %   Monocytes 8 Not Estab. %   Eos 2 Not Estab. %   Basos 1 Not Estab. %   Neutrophils Absolute 7.4 (H) 1.4 - 7.0 x10E3/uL   Lymphocytes Absolute 3.6 (H) 0.7 - 3.1 x10E3/uL   Monocytes Absolute 1.0 (H) 0.1 - 0.9 x10E3/uL   EOS (ABSOLUTE) 0.2 0.0 - 0.4 x10E3/uL   Basophils Absolute 0.1 0.0 -  0.2 x10E3/uL   Immature  Granulocytes 1 Not Estab. %   Immature Grans (Abs) 0.1 0.0 - 0.1 x10E3/uL  Comprehensive metabolic panel  Result Value Ref Range   Glucose 74 65 - 99 mg/dL   BUN 6 6 - 24 mg/dL   Creatinine, Ser 2.95 0.57 - 1.00 mg/dL   GFR calc non Af Amer 102 >59 mL/min/1.73   GFR calc Af Amer 117 >59 mL/min/1.73   BUN/Creatinine Ratio 8 (L) 9 - 23   Sodium 138 134 - 144 mmol/L   Potassium 5.4 (H) 3.5 - 5.2 mmol/L   Chloride 96 96 - 106 mmol/L   CO2 21 20 - 29 mmol/L   Calcium 9.6 8.7 - 10.2 mg/dL   Total Protein 7.3 6.0 - 8.5 g/dL   Albumin 4.7 3.8 - 4.8 g/dL   Globulin, Total 2.6 1.5 - 4.5 g/dL   Albumin/Globulin Ratio 1.8 1.2 - 2.2   Bilirubin Total 0.3 0.0 - 1.2 mg/dL   Alkaline Phosphatase 94 39 - 117 IU/L   AST 57 (H) 0 - 40 IU/L   ALT 45 (H) 0 - 32 IU/L  Lipase  Result Value Ref Range   Lipase 21 14 - 72 U/L  TSH  Result Value Ref Range   TSH 3.080 0.450 - 4.500 uIU/mL  UA/M w/rflx Culture, Routine   Specimen: Urine   URINE  Result Value Ref Range   Specific Gravity, UA 1.020 1.005 - 1.030   pH, UA 6.5 5.0 - 7.5   Color, UA Yellow Yellow   Appearance Ur Clear Clear   Leukocytes, UA Negative Negative   Protein, UA 1+ (A) Negative/Trace   Glucose, UA Negative Negative   Ketones, UA Negative Negative   RBC, UA Negative Negative   Bilirubin, UA Negative Negative   Urobilinogen, Ur 0.2 0.2 - 1.0 mg/dL   Nitrite, UA Negative Negative   Microscopic Examination See below:    Urinalysis Reflex Comment       Assessment & Plan:   Problem List Items Addressed This Visit       Cardiovascular and Mediastinum   Essential hypertension - Primary     Follow up plan: No follow-ups on file.

## 2021-03-19 ENCOUNTER — Encounter: Payer: Self-pay | Admitting: Nurse Practitioner

## 2021-03-19 ENCOUNTER — Telehealth (INDEPENDENT_AMBULATORY_CARE_PROVIDER_SITE_OTHER): Payer: Medicaid Other | Admitting: Nurse Practitioner

## 2021-03-19 VITALS — BP 143/92 | HR 101 | Ht 59.0 in | Wt 156.0 lb

## 2021-03-19 DIAGNOSIS — I10 Essential (primary) hypertension: Secondary | ICD-10-CM | POA: Diagnosis not present

## 2021-03-19 DIAGNOSIS — G43909 Migraine, unspecified, not intractable, without status migrainosus: Secondary | ICD-10-CM

## 2021-03-19 DIAGNOSIS — F902 Attention-deficit hyperactivity disorder, combined type: Secondary | ICD-10-CM | POA: Diagnosis not present

## 2021-03-19 MED ORDER — AMPHETAMINE-DEXTROAMPHETAMINE 20 MG PO TABS: 20.0000 mg | ORAL_TABLET | Freq: Two times a day (BID) | ORAL | 0 refills | Status: DC

## 2021-03-19 MED ORDER — RIZATRIPTAN BENZOATE 10 MG PO TABS: 10.0000 mg | ORAL_TABLET | ORAL | 1 refills | Status: DC | PRN

## 2021-03-19 MED ORDER — AIMOVIG 70 MG/ML ~~LOC~~ SOAJ: 70.0000 mg | SUBCUTANEOUS | 3 refills | Status: DC

## 2021-03-19 MED ORDER — LOSARTAN POTASSIUM 50 MG PO TABS: 50.0000 mg | ORAL_TABLET | Freq: Every day | ORAL | 0 refills | Status: DC

## 2021-03-19 NOTE — Progress Notes (Addendum)
BP (!) 143/92   Pulse (!) 101   Ht 4\' 11"  (1.499 m)   Wt 156 lb (70.8 kg)   BMI 31.51 kg/m    Subjective:    Patient ID: , female    DOB: 30-Aug-1975, 45 y.o.   MRN: [id number]  HPI: Laurie Roman is a 45 y.o. female  Chief Complaint  Patient presents with   ADHD   ADHD FOLLOW UP ADHD status: controlled Satisfied with current therapy: yes Medication compliance:  excellent compliance Controlled substance contract: yes Previous psychiatry evaluation: no Previous medications: no adderall   Taking meds on weekends/vacations: yes Work/school performance:  excellent Difficulty sustaining attention/completing tasks:  without medication Distracted by extraneous stimuli: no Does not listen when spoken to: yes  Fidgets with hands or feet: yes Unable to stay in seat: no Blurts out/interrupts others: no ADHD Medication Side Effects: no    Decreased appetite: no    Headache: no    Sleeping disturbance pattern: yes    Irritability: no    Rebound effects (worse than baseline) off medication: no    Anxiousness: no    Dizziness: no    Tics: no  Patient states at 12-1 pm she looses concentration.  She takes a second tablet to help sustain concentration throughout the work day. Patient states she has tried taking the XR version but had trouble with insomnia.    HYPERTENSION Patient works at a [employer] and has been checking her blood pressures there.  They have been consistently above 140/90s.  She has not been taking her Losartan.   Denies HA, CP, SOB, dizziness, palpitations, visual changes, and lower extremity swelling.  Relevant past medical, surgical, family and social history reviewed and updated as indicated. Interim medical history since our last visit reviewed. Allergies and medications reviewed and updated.  Review of Systems  Constitutional:  Negative for appetite change.  Eyes:  Negative for visual disturbance.  Respiratory:  Negative for cough,  chest tightness and shortness of breath.   Cardiovascular:  Negative for chest pain, palpitations and leg swelling.  Neurological:  Negative for dizziness and headaches.  Psychiatric/Behavioral:  Positive for decreased concentration and sleep disturbance. Negative for agitation. The patient is not nervous/anxious.    Per HPI unless specifically indicated above     Objective:    BP (!) 143/92   Pulse (!) 101   Ht 4\' 11"  (1.499 m)   Wt 156 lb (70.8 kg)   BMI 31.51 kg/m   Wt Readings from Last 3 Encounters:  03/19/21 156 lb (70.8 kg)  12/12/20 156 lb (70.8 kg)  05/16/20 159 lb 9.6 oz (72.4 kg)    Physical Exam Vitals and nursing note reviewed.  Pulmonary:     Effort: Pulmonary effort is normal. No respiratory distress.  Neurological:     Mental Status: She is alert.  Psychiatric:        Mood and Affect: Mood normal.        Behavior: Behavior normal.        Thought Content: Thought content normal.        Judgment: Judgment normal.    Results for orders placed or performed in visit on 08/11/18  Ova and parasite examination   Specimen: Stool   ST  Result Value Ref Range   OVA + PARASITE EXAM Final report    O&P result 1 Comment   Fecal leukocytes   Specimen: Stool   ST  Result Value Ref Range  White Blood Cells (WBC), Stool Final report None Seen   Result 1 Comment   Stool C-Diff Toxin Assay   Specimen: Stool   ST  Result Value Ref Range   C difficile Toxins A+B, EIA Negative Negative  Microscopic Examination   URINE  Result Value Ref Range   WBC, UA 0-5 0 - 5 /hpf   RBC, UA None seen 0 - 2 /hpf   Epithelial Cells (non renal) 0-10 0 - 10 /hpf   Casts Present None seen /lpf   Cast Type Hyaline casts N/A   Bacteria, UA Many (A) None seen/Few  Urine Culture, Reflex   URINE  Result Value Ref Range   Urine Culture, Routine Final report (A)    Organism ID, Bacteria Enterococcus faecalis (A)    ORGANISM ID, BACTERIA Comment    Antimicrobial Susceptibility  Comment   CBC with Differential/Platelet  Result Value Ref Range   WBC 12.4 (H) 3.4 - 10.8 x10E3/uL   RBC 4.34 3.77 - 5.28 x10E6/uL   Hemoglobin 14.1 11.1 - 15.9 g/dL   Hematocrit 34.1 93.7 - 46.6 %   MCV 92 79 - 97 fL   MCH 32.5 26.6 - 33.0 pg   MCHC 35.5 31.5 - 35.7 g/dL   RDW 90.2 40.9 - 73.5 %   Platelets 288 150 - 450 x10E3/uL   Neutrophils 59 Not Estab. %   Lymphs 29 Not Estab. %   Monocytes 8 Not Estab. %   Eos 2 Not Estab. %   Basos 1 Not Estab. %   Neutrophils Absolute 7.4 (H) 1.4 - 7.0 x10E3/uL   Lymphocytes Absolute 3.6 (H) 0.7 - 3.1 x10E3/uL   Monocytes Absolute 1.0 (H) 0.1 - 0.9 x10E3/uL   EOS (ABSOLUTE) 0.2 0.0 - 0.4 x10E3/uL   Basophils Absolute 0.1 0.0 - 0.2 x10E3/uL   Immature Granulocytes 1 Not Estab. %   Immature Grans (Abs) 0.1 0.0 - 0.1 x10E3/uL  Comprehensive metabolic panel  Result Value Ref Range   Glucose 74 65 - 99 mg/dL   BUN 6 6 - 24 mg/dL   Creatinine, Ser 3.29 0.57 - 1.00 mg/dL   GFR calc non Af Amer 102 >59 mL/min/1.73   GFR calc Af Amer 117 >59 mL/min/1.73   BUN/Creatinine Ratio 8 (L) 9 - 23   Sodium 138 134 - 144 mmol/L   Potassium 5.4 (H) 3.5 - 5.2 mmol/L   Chloride 96 96 - 106 mmol/L   CO2 21 20 - 29 mmol/L   Calcium 9.6 8.7 - 10.2 mg/dL   Total Protein 7.3 6.0 - 8.5 g/dL   Albumin 4.7 3.8 - 4.8 g/dL   Globulin, Total 2.6 1.5 - 4.5 g/dL   Albumin/Globulin Ratio 1.8 1.2 - 2.2   Bilirubin Total 0.3 0.0 - 1.2 mg/dL   Alkaline Phosphatase 94 39 - 117 IU/L   AST 57 (H) 0 - 40 IU/L   ALT 45 (H) 0 - 32 IU/L  Lipase  Result Value Ref Range   Lipase 21 14 - 72 U/L  TSH  Result Value Ref Range   TSH 3.080 0.450 - 4.500 uIU/mL  UA/M w/rflx Culture, Routine   Specimen: Urine   URINE  Result Value Ref Range   Specific Gravity, UA 1.020 1.005 - 1.030   pH, UA 6.5 5.0 - 7.5   Color, UA Yellow Yellow   Appearance Ur Clear Clear   Leukocytes, UA Negative Negative   Protein, UA 1+ (A) Negative/Trace   Glucose, UA Negative Negative  Ketones, UA Negative Negative   RBC, UA Negative Negative   Bilirubin, UA Negative Negative   Urobilinogen, Ur 0.2 0.2 - 1.0 mg/dL   Nitrite, UA Negative Negative   Microscopic Examination See below:    Urinalysis Reflex Comment       Assessment & Plan:   Problem List Items Addressed This Visit       Cardiovascular and Mediastinum   Migraine    Chronic.  Controlled.  Continue with current medication regimen on Aimovig.  Labs at next visit.  Return to clinic in 3 months for reevaluation.  Next visit will need to be in person.  Call sooner if concerns arise.        Relevant Medications   rizatriptan (MAXALT) 10 MG tablet   Erenumab-aooe (AIMOVIG) 70 MG/ML SOAJ   losartan (COZAAR) 50 MG tablet   Essential hypertension - Primary    Chronic.  Unontrolled.  Will restart Losartan 50mg  daily.  Side effects and benefits of medication discussed with patient during visit.  Continue to check blood pressures regularly.    Labs oat next visit. Return to clinic in 1 months for reevaluation.  Call sooner if concerns arise.        Relevant Medications   losartan (COZAAR) 50 MG tablet     Other   ADHD    Chronic.  Controlled.  Continue with current medication regimen.   Patient takes 2 tabs daily due to losing concentration around 12-1 pm. She takes a second tablet to help sustain concentration throughout the work day. Patient states she has tried taking the XR version but had trouble with insomnia. Return to clinic in 3 months for reevaluation.  Call sooner if concerns arise.  Refills sent today. PDMP reviewed. Next visit will need to be in person.       Relevant Medications   amphetamine-dextroamphetamine (ADDERALL) 20 MG tablet   amphetamine-dextroamphetamine (ADDERALL) 20 MG tablet (Start on 04/19/2021)   amphetamine-dextroamphetamine (ADDERALL) 20 MG tablet (Start on 05/19/2021)     Follow up plan: Return in about 1 month (around 04/19/2021) for BP Check.   This visit was completed  via MyChart due to the restrictions of the COVID-19 pandemic. All issues as above were discussed and addressed. Physical exam was done as above through visual confirmation on MyChart. If it was felt that the patient should be evaluated in the office, they were directed there. The patient verbally consented to this visit. Location of the patient: Work Location of the provider: Office Those involved with this call:  Provider: 13/05/2021, NP CMA: Larae Grooms, CMA Front Desk/Registration: Darrol Angel This encounter was conducted via phone.  I spent 15 dedicated to the care of this patient on the date of this encounter to include previsit review of 25, time with the patient, and post visit ordering of testing.

## 2021-03-20 NOTE — Assessment & Plan Note (Signed)
Chronic.  Controlled.  Continue with current medication regimen.   Patient takes 2 tabs daily due to losing concentration around 12-1 pm. She takes a second tablet to help sustain concentration throughout the work day. Patient states she has tried taking the XR version but had trouble with insomnia. Return to clinic in 3 months for reevaluation.  Call sooner if concerns arise.  Refills sent today. PDMP reviewed. Next visit will need to be in person.

## 2021-03-20 NOTE — Assessment & Plan Note (Signed)
Chronic.  Controlled.  Continue with current medication regimen on Aimovig.  Labs at next visit.  Return to clinic in 3 months for reevaluation.  Next visit will need to be in person.  Call sooner if concerns arise.

## 2021-03-20 NOTE — Assessment & Plan Note (Signed)
Chronic.  Unontrolled.  Will restart Losartan 50mg  daily.  Side effects and benefits of medication discussed with patient during visit.  Continue to check blood pressures regularly.    Labs oat next visit. Return to clinic in 1 months for reevaluation.  Call sooner if concerns arise.

## 2021-03-20 NOTE — Addendum Note (Signed)
Addended by: Larae Grooms on: 03/20/2021 08:46 AM   Modules accepted: Level of Service

## 2021-04-09 MED ORDER — GABAPENTIN 600 MG PO TABS: 600.0000 mg | ORAL_TABLET | Freq: Four times a day (QID) | ORAL | 0 refills | Status: DC

## 2021-06-04 DIAGNOSIS — R000 Tachycardia, unspecified: Secondary | ICD-10-CM | POA: Diagnosis not present

## 2021-06-04 DIAGNOSIS — U071 COVID-19: Secondary | ICD-10-CM | POA: Diagnosis not present

## 2021-06-04 DIAGNOSIS — R0789 Other chest pain: Secondary | ICD-10-CM | POA: Diagnosis not present

## 2021-06-05 ENCOUNTER — Emergency Department (HOSPITAL_BASED_OUTPATIENT_CLINIC_OR_DEPARTMENT_OTHER): Payer: Medicaid Other | Admitting: Radiology

## 2021-06-05 ENCOUNTER — Other Ambulatory Visit: Payer: Self-pay

## 2021-06-05 ENCOUNTER — Encounter: Payer: Self-pay | Admitting: Nurse Practitioner

## 2021-06-05 ENCOUNTER — Emergency Department (HOSPITAL_BASED_OUTPATIENT_CLINIC_OR_DEPARTMENT_OTHER)
Admission: EM | Admit: 2021-06-05 | Discharge: 2021-06-06 | Disposition: A | Discharge status: Home / Self Care | Payer: Medicaid Other | Attending: Emergency Medicine | Admitting: Emergency Medicine

## 2021-06-05 ENCOUNTER — Encounter (HOSPITAL_BASED_OUTPATIENT_CLINIC_OR_DEPARTMENT_OTHER): Payer: Self-pay | Admitting: Emergency Medicine

## 2021-06-05 DIAGNOSIS — R079 Chest pain, unspecified: Secondary | ICD-10-CM

## 2021-06-05 DIAGNOSIS — J45909 Unspecified asthma, uncomplicated: Secondary | ICD-10-CM | POA: Diagnosis not present

## 2021-06-05 DIAGNOSIS — I10 Essential (primary) hypertension: Secondary | ICD-10-CM | POA: Insufficient documentation

## 2021-06-05 DIAGNOSIS — Z7951 Long term (current) use of inhaled steroids: Secondary | ICD-10-CM | POA: Diagnosis not present

## 2021-06-05 DIAGNOSIS — Z79899 Other long term (current) drug therapy: Secondary | ICD-10-CM | POA: Insufficient documentation

## 2021-06-05 DIAGNOSIS — R0789 Other chest pain: Secondary | ICD-10-CM | POA: Diagnosis not present

## 2021-06-05 LAB — CBC
HCT: 38.8 % (ref 36.0–46.0)
Hemoglobin: 13.2 g/dL (ref 12.0–15.0)
MCH: 30.6 pg (ref 26.0–34.0)
MCHC: 34 g/dL (ref 30.0–36.0)
MCV: 89.8 fL (ref 80.0–100.0)
Platelets: 357 10*3/uL (ref 150–400)
RBC: 4.32 MIL/uL (ref 3.87–5.11)
RDW: 12.6 % (ref 11.5–15.5)
WBC: 11.4 10*3/uL — ABNORMAL HIGH (ref 4.0–10.5)
nRBC: 0 % (ref 0.0–0.2)

## 2021-06-05 LAB — BASIC METABOLIC PANEL
Anion gap: 11 (ref 5–15)
BUN: 13 mg/dL (ref 6–20)
CO2: 28 mmol/L (ref 22–32)
Calcium: 10 mg/dL (ref 8.9–10.3)
Chloride: 101 mmol/L (ref 98–111)
Creatinine, Ser: 0.64 mg/dL (ref 0.44–1.00)
GFR, Estimated: >60 mL/min (ref >60)
Glucose, Bld: 103 mg/dL — ABNORMAL HIGH (ref 70–99)
Potassium: 3.2 mmol/L — ABNORMAL LOW (ref 3.5–5.1)
Sodium: 140 mmol/L (ref 135–145)

## 2021-06-05 LAB — TROPONIN I (HIGH SENSITIVITY)
Troponin I (High Sensitivity): <2 ng/L (ref <18)
Troponin I (High Sensitivity): <2 ng/L (ref <18)

## 2021-06-05 LAB — D-DIMER, QUANTITATIVE: D-Dimer, Quant: <0.27 ug/mL-FEU (ref 0.00–0.50)

## 2021-06-05 MED ORDER — MORPHINE SULFATE (PF) 4 MG/ML IV SOLN
4.0000 mg | Freq: Once | INTRAVENOUS | Status: AC
  Administered 2021-06-05: 4 mg via INTRAVENOUS
  Filled 2021-06-05: qty 1

## 2021-06-05 MED ORDER — SODIUM CHLORIDE 0.9 % IV SOLN
6.2500 mg | Freq: Four times a day (QID) | INTRAVENOUS | Status: DC | PRN
  Filled 2021-06-05: qty 0.25

## 2021-06-05 MED ORDER — SODIUM CHLORIDE 0.9 % IV BOLUS
500.0000 mL | Freq: Once | INTRAVENOUS | Status: AC
  Administered 2021-06-05: 500 mL via INTRAVENOUS

## 2021-06-05 MED ORDER — METOCLOPRAMIDE HCL 5 MG/ML IJ SOLN
5.0000 mg | Freq: Once | INTRAMUSCULAR | Status: AC
  Administered 2021-06-05: 5 mg via INTRAVENOUS
  Filled 2021-06-05: qty 2

## 2021-06-05 NOTE — ED Provider Notes (Signed)
[facility] EMERGENCY DEPT Provider Note   CSN: [id number] Arrival date & time: 06/05/21  1850     History Chief Complaint  Patient presents with   Chest Pain    Laurie Roman is a 45 y.o. female.  HPI  46 year old female with past medical history of anxiety, OSA presents emergency department with concern for PE.  Patient recently had COVID infection.  Although she is improved she is continue to have shortness of breath, intermittent cough and right-sided chest pain.  She was seen by her primary doctor and was tachycardic, they sent her here specifically to rule out a blood clot in the lungs.  No history of DVT/PE.  No swelling of her lower extremities.  No ongoing fever.  No GI symptoms.  Past Medical History:  Diagnosis Date   Anxiety    Asthma    Blood transfusion without reported diagnosis    Depression    GERD (gastroesophageal reflux disease)    IBS (irritable bowel syndrome)    Insomnia    Lumbago    Migraine    Sciatica    Sleep apnea    Tietze's disease     Patient Active Problem List   Diagnosis Date Noted   Depression, major, single episode, severe (HCC) 05/16/2020   Primary insomnia 09/15/2019   Anxiety    Maxillary sinusitis 12/02/2018   Asthma 03/27/2018   Controlled substance agreement signed 10/13/2017   Migraine 08/21/2017   ADHD 08/21/2017   Essential hypertension 08/21/2017   Mild obstructive sleep apnea 02/18/2017   Left sided sciatica 09/27/2015   Gastroesophageal reflux disease without esophagitis 05/02/2014    Past Surgical History:  Procedure Laterality Date   ABDOMINAL HYSTERECTOMY     APPENDECTOMY     CESAREAN SECTION     TONSILLECTOMY     TUBAL LIGATION       OB History   No obstetric history on file.     Family History  Problem Relation Age of Onset   Hypertension Mother    Hyperlipidemia Mother    Hypertension Father    ADD / ADHD Father    Leukemia Sister    Breast cancer Maternal Aunt    Migraines  Daughter    Hypertension Maternal Grandmother    Clotting disorder Maternal Grandfather     Social History   Tobacco Use   Smoking status: Never   Smokeless tobacco: Never  Vaping Use   Vaping Use: Every day  Substance Use Topics   Alcohol use: Not Currently    Comment: occasional   Drug use: No    Home Medications Prior to Admission medications   Medication Sig Start Date End Date Taking? Authorizing Provider  albuterol (VENTOLIN HFA) 108 (90 Base) MCG/ACT inhaler Inhale 2 puffs into the lungs every 6 (six) hours as needed for wheezing or shortness of breath. 09/09/20   Larae Grooms, NP  amphetamine-dextroamphetamine (ADDERALL) 20 MG tablet Take 1 tablet (20 mg total) by mouth 2 (two) times daily. 03/19/21   Larae Grooms, NP  amphetamine-dextroamphetamine (ADDERALL) 20 MG tablet Take 1 tablet (20 mg total) by mouth 2 (two) times daily. 04/19/21 05/19/21  Larae Grooms, NP  amphetamine-dextroamphetamine (ADDERALL) 20 MG tablet Take 1 tablet (20 mg total) by mouth 2 (two) times daily. 05/19/21 06/18/21  Larae Grooms, NP  cyclobenzaprine (FLEXERIL) 10 MG tablet Take 1 tablet (10 mg total) by mouth 3 (three) times daily as needed for muscle spasms. 10/21/20   Larae Grooms, NP  Dorise Hiss (AIMOVIG)  70 MG/ML SOAJ Inject 70 mg into the skin every 30 (thirty) days. 03/19/21   Larae Grooms, NP  fluticasone (FLONASE) 50 MCG/ACT nasal spray Place 1 spray into both nostrils daily. 09/09/20   Larae Grooms, NP  fluticasone furoate-vilanterol (BREO ELLIPTA) 200-25 MCG/INH AEPB Inhale 1 puff into the lungs daily. 10/05/19   Particia Nearing, PA-C  gabapentin (NEURONTIN) 600 MG tablet Take 1 tablet (600 mg total) by mouth in the morning, at noon, in the evening, and at bedtime. 04/09/21   Larae Grooms, NP  Glycopyrrolate-Formoterol (BEVESPI AEROSPHERE) 9-4.8 MCG/ACT AERO Inhale 2 puffs into the lungs 2 (two) times daily. 05/16/20   Gabriel Cirri, NP  ketorolac  (TORADOL) 10 MG tablet Take 1 tablet (10 mg total) by mouth every 6 (six) hours as needed. 09/09/20   Larae Grooms, NP  losartan (COZAAR) 50 MG tablet Take 1 tablet (50 mg total) by mouth daily. 03/19/21   Larae Grooms, NP  pantoprazole (PROTONIX) 40 MG tablet Take 1 tablet (40 mg total) by mouth 2 (two) times daily before a meal. 02/03/21   Larae Grooms, NP  rizatriptan (MAXALT) 10 MG tablet Take 1 tablet (10 mg total) by mouth as needed for migraine. May repeat in 2 hours if needed 03/19/21   Larae Grooms, NP    Allergies    Cefazolin, Ondansetron hcl, Emgality [galcanezumab-gnlm], Ultram [tramadol], Sulfa antibiotics, and Sulfasalazine  Review of Systems   Review of Systems  Constitutional:  Positive for fatigue. Negative for chills and fever.  HENT:  Negative for congestion.   Respiratory:  Positive for cough and shortness of breath.   Cardiovascular:  Positive for chest pain. Negative for palpitations and leg swelling.  Gastrointestinal:  Negative for abdominal pain, diarrhea and vomiting.  Genitourinary:  Negative for dysuria.  Skin:  Negative for rash.  Neurological:  Negative for headaches.   Physical Exam Updated Vital Signs BP (!) 163/115    Pulse 90    Temp 98.4 F (36.9 C)    Resp 17    Ht 4' 11.5" (1.511 m)    Wt 64.4 kg    SpO2 100%    BMI 28.20 kg/m   Physical Exam Vitals and nursing note reviewed.  Constitutional:      General: She is not in acute distress.    Appearance: Normal appearance. She is not ill-appearing.  HENT:     Head: Normocephalic.     Mouth/Throat:     Mouth: Mucous membranes are moist.  Cardiovascular:     Rate and Rhythm: Tachycardia present.  Pulmonary:     Effort: Pulmonary effort is normal. No tachypnea or respiratory distress.     Breath sounds: No decreased breath sounds.  Abdominal:     Palpations: Abdomen is soft.     Tenderness: There is no abdominal tenderness.  Musculoskeletal:     Right lower leg: No edema.      Left lower leg: No edema.  Skin:    General: Skin is warm.  Neurological:     Mental Status: She is alert and oriented to person, place, and time. Mental status is at baseline.  Psychiatric:        Mood and Affect: Mood normal.    ED Results / Procedures / Treatments   Labs (all labs ordered are listed, but only abnormal results are displayed) Labs Reviewed  BASIC METABOLIC PANEL - Abnormal; Notable for the following components:      Result Value   Potassium 3.2 (*)  Glucose, Bld 103 (*)    All other components within normal limits  CBC - Abnormal; Notable for the following components:   WBC 11.4 (*)    All other components within normal limits  D-DIMER, QUANTITATIVE  TROPONIN I (HIGH SENSITIVITY)  TROPONIN I (HIGH SENSITIVITY)    EKG EKG Interpretation  Date/Time:  Thursday June 05 2021 19:14:26 EST Ventricular Rate:  98 PR Interval:  138 QRS Duration: 82 QT Interval:  346 QTC Calculation: 441 R Axis:   75 Text Interpretation: Normal sinus rhythm Normal ECG When compared with ECG of 23-Dec-2012 19:27, No significant change was found Similar to previous Confirmed by Coralee Pesa [phone]) on 06/05/2021 10:40:36 PM  Radiology No results found.  Procedures Procedures   Medications Ordered in ED Medications  promethazine (PHENERGAN) 6.25 mg in sodium chloride 0.9 % 50 mL IVPB (has no administration in time range)  morphine 4 MG/ML injection 4 mg (4 mg Intravenous Given 06/05/21 2331)  sodium chloride 0.9 % bolus 500 mL (500 mLs Intravenous New Bag/Given 06/05/21 2330)    ED Course  I have reviewed the triage vital signs and the nursing notes.  Pertinent labs & imaging results that were available during my care of the patient were reviewed by me and considered in my medical decision making (see chart for details).    MDM Rules/Calculators/A&P                          45 year old female presents emergency department rule out PE.  Recent COVID infection  with ongoing right-sided chest pain, shortness of breath and cough.  No hemoptysis.  Was tachycardic in her primary doctor's office.  No lower extremity swelling.  Tachycardic on arrival here.  No shortness of breath or hypoxia.  She was hypertensive.  However blood pressure on my evaluation was 159/93 without any intervention.  Patient is well-appearing.  No acute distress.  Work-up is reassuring.  EKG shows no ischemic changes.  Chest x-ray unremarkable.  Troponin is negative x2, D-dimer is negative.  Plan for outpatient follow-up.  Patient at this time appears safe and stable for discharge and will be treated as an outpatient.  Discharge plan and strict return to ED precautions discussed, patient verbalizes understanding and agreement.     Final Clinical Impression(s) / ED Diagnoses Final diagnoses:  None    Rx / DC Orders ED Discharge Orders     None        Rozelle Logan, DO 06/05/21 2347

## 2021-06-05 NOTE — Telephone Encounter (Signed)
Do you want her to see you?

## 2021-06-05 NOTE — Discharge Instructions (Signed)
You have been seen and discharged from the emergency department.  Your cardiac work-up was normal, D-dimer was negative.  Follow-up with your primary provider for reevaluation and further care. Take home medications as prescribed. If you have any worsening symptoms or further concerns for your health please return to an emergency department for further evaluation.

## 2021-06-05 NOTE — ED Triage Notes (Signed)
Pt via pov from home with right sided chest pain since yesterday. Pt states she just had covid and tried to go back to work yesterday, and that the pain got worse throughout the day. Pt states that UC sent her here for evaluation of potential blood clot. Pt alert & oriented, nad noted.

## 2021-06-05 NOTE — Telephone Encounter (Signed)
Called pt to schedule an appt, unable to leave vm due to mailbox being full

## 2021-06-09 ENCOUNTER — Telehealth: Payer: Self-pay

## 2021-06-09 NOTE — Telephone Encounter (Signed)
Transition Care Management Follow-up Telephone Call Date of discharge and from where: 06/06/2021 from [facility] How have you been since you were released from the hospital? Pt stated that she is feeling better and did not have any questions or concerns at this time.  Any questions or concerns? No  Items Reviewed: Did the pt receive and understand the discharge instructions provided? Yes  Medications obtained and verified? Yes  Other? No  Any new allergies since your discharge? No  Dietary orders reviewed? No Do you have support at home? Yes   Functional Questionnaire: (I = Independent and D = Dependent) ADLs: I  Bathing/Dressing- I  Meal Prep- I  Eating- I  Maintaining continence- I  Transferring/Ambulation- I  Managing Meds- I   Follow up appointments reviewed:  PCP Hospital f/u appt confirmed? No  Pt stated that she will call PCP for follow up.  Specialist Hospital f/u appt confirmed? No   Are transportation arrangements needed? No  If their condition worsens, is the pt aware to call PCP or go to the Emergency Dept.? Yes Was the patient provided with contact information for the PCP's office or ED? Yes Was to pt encouraged to call back with questions or concerns? Yes

## 2021-06-16 ENCOUNTER — Encounter: Payer: Self-pay | Admitting: Nurse Practitioner

## 2021-06-16 ENCOUNTER — Telehealth (INDEPENDENT_AMBULATORY_CARE_PROVIDER_SITE_OTHER): Payer: Medicaid Other | Admitting: Nurse Practitioner

## 2021-06-16 DIAGNOSIS — G43909 Migraine, unspecified, not intractable, without status migrainosus: Secondary | ICD-10-CM

## 2021-06-16 DIAGNOSIS — M5432 Sciatica, left side: Secondary | ICD-10-CM

## 2021-06-16 DIAGNOSIS — F902 Attention-deficit hyperactivity disorder, combined type: Secondary | ICD-10-CM | POA: Diagnosis not present

## 2021-06-16 MED ORDER — AIMOVIG 140 MG/ML ~~LOC~~ SOAJ: 140.0000 mg | SUBCUTANEOUS | 2 refills | Status: DC

## 2021-06-16 MED ORDER — PANTOPRAZOLE SODIUM 40 MG PO TBEC: 40.0000 mg | DELAYED_RELEASE_TABLET | Freq: Every day | ORAL | 0 refills | Status: DC

## 2021-06-16 MED ORDER — METHYLPREDNISOLONE 4 MG PO TBPK: ORAL_TABLET | ORAL | 0 refills | Status: DC

## 2021-06-16 MED ORDER — GUAIFENESIN-CODEINE 100-10 MG/5ML PO SOLN
5.0000 mL | Freq: Two times a day (BID) | ORAL | 0 refills | Status: DC | PRN
Start: 2021-06-16 — End: 2021-09-11

## 2021-06-16 MED ORDER — AMPHETAMINE-DEXTROAMPHETAMINE 20 MG PO TABS: 20.0000 mg | ORAL_TABLET | Freq: Two times a day (BID) | ORAL | 0 refills | Status: DC

## 2021-06-16 MED ORDER — GABAPENTIN 800 MG PO TABS: 800.0000 mg | ORAL_TABLET | Freq: Three times a day (TID) | ORAL | 1 refills | Status: DC

## 2021-06-16 MED ORDER — LOSARTAN POTASSIUM 50 MG PO TABS: 50.0000 mg | ORAL_TABLET | Freq: Every day | ORAL | 0 refills | Status: DC

## 2021-06-16 NOTE — Progress Notes (Signed)
There were no vitals taken for this visit.   Subjective:    Patient ID: Laurie Roman, female    DOB: 1975-12-04, 46 y.o.   MRN: [id number]  HPI: Laurie Roman is a 46 y.o. female  Chief Complaint  Patient presents with   ADHD   Migraine   COVID- patient recently had COVID and had to take two weeks off due to [place].  She is still having a cough from COVID.  It is keeping her up at night.    ADHD FOLLOW UP ADHD status: controlled Satisfied with current therapy: yes Medication compliance:  excellent compliance Controlled substance contract: yes Previous psychiatry evaluation: no Previous medications: no adderall   Taking meds on weekends/vacations: yes Work/school performance:  excellent Difficulty sustaining attention/completing tasks:  without medication Distracted by extraneous stimuli: no Does not listen when spoken to: yes  Fidgets with hands or feet: yes Unable to stay in seat: no Blurts out/interrupts others: no ADHD Medication Side Effects: no    Decreased appetite: no    Headache: no    Sleeping disturbance pattern: yes    Irritability: no    Rebound effects (worse than baseline) off medication: no    Anxiousness: no    Dizziness: no    Tics: no   MIGRAINES Was seeing neurology for Aimovig 140mg .  However, she has been stable on that dose.  Per patient Neurology states that she can have it filled at PCP office.     Relevant past medical, surgical, family and social history reviewed and updated as indicated. Interim medical history since our last visit reviewed. Allergies and medications reviewed and updated.  Review of Systems  Constitutional:  Negative for appetite change.  Neurological:  Positive for headaches. Negative for dizziness.  Psychiatric/Behavioral:  Positive for decreased concentration. Negative for agitation and sleep disturbance. The patient is not nervous/anxious.    Per HPI unless specifically indicated above     Objective:    There  were no vitals taken for this visit.  Wt Readings from Last 3 Encounters:  06/05/21 142 lb (64.4 kg)  03/19/21 156 lb (70.8 kg)  12/12/20 156 lb (70.8 kg)    Physical Exam Vitals and nursing note reviewed.  Pulmonary:     Effort: Pulmonary effort is normal. No respiratory distress.  Neurological:     Mental Status: She is alert.  Psychiatric:        Mood and Affect: Mood normal.        Behavior: Behavior normal.        Thought Content: Thought content normal.        Judgment: Judgment normal.    Results for orders placed or performed during the hospital encounter of [id number]  Basic metabolic panel  Result Value Ref Range   Sodium 140 135 - 145 mmol/L   Potassium 3.2 (L) 3.5 - 5.1 mmol/L   Chloride 101 98 - 111 mmol/L   CO2 28 22 - 32 mmol/L   Glucose, Bld 103 (H) 70 - 99 mg/dL   BUN 13 6 - 20 mg/dL   Creatinine, Ser 0.64 0.44 - 1.00 mg/dL   Calcium 10.0 8.9 - 10.3 mg/dL   GFR, Estimated >60 >60 mL/min   Anion gap 11 5 - 15  CBC  Result Value Ref Range   WBC 11.4 (H) 4.0 - 10.5 K/uL   RBC 4.32 3.87 - 5.11 MIL/uL   Hemoglobin 13.2 12.0 - 15.0 g/dL   HCT 38.8 36.0 - 46.0 %  MCV 89.8 80.0 - 100.0 fL   MCH 30.6 26.0 - 34.0 pg   MCHC 34.0 30.0 - 36.0 g/dL   RDW 12.6 11.5 - 15.5 %   Platelets 357 150 - 400 K/uL   nRBC 0.0 0.0 - 0.2 %  D-dimer, quantitative  Result Value Ref Range   D-Dimer, Quant <0.27 0.00 - 0.50 ug/mL-FEU  Troponin I (High Sensitivity)  Result Value Ref Range   Troponin I (High Sensitivity) <2 <18 ng/L  Troponin I (High Sensitivity)  Result Value Ref Range   Troponin I (High Sensitivity) <2 <18 ng/L      Assessment & Plan:   Problem List Items Addressed This Visit       Cardiovascular and Mediastinum   Migraine - Primary    Chronic. Controlled with Aimovig 140mg .  She has only been getting 70mg  because she wasn't sure if we would fill the medication since previously increased by Neurology.  Will send increased dose in. Follow up in 3 months  for reevaluation.      Relevant Medications   gabapentin (NEURONTIN) 800 MG tablet   losartan (COZAAR) 50 MG tablet   Erenumab-aooe (AIMOVIG) 140 MG/ML SOAJ     Nervous and Auditory   Left sided sciatica   Relevant Medications   gabapentin (NEURONTIN) 800 MG tablet   amphetamine-dextroamphetamine (ADDERALL) 20 MG tablet (Start on 08/16/2021)   amphetamine-dextroamphetamine (ADDERALL) 20 MG tablet (Start on 07/19/2021)   amphetamine-dextroamphetamine (ADDERALL) 20 MG tablet (Start on 06/19/2021)     Other   ADHD    Chronic.  Controlled.  Continue with current medication regimen.   Patient takes 2 tabs daily due to losing concentration around 12-1 pm. She takes a second tablet to help sustain concentration throughout the work day. Patient states she has tried taking the XR version but had trouble with insomnia. Return to clinic in 3 months for reevaluation.  Call sooner if concerns arise.  Denies concerns at visit today.  Refills sent today. PDMP reviewed. Next visit will need to be in person.       Relevant Medications   amphetamine-dextroamphetamine (ADDERALL) 20 MG tablet (Start on 08/16/2021)   amphetamine-dextroamphetamine (ADDERALL) 20 MG tablet (Start on 07/19/2021)   amphetamine-dextroamphetamine (ADDERALL) 20 MG tablet (Start on 06/19/2021)     Follow up plan: Return in about 3 months (around 09/14/2021) for ADHD FU.   This visit was completed via MyChart due to the restrictions of the COVID-19 pandemic. All issues as above were discussed and addressed. Physical exam was done as above through visual confirmation on MyChart. If it was felt that the patient should be evaluated in the office, they were directed there. The patient verbally consented to this visit. Location of the patient: Work Location of the provider: Office Those involved with this call:  Provider: Jon Billings, NP CMA: Yvonna Alanis, [place] Desk/Registration: Myrlene Broker This encounter was conducted  via phone.  I spent 20 dedicated to the care of this patient on the date of this encounter to include previsit review of 30, phone time with the patient, and post visit ordering of testing.

## 2021-06-17 ENCOUNTER — Telehealth: Payer: Self-pay | Admitting: Nurse Practitioner

## 2021-06-17 NOTE — Assessment & Plan Note (Signed)
Chronic. Controlled with Aimovig 140mg .  She has only been getting 70mg  because she wasn't sure if we would fill the medication since previously increased by Neurology.  Will send increased dose in. Follow up in 3 months for reevaluation.

## 2021-06-17 NOTE — Assessment & Plan Note (Signed)
Chronic.  Controlled.  Continue with current medication regimen.   Patient takes 2 tabs daily due to losing concentration around 12-1 pm. She takes a second tablet to help sustain concentration throughout the work day. Patient states she has tried taking the XR version but had trouble with insomnia. Return to clinic in 3 months for reevaluation.  Call sooner if concerns arise.  Denies concerns at visit today.  Refills sent today. PDMP reviewed. Next visit will need to be in person.

## 2021-06-17 NOTE — Telephone Encounter (Signed)
Unable to leave vm to let pt know to call back to schedule a 3 month in office follow up visit with Clydie Braun.

## 2021-06-17 NOTE — Progress Notes (Signed)
Unable to leave vm to schedule appt °

## 2021-06-18 NOTE — Progress Notes (Signed)
2nd attempt- Unable to leave vm to schedule appt due to full mailbox.

## 2021-06-19 NOTE — Progress Notes (Signed)
3rd attempt- Unable to leave vm to schedule follow up

## 2021-09-10 ENCOUNTER — Encounter: Payer: Self-pay | Admitting: Nurse Practitioner

## 2021-09-11 ENCOUNTER — Telehealth (INDEPENDENT_AMBULATORY_CARE_PROVIDER_SITE_OTHER): Payer: Medicaid Other | Admitting: Nurse Practitioner

## 2021-09-11 ENCOUNTER — Encounter: Payer: Self-pay | Admitting: Nurse Practitioner

## 2021-09-11 VITALS — BP 166/116

## 2021-09-11 DIAGNOSIS — F322 Major depressive disorder, single episode, severe without psychotic features: Secondary | ICD-10-CM

## 2021-09-11 DIAGNOSIS — Z0289 Encounter for other administrative examinations: Secondary | ICD-10-CM | POA: Diagnosis not present

## 2021-09-11 MED ORDER — ARIPIPRAZOLE 2 MG PO TABS: 2.0000 mg | ORAL_TABLET | Freq: Every day | ORAL | 0 refills | Status: DC

## 2021-09-11 NOTE — Telephone Encounter (Signed)
Pt scheduled today @ 1 pm virtually ?

## 2021-09-11 NOTE — Progress Notes (Signed)
? ?BP (!) 166/116   ? ?Subjective:  ? ? Patient ID: Laurie Roman, female    DOB: [birth year], 46 y.o.   MRN: [id number] ? ?HPI: ?Laurie Roman is a 46 y.o. female ? ?Chief Complaint  ?Patient presents with  ? Depression  ? Anxiety  ? ?DEPRESSION/ANXIETY ?Patient states she has been putting on a front to cover her stress and anxiety.  Lately she has been having panic attacks on the way to work in the morning. Patient states people around her are noticing that she is not doing well.  She is feeling depressed.  She is nervous to take any antidepressants.  Has taken them in the past and they made her have suicidal thoughts. Not currently having them. She has tried Paxil, Zoloft, Wellbutrin, and Seroquel. ? ?Flowsheet Row Video Visit from 09/11/2021 in [facility]  ?PHQ-9 Total Score 18  ? ?  ? ? ?  09/11/2021  ?  1:11 PM 01/01/2020  ?  3:52 PM 06/16/2019  ?  9:06 AM  ?GAD 7 : Generalized Anxiety Score  ?Nervous, Anxious, on Edge 2 2 3   ?Control/stop worrying 3 3 3   ?Worry too much - different things 3 3 3   ?Trouble relaxing 3 3 3   ?Restless 3 2 3   ?Easily annoyed or irritable 3 3 3   ?Afraid - awful might happen 0 3 3  ?Total GAD 7 Score 17 19 21   ?Anxiety Difficulty Extremely difficult Extremely difficult Extremely difficult  ? ? ? ? ?Relevant past medical, surgical, family and social history reviewed and updated as indicated. Interim medical history since our last visit reviewed. ?Allergies and medications reviewed and updated. ? ?Review of Systems  ?Psychiatric/Behavioral:  Positive for dysphoric mood. The patient is nervous/anxious.   ? ?Per HPI unless specifically indicated above ? ?   ?Objective:  ?  ?BP (!) 166/116   ?Wt Readings from Last 3 Encounters:  ?06/05/21 142 lb (64.4 kg)  ?03/19/21 156 lb (70.8 kg)  ?12/12/20 156 lb (70.8 kg)  ?  ?Physical Exam ?Vitals and nursing note reviewed.  ?HENT:  ?   Head: Normocephalic.  ?   Right Ear: Hearing normal.  ?   Left Ear: Hearing normal.  ?   Nose: Nose normal.   ?Eyes:  ?   Pupils: Pupils are equal, round, and reactive to light.  ?Pulmonary:  ?   Effort: Pulmonary effort is normal. No respiratory distress.  ?Neurological:  ?   Mental Status: She is alert.  ?Psychiatric:     ?   Mood and Affect: Mood normal.     ?   Behavior: Behavior normal.     ?   Thought Content: Thought content normal.     ?   Judgment: Judgment normal.  ? ? ?Results for orders placed or performed during the hospital encounter of 06/05/21  ?Basic metabolic panel  ?Result Value Ref Range  ? Sodium 140 135 - 145 mmol/L  ? Potassium 3.2 (L) 3.5 - 5.1 mmol/L  ? Chloride 101 98 - 111 mmol/L  ? CO2 28 22 - 32 mmol/L  ? Glucose, Bld 103 (H) 70 - 99 mg/dL  ? BUN 13 6 - 20 mg/dL  ? Creatinine, Ser 0.64 0.44 - 1.00 mg/dL  ? Calcium 10.0 8.9 - 10.3 mg/dL  ? GFR, Estimated >60 >60 mL/min  ? Anion gap 11 5 - 15  ?CBC  ?Result Value Ref Range  ? WBC 11.4 (H) 4.0 - 10.5 K/uL  ?  RBC 4.32 3.87 - 5.11 MIL/uL  ? Hemoglobin 13.2 12.0 - 15.0 g/dL  ? HCT 38.8 36.0 - 46.0 %  ? MCV 89.8 80.0 - 100.0 fL  ? MCH 30.6 26.0 - 34.0 pg  ? MCHC 34.0 30.0 - 36.0 g/dL  ? RDW 12.6 11.5 - 15.5 %  ? Platelets 357 150 - 400 K/uL  ? nRBC 0.0 0.0 - 0.2 %  ?D-dimer, quantitative  ?Result Value Ref Range  ? D-Dimer, Quant <0.27 0.00 - 0.50 ug/mL-FEU  ?Troponin I (High Sensitivity)  ?Result Value Ref Range  ? Troponin I (High Sensitivity) <2 <18 ng/L  ?Troponin I (High Sensitivity)  ?Result Value Ref Range  ? Troponin I (High Sensitivity) <2 <18 ng/L  ? ?   ?Assessment & Plan:  ? ?Problem List Items Addressed This Visit   ? ?  ? Other  ? Depression, major, single episode, severe (HCC) - Primary  ?  Chronic.  Uncontrolled.  Has been on several medications in the past.  SSRIs and SNRIs have caused SI in the past.  Has tried antipsychotics which did not cause her to have SI but she has abdominal pain.  Will try Abilify 2mg  daily.  Discussed side effects and benefits of medication during visit.  Return to clinic in 2 weeks for reevaluation.  Call  sooner if concerns arise.  ? ?  ?  ? ?Other Visit Diagnoses   ? ? Encounter for completion of form with patient      ? Patient is sending forms for FMLA.   ? ?  ?  ? ?Follow up plan: ?Return in about 2 weeks (around 09/25/2021) for Depression/Anxiety FU. ? ? ?This visit was completed via MyChart due to the restrictions of the COVID-19 pandemic. All issues as above were discussed and addressed. Physical exam was done as above through visual confirmation on MyChart. If it was felt that the patient should be evaluated in the office, they were directed there. The patient verbally consented to this visit. ?Location of the patient: Work ?Location of the provider: Office ?Those involved with this call:  ?Provider: 09/27/2021, NP ?CMA: Larae Grooms, CMA ?Front Desk/Registration: Wilhemena Durie ?This encounter was conducted via video.  I spent 30 dedicated to the care of this patient on the date of this encounter to include previsit review of symptoms, plan of care and paperwork, face to face time with the patient, and post visit ordering of testing.  ? ? ? ? ?

## 2021-09-11 NOTE — Assessment & Plan Note (Signed)
Chronic.  Uncontrolled.  Has been on several medications in the past.  SSRIs and SNRIs have caused SI in the past.  Has tried antipsychotics which did not cause her to have SI but she has abdominal pain.  Will try Abilify 2mg  daily.  Discussed side effects and benefits of medication during visit.  Return to clinic in 2 weeks for reevaluation.  Call sooner if concerns arise.  ? ?

## 2021-09-12 ENCOUNTER — Telehealth: Payer: Self-pay

## 2021-09-12 NOTE — Telephone Encounter (Signed)
PA approved and patient notified via Mychart.  ?

## 2021-09-12 NOTE — Telephone Encounter (Signed)
PA for Abilify initiated and submitted via Cover My Meds. Key: [id number] ?

## 2021-09-15 NOTE — Progress Notes (Signed)
Pt states she will call back to schedule once she looks at her vacation time ?

## 2021-09-17 ENCOUNTER — Other Ambulatory Visit: Payer: Self-pay | Admitting: Nurse Practitioner

## 2021-09-17 DIAGNOSIS — G43909 Migraine, unspecified, not intractable, without status migrainosus: Secondary | ICD-10-CM

## 2021-09-18 ENCOUNTER — Other Ambulatory Visit: Payer: Self-pay | Admitting: Nurse Practitioner

## 2021-09-18 ENCOUNTER — Other Ambulatory Visit: Payer: Self-pay | Admitting: Family Medicine

## 2021-09-18 ENCOUNTER — Encounter: Payer: Self-pay | Admitting: Nurse Practitioner

## 2021-09-18 DIAGNOSIS — F902 Attention-deficit hyperactivity disorder, combined type: Secondary | ICD-10-CM

## 2021-09-18 MED ORDER — AMPHETAMINE-DEXTROAMPHETAMINE 20 MG PO TABS: 20.0000 mg | ORAL_TABLET | Freq: Two times a day (BID) | ORAL | 0 refills | Status: DC

## 2021-09-18 NOTE — Telephone Encounter (Signed)
Requested Prescriptions  ?Pending Prescriptions Disp Refills  ?? losartan (COZAAR) 50 MG tablet [Pharmacy Med Name: LOSARTAN POTASSIUM 50 MG TAB] 90 tablet 0  ?  Sig: Take 1 tablet (50 mg total) by mouth daily.  ?  ? Cardiovascular:  Angiotensin Receptor Blockers Failed - 09/17/2021  6:52 PM  ?  ?  Failed - K in normal range and within 180 days  ?  Potassium  ?Date Value Ref Range Status  ?06/05/2021 3.2 (L) 3.5 - 5.1 mmol/L Final  ?07/21/2011 4.5 3.5 - 5.1 mmol/L Final  ?   ?  ?  Failed - Last BP in normal range  ?  BP Readings from Last 1 Encounters:  ?09/11/21 (!) 166/116  ?   ?  ?  Passed - Cr in normal range and within 180 days  ?  Creatinine  ?Date Value Ref Range Status  ?07/21/2011 0.60 0.60 - 1.30 mg/dL Final  ? ?Creatinine, Ser  ?Date Value Ref Range Status  ?06/05/2021 0.64 0.44 - 1.00 mg/dL Final  ?   ?  ?  Passed - Patient is not pregnant  ?  ?  Passed - Valid encounter within last 6 months  ?  Recent Outpatient Visits   ?      ? 1 week ago Depression, major, single episode, severe ([place])  ? [place], NP  ? 3 months ago Migraine without status migrainosus, not intractable, unspecified migraine type  ? [place], NP  ? 6 months ago Essential hypertension  ? [place], NP  ? 9 months ago Attention deficit hyperactivity disorder (ADHD), combined type  ? [place], NP  ? 1 year ago Attention deficit hyperactivity disorder (ADHD), combined type  ? [place], NP  ?  ?  ? ?  ?  ?  ?? AIMOVIG 140 MG/ML SOAJ [Pharmacy Med Name: AIMOVIG 140 MG/ML AUTOINJECTOR] 1 mL 0  ?  Sig: Inject 140 mg into the skin every 30 (thirty) days.  ?  ? Off-Protocol Failed - 09/17/2021  6:52 PM  ?  ?  Failed - Medication not assigned to a protocol, review manually.  ?  ?  Passed - Valid encounter within last 12 months  ?  Recent Outpatient Visits   ?      ? 1 week ago  Depression, major, single episode, severe ([place])  ? [place], NP  ? 3 months ago Migraine without status migrainosus, not intractable, unspecified migraine type  ? [place], NP  ? 6 months ago Essential hypertension  ? [place], NP  ? 9 months ago Attention deficit hyperactivity disorder (ADHD), combined type  ? [place], NP  ? 1 year ago Attention deficit hyperactivity disorder (ADHD), combined type  ? [facility] Jon Billings, NP  ?  ?  ? ?  ?  ? Off-Protocol Failed - 09/17/2021  6:52 PM  ?  ?  Failed - Medication not assigned to a protocol, review manually.  ?  ?  Passed - Valid encounter within last 12 months  ?  Recent Outpatient Visits   ?      ? 1 week ago Depression, major, single episode, severe ([place])  ? [place], NP  ? 3 months ago Migraine without status migrainosus, not intractable, unspecified migraine type  ? [place]  Jon Billings, NP  ? 6 months ago Essential hypertension  ? [place], NP  ? 9 months ago Attention deficit hyperactivity disorder (ADHD), combined type  ? [place], NP  ? 1 year ago Attention deficit hyperactivity disorder (ADHD), combined type  ? [facility] Jon Billings, NP  ?  ?  ? ?  ?  ?  ? ? ?

## 2021-09-18 NOTE — Telephone Encounter (Signed)
Requested Prescriptions  ?Pending Prescriptions Disp Refills  ?? rizatriptan (MAXALT) 10 MG tablet [Pharmacy Med Name: RIZATRIPTAN 10 MG TABLET] 10 tablet 0  ?  Sig: Take 1 tablet (10 mg total) by mouth as needed for migraine. May repeat in 2 hours if needed  ?  ? Neurology:  Migraine Therapy - Triptan Failed - 09/17/2021  6:53 PM  ?  ?  Failed - Last BP in normal range  ?  BP Readings from Last 1 Encounters:  ?09/11/21 (!) 166/116  ?   ?  ?  Passed - Valid encounter within last 12 months  ?  Recent Outpatient Visits   ?      ? 1 week ago Depression, major, single episode, severe ([place])  ? [place], NP  ? 3 months ago Migraine without status migrainosus, not intractable, unspecified migraine type  ? [place], NP  ? 6 months ago Essential hypertension  ? [place], NP  ? 9 months ago Attention deficit hyperactivity disorder (ADHD), combined type  ? [place], NP  ? 1 year ago Attention deficit hyperactivity disorder (ADHD), combined type  ? [facility] Jon Billings, NP  ?  ?  ? ?  ?  ?  ? ? ?

## 2021-09-24 ENCOUNTER — Ambulatory Visit: Payer: Self-pay | Admitting: *Deleted

## 2021-09-24 ENCOUNTER — Other Ambulatory Visit: Payer: Self-pay | Admitting: Nurse Practitioner

## 2021-09-24 DIAGNOSIS — F322 Major depressive disorder, single episode, severe without psychotic features: Secondary | ICD-10-CM

## 2021-09-24 NOTE — Telephone Encounter (Signed)
Called patient to give her Karen's recommendations. Unable to leave a message for patient due to voicemail box being full.  ? ?OK for PEC/Nurse Triage to give note if patient calls back.  ?

## 2021-09-24 NOTE — Addendum Note (Signed)
Addended by: Larae Grooms on: 09/24/2021 09:36 AM ? ? Modules accepted: Orders ? ?

## 2021-09-24 NOTE — Telephone Encounter (Signed)
Requested Prescriptions  ?Pending Prescriptions Disp Refills  ?? fluticasone (FLONASE) 50 MCG/ACT nasal spray [Pharmacy Med Name: FLUTICASONE PROPIONATE NS] 16 g 0  ?  Sig: Place 1 spray into both nostrils daily.  ?  ? Not Delegated - Ear, Nose, and Throat: Nasal Preparations - Corticosteroids Failed - 09/24/2021 11:30 AM  ?  ?  Failed - This refill cannot be delegated  ?  ?  Passed - Valid encounter within last 12 months  ?  Recent Outpatient Visits   ?      ? 1 week ago Depression, major, single episode, severe (HCC)  ? [facility] Larae Grooms, NP  ? 3 months ago Migraine without status migrainosus, not intractable, unspecified migraine type  ? [facility] Larae Grooms, NP  ? 6 months ago Essential hypertension  ? [facility] Larae Grooms, NP  ? 9 months ago Attention deficit hyperactivity disorder (ADHD), combined type  ? [facility] Larae Grooms, NP  ? 1 year ago Attention deficit hyperactivity disorder (ADHD), combined type  ? [facility] Larae Grooms, NP  ?  ?  ? ?  ?  ?  ?? albuterol (VENTOLIN HFA) 108 (90 Base) MCG/ACT inhaler [Pharmacy Med Name: ALBUTEROL HFA 90 MCG INHALER] 18 g 0  ?  Sig: Inhale 2 puffs into the lungs every 6 (six) hours as needed for wheezing or shortness of breath.  ?  ? Pulmonology:  Beta Agonists 2 Failed - 09/24/2021 11:30 AM  ?  ?  Failed - Last BP in normal range  ?  BP Readings from Last 1 Encounters:  ?09/11/21 (!) 166/116  ?   ?  ?  Passed - Last Heart Rate in normal range  ?  Pulse Readings from Last 1 Encounters:  ?06/06/21 88  ?   ?  ?  Passed - Valid encounter within last 12 months  ?  Recent Outpatient Visits   ?      ? 1 week ago Depression, major, single episode, severe (HCC)  ? [facility] Larae Grooms, NP  ? 3 months ago Migraine without status migrainosus, not intractable, unspecified migraine type  ? [facility] Larae Grooms, NP  ? 6 months ago  Essential hypertension  ? [facility] Larae Grooms, NP  ? 9 months ago Attention deficit hyperactivity disorder (ADHD), combined type  ? [facility] Larae Grooms, NP  ? 1 year ago Attention deficit hyperactivity disorder (ADHD), combined type  ? [facility] Larae Grooms, NP  ?  ?  ? ?  ?  ?  ?? BREO ELLIPTA 200-25 MCG/ACT AEPB [Pharmacy Med Name: BREO ELLIPTA 200-25 MCG INH] 60 each 0  ?  Sig: Inhale 1 puff into the lungs daily.  ?  ? There is no refill protocol information for this order  ?  ? ? ?

## 2021-09-24 NOTE — Telephone Encounter (Signed)
Patient needs to see psychiatry.  I have exhausted the medication options I am able to provide.  I have placed the referral and would like her to see [facility]. ?

## 2021-09-24 NOTE — Telephone Encounter (Signed)
Reason for Disposition ? [1] Caller has URGENT medicine question about med that PCP or specialist prescribed AND [2] triager unable to answer question ? ?Answer Assessment - Initial Assessment Questions ?1. CONCERN: "What happened that made you call today?" ?    Depression-  ?2. DEPRESSION SYMPTOM SCREENING: "How are you feeling overall?" (e.g., decreased energy, increased sleeping or difficulty sleeping, difficulty concentrating, feelings of sadness, guilt, hopelessness, or worthlessness) ?    Stress- house in foreclosure ?3. RISK OF HARM - SUICIDAL IDEATION:  "Do you ever have thoughts of hurting or killing yourself?"  (e.g., yes, no, no but preoccupation with thoughts about death) ?  - INTENT:  "Do you have thoughts of hurting or killing yourself right NOW?" (e.g., yes, no, N/A) ?  - PLAN: "Do you have a specific plan for how you would do this?" (e.g., gun, knife, overdose, no plan, N/A) ?    No ?4. RISK OF HARM - HOMICIDAL IDEATION:  "Do you ever have thoughts of hurting or killing someone else?"  (e.g., yes, no, no but preoccupation with thoughts about death) ?  - INTENT:  "Do you have thoughts of hurting or killing someone right NOW?" (e.g., yes, no, N/A) ?  - PLAN: "Do you have a specific plan for how you would do this?" (e.g., gun, knife, no plan, N/A)  ?    no ?5. FUNCTIONAL IMPAIRMENT: "How have things been going for you overall? Have you had more difficulty than usual doing your normal daily activities?"  (e.g., better, same, worse; self-care, school, work, interactions) ?    Notices at work- rubbing hands, missed work due to medication SE ?6. SUPPORT: "Who is with you now?" "Who do you live with?" "Do you have family or friends who you can talk to?"  ?     friends ?7. THERAPIST: "Do you have a counselor or therapist? Name?" ?    no ?8. STRESSORS: "Has there been any new stress or recent changes in your life?" ?    House foreclosure ? ?Answer Assessment - Initial Assessment Questions ?1. NAME of  MEDICATION: "What medicine are you calling about?" ?    Abilify ?2. QUESTION: "What is your question?" (e.g., double dose of medicine, side effect) ?    SE from  ?3. PRESCRIBING HCP: "Who prescribed it?" Reason: if prescribed by specialist, call should be referred to that group. ?    PCP ?4. SYMPTOMS: "Do you have any symptoms?" ?    Insomnia- could not sleep first night, tried over the week end- could not sleep ?Feels very agitated,anxious, fidgets ?Patient is having panic attacks- gets very anxious ?5. SEVERITY: If symptoms are present, ask "Are they mild, moderate or severe?" ?    Severe- patient can not take medication ? ?Patient is requesting different medication for her anxiety- she can not take the Abilify- it causes severe insomnia- makes patient agitated. Patient states she would like to try something different like Ativan or something similar. Please let her know- [phone]. Offered appointment- but patient states she has missed work and is late today due to insomnia and anxiety- patient states she may be able to do a virtual if needed- and can schedule her follow up if she starts new medication. ? ?Protocols used: Depression-A-AH, Medication Question Call-A-AH ? ?

## 2021-09-24 NOTE — Telephone Encounter (Signed)
?  Chief Complaint: Medication SE-Abilify ?Symptoms: insomnia and agitation ?Frequency: has tried medication 2 times with same SE ?Pertinent Negatives: Patient denies thoughts of self harm ?Disposition: [] ED /[] Urgent Care (no appt availability in office) / [] Appointment(In office/virtual)/ []  [facility]/ [] Home Care/ [] Refused Recommended Disposition /[] [place] [facility]/ []  Follow-up with PCP ?Additional Notes:  See notes  ?

## 2021-09-24 NOTE — Telephone Encounter (Signed)
Requested medication (s) are due for refill today: yes ? ?Requested medication (s) are on the active medication list: yes ? ?Last refill:  Flonase 09/09/20 18g with 2 RF, Breo Elipta 10/05/2019 60 with 1 RF, ordered by Roosvelt Maser ? ?Future visit scheduled: no, was just seen 09/11/21 ? ?Notes to clinic:  Flonase is not delegated and the [facility] was written by provider not in this practice, please assess. ? ? ?  ? ?Requested Prescriptions  ?Pending Prescriptions Disp Refills  ? fluticasone (FLONASE) 50 MCG/ACT nasal spray [Pharmacy Med Name: FLUTICASONE PROPIONATE NS] 16 g 0  ?  Sig: Place 1 spray into both nostrils daily.  ?  ? Not Delegated - Ear, Nose, and Throat: Nasal Preparations - Corticosteroids Failed - 09/24/2021 11:30 AM  ?  ?  Failed - This refill cannot be delegated  ?  ?  Passed - Valid encounter within last 12 months  ?  Recent Outpatient Visits   ? ?      ? 1 week ago Depression, major, single episode, severe (HCC)  ? [facility] Larae Grooms, NP  ? 3 months ago Migraine without status migrainosus, not intractable, unspecified migraine type  ? [facility] Larae Grooms, NP  ? 6 months ago Essential hypertension  ? [facility] Larae Grooms, NP  ? 9 months ago Attention deficit hyperactivity disorder (ADHD), combined type  ? [facility] Larae Grooms, NP  ? 1 year ago Attention deficit hyperactivity disorder (ADHD), combined type  ? [facility] Larae Grooms, NP  ? ?  ?  ? ? ?  ?  ?  ? BREO ELLIPTA 200-25 MCG/ACT AEPB [Pharmacy Med Name: BREO ELLIPTA 200-25 MCG INH] 60 each 0  ?  Sig: Inhale 1 puff into the lungs daily.  ?  ? There is no refill protocol information for this order  ?  ?Signed Prescriptions Disp Refills  ? albuterol (VENTOLIN HFA) 108 (90 Base) MCG/ACT inhaler 18 g 0  ?  Sig: Inhale 2 puffs into the lungs every 6 (six) hours as needed for wheezing or shortness of breath.  ?  ? Pulmonology:  Beta Agonists 2  Failed - 09/24/2021 11:30 AM  ?  ?  Failed - Last BP in normal range  ?  BP Readings from Last 1 Encounters:  ?09/11/21 (!) 166/116  ?  ?  ?  ?  Passed - Last Heart Rate in normal range  ?  Pulse Readings from Last 1 Encounters:  ?06/06/21 88  ?  ?  ?  ?  Passed - Valid encounter within last 12 months  ?  Recent Outpatient Visits   ? ?      ? 1 week ago Depression, major, single episode, severe (HCC)  ? [facility] Larae Grooms, NP  ? 3 months ago Migraine without status migrainosus, not intractable, unspecified migraine type  ? [facility] Larae Grooms, NP  ? 6 months ago Essential hypertension  ? [facility] Larae Grooms, NP  ? 9 months ago Attention deficit hyperactivity disorder (ADHD), combined type  ? [facility] Larae Grooms, NP  ? 1 year ago Attention deficit hyperactivity disorder (ADHD), combined type  ? [facility] Larae Grooms, NP  ? ?  ?  ? ? ?  ?  ?  ? ? ?

## 2021-09-25 ENCOUNTER — Telehealth: Payer: Self-pay

## 2021-09-25 NOTE — Telephone Encounter (Signed)
Prior Authorization began for [employer]. ? ?Key: [id number] ?

## 2021-09-26 ENCOUNTER — Telehealth: Payer: Self-pay

## 2021-09-26 MED ORDER — BUDESONIDE-FORMOTEROL FUMARATE 160-4.5 MCG/ACT IN AERO: 2.0000 | INHALATION_SPRAY | Freq: Two times a day (BID) | RESPIRATORY_TRACT | 3 refills | Status: DC

## 2021-09-26 NOTE — Telephone Encounter (Signed)
Medication changed to Symbicort.   ?

## 2021-09-26 NOTE — Telephone Encounter (Signed)
Called pt regarding PA denial of Breo Ellpita and rx change to Symbicort. Pt voiced understanding.  ?

## 2021-09-26 NOTE — Telephone Encounter (Signed)
Attempted to contact patient to give provider advise, no answer unable to leave voicemail. ?

## 2021-09-29 NOTE — Telephone Encounter (Signed)
Attempted to contact patient today, no answer unable to LVM. [place] for [employer] to give patient provider message if patient calls back.  ?

## 2021-10-14 ENCOUNTER — Other Ambulatory Visit: Payer: Self-pay | Admitting: Nurse Practitioner

## 2021-10-14 ENCOUNTER — Telehealth: Payer: Self-pay

## 2021-10-14 ENCOUNTER — Other Ambulatory Visit: Payer: Self-pay | Admitting: Family Medicine

## 2021-10-14 DIAGNOSIS — F902 Attention-deficit hyperactivity disorder, combined type: Secondary | ICD-10-CM

## 2021-10-14 NOTE — Telephone Encounter (Signed)
PA for Aimovig has been initiated through covermymeds. Awaiting determination.  ? ?Key: [id number] ?

## 2021-10-14 NOTE — Telephone Encounter (Signed)
PA has been approved for Aimovig. Attempted to call pt but she told me to hold on and call back later because she was "on another line". Will try again later.  ?

## 2021-10-15 ENCOUNTER — Telehealth: Payer: Self-pay | Admitting: Nurse Practitioner

## 2021-10-15 DIAGNOSIS — F902 Attention-deficit hyperactivity disorder, combined type: Secondary | ICD-10-CM

## 2021-10-15 MED ORDER — FLUTICASONE PROPIONATE 50 MCG/ACT NA SUSP: 1.0000 | Freq: Every day | NASAL | 0 refills | Status: DC

## 2021-10-15 NOTE — Telephone Encounter (Signed)
Requested medication (s) are due for refill today: yes ? ?Requested medication (s) are on the active medication list: yes ? ?Last refill:  09/18/21 #60/0 ? ?Future visit scheduled: yes ? ?Notes to clinic:  Unable to refill per protocol, cannot delegate. ? ? ? ?  ?Requested Prescriptions  ?Pending Prescriptions Disp Refills  ? amphetamine-dextroamphetamine (ADDERALL) 20 MG tablet [Pharmacy Med Name: DEXTROAMP-AMPHETAMIN 20 MG TAB] 60 tablet 0  ?  Sig: Take 1 tablet (20 mg total) by mouth 2 (two) times daily.  ?  ? Not Delegated - Psychiatry:  Stimulants/ADHD Failed - 10/15/2021 10:29 AM  ?  ?  Failed - This refill cannot be delegated  ?  ?  Failed - Urine Drug Screen completed in last 360 days  ?  ?  Failed - Last BP in normal range  ?  BP Readings from Last 1 Encounters:  ?09/11/21 (!) 166/116  ?  ?  ?  ?  Passed - Last Heart Rate in normal range  ?  Pulse Readings from Last 1 Encounters:  ?06/06/21 88  ?  ?  ?  ?  Passed - Valid encounter within last 6 months  ?  Recent Outpatient Visits   ? ?      ? 1 month ago Depression, major, single episode, severe (HCC)  ? [facility] Larae Grooms, NP  ? 4 months ago Migraine without status migrainosus, not intractable, unspecified migraine type  ? [facility] Larae Grooms, NP  ? 7 months ago Essential hypertension  ? [facility] Larae Grooms, NP  ? 10 months ago Attention deficit hyperactivity disorder (ADHD), combined type  ? [facility] Larae Grooms, NP  ? 1 year ago Attention deficit hyperactivity disorder (ADHD), combined type  ? [facility] Larae Grooms, NP  ? ?  ?  ?Future Appointments   ? ?        ? In 2 days Larae Grooms, NP [facility]  ? ?  ? ? ?  ?  ?  ? ?

## 2021-10-17 ENCOUNTER — Telehealth (INDEPENDENT_AMBULATORY_CARE_PROVIDER_SITE_OTHER): Payer: Medicaid Other | Admitting: Nurse Practitioner

## 2021-10-17 ENCOUNTER — Encounter: Payer: Self-pay | Admitting: Nurse Practitioner

## 2021-10-17 VITALS — BP 183/106 | HR 87 | Wt 138.0 lb

## 2021-10-17 DIAGNOSIS — I10 Essential (primary) hypertension: Secondary | ICD-10-CM | POA: Diagnosis not present

## 2021-10-17 DIAGNOSIS — F322 Major depressive disorder, single episode, severe without psychotic features: Secondary | ICD-10-CM | POA: Diagnosis not present

## 2021-10-17 DIAGNOSIS — F902 Attention-deficit hyperactivity disorder, combined type: Secondary | ICD-10-CM

## 2021-10-17 DIAGNOSIS — F419 Anxiety disorder, unspecified: Secondary | ICD-10-CM | POA: Diagnosis not present

## 2021-10-17 NOTE — Progress Notes (Signed)
? ?BP (!) 183/106 Comment: pt reported  Pulse 87 Comment: pt reports  Wt 138 lb (62.6 kg)   SpO2 98% Comment: pt reports  BMI 27.41 kg/m?   ? ?Subjective:  ? ? Patient ID: Laurie Roman, female    DOB: 05/16/1976, 46 y.o.   MRN: [id number] ? ?HPI: ?Laurie Roman is a 46 y.o. female ? ?Chief Complaint  ?Patient presents with  ? Depression  ?  Refill, pt needs refill on adderall.   ? ?ADHD FOLLOW UP ?ADHD status: controlled ?Satisfied with current therapy: yes ?Medication compliance:  excellent compliance ?Controlled substance contract: yes ?Previous psychiatry evaluation: no ?Previous medications: no adderall   ?Taking meds on weekends/vacations: yes ?Work/school performance:  excellent ?Difficulty sustaining attention/completing tasks:  without medication ?Distracted by extraneous stimuli: no ?Does not listen when spoken to: yes  ?Fidgets with hands or feet: yes ?Unable to stay in seat: no ?Blurts out/interrupts others: no ?ADHD Medication Side Effects: no ?   Decreased appetite: no ?   Headache: no ?   Sleeping disturbance pattern: yes ?   Irritability: no ?   Rebound effects (worse than baseline) off medication: no ?   Anxiousness: no ?   Dizziness: no ?   Tics: no ? ?Patient states at 12-1 pm she looses concentration.  She takes a second tablet to help sustain concentration throughout the work day. Patient states she has tried taking the XR version but had trouble with insomnia. Denies concerns with her ADHD medications at visit today.  ? ?DEPRESSION/ANXIETY ?Patient states she couldn't take the Abilify.  It kept her up for two days straight.  States her stress is a little but less right now.  States she did not know that she needed to see psychiatry.  Would like ativan or something like that to control her symptoms. Does not want to see [facility]. ? ? ?Flowsheet Row Video Visit from 09/11/2021 in [facility]  ?PHQ-9 Total Score 18  ? ?  ? ? ? ?  09/11/2021  ?  1:11 PM 01/01/2020  ?   3:52 PM 06/16/2019  ?  9:06 AM  ?GAD 7 : Generalized Anxiety Score  ?Nervous, Anxious, on Edge 2 2 3   ?Control/stop worrying 3 3 3   ?Worry too much - different things 3 3 3   ?Trouble relaxing 3 3 3   ?Restless 3 2 3   ?Easily annoyed or irritable 3 3 3   ?Afraid - awful might happen 0 3 3  ?Total GAD 7 Score 17 19 21   ?Anxiety Difficulty Extremely difficult Extremely difficult Extremely difficult  ? ? ? ? ?Relevant past medical, surgical, family and social history reviewed and updated as indicated. Interim medical history since our last visit reviewed. ?Allergies and medications reviewed and updated. ? ?Review of Systems  ?Constitutional:  Negative for appetite change.  ?Eyes:  Negative for visual disturbance.  ?Respiratory:  Negative for cough, chest tightness and shortness of breath.   ?Cardiovascular:  Negative for chest pain, palpitations and leg swelling.  ?Neurological:  Negative for dizziness and headaches.  ?Psychiatric/Behavioral:  Positive for decreased concentration, dysphoric mood and sleep disturbance. Negative for agitation and suicidal ideas. The patient is nervous/anxious.   ? ?Per HPI unless specifically indicated above ? ?   ?Objective:  ?  ?BP (!) 183/106 Comment: pt reported  Pulse 87 Comment: pt reports  Wt 138 lb (62.6 kg)   SpO2 98% Comment: pt reports  BMI 27.41 kg/m?   ?Wt Readings from Last  3 Encounters:  ?10/17/21 138 lb (62.6 kg)  ?06/05/21 142 lb (64.4 kg)  ?03/19/21 156 lb (70.8 kg)  ?  ?Physical Exam ?Vitals and nursing note reviewed.  ?Pulmonary:  ?   Effort: Pulmonary effort is normal. No respiratory distress.  ?Neurological:  ?   Mental Status: She is alert.  ?Psychiatric:     ?   Mood and Affect: Mood normal.     ?   Behavior: Behavior normal.     ?   Thought Content: Thought content normal.     ?   Judgment: Judgment normal.  ? ? ?Results for orders placed or performed during the hospital encounter of 06/05/21  ?Basic metabolic panel  ?Result Value Ref Range  ? Sodium 140 135 - 145  mmol/L  ? Potassium 3.2 (L) 3.5 - 5.1 mmol/L  ? Chloride 101 98 - 111 mmol/L  ? CO2 28 22 - 32 mmol/L  ? Glucose, Bld 103 (H) 70 - 99 mg/dL  ? BUN 13 6 - 20 mg/dL  ? Creatinine, Ser 0.64 0.44 - 1.00 mg/dL  ? Calcium 10.0 8.9 - 10.3 mg/dL  ? GFR, Estimated >60 >60 mL/min  ? Anion gap 11 5 - 15  ?CBC  ?Result Value Ref Range  ? WBC 11.4 (H) 4.0 - 10.5 K/uL  ? RBC 4.32 3.87 - 5.11 MIL/uL  ? Hemoglobin 13.2 12.0 - 15.0 g/dL  ? HCT 38.8 36.0 - 46.0 %  ? MCV 89.8 80.0 - 100.0 fL  ? MCH 30.6 26.0 - 34.0 pg  ? MCHC 34.0 30.0 - 36.0 g/dL  ? RDW 12.6 11.5 - 15.5 %  ? Platelets 357 150 - 400 K/uL  ? nRBC 0.0 0.0 - 0.2 %  ?D-dimer, quantitative  ?Result Value Ref Range  ? D-Dimer, Quant <0.27 0.00 - 0.50 ug/mL-FEU  ?Troponin I (High Sensitivity)  ?Result Value Ref Range  ? Troponin I (High Sensitivity) <2 <18 ng/L  ?Troponin I (High Sensitivity)  ?Result Value Ref Range  ? Troponin I (High Sensitivity) <2 <18 ng/L  ? ?   ?Assessment & Plan:  ? ?Problem List Items Addressed This Visit   ? ?  ? Cardiovascular and Mediastinum  ? Essential hypertension  ?  Chronic. Has been elevated per patient.  Discussed with her that she must come in to the office in 1 month for reevaluation and lab work.   ? ?  ?  ?  ? Other  ? ADHD - Primary  ?  Chronic.  Controlled.  Continue with current medication regimen.   Patient takes 2 tabs daily due to losing concentration around 12-1 pm. She takes a second tablet to help sustain concentration throughout the work day. Patient states she has tried taking the XR version but had trouble with insomnia. Return to clinic in 1 month for reevaluation.  Will not send refills if patient does not come into the office in 1 month.  Patient was made aware during our visit today.  Refills sent today. PDMP reviewed. Next visit will need to be in person. ? ? ?  ?  ? Anxiety  ?  Chronic. Improved.  Patient has not seen psychiatry.  Does not want to see [facility] or [facility] [employer].  Feels like her mood has  improved from the last visit.  Will work with referral coordinator to find patient possibly psychiatry office in [place] where she works.  ? ?  ?  ? Depression, major, single episode, severe (HCC)  ?  Chronic. Improved.  Patient has not seen psychiatry.  Does not want to see [facility] or [facility].  Feels like her mood has improved from the last visit.  Will work with referral coordinator to find patient possibly psychiatry office in [place] where she works.  ? ?  ?  ?  ? ?Follow up plan: ?Return in about 1 month (around 11/17/2021) for HTN, HLD, DM2 FU, BP Check, Depression/Anxiety FU. ? ? ?This visit was completed via MyChart due to the restrictions of the COVID-19 pandemic. All issues as above were discussed and addressed. Physical exam was done as above through visual confirmation on MyChart. If it was felt that the patient should be evaluated in the office, they were directed there. The patient verbally consented to this visit. ?Location of the patient: Work ?Location of the provider: Office ?Those involved with this call:  ?Provider: Larae GroomsKaren Bianca Vester, NP ?CMA: Servando SnareIris Parrish, CMA ?Front Desk/Registration: Servando SnareIris Parrish ?This encounter was conducted via video.  I spent 30 dedicated to the care of this patient on the date of this encounter to include previsit review of medications, symptoms, importance of follow up and plan moving forward, time with the patient, and post visit ordering of testing.  ? ? ? ? ? ?

## 2021-10-17 NOTE — Telephone Encounter (Signed)
Pt called, states that she completed her appt today and is still without her Rx. Wants PCP to call this in today  ?

## 2021-10-20 MED ORDER — AMPHETAMINE-DEXTROAMPHETAMINE 20 MG PO TABS: 20.0000 mg | ORAL_TABLET | Freq: Two times a day (BID) | ORAL | 0 refills | Status: DC

## 2021-10-20 NOTE — Assessment & Plan Note (Signed)
Chronic. Improved.  Patient has not seen psychiatry.  Does not want to see [facility] or [employer].  Feels like her mood has improved from the last visit.  Will work with referral coordinator to find patient possibly psychiatry office in [place] where she works.  ?

## 2021-10-20 NOTE — Addendum Note (Signed)
Addended by: Larae Grooms on: 10/20/2021 08:47 AM ? ? Modules accepted: Orders ? ?

## 2021-10-20 NOTE — Assessment & Plan Note (Signed)
Chronic.  Controlled.  Continue with current medication regimen.   Patient takes 2 tabs daily due to losing concentration around 12-1 pm. She takes a second tablet to help sustain concentration throughout the work day. Patient states she has tried taking the XR version but had trouble with insomnia. Return to clinic in 1 month for reevaluation.  Will not send refills if patient does not come into the office in 1 month.  Patient was made aware during our visit today.  Refills sent today. PDMP reviewed. Next visit will need to be in person. ? ?

## 2021-10-20 NOTE — Assessment & Plan Note (Signed)
Chronic. Has been elevated per patient.  Discussed with her that she must come in to the office in 1 month for reevaluation and lab work.   ?

## 2021-10-20 NOTE — Progress Notes (Signed)
Patient states she will call back to schedule an appointment 

## 2021-10-20 NOTE — Telephone Encounter (Signed)
Medication was sent in   

## 2021-10-20 NOTE — Assessment & Plan Note (Signed)
Chronic. Improved.  Patient has not seen psychiatry.  Does not want to see [facility] or [facility].  Feels like her mood has improved from the last visit.  Will work with referral coordinator to find patient possibly psychiatry office in [place] where she works.  ?

## 2021-12-11 ENCOUNTER — Encounter: Payer: Self-pay | Admitting: Nurse Practitioner

## 2021-12-16 ENCOUNTER — Other Ambulatory Visit: Payer: Self-pay | Admitting: Nurse Practitioner

## 2021-12-16 DIAGNOSIS — M5432 Sciatica, left side: Secondary | ICD-10-CM

## 2021-12-17 NOTE — Telephone Encounter (Signed)
Discontinued 09/11/21 Requested Prescriptions  Pending Prescriptions Disp Refills  . gabapentin (NEURONTIN) 800 MG tablet [Pharmacy Med Name: GABAPENTIN 800 MG TABLET] 270 tablet 0    Sig: Take 1 tablet (800 mg total) by mouth 3 (three) times daily.     Neurology: Anticonvulsants - gabapentin Passed - 12/16/2021  6:25 PM      Passed - Cr in normal range and within 360 days    Creatinine  Date Value Ref Range Status  07/21/2011 0.60 0.60 - 1.30 mg/dL Final   Creatinine, Ser  Date Value Ref Range Status  06/05/2021 0.64 0.44 - 1.00 mg/dL Final         Passed - Completed PHQ-2 or PHQ-9 in the last 360 days      Passed - Valid encounter within last 12 months    Recent Outpatient Visits          2 months ago Attention deficit hyperactivity disorder (ADHD), combined type   [facility] Larae Grooms, NP   3 months ago Depression, major, single episode, severe (HCC)   [facility] Larae Grooms, NP   6 months ago Migraine without status migrainosus, not intractable, unspecified migraine type   [facility] Larae Grooms, NP   9 months ago Essential hypertension   [facility] Larae Grooms, NP   1 year ago Attention deficit hyperactivity disorder (ADHD), combined type   [facility] Larae Grooms, NP

## 2022-03-27 ENCOUNTER — Ambulatory Visit (INDEPENDENT_AMBULATORY_CARE_PROVIDER_SITE_OTHER): Payer: Medicaid Other | Admitting: Family Medicine

## 2022-03-27 ENCOUNTER — Telehealth: Payer: Self-pay | Admitting: Family Medicine

## 2022-03-27 ENCOUNTER — Encounter: Payer: Self-pay | Admitting: *Deleted

## 2022-03-27 ENCOUNTER — Encounter: Payer: Self-pay | Admitting: Family Medicine

## 2022-03-27 VITALS — BP 160/98 | HR 99 | Temp 98.2°F | Ht 59.0 in | Wt 140.2 lb

## 2022-03-27 DIAGNOSIS — M545 Low back pain, unspecified: Secondary | ICD-10-CM | POA: Diagnosis not present

## 2022-03-27 DIAGNOSIS — N3000 Acute cystitis without hematuria: Secondary | ICD-10-CM | POA: Diagnosis not present

## 2022-03-27 DIAGNOSIS — I10 Essential (primary) hypertension: Secondary | ICD-10-CM | POA: Diagnosis not present

## 2022-03-27 DIAGNOSIS — M542 Cervicalgia: Secondary | ICD-10-CM

## 2022-03-27 LAB — POCT URINALYSIS DIPSTICK
Bilirubin, UA: NEGATIVE
Blood, UA: NEGATIVE
Glucose, UA: NEGATIVE
Ketones, UA: NEGATIVE
Nitrite, UA: POSITIVE
Protein, UA: NEGATIVE
Spec Grav, UA: 1.02 (ref 1.010–1.025)
Urobilinogen, UA: 0.2 E.U./dL
pH, UA: 6 (ref 5.0–8.0)

## 2022-03-27 MED ORDER — LOSARTAN POTASSIUM 100 MG PO TABS: 100.0000 mg | ORAL_TABLET | Freq: Every day | ORAL | 0 refills | Status: DC

## 2022-03-27 MED ORDER — GABAPENTIN 600 MG PO TABS: 1200.0000 mg | ORAL_TABLET | Freq: Two times a day (BID) | ORAL | 0 refills | Status: DC

## 2022-03-27 MED ORDER — AMOXICILLIN-POT CLAVULANATE 875-125 MG PO TABS: 1.0000 | ORAL_TABLET | Freq: Two times a day (BID) | ORAL | 0 refills | Status: DC

## 2022-03-27 MED ORDER — HYDROCODONE-ACETAMINOPHEN 10-325 MG PO TABS: 1.0000 | ORAL_TABLET | Freq: Three times a day (TID) | ORAL | 0 refills | Status: DC | PRN

## 2022-03-27 NOTE — Progress Notes (Signed)
New Patient Office Visit  Subjective:  Patient ID: Laurie Roman, female    DOB: Aug 14, 1975  Age: 46 y.o. MRN: [id number]  CC:  Chief Complaint  Patient presents with   Back Pain    Lower back pain that started 2 days ago, getting worse     HPI Laurie Roman presents for new pt.  LBP Pt works at lab.  Has PCP in [place], but it is 2:30 on a Friday.  Happily worked her in 2 days LBP that is worsening.  R sided-h/o kidney stone. No dysuria.  + urgency/frequency/nocturia.  No f/c.  Feeling cold.  No abd pain.  Taking ibu a lot.  Took mom's vicodin last pm d/t pain   HTN-not controlled.  Taking losartan 50mg -bp's not controlled long time.  Chronic neck pain-taking gabapentin 1200bid but out.  Would like refilled  Past Medical History:  Diagnosis Date   ADD (attention deficit disorder)    Anxiety    Asthma    Blood transfusion without reported diagnosis    Depression    GERD (gastroesophageal reflux disease)    IBS (irritable bowel syndrome)    Insomnia    Lumbago    Migraine    Sciatica    Sleep apnea    Tietze's disease     Past Surgical History:  Procedure Laterality Date   APPENDECTOMY     CESAREAN SECTION     TONSILLECTOMY     TOTAL ABDOMINAL HYSTERECTOMY W/ BILATERAL SALPINGOOPHORECTOMY     TUBAL LIGATION      Family History  Problem Relation Age of Onset   Hypertension Mother    Hyperlipidemia Mother    Hypertension Father    ADD / ADHD Father    Leukemia Sister    Breast cancer Maternal Aunt    Migraines Daughter    Hypertension Maternal Grandmother    Clotting disorder Maternal Grandfather     Social History   Socioeconomic History   Marital status: Widowed    Spouse name: Not on file   Number of children: 3   Years of education: Not on file   Highest education level: Not on file  Occupational History   Not on file  Tobacco Use   Smoking status: Never   Smokeless tobacco: Never  Vaping Use   Vaping Use: Every day  Substance and Sexual  Activity   Alcohol use: Not Currently    Comment: occasional   Drug use: No   Sexual activity: Yes  Other Topics Concern   Not on file  Social History Narrative   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   Social Determinants of Health   Financial Resource Strain: Not on file  Food Insecurity: Not on file  Transportation Needs: Not on file  Physical Activity: Not on file  Stress: Not on file  Social Connections: Not on file  Intimate Partner Violence: Not on file    ROS  ROS: Gen: no fever, chills   Objective:   Today's Vitals: BP (!) 160/98   Pulse 99   Temp 98.2 F (36.8 C) (Temporal)   Ht 4\' 11"  (1.499 m)   Wt 140 lb 4 oz (63.6 kg)   SpO2 98%   BMI 28.33 kg/m   Physical Exam  Gen: WDWN NAD wf HEENT: NCAT, conjunctiva not injected, sclera nonicteric NECK:  supple, no thyromegaly, no nodes, no carotid bruits CARDIAC: tachy RRR, S1S2+, no murmur.  LUNGS: CTAB. No wheezes ABDOMEN:  BS+, soft,tender mostly R side.,  No HSM, no masses. +R CVAT EXT:  no edema MSK: no gross abnormalities.  NEURO: A&O x3.  CN II-XII intact.  PSYCH: normal mood. Good eye contact   Results for orders placed or performed in visit on 03/27/22  POCT urinalysis dipstick  Result Value Ref Range   Color, UA YELLOW    Clarity, UA CLOUDY    Glucose, UA Negative Negative   Bilirubin, UA NEG    Ketones, UA NEG    Spec Grav, UA 1.020 1.010 - 1.025   Blood, UA NEG    pH, UA 6.0 5.0 - 8.0   Protein, UA Negative Negative   Urobilinogen, UA 0.2 0.2 or 1.0 E.U./dL   Nitrite, UA POS    Leukocytes, UA Small (1+) (A) Negative   Appearance     Odor       Assessment & Plan:   Problem List Items Addressed This Visit       Cardiovascular and Mediastinum   Essential hypertension   Relevant Medications   losartan (COZAAR) 100 MG tablet     Other   Cervicalgia   Other Visit Diagnoses     Acute cystitis without hematuria    -  Primary   Relevant Orders   Urine Culture   Acute low back pain,  unspecified back pain laterality, unspecified whether sciatica present       Relevant Medications   HYDROcodone-acetaminophen (NORCO) 10-325 MG tablet   Other Relevant Orders   POCT urinalysis dipstick (Completed)      UTI-augmentin 875 bid, has azo.  Vicodin 10mg  q 6hrs prn pain(poss kidney stone but doubt).  Declines ct for now.  If worse, ER.  Drink plenty of fluids.  Check cx.  Pdmp checked LBP-prob UTI but could be kidney stone or just spasms-ibu otc.  Vicodin if necessary.   HTN-chronic.  Out of control.  Increase cozaar to 100mg    f/u 2 wk Cervicalgia-chronic.  Out of gabapentin.  Restart 1200mg  bid-aware not to start that high yet.    Outpatient Encounter Medications as of 03/27/2022  Medication Sig   AIMOVIG 140 MG/ML SOAJ Inject 140 mg into the skin every 30 (thirty) days.   albuterol (VENTOLIN HFA) 108 (90 Base) MCG/ACT inhaler Inhale 2 puffs into the lungs every 6 (six) hours as needed for wheezing or shortness of breath.   amoxicillin-clavulanate (AUGMENTIN) 875-125 MG tablet Take 1 tablet by mouth 2 (two) times daily.   HYDROcodone-acetaminophen (NORCO) 10-325 MG tablet Take 1 tablet by mouth every 8 (eight) hours as needed for up to 5 days.   pantoprazole (PROTONIX) 40 MG tablet Take 1 tablet (40 mg total) by mouth daily.   rizatriptan (MAXALT) 10 MG tablet Take 1 tablet (10 mg total) by mouth as needed for migraine. May repeat in 2 hours if needed   [DISCONTINUED] losartan (COZAAR) 50 MG tablet Take 1 tablet (50 mg total) by mouth daily.   amphetamine-dextroamphetamine (ADDERALL) 20 MG tablet Take 1 tablet (20 mg total) by mouth 2 (two) times daily.   budesonide-formoterol (SYMBICORT) 160-4.5 MCG/ACT inhaler Inhale 2 puffs into the lungs 2 (two) times daily. (Patient not taking: Reported on 03/27/2022)   fluticasone (FLONASE) 50 MCG/ACT nasal spray Place 1 spray into both nostrils daily. (Patient not taking: Reported on 03/27/2022)   gabapentin (NEURONTIN) 600 MG tablet Take 2  tablets (1,200 mg total) by mouth 2 (two) times daily.   losartan (COZAAR) 100 MG tablet Take 1 tablet (100 mg total) by mouth daily.   [  DISCONTINUED] gabapentin (NEURONTIN) 600 MG tablet Take 1,200 mg by mouth 2 (two) times daily. (Patient not taking: Reported on 03/27/2022)   No facility-administered encounter medications on file as of 03/27/2022.    Follow-up: Return in about 2 weeks (around 04/10/2022) for HTN.   Wellington Hampshire, MD

## 2022-03-27 NOTE — Patient Instructions (Addendum)
Welcome to [facility]! It was a pleasure meeting you today.  As discussed, Please schedule a 2 wk follow up visit today.  Incrase the losartan to 100mg   If worse, ER  PLEASE NOTE:  If you had any LAB tests please let us know if you have not heard back within a few days. You may see your results on MyChart before we have a chance to review them but we will give you a call once they are reviewed by [place]. If we ordered any REFERRALS today, please let us know if you have not heard from their office within the next week.  Let us know through MyChart if you are needing REFILLS, or have your pharmacy send [place] the request. You can also use MyChart to communicate with me or any office staff.  Please try these tips to maintain a healthy lifestyle:  Eat most of your calories during the day when you are active. Eliminate processed foods including packaged sweets (pies, cakes, cookies), reduce intake of potatoes, white bread, white pasta, and white rice. Look for whole grain options, oat flour or almond flour.  Each meal should contain half fruits/vegetables, one quarter protein, and one quarter carbs (no bigger than a computer mouse).  Cut down on sweet beverages. This includes juice, soda, and sweet tea. Also watch fruit intake, though this is a healthier sweet option, it still contains natural sugar! Limit to 3 servings daily.  Drink at least 1 glass of water with each meal and aim for at least 8 glasses per day  Exercise at least 150 minutes every week.

## 2022-03-30 ENCOUNTER — Other Ambulatory Visit: Payer: Self-pay | Admitting: *Deleted

## 2022-03-30 ENCOUNTER — Other Ambulatory Visit (HOSPITAL_BASED_OUTPATIENT_CLINIC_OR_DEPARTMENT_OTHER): Payer: Self-pay

## 2022-03-30 ENCOUNTER — Other Ambulatory Visit: Payer: Self-pay | Admitting: Family Medicine

## 2022-03-30 LAB — URINE CULTURE
MICRO NUMBER:: 14079929
SPECIMEN QUALITY:: ADEQUATE

## 2022-03-30 MED ORDER — HYDROCODONE-ACETAMINOPHEN 10-325 MG PO TABS
1.0000 | ORAL_TABLET | Freq: Three times a day (TID) | ORAL | 0 refills | Status: DC | PRN
  Filled 2022-03-30: qty 10, 5d supply, fill #0

## 2022-03-30 MED ORDER — LEVOFLOXACIN 250 MG PO TABS
250.0000 mg | ORAL_TABLET | Freq: Every day | ORAL | 0 refills | Status: AC
  Filled 2022-03-30: qty 3, 3d supply, fill #0

## 2022-03-31 ENCOUNTER — Other Ambulatory Visit (HOSPITAL_BASED_OUTPATIENT_CLINIC_OR_DEPARTMENT_OTHER): Payer: Self-pay

## 2022-04-06 ENCOUNTER — Other Ambulatory Visit (HOSPITAL_BASED_OUTPATIENT_CLINIC_OR_DEPARTMENT_OTHER): Payer: Self-pay

## 2022-04-06 ENCOUNTER — Encounter (HOSPITAL_BASED_OUTPATIENT_CLINIC_OR_DEPARTMENT_OTHER): Payer: Self-pay

## 2022-04-06 ENCOUNTER — Ambulatory Visit (HOSPITAL_BASED_OUTPATIENT_CLINIC_OR_DEPARTMENT_OTHER)
Admission: RE | Admit: 2022-04-06 | Discharge: 2022-04-06 | Disposition: A | Discharge status: Home / Self Care | Payer: Medicaid Other | Source: Ambulatory Visit | Attending: Family Medicine | Admitting: Family Medicine

## 2022-04-06 ENCOUNTER — Telehealth: Payer: Self-pay | Admitting: Family Medicine

## 2022-04-06 ENCOUNTER — Other Ambulatory Visit: Payer: Self-pay | Admitting: Family Medicine

## 2022-04-06 DIAGNOSIS — R1084 Generalized abdominal pain: Secondary | ICD-10-CM | POA: Diagnosis present

## 2022-04-06 DIAGNOSIS — R109 Unspecified abdominal pain: Secondary | ICD-10-CM

## 2022-04-06 DIAGNOSIS — R3129 Other microscopic hematuria: Secondary | ICD-10-CM

## 2022-04-06 DIAGNOSIS — N390 Urinary tract infection, site not specified: Secondary | ICD-10-CM | POA: Diagnosis not present

## 2022-04-06 MED ORDER — TAMSULOSIN HCL 0.4 MG PO CAPS
0.4000 mg | ORAL_CAPSULE | Freq: Every day | ORAL | 3 refills | Status: DC
  Filled 2022-04-06: qty 30, 30d supply, fill #0

## 2022-04-06 MED ORDER — CIPROFLOXACIN HCL 500 MG PO TABS
500.0000 mg | ORAL_TABLET | Freq: Two times a day (BID) | ORAL | 0 refills | Status: AC
  Filled 2022-04-06: qty 6, 3d supply, fill #0

## 2022-04-06 MED ORDER — HYDROCODONE-ACETAMINOPHEN 10-325 MG PO TABS
1.0000 | ORAL_TABLET | Freq: Three times a day (TID) | ORAL | 0 refills | Status: DC | PRN
  Filled 2022-04-06: qty 15, 5d supply, fill #0

## 2022-04-06 NOTE — Progress Notes (Signed)
Incrased R back pain, constant urination, moving down.  H/o stones.  Nausea, sweats.     R back, TTP.  Some suprapubic tenderness  Will get stat Ct for stones.   Flomax, hydro.     04/08/22-still having a lot of R lower back pain.   Has been on 6 days of appropriate abx   no urinary complaints.  CT neg.  +TTP and spasms muscles R lower back.    Hydro not working.  Will do medrol dose pk and percocet

## 2022-04-06 NOTE — Telephone Encounter (Signed)
Dani with [facility] states CT Scan Abdomen & Pelvis w/o contrast requires PA

## 2022-04-06 NOTE — Telephone Encounter (Signed)
Please see message below

## 2022-04-07 ENCOUNTER — Ambulatory Visit (INDEPENDENT_AMBULATORY_CARE_PROVIDER_SITE_OTHER): Payer: Medicaid Other

## 2022-04-07 DIAGNOSIS — Z23 Encounter for immunization: Secondary | ICD-10-CM

## 2022-04-07 NOTE — Progress Notes (Signed)
Pt here for Flu shot. Injection given in Right deltoid. Pt tolerated well.

## 2022-04-08 ENCOUNTER — Other Ambulatory Visit (HOSPITAL_BASED_OUTPATIENT_CLINIC_OR_DEPARTMENT_OTHER): Payer: Self-pay

## 2022-04-08 MED ORDER — OXYCODONE-ACETAMINOPHEN 10-325 MG PO TABS
1.0000 | ORAL_TABLET | Freq: Three times a day (TID) | ORAL | 0 refills | Status: AC | PRN
  Filled 2022-04-08: qty 12, 5d supply, fill #0

## 2022-04-08 MED ORDER — METHYLPREDNISOLONE 4 MG PO TBPK
ORAL_TABLET | ORAL | 0 refills | Status: DC
  Filled 2022-04-08: qty 21, 6d supply, fill #0

## 2022-04-08 NOTE — Addendum Note (Signed)
Addended by: Wellington Hampshire on: 04/08/2022 05:30 PM   Modules accepted: Orders

## 2022-04-10 ENCOUNTER — Other Ambulatory Visit (HOSPITAL_BASED_OUTPATIENT_CLINIC_OR_DEPARTMENT_OTHER): Payer: Self-pay

## 2022-04-10 ENCOUNTER — Encounter: Payer: Self-pay | Admitting: Family Medicine

## 2022-04-10 ENCOUNTER — Ambulatory Visit (INDEPENDENT_AMBULATORY_CARE_PROVIDER_SITE_OTHER): Payer: Medicaid Other | Admitting: Family Medicine

## 2022-04-10 VITALS — BP 102/68 | HR 101 | Temp 98.3°F | Ht 59.0 in | Wt 144.5 lb

## 2022-04-10 DIAGNOSIS — D649 Anemia, unspecified: Secondary | ICD-10-CM | POA: Diagnosis not present

## 2022-04-10 DIAGNOSIS — F902 Attention-deficit hyperactivity disorder, combined type: Secondary | ICD-10-CM

## 2022-04-10 DIAGNOSIS — M5432 Sciatica, left side: Secondary | ICD-10-CM | POA: Diagnosis not present

## 2022-04-10 DIAGNOSIS — F5101 Primary insomnia: Secondary | ICD-10-CM

## 2022-04-10 DIAGNOSIS — I10 Essential (primary) hypertension: Secondary | ICD-10-CM | POA: Diagnosis not present

## 2022-04-10 DIAGNOSIS — G2581 Restless legs syndrome: Secondary | ICD-10-CM | POA: Diagnosis not present

## 2022-04-10 MED ORDER — AIMOVIG 140 MG/ML ~~LOC~~ SOAJ
140.0000 mg | SUBCUTANEOUS | 2 refills | Status: DC
  Filled 2022-04-10: qty 1, 30d supply, fill #0

## 2022-04-10 MED ORDER — PRAZOSIN HCL 1 MG PO CAPS
1.0000 mg | ORAL_CAPSULE | Freq: Every day | ORAL | 2 refills | Status: DC
  Filled 2022-04-10: qty 30, 30d supply, fill #0

## 2022-04-10 MED ORDER — FLUCONAZOLE 150 MG PO TABS
150.0000 mg | ORAL_TABLET | ORAL | 0 refills | Status: DC | PRN
  Filled 2022-04-10: qty 2, 6d supply, fill #0

## 2022-04-10 MED ORDER — TIZANIDINE HCL 4 MG PO TABS
4.0000 mg | ORAL_TABLET | Freq: Four times a day (QID) | ORAL | 3 refills | Status: DC | PRN
  Filled 2022-04-10: qty 30, 8d supply, fill #0

## 2022-04-10 MED ORDER — AMPHETAMINE-DEXTROAMPHETAMINE 20 MG PO TABS
20.0000 mg | ORAL_TABLET | Freq: Two times a day (BID) | ORAL | 0 refills | Status: DC
  Filled 2022-04-10: qty 60, 30d supply, fill #0

## 2022-04-10 NOTE — Patient Instructions (Signed)
It was very nice to see you today!  Prazosin at bed.      PLEASE NOTE:  If you had any lab tests please let us know if you have not heard back within a few days. You may see your results on MyChart before we have a chance to review them but we will give you a call once they are reviewed by [place]. If we ordered any referrals today, please let us know if you have not heard from their office within the next week.   Please try these tips to maintain a healthy lifestyle:  Eat most of your calories during the day when you are active. Eliminate processed foods including packaged sweets (pies, cakes, cookies), reduce intake of potatoes, white bread, white pasta, and white rice. Look for whole grain options, oat flour or almond flour.  Each meal should contain half fruits/vegetables, one quarter protein, and one quarter carbs (no bigger than a computer mouse).  Cut down on sweet beverages. This includes juice, soda, and sweet tea. Also watch fruit intake, though this is a healthier sweet option, it still contains natural sugar! Limit to 3 servings daily.  Drink at least 1 glass of water with each meal and aim for at least 8 glasses per day  Exercise at least 150 minutes every week.

## 2022-04-10 NOTE — Progress Notes (Unsigned)
Subjective:     Patient ID: Laurie Roman, female    DOB: [birth year], 46 y.o.   MRN: [id number]  Chief Complaint  Patient presents with   Follow-up    2 week follow-up on HTN Discuss medication refills Discuss weight loss medication     HPI  HTN-Pt is on losartan 100mg  .  Bp's running 130's/80's  occ higher.  No cp/palp/edema/sob.  Occ dizziness when bp high. Feeling better. Occ caffeine.  Insomnia-long time.  Wakes up 2am(husb passed 3 yrs ago).  Then the ADD kicks in.  Falling asleep fine. Trazodone-felt weird.  Tizanidine not keep asleep.  RLS at HS and day  not sure if nervous tic.  Will do in sleep.  ADHD-did well on adderall but off d/t bp and not good.    Health Maintenance Due  Topic Date Due   HIV Screening  Never done   Hepatitis C Screening  Never done   COLONOSCOPY (Pts 45-61yrs Insurance coverage will need to be confirmed)  Never done    Past Medical History:  Diagnosis Date   ADD (attention deficit disorder)    Anxiety    Asthma    Blood transfusion without reported diagnosis    Depression    GERD (gastroesophageal reflux disease)    IBS (irritable bowel syndrome)    Insomnia    Lumbago    Migraine    Sciatica    Sleep apnea    Tietze's disease     Past Surgical History:  Procedure Laterality Date   APPENDECTOMY     CESAREAN SECTION     TONSILLECTOMY     TOTAL ABDOMINAL HYSTERECTOMY W/ BILATERAL SALPINGOOPHORECTOMY     TUBAL LIGATION      Outpatient Medications Prior to Visit  Medication Sig Dispense Refill   AIMOVIG 140 MG/ML SOAJ Inject 140 mg into the skin every 30 (thirty) days. 1 mL 2   albuterol (VENTOLIN HFA) 108 (90 Base) MCG/ACT inhaler Inhale 2 puffs into the lungs every 6 (six) hours as needed for wheezing or shortness of breath. 18 g 0   budesonide-formoterol (SYMBICORT) 160-4.5 MCG/ACT inhaler Inhale 2 puffs into the lungs 2 (two) times daily. 1 each 3   fluticasone (FLONASE) 50 MCG/ACT nasal spray Place 1 spray into both nostrils  daily. 16 g 0   gabapentin (NEURONTIN) 600 MG tablet Take 2 tablets (1,200 mg total) by mouth 2 (two) times daily. 360 tablet 0   losartan (COZAAR) 100 MG tablet Take 1 tablet (100 mg total) by mouth daily. 90 tablet 0   methylPREDNISolone (MEDROL DOSEPAK) 4 MG TBPK tablet Take as directed 21 tablet 0   oxyCODONE-acetaminophen (PERCOCET) 10-325 MG tablet Take 1 tablet by mouth every 8 (eight) hours as needed for up to 5 days for pain. 12 tablet 0   pantoprazole (PROTONIX) 40 MG tablet Take 1 tablet (40 mg total) by mouth daily. 90 tablet 0   rizatriptan (MAXALT) 10 MG tablet Take 1 tablet (10 mg total) by mouth as needed for migraine. May repeat in 2 hours if needed 10 tablet 0   tamsulosin (FLOMAX) 0.4 MG CAPS capsule Take 1 capsule (0.4 mg total) by mouth daily. 30 capsule 3   amphetamine-dextroamphetamine (ADDERALL) 20 MG tablet Take 1 tablet (20 mg total) by mouth 2 (two) times daily. 60 tablet 0   amoxicillin-clavulanate (AUGMENTIN) 875-125 MG tablet Take 1 tablet by mouth 2 (two) times daily. (Patient not taking: Reported on 04/10/2022) 20 tablet 0   No facility-administered  medications prior to visit.    Allergies  Allergen Reactions   Cefazolin Hives   Ondansetron Hcl Hives   Sulfa Antibiotics Rash   Tramadol Hcl     Other reaction(s): chest pain   Emgality [Galcanezumab-Gnlm]    Ultram [Tramadol] Other (See Comments)    Tachycardia   Sulfasalazine Rash   ROS neg/noncontributory except as noted HPI/below      Objective:     BP 102/68   Pulse (!) 101   Temp 98.3 F (36.8 C) (Temporal)   Ht 4\' 11"  (1.499 m)   Wt 144 lb 8 oz (65.5 kg)   SpO2 98%   BMI 29.19 kg/m  Wt Readings from Last 3 Encounters:  04/10/22 144 lb 8 oz (65.5 kg)  03/27/22 140 lb 4 oz (63.6 kg)  10/17/21 138 lb (62.6 kg)    Physical Exam   Gen: WDWN NAD HEENT: NCAT, conjunctiva not injected, sclera nonicteric NECK:  supple, no thyromegaly, no nodes, no carotid bruits CARDIAC: RRR, S1S2+, no  murmur. DP 2+B LUNGS: CTAB. No wheezes ABDOMEN:  BS+, soft, NTND, No HSM, no masses EXT:  no edema MSK: no gross abnormalities.  NEURO: A&O x3.  CN II-XII intact.  PSYCH: normal mood. Good eye contact     Assessment & Plan:   Problem List Items Addressed This Visit       Cardiovascular and Mediastinum   Essential hypertension - Primary     Other   ADHD    No orders of the defined types were placed in this encounter.   Wellington Hampshire, MD

## 2022-04-12 NOTE — Progress Notes (Signed)
1.  Your cholesterol levels are elevated.  Work on low cholesterol and lower carbs/sugars diet and  get exercise to try to lower your cholesterol.  2  slight anemia-any dark stools? Has she had a colonoscopy yet?.  See if can add iron studies and B12 to labs.  If not, repeat cbc and studies in 3 wks.

## 2022-04-13 ENCOUNTER — Other Ambulatory Visit: Payer: Self-pay | Admitting: Family Medicine

## 2022-04-13 ENCOUNTER — Other Ambulatory Visit (HOSPITAL_BASED_OUTPATIENT_CLINIC_OR_DEPARTMENT_OTHER): Payer: Self-pay

## 2022-04-13 ENCOUNTER — Other Ambulatory Visit: Payer: Self-pay | Admitting: *Deleted

## 2022-04-13 DIAGNOSIS — F902 Attention-deficit hyperactivity disorder, combined type: Secondary | ICD-10-CM

## 2022-04-13 DIAGNOSIS — D649 Anemia, unspecified: Secondary | ICD-10-CM

## 2022-04-13 MED ORDER — AMPHETAMINE-DEXTROAMPHETAMINE 20 MG PO TABS: 20.0000 mg | ORAL_TABLET | Freq: Two times a day (BID) | ORAL | 0 refills | Status: DC

## 2022-04-14 LAB — COMPREHENSIVE METABOLIC PANEL
AG Ratio: 1.6 (calc) (ref 1.0–2.5)
ALT: 10 U/L (ref 6–29)
AST: 16 U/L (ref 10–35)
Albumin: 4.3 g/dL (ref 3.6–5.1)
Alkaline phosphatase (APISO): 62 U/L (ref 31–125)
BUN: 15 mg/dL (ref 7–25)
CO2: 28 mmol/L (ref 20–32)
Calcium: 9.4 mg/dL (ref 8.6–10.2)
Chloride: 102 mmol/L (ref 98–110)
Creat: 0.69 mg/dL (ref 0.50–0.99)
Globulin: 2.7 g/dL (calc) (ref 1.9–3.7)
Glucose, Bld: 89 mg/dL (ref 65–99)
Potassium: 4.9 mmol/L (ref 3.5–5.3)
Sodium: 141 mmol/L (ref 135–146)
Total Bilirubin: 0.2 mg/dL (ref 0.2–1.2)
Total Protein: 7 g/dL (ref 6.1–8.1)

## 2022-04-14 LAB — IRON, TOTAL/TOTAL IRON BINDING CAP
%SAT: 14 % (calc) — ABNORMAL LOW (ref 16–45)
Iron: 55 ug/dL (ref 40–190)
TIBC: 384 mcg/dL (calc) (ref 250–450)

## 2022-04-14 LAB — LIPID PANEL
Cholesterol: 235 mg/dL — ABNORMAL HIGH (ref <200)
HDL: 76 mg/dL (ref >=50)
LDL Cholesterol (Calc): 139 mg/dL (calc) — ABNORMAL HIGH
Non-HDL Cholesterol (Calc): 159 mg/dL (calc) — ABNORMAL HIGH (ref <130)
Total CHOL/HDL Ratio: 3.1 (calc) (ref <5.0)
Triglycerides: 95 mg/dL (ref <150)

## 2022-04-14 LAB — CBC WITH DIFFERENTIAL/PLATELET
Absolute Monocytes: 764 cells/uL (ref 200–950)
Basophils Absolute: 92 cells/uL (ref 0–200)
Basophils Relative: 1 %
Eosinophils Absolute: 331 cells/uL (ref 15–500)
Eosinophils Relative: 3.6 %
HCT: 35.1 % (ref 35.0–45.0)
Hemoglobin: 11.5 g/dL — ABNORMAL LOW (ref 11.7–15.5)
Lymphs Abs: 3892 cells/uL (ref 850–3900)
MCH: 30.3 pg (ref 27.0–33.0)
MCHC: 32.8 g/dL (ref 32.0–36.0)
MCV: 92.6 fL (ref 80.0–100.0)
MPV: 11.4 fL (ref 7.5–12.5)
Monocytes Relative: 8.3 %
Neutro Abs: 4122 cells/uL (ref 1500–7800)
Neutrophils Relative %: 44.8 %
Platelets: 334 10*3/uL (ref 140–400)
RBC: 3.79 10*6/uL — ABNORMAL LOW (ref 3.80–5.10)
RDW: 11.9 % (ref 11.0–15.0)
Total Lymphocyte: 42.3 %
WBC: 9.2 10*3/uL (ref 3.8–10.8)

## 2022-04-14 LAB — TEST AUTHORIZATION

## 2022-04-14 LAB — TSH: TSH: 1.1 mIU/L

## 2022-04-14 LAB — HEMOGLOBIN A1C
Hgb A1c MFr Bld: 5.6 % of total Hgb (ref <5.7)
Mean Plasma Glucose: 114 mg/dL
eAG (mmol/L): 6.3 mmol/L

## 2022-04-14 LAB — VITAMIN B12: Vitamin B-12: 379 pg/mL (ref 200–1100)

## 2022-04-15 ENCOUNTER — Other Ambulatory Visit: Payer: Self-pay | Admitting: *Deleted

## 2022-04-15 DIAGNOSIS — E611 Iron deficiency: Secondary | ICD-10-CM

## 2022-04-16 ENCOUNTER — Other Ambulatory Visit: Payer: Self-pay | Admitting: Family Medicine

## 2022-04-16 MED ORDER — NYSTATIN 100000 UNIT/ML MT SUSP: 5.0000 mL | Freq: Four times a day (QID) | OROMUCOSAL | 0 refills | Status: DC

## 2022-04-24 ENCOUNTER — Telehealth: Payer: Self-pay

## 2022-04-24 NOTE — Telephone Encounter (Signed)
Patient would like medication sent to [facility].

## 2022-04-24 NOTE — Telephone Encounter (Signed)
Patient c/o of ongoing lower back pain since last visit with PCP. Requesting a referral for PT and medication to help manage pain in the meantime. Please advise

## 2022-04-27 ENCOUNTER — Other Ambulatory Visit: Payer: Self-pay | Admitting: *Deleted

## 2022-04-27 DIAGNOSIS — M545 Low back pain, unspecified: Secondary | ICD-10-CM

## 2022-04-27 NOTE — Telephone Encounter (Signed)
Patient notified. Referral placed for PT.

## 2022-05-04 ENCOUNTER — Other Ambulatory Visit: Payer: Self-pay | Admitting: Family Medicine

## 2022-05-04 ENCOUNTER — Other Ambulatory Visit (HOSPITAL_BASED_OUTPATIENT_CLINIC_OR_DEPARTMENT_OTHER): Payer: Self-pay

## 2022-05-04 DIAGNOSIS — G8929 Other chronic pain: Secondary | ICD-10-CM

## 2022-05-04 MED ORDER — HYDROCODONE-ACETAMINOPHEN 10-325 MG PO TABS
1.0000 | ORAL_TABLET | Freq: Three times a day (TID) | ORAL | 0 refills | Status: DC | PRN
  Filled 2022-05-04: qty 15, 5d supply, fill #0

## 2022-05-04 MED ORDER — ALBUTEROL SULFATE HFA 108 (90 BASE) MCG/ACT IN AERS
2.0000 | INHALATION_SPRAY | Freq: Four times a day (QID) | RESPIRATORY_TRACT | 1 refills | Status: DC | PRN
  Filled 2022-05-04: qty 18, 25d supply, fill #0

## 2022-05-05 ENCOUNTER — Other Ambulatory Visit (HOSPITAL_BASED_OUTPATIENT_CLINIC_OR_DEPARTMENT_OTHER): Payer: Self-pay

## 2022-05-07 ENCOUNTER — Other Ambulatory Visit: Payer: Self-pay

## 2022-05-07 DIAGNOSIS — F902 Attention-deficit hyperactivity disorder, combined type: Secondary | ICD-10-CM

## 2022-05-07 MED ORDER — AMPHETAMINE-DEXTROAMPHETAMINE 20 MG PO TABS: 20.0000 mg | ORAL_TABLET | Freq: Two times a day (BID) | ORAL | 0 refills | Status: DC

## 2022-05-07 NOTE — Telephone Encounter (Signed)
Last refill: 04/13/22 #60, 0 Last OV: 04/10/22 dx. HTN

## 2022-05-11 ENCOUNTER — Other Ambulatory Visit: Payer: Self-pay | Admitting: Family Medicine

## 2022-05-11 ENCOUNTER — Other Ambulatory Visit (HOSPITAL_BASED_OUTPATIENT_CLINIC_OR_DEPARTMENT_OTHER): Payer: Self-pay

## 2022-05-11 ENCOUNTER — Ambulatory Visit (INDEPENDENT_AMBULATORY_CARE_PROVIDER_SITE_OTHER): Payer: Medicaid Other | Admitting: Family Medicine

## 2022-05-11 ENCOUNTER — Encounter: Payer: Self-pay | Admitting: Family Medicine

## 2022-05-11 VITALS — BP 155/114 | HR 93 | Temp 98.0°F | Ht 59.0 in | Wt 138.0 lb

## 2022-05-11 DIAGNOSIS — I10 Essential (primary) hypertension: Secondary | ICD-10-CM

## 2022-05-11 DIAGNOSIS — K0889 Other specified disorders of teeth and supporting structures: Secondary | ICD-10-CM

## 2022-05-11 MED ORDER — KETOROLAC TROMETHAMINE 60 MG/2ML IM SOLN
60.0000 mg | Freq: Once | INTRAMUSCULAR | Status: AC
  Administered 2022-05-11: 60 mg via INTRAMUSCULAR

## 2022-05-11 MED ORDER — PENICILLIN V POTASSIUM 500 MG PO TABS
500.0000 mg | ORAL_TABLET | Freq: Three times a day (TID) | ORAL | 0 refills | Status: AC
  Filled 2022-05-11: qty 30, 10d supply, fill #0

## 2022-05-11 MED ORDER — HYDROCODONE-ACETAMINOPHEN 10-325 MG PO TABS: 1.0000 | ORAL_TABLET | Freq: Three times a day (TID) | ORAL | 0 refills | Status: DC | PRN

## 2022-05-11 NOTE — Progress Notes (Signed)
   Laurie Roman is a 46 y.o. female who presents today for an office visit.  Assessment/Plan:  New/Acute Problems: Dental Pain No red flags.  We will start penicillin until she can get into see her dentist.  She does have a history of allergy to Ancef however has done well with penicillin in the past.  We will give 60 mg of Toradol today.  She will need to avoid ibuprofen for the rest today.  Advised her to follow-up with her PCP for ongoing pain management needs if she needs something stronger.  Chronic Problems Addressed Today: Essential Hypertension Elevated today in setting acute pain.  She has been well controlled the past on losartan 100 mg daily.  We will continue regimen for now she will continue to monitor.  She will let us know if persistently elevated.    Subjective:  HPI:  Patient here with dental pain. She bit down on her tongue ring 2 days ago which caused her to the fracture.  She has had significant pain since then.  Located on right lower jaw.  Ibuprofen has not helped.  No fevers or chills.  She called her dentist and will be scheduling an appointment soon.  She was recommended to start on antibiotic.   She will be following up with the dentist soon.        Objective:  Physical Exam: BP (!) 160/110   Pulse 93   Temp 98 F (36.7 C) (Temporal)   Ht 4\' 11"  (1.499 m)   Wt 138 lb (62.6 kg)   SpO2 98%   BMI 27.87 kg/m   Gen: No acute distress, resting comfortably HEENT: Inflammation and erythema along right lower molar gumline. CV: Regular rate and rhythm with no murmurs appreciated Pulm: Normal work of breathing, clear to auscultation bilaterally with no crackles, wheezes, or rhonchi Neuro: Grossly normal, moves all extremities Psych: Normal affect and thought content      Natania Finigan M. , MD 05/11/2022 9:23 AM

## 2022-05-11 NOTE — Addendum Note (Signed)
Addended by: Dyann Kief on: 05/11/2022 09:36 AM   Modules accepted: Orders

## 2022-05-11 NOTE — Patient Instructions (Addendum)
It was very nice to see you today!  We we will send in a prescription for penicillin.  We will also give you a Toradol injection today.  Take care, Dr Jimmey Ralph  PLEASE NOTE:  If you had any lab tests please let us know if you have not heard back within a few days. You may see your results on mychart before we have a chance to review them but we will give you a call once they are reviewed by [place]. If we ordered any referrals today, please let us know if you have not heard from their office within the next week.   Please try these tips to maintain a healthy lifestyle:  Eat at least 3 REAL meals and 1-2 snacks per day.  Aim for no more than 5 hours between eating.  If you eat breakfast, please do so within one hour of getting up.   Each meal should contain half fruits/vegetables, one quarter protein, and one quarter carbs (no bigger than a computer mouse)  Cut down on sweet beverages. This includes juice, soda, and sweet tea.   Drink at least 1 glass of water with each meal and aim for at least 8 glasses per day  Exercise at least 150 minutes every week.

## 2022-05-13 ENCOUNTER — Other Ambulatory Visit (HOSPITAL_COMMUNITY)
Admission: RE | Admit: 2022-05-13 | Discharge: 2022-05-13 | Disposition: A | Discharge status: Home / Self Care | Payer: Medicaid Other | Source: Ambulatory Visit | Attending: Family Medicine | Admitting: Family Medicine

## 2022-05-13 ENCOUNTER — Other Ambulatory Visit (INDEPENDENT_AMBULATORY_CARE_PROVIDER_SITE_OTHER): Payer: Medicaid Other

## 2022-05-13 DIAGNOSIS — E611 Iron deficiency: Secondary | ICD-10-CM | POA: Diagnosis not present

## 2022-05-13 DIAGNOSIS — Z113 Encounter for screening for infections with a predominantly sexual mode of transmission: Secondary | ICD-10-CM | POA: Insufficient documentation

## 2022-05-13 DIAGNOSIS — D649 Anemia, unspecified: Secondary | ICD-10-CM

## 2022-05-14 ENCOUNTER — Other Ambulatory Visit: Payer: Self-pay

## 2022-05-14 LAB — CBC WITH DIFFERENTIAL/PLATELET
Basophils Absolute: 0.1 10*3/uL (ref 0.0–0.1)
Basophils Relative: 1.1 % (ref 0.0–3.0)
Eosinophils Absolute: 0.3 10*3/uL (ref 0.0–0.7)
Eosinophils Relative: 2.2 % (ref 0.0–5.0)
HCT: 37.2 % (ref 36.0–46.0)
Hemoglobin: 12.6 g/dL (ref 12.0–15.0)
Lymphocytes Relative: 24.9 % (ref 12.0–46.0)
Lymphs Abs: 3 10*3/uL (ref 0.7–4.0)
MCHC: 33.8 g/dL (ref 30.0–36.0)
MCV: 92.2 fl (ref 78.0–100.0)
Monocytes Absolute: 1 10*3/uL (ref 0.1–1.0)
Monocytes Relative: 7.9 % (ref 3.0–12.0)
Neutro Abs: 7.7 10*3/uL (ref 1.4–7.7)
Neutrophils Relative %: 63.9 % (ref 43.0–77.0)
Platelets: 398 10*3/uL (ref 150.0–400.0)
RBC: 4.04 Mil/uL (ref 3.87–5.11)
RDW: 13.7 % (ref 11.5–15.5)
WBC: 12.1 10*3/uL — ABNORMAL HIGH (ref 4.0–10.5)

## 2022-05-14 LAB — VITAMIN B12: Vitamin B-12: 236 pg/mL (ref 211–911)

## 2022-05-14 LAB — IRON,TIBC AND FERRITIN PANEL
%SAT: 17 % (calc) (ref 16–45)
Ferritin: 48 ng/mL (ref 16–232)
Iron: 63 ug/dL (ref 40–190)
TIBC: 374 mcg/dL (calc) (ref 250–450)

## 2022-05-14 MED ORDER — TIZANIDINE HCL 4 MG PO TABS: 4.0000 mg | ORAL_TABLET | Freq: Four times a day (QID) | ORAL | 3 refills | Status: DC | PRN

## 2022-05-14 MED ORDER — AIMOVIG 140 MG/ML ~~LOC~~ SOAJ: 140.0000 mg | SUBCUTANEOUS | 2 refills | Status: DC

## 2022-05-14 NOTE — Progress Notes (Signed)
Iron better.  Continue vitamin w/iron. B12 low-doe she want to do pills otc or do injections? Weekly x 4 then monthly if chooses that route.

## 2022-05-15 LAB — URINE CYTOLOGY ANCILLARY ONLY
Bacterial Vaginitis-Urine: NEGATIVE
Candida Urine: NEGATIVE — AB
Candida Urine: POSITIVE — AB
Chlamydia: NEGATIVE
Comment: NEGATIVE
Comment: NEGATIVE
Comment: NORMAL
Neisseria Gonorrhea: NEGATIVE
Trichomonas: NEGATIVE

## 2022-05-18 ENCOUNTER — Other Ambulatory Visit: Payer: Self-pay | Admitting: Family Medicine

## 2022-05-18 ENCOUNTER — Other Ambulatory Visit: Payer: Self-pay | Admitting: *Deleted

## 2022-05-18 ENCOUNTER — Other Ambulatory Visit (HOSPITAL_BASED_OUTPATIENT_CLINIC_OR_DEPARTMENT_OTHER): Payer: Self-pay

## 2022-05-18 MED ORDER — FLUCONAZOLE 150 MG PO TABS
150.0000 mg | ORAL_TABLET | Freq: Once | ORAL | 2 refills | Status: AC
  Filled 2022-05-18: qty 1, 1d supply, fill #0

## 2022-05-18 MED ORDER — CLINDAMYCIN HCL 300 MG PO CAPS
300.0000 mg | ORAL_CAPSULE | Freq: Three times a day (TID) | ORAL | 0 refills | Status: AC
  Filled 2022-05-18: qty 30, 10d supply, fill #0

## 2022-05-18 MED ORDER — HYDROCODONE-ACETAMINOPHEN 10-325 MG PO TABS
1.0000 | ORAL_TABLET | Freq: Four times a day (QID) | ORAL | 0 refills | Status: DC | PRN
  Filled 2022-05-18: qty 20, 5d supply, fill #0

## 2022-05-18 NOTE — Progress Notes (Signed)
Broken tooth, R lower.  Red/inflammed gums.  Clinda and hydro-failing pcn

## 2022-05-20 ENCOUNTER — Ambulatory Visit (INDEPENDENT_AMBULATORY_CARE_PROVIDER_SITE_OTHER): Payer: Medicaid Other

## 2022-05-20 DIAGNOSIS — E538 Deficiency of other specified B group vitamins: Secondary | ICD-10-CM

## 2022-05-20 MED ORDER — CYANOCOBALAMIN 1000 MCG/ML IJ SOLN
1000.0000 ug | Freq: Once | INTRAMUSCULAR | Status: AC
  Administered 2022-05-20: 1000 ug via INTRAMUSCULAR

## 2022-05-20 NOTE — Progress Notes (Addendum)
Laurie Roman 46 yr old female presents to office for 1 of 3 weekly B12 injections per Lutricia Horsfall, MD. Administered CYANOCOBALAMIN 1,000 mcg IM right arm. Patient tolerated well.   agree

## 2022-05-21 ENCOUNTER — Telehealth: Payer: Self-pay

## 2022-05-21 NOTE — Telephone Encounter (Signed)
error 

## 2022-05-22 ENCOUNTER — Ambulatory Visit (INDEPENDENT_AMBULATORY_CARE_PROVIDER_SITE_OTHER): Payer: Medicaid Other | Admitting: Family Medicine

## 2022-05-22 ENCOUNTER — Other Ambulatory Visit (HOSPITAL_BASED_OUTPATIENT_CLINIC_OR_DEPARTMENT_OTHER): Payer: Self-pay

## 2022-05-22 ENCOUNTER — Encounter: Payer: Self-pay | Admitting: Family Medicine

## 2022-05-22 VITALS — BP 140/80 | HR 86 | Temp 98.4°F | Ht 59.0 in | Wt 138.4 lb

## 2022-05-22 DIAGNOSIS — K0889 Other specified disorders of teeth and supporting structures: Secondary | ICD-10-CM

## 2022-05-22 MED ORDER — HYDROCODONE-ACETAMINOPHEN 10-325 MG PO TABS
1.0000 | ORAL_TABLET | Freq: Four times a day (QID) | ORAL | 0 refills | Status: DC | PRN
  Filled 2022-05-22: qty 30, 8d supply, fill #0

## 2022-05-22 MED ORDER — KETOROLAC TROMETHAMINE 60 MG/2ML IM SOLN
60.0000 mg | Freq: Once | INTRAMUSCULAR | Status: AC
  Administered 2022-05-22: 60 mg via INTRAMUSCULAR

## 2022-05-22 NOTE — Progress Notes (Signed)
Subjective:     Patient ID: Laurie Roman, female    DOB: [birth year], 46 y.o.   MRN: [id number]  Chief Complaint  Patient presents with   Abscessed Tooth    Abscessed tooth, bottom R causing pain    HPI Abscessed tooth, bottom R.  Saw DDS yesterday.  Placed on abx again.  DDS won't do narcs d/t adderall per pt.   Referral in progress for removal.   Face swollen today, flushed, pain worse. Bp up.  Requesting toradol and pain meds.    There are no preventive care reminders to display for this patient.  Past Medical History:  Diagnosis Date   ADD (attention deficit disorder)    Anxiety    Asthma    Blood transfusion without reported diagnosis    Depression    GERD (gastroesophageal reflux disease)    IBS (irritable bowel syndrome)    Insomnia    Lumbago    Migraine    Sciatica    Sleep apnea    Tietze's disease     Past Surgical History:  Procedure Laterality Date   APPENDECTOMY     CESAREAN SECTION     TONSILLECTOMY     TOTAL ABDOMINAL HYSTERECTOMY W/ BILATERAL SALPINGOOPHORECTOMY     TUBAL LIGATION      Outpatient Medications Prior to Visit  Medication Sig Dispense Refill   albuterol (VENTOLIN HFA) 108 (90 Base) MCG/ACT inhaler Inhale 2 puffs into the lungs every 6 (six) hours as needed for wheezing or shortness of breath. 18 g 1   amphetamine-dextroamphetamine (ADDERALL) 20 MG tablet Take 1 tablet (20 mg total) by mouth 2 (two) times daily. 60 tablet 0   budesonide-formoterol (SYMBICORT) 160-4.5 MCG/ACT inhaler Inhale 2 puffs into the lungs 2 (two) times daily. 1 each 3   clindamycin (CLEOCIN) 300 MG capsule Take 1 capsule (300 mg total) by mouth 3 (three) times daily for 10 days. 30 capsule 0   Erenumab-aooe (AIMOVIG) 140 MG/ML SOAJ Inject 140 mg into the skin every 30 (thirty) days. 1 mL 2   fluconazole (DIFLUCAN) 150 MG tablet Take 1 tablet (150 mg total) by mouth every three (3) days as needed. 2 tablet 0   fluticasone (FLONASE) 50 MCG/ACT nasal spray Place 1  spray into both nostrils daily. 16 g 0   gabapentin (NEURONTIN) 600 MG tablet Take 2 tablets (1,200 mg total) by mouth 2 (two) times daily. 360 tablet 0   losartan (COZAAR) 100 MG tablet Take 1 tablet (100 mg total) by mouth daily. 90 tablet 0   methylPREDNISolone (MEDROL DOSEPAK) 4 MG TBPK tablet Take as directed 21 tablet 0   nystatin (MYCOSTATIN) 100000 UNIT/ML suspension Take 5 mLs (500,000 Units total) by mouth 4 (four) times daily. Swish and hold in mouth few seconds and swallow 60 mL 0   pantoprazole (PROTONIX) 40 MG tablet Take 1 tablet (40 mg total) by mouth daily. 90 tablet 0   prazosin (MINIPRESS) 1 MG capsule Take 1 capsule (1 mg total) by mouth at bedtime. 30 capsule 2   rizatriptan (MAXALT) 10 MG tablet Take 1 tablet (10 mg total) by mouth as needed for migraine. May repeat in 2 hours if needed 10 tablet 0   tiZANidine (ZANAFLEX) 4 MG tablet Take 1 tablet (4 mg total) by mouth every 6 (six) hours as needed for muscle spasms. 30 tablet 3   HYDROcodone-acetaminophen (NORCO) 10-325 MG tablet Take 1 tablet by mouth every 6 (six) hours as needed for up to 5  days. 20 tablet 0   No facility-administered medications prior to visit.    Allergies  Allergen Reactions   Cefazolin Hives   Ondansetron Hcl Hives   Sulfa Antibiotics Rash   Tramadol Hcl     Other reaction(s): chest pain   Emgality [Galcanezumab-Gnlm]    Ultram [Tramadol] Other (See Comments)    Tachycardia   Sulfasalazine Rash   ROS neg/noncontributory except as noted HPI/below      Objective:     BP (!) 140/80   Pulse 86   Temp 98.4 F (36.9 C) (Temporal)   Ht 4\' 11"  (1.499 m)   Wt 138 lb 6 oz (62.8 kg)   SpO2 98%   BMI 27.95 kg/m  Wt Readings from Last 3 Encounters:  05/22/22 138 lb 6 oz (62.8 kg)  05/11/22 138 lb (62.6 kg)  04/10/22 144 lb 8 oz (65.5 kg)    Physical Exam   Gen: WDWN  in pain.   HEENT: NCAT, conjunctiva not injected, sclera nonicteric NECK:  supple, no thyromegaly,  R face-some  swelling.  Tender.  +tender R submand nodes. Face flushed.  Still w/broken tooth and swollen gum EXT:  no edema MSK: no gross abnormalities.  NEURO: A&O x3.  CN II-XII intact.  PSYCH: normal mood. Good eye contact     Assessment & Plan:   Problem List Items Addressed This Visit   None Visit Diagnoses     Pain, dental    -  Primary   Relevant Medications   ketorolac (TORADOL) injection 60 mg (Completed)      Dental pain-abscessed/broken tooth    will do sot of toradol 60mg .  Already on abx.  Aware definitive tx is removal of tooth-in progress.  PDMP checked.  A lot of pain.  Sent norco 10/325 #30.     Meds ordered this encounter  Medications   ketorolac (TORADOL) injection 60 mg   HYDROcodone-acetaminophen (NORCO) 10-325 MG tablet    Sig: Take 1 tablet by mouth every 6 (six) hours as needed.    Dispense:  30 tablet    Refill:  0    13/03/23, MD

## 2022-05-28 ENCOUNTER — Other Ambulatory Visit (HOSPITAL_BASED_OUTPATIENT_CLINIC_OR_DEPARTMENT_OTHER): Payer: Self-pay

## 2022-05-28 ENCOUNTER — Other Ambulatory Visit: Payer: Self-pay | Admitting: Family Medicine

## 2022-05-28 MED ORDER — MELOXICAM 15 MG PO TABS
15.0000 mg | ORAL_TABLET | Freq: Every day | ORAL | 0 refills | Status: DC
  Filled 2022-05-28: qty 30, 30d supply, fill #0

## 2022-05-28 MED ORDER — AMLODIPINE BESYLATE 5 MG PO TABS
5.0000 mg | ORAL_TABLET | Freq: Every day | ORAL | 0 refills | Status: DC
  Filled 2022-05-28: qty 30, 30d supply, fill #0

## 2022-05-28 MED ORDER — HYDROCODONE-ACETAMINOPHEN 10-325 MG PO TABS
1.0000 | ORAL_TABLET | Freq: Four times a day (QID) | ORAL | 0 refills | Status: DC | PRN
  Filled 2022-05-28: qty 30, 8d supply, fill #0

## 2022-05-28 MED ORDER — FLUCONAZOLE 150 MG PO TABS
150.0000 mg | ORAL_TABLET | ORAL | 0 refills | Status: DC | PRN
  Filled 2022-05-28: qty 5, 15d supply, fill #0

## 2022-05-28 NOTE — Addendum Note (Signed)
Addended by: Angelena Sole on: 05/28/2022 01:11 PM   Modules accepted: Orders

## 2022-05-29 ENCOUNTER — Other Ambulatory Visit (HOSPITAL_BASED_OUTPATIENT_CLINIC_OR_DEPARTMENT_OTHER): Payer: Self-pay

## 2022-06-03 ENCOUNTER — Other Ambulatory Visit: Payer: Self-pay | Admitting: Family Medicine

## 2022-06-03 ENCOUNTER — Other Ambulatory Visit: Payer: Self-pay

## 2022-06-03 ENCOUNTER — Encounter: Payer: Self-pay | Admitting: Family Medicine

## 2022-06-03 DIAGNOSIS — F902 Attention-deficit hyperactivity disorder, combined type: Secondary | ICD-10-CM

## 2022-06-03 MED ORDER — AMPHETAMINE-DEXTROAMPHETAMINE 20 MG PO TABS: 20.0000 mg | ORAL_TABLET | Freq: Two times a day (BID) | ORAL | 0 refills | Status: DC

## 2022-06-03 NOTE — Telephone Encounter (Signed)
Last refill: 05/07/22 #60, 0 Last OV: 04/10/22 dx. F/u   Last refill: 05/28/22 #30, 0 Last OV: 04/10/22 dx. F/u

## 2022-06-05 ENCOUNTER — Other Ambulatory Visit: Payer: Self-pay | Admitting: Family Medicine

## 2022-06-05 MED ORDER — HYDROCODONE-ACETAMINOPHEN 10-325 MG PO TABS: 1.0000 | ORAL_TABLET | Freq: Four times a day (QID) | ORAL | 0 refills | Status: DC | PRN

## 2022-06-05 NOTE — Telephone Encounter (Signed)
Spoke to patient and she stated that she asked the pharmacy about the Paxlovid and if they thought she should take it and they told her to ask her doctor. She stated that if it's not going to work, she doesn't want to spend her money on it. Tested positive on Wednesday. Patient does need pain medication sent to the pharmacy. Can send patient message directly to let her know what your recommendations are.

## 2022-06-10 ENCOUNTER — Ambulatory Visit (INDEPENDENT_AMBULATORY_CARE_PROVIDER_SITE_OTHER): Payer: Medicaid Other

## 2022-06-10 DIAGNOSIS — E538 Deficiency of other specified B group vitamins: Secondary | ICD-10-CM

## 2022-06-10 MED ORDER — CYANOCOBALAMIN 1000 MCG/ML IJ SOLN
1000.0000 ug | Freq: Once | INTRAMUSCULAR | Status: AC
  Administered 2022-06-10: 1000 ug via INTRAMUSCULAR

## 2022-06-10 NOTE — Progress Notes (Signed)
Laurie Roman 47 yr old female presents to office today for 2nd of 4 weekly B12 injections per Esther Hardy, MD. Administered CYANOCOBALAMIN 1,000 mcg IM right arm. Patient tolerated well.

## 2022-06-12 ENCOUNTER — Other Ambulatory Visit: Payer: Self-pay | Admitting: Family Medicine

## 2022-06-12 MED ORDER — CLINDAMYCIN HCL 300 MG PO CAPS: 300.0000 mg | ORAL_CAPSULE | Freq: Three times a day (TID) | ORAL | 1 refills | Status: DC

## 2022-06-13 ENCOUNTER — Other Ambulatory Visit (HOSPITAL_BASED_OUTPATIENT_CLINIC_OR_DEPARTMENT_OTHER): Payer: Self-pay

## 2022-06-15 ENCOUNTER — Other Ambulatory Visit: Payer: Self-pay | Admitting: Nurse Practitioner

## 2022-06-15 ENCOUNTER — Other Ambulatory Visit (HOSPITAL_BASED_OUTPATIENT_CLINIC_OR_DEPARTMENT_OTHER): Payer: Self-pay

## 2022-06-15 ENCOUNTER — Other Ambulatory Visit: Payer: Self-pay

## 2022-06-15 MED ORDER — HYDROCODONE-ACETAMINOPHEN 10-325 MG PO TABS
1.0000 | ORAL_TABLET | Freq: Four times a day (QID) | ORAL | 0 refills | Status: DC | PRN
  Filled 2022-06-15: qty 15, 5d supply, fill #0

## 2022-06-15 NOTE — Telephone Encounter (Signed)
Patient states that she is waiting to be contacted by dentist to have tooth removed. Still experiencing right jaw pain that is now radiating behind her ear and up into her sinuses. Requesting refill be sent to [place]

## 2022-06-16 NOTE — Telephone Encounter (Signed)
Requested medication (s) are due for refill today: routing for review  Requested medication (s) are on the active medication list: yes  Last refill:  05/04/22  Future visit scheduled:yes  Notes to clinic:  Unable to refill per protocol, last refill by another provider.      Requested Prescriptions  Pending Prescriptions Disp Refills   VENTOLIN HFA 108 (90 Base) MCG/ACT inhaler [Pharmacy Med Name: VENTOLIN HFA 90 MCG INHALER] 18 g 0    Sig: Inhale 2 puffs into the lungs every 6 (six) hours as needed for wheezing or shortness of breath.     Pulmonology:  Beta Agonists 2 Failed - 06/15/2022  6:34 PM      Failed - Last BP in normal range    BP Readings from Last 1 Encounters:  05/22/22 (!) 140/80         Passed - Last Heart Rate in normal range    Pulse Readings from Last 1 Encounters:  05/22/22 86         Passed - Valid encounter within last 12 months    Recent Outpatient Visits           8 months ago Attention deficit hyperactivity disorder (ADHD), combined type   [facility] Jon Billings, NP   9 months ago Depression, major, single episode, severe ([place])   [facility] Jon Billings, NP   1 year ago Migraine without status migrainosus, not intractable, unspecified migraine type   [facility] Jon Billings, NP   1 year ago Essential hypertension   [facility] Jon Billings, NP   1 year ago Attention deficit hyperactivity disorder (ADHD), combined type   [facility] Jon Billings, NP

## 2022-06-19 ENCOUNTER — Other Ambulatory Visit: Payer: Self-pay

## 2022-06-19 ENCOUNTER — Other Ambulatory Visit: Payer: Self-pay | Admitting: Family Medicine

## 2022-06-19 NOTE — Telephone Encounter (Signed)
Last refill: 06/15/22 #15, 0 Last OV: 05/22/22 dx. Dental pain

## 2022-06-20 MED ORDER — HYDROCODONE-ACETAMINOPHEN 10-325 MG PO TABS: 1.0000 | ORAL_TABLET | Freq: Four times a day (QID) | ORAL | 0 refills | Status: DC | PRN

## 2022-06-22 ENCOUNTER — Ambulatory Visit (INDEPENDENT_AMBULATORY_CARE_PROVIDER_SITE_OTHER): Payer: Medicaid Other

## 2022-06-22 ENCOUNTER — Other Ambulatory Visit: Payer: Self-pay

## 2022-06-22 ENCOUNTER — Other Ambulatory Visit (HOSPITAL_BASED_OUTPATIENT_CLINIC_OR_DEPARTMENT_OTHER): Payer: Self-pay

## 2022-06-22 DIAGNOSIS — E538 Deficiency of other specified B group vitamins: Secondary | ICD-10-CM | POA: Diagnosis not present

## 2022-06-22 DIAGNOSIS — G43909 Migraine, unspecified, not intractable, without status migrainosus: Secondary | ICD-10-CM

## 2022-06-22 MED ORDER — CYANOCOBALAMIN 1000 MCG/ML IJ SOLN
1000.0000 ug | Freq: Once | INTRAMUSCULAR | Status: AC
  Administered 2022-06-22: 1000 ug via INTRAMUSCULAR

## 2022-06-22 MED ORDER — RIZATRIPTAN BENZOATE 10 MG PO TABS: 10.0000 mg | ORAL_TABLET | ORAL | 0 refills | Status: DC | PRN

## 2022-06-22 MED ORDER — GABAPENTIN 600 MG PO TABS: 1200.0000 mg | ORAL_TABLET | Freq: Two times a day (BID) | ORAL | 0 refills | Status: DC

## 2022-06-22 MED ORDER — PANTOPRAZOLE SODIUM 40 MG PO TBEC: 40.0000 mg | DELAYED_RELEASE_TABLET | Freq: Every day | ORAL | 0 refills | Status: DC

## 2022-06-22 NOTE — Progress Notes (Signed)
Laurie Roman 47 yr old female presents to office for 3nd of 4 weekly B12 injections per Esther Hardy, MD. Administered CYANOCOBALAMIN 1,000 mcg IM left arm. Patient tolerated well.

## 2022-06-26 ENCOUNTER — Other Ambulatory Visit: Payer: Self-pay | Admitting: Family Medicine

## 2022-06-26 MED ORDER — AMOXICILLIN 500 MG PO CAPS: 500.0000 mg | ORAL_CAPSULE | Freq: Three times a day (TID) | ORAL | 0 refills | Status: AC

## 2022-06-26 MED ORDER — FLUCONAZOLE 150 MG PO TABS: 150.0000 mg | ORAL_TABLET | ORAL | 0 refills | Status: DC | PRN

## 2022-06-26 NOTE — Progress Notes (Signed)
Abscessed tooth starting to get infected again.  Will send abx.  Also, running out of pain meds.  I sent 30 6 days ago.  Pt only taking 1-3/d.     She showed me text message from daughter that daughter boyfriend may be taking pt adderall.  So I wonder if hydo as well.

## 2022-06-29 ENCOUNTER — Other Ambulatory Visit: Payer: Self-pay | Admitting: Family Medicine

## 2022-06-29 ENCOUNTER — Encounter: Payer: Self-pay | Admitting: Internal Medicine

## 2022-06-29 ENCOUNTER — Other Ambulatory Visit (HOSPITAL_BASED_OUTPATIENT_CLINIC_OR_DEPARTMENT_OTHER): Payer: Self-pay

## 2022-06-29 ENCOUNTER — Telehealth (INDEPENDENT_AMBULATORY_CARE_PROVIDER_SITE_OTHER): Payer: Medicaid Other | Admitting: Internal Medicine

## 2022-06-29 VITALS — BP 181/110

## 2022-06-29 DIAGNOSIS — G8929 Other chronic pain: Secondary | ICD-10-CM

## 2022-06-29 DIAGNOSIS — F419 Anxiety disorder, unspecified: Secondary | ICD-10-CM

## 2022-06-29 DIAGNOSIS — I10 Essential (primary) hypertension: Secondary | ICD-10-CM | POA: Diagnosis not present

## 2022-06-29 DIAGNOSIS — K089 Disorder of teeth and supporting structures, unspecified: Secondary | ICD-10-CM | POA: Diagnosis not present

## 2022-06-29 DIAGNOSIS — F902 Attention-deficit hyperactivity disorder, combined type: Secondary | ICD-10-CM

## 2022-06-29 MED ORDER — HYDROCODONE-ACETAMINOPHEN 10-325 MG PO TABS: 1.0000 | ORAL_TABLET | Freq: Four times a day (QID) | ORAL | 0 refills | Status: DC | PRN

## 2022-06-29 MED ORDER — AMLODIPINE BESYLATE 5 MG PO TABS
5.0000 mg | ORAL_TABLET | Freq: Every day | ORAL | 0 refills | Status: DC
  Filled 2022-06-29: qty 30, 30d supply, fill #0

## 2022-06-29 MED ORDER — AMLODIPINE BESYLATE 10 MG PO TABS: 10.0000 mg | ORAL_TABLET | Freq: Every day | ORAL | 3 refills | Status: DC

## 2022-06-29 NOTE — Telephone Encounter (Signed)
Please let her know can't refill

## 2022-06-29 NOTE — Assessment & Plan Note (Signed)
Patient reports this has been flaring recently, but would prefer to wait and discuss with DrMarland Kitchen Cherlynn Kaiser next week I offered medication but she prefer hold off for now

## 2022-06-29 NOTE — Assessment & Plan Note (Signed)
Delayed care due to specialist access limited for oral surgeon She is now dependent on opioids for pain relief after several months waiting, and antibiotic(s) not resolving her cracked/abscessed tooth I refilled 1 week of same dose Dr. Cherlynn Kaiser prescribed last week- considered switching to long term medication but she will have the surgery soon so I felt it wouldn't be worth the insurance and medication switching risks at this point.

## 2022-06-29 NOTE — Assessment & Plan Note (Signed)
Labile home reads due to tooth pain and opioid dependency-withdrawal  Encouraged her to watch it but no blood pressure monitor available Will give her a little more amlodipine to manage, and continue opioid script to help reduce pain lability.

## 2022-06-29 NOTE — Progress Notes (Signed)
[facility]: [phone]   Virtual Medical Office Visit - Video Telemedicine   Patient:  Laurie Roman (1976/01/03) located at home MRN:   [id number]      Date:   06/29/2022  PCP:    Tawnya Crook, MD   [place] Provider: Loralee Pacas, MD located at office: [facility] [address] [place] [address] Today's Telemedicine visit was conducted via Video for 10 minutes after consent for telemedicine was obtained:  Video connection was never lost, but we had to use doximity due to she couldn't get audio to work on the [employer]. All video encounter participant identities and locations confirmed visually and verbally.   Problem Focused Charting:   Medical Decision Making per Assessment/Plan   Laurie Roman was seen today for hypertension and mouth hurting.  Essential hypertension Assessment & Plan: Labile home reads due to tooth pain and opioid dependency-withdrawal  Encouraged her to watch it but no blood pressure monitor available Will give her a little more amlodipine to manage, and continue opioid script to help reduce pain lability.  Orders: -     amLODIPine Besylate; Take 1 tablet (10 mg total) by mouth daily. Replaces amlodipine 5, for uncontrolled blood pressure.  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 Cherlynn Kaiser prescribed last week- considered switching to long term medication but she will have the surgery soon so I felt it wouldn't be worth the insurance and medication switching risks at this point.  Orders: -     HYDROcodone-Acetaminophen; Take 1 tablet by mouth every 6 (six) hours as needed for up to 7 days.  Dispense: 28 tablet;  Refill: 0  Anxiety disorder, unspecified type  Anxiety Assessment & Plan: Patient reports this has been flaring recently, but would prefer to wait and discuss with DrMarland Kitchen Cherlynn Kaiser next week I offered medication but she prefer hold off for now     Today's key discussion points and After Visit Summary (AVS) reminders. Problem-associated medical records were reviewed with her during the appointment Common side effects, risks, benefits, and alternatives for medications and treatment plan prescribed today were discussed Review of this encounter document for accuracy and understanding is encouraged in the AVS. Follow up recommended:  No follow-ups on file. Risks and benefits of prescribed controlled substance use were discussed in detail.  she was made aware of risks of addiction, impaired motor function, mixing sedatives.    Subjective - Clinical Presentation:   Laurie Roman is a 47 y.o. female  Patient Active Problem List   Diagnosis Date Noted   Chronic dental pain 06/29/2022   Cervicalgia 03/27/2022   Depression, major, single episode, severe ([place]) 05/16/2020   Primary insomnia 09/15/2019   Anxiety    Maxillary sinusitis 12/02/2018   Asthma 03/27/2018   Controlled substance agreement signed 10/13/2017   Migraine 08/21/2017   ADHD 08/21/2017   Essential hypertension 08/21/2017   Mild obstructive sleep apnea 02/18/2017   Left sided sciatica 09/27/2015   Gastroesophageal reflux disease without esophagitis 05/02/2014   Past Medical History:  Diagnosis Date   ADD (attention deficit disorder)    Anxiety    Asthma    Blood transfusion without reported diagnosis    Depression  GERD (gastroesophageal reflux disease)    IBS (irritable bowel syndrome)    Insomnia    Lumbago    Migraine    Sciatica    Sleep apnea    Tietze's disease     Outpatient Medications Prior to Visit  Medication Sig   albuterol (VENTOLIN HFA) 108 (90 Base) MCG/ACT inhaler Inhale 2 puffs into the lungs  every 6 (six) hours as needed for wheezing or shortness of breath.   amoxicillin (AMOXIL) 500 MG capsule Take 1 capsule (500 mg total) by mouth 3 (three) times daily for 10 days.   amphetamine-dextroamphetamine (ADDERALL) 20 MG tablet Take 1 tablet (20 mg total) by mouth 2 (two) times daily.   budesonide-formoterol (SYMBICORT) 160-4.5 MCG/ACT inhaler Inhale 2 puffs into the lungs 2 (two) times daily.   Erenumab-aooe (AIMOVIG) 140 MG/ML SOAJ Inject 140 mg into the skin every 30 (thirty) days.   fluconazole (DIFLUCAN) 150 MG tablet Take 1 tablet (150 mg total) by mouth every three (3) days as needed.   fluticasone (FLONASE) 50 MCG/ACT nasal spray Place 1 spray into both nostrils daily.   gabapentin (NEURONTIN) 600 MG tablet Take 2 tablets (1,200 mg total) by mouth 2 (two) times daily.   losartan (COZAAR) 100 MG tablet Take 1 tablet (100 mg total) by mouth daily.   meloxicam (MOBIC) 15 MG tablet Take 1 tablet (15 mg total) by mouth daily.   nystatin (MYCOSTATIN) 100000 UNIT/ML suspension Take 5 mLs (500,000 Units total) by mouth 4 (four) times daily. Swish and hold in mouth few seconds and swallow   pantoprazole (PROTONIX) 40 MG tablet Take 1 tablet (40 mg total) by mouth daily.   prazosin (MINIPRESS) 1 MG capsule Take 1 capsule (1 mg total) by mouth at bedtime.   rizatriptan (MAXALT) 10 MG tablet Take 1 tablet (10 mg total) by mouth as needed for migraine. May repeat in 2 hours if needed   tiZANidine (ZANAFLEX) 4 MG tablet Take 1 tablet (4 mg total) by mouth every 6 (six) hours as needed for muscle spasms.   [DISCONTINUED] amLODipine (NORVASC) 5 MG tablet Take 1 tablet (5 mg total) by mouth daily.   [DISCONTINUED] HYDROcodone-acetaminophen (NORCO) 10-325 MG tablet Take 1 tablet by mouth every 6 (six) hours as needed.   No facility-administered medications prior to visit.    Chief Complaint  Patient presents with   Hypertension   Mouth hurting    HPI   For blood pressure, was taking losartan  50 and amlodipine 5 Recently increased to 100 losartan patient reports compliance... she doesn't have blood pressure cuff to recheck now but reports volatility and high blood pressure and home causing flushing of cheeks Blood pressure maybe High due to severe dental pain, 175 blood pressure at dentist in November, oral surgeon to repair Jul 20, 2021- planned to pull R lower molar that fractured but they delayed surgery On antibiotic(s) since November, amoxicillin now.  Denies yeast infection due to weekly diflucan She reports she can get purulence to drain from persistent R molar infection Patient reports tooth is broke in 3 pieces. She reports some anxiety lately        Objective:  Physical Exam  BP (!) 181/110 (BP Location: Left Wrist, Patient Position: Sitting) Comment: taken at home  Overweight  by BMI criteria but truncal adiposity (waist circumference or caliper) should be used instead. Wt Readings from Last 10 Encounters:  05/22/22 138 lb 6 oz (62.8 kg)  05/11/22 138 lb (62.6 kg)  04/10/22 144 lb  8 oz (65.5 kg)  03/27/22 140 lb 4 oz (63.6 kg)  10/17/21 138 lb (62.6 kg)  06/05/21 142 lb (64.4 kg)  03/19/21 156 lb (70.8 kg)  12/12/20 156 lb (70.8 kg)  05/16/20 159 lb 9.6 oz (72.4 kg)  01/01/20 152 lb (68.9 kg)   Vital signs reviewed.  Nursing notes reviewed. Weight trend reviewed. General Appearance:  Well developed, well nourished female in no acute distress.   Normal work of breathing at rest Musculoskeletal: All extremities are intact.  Neurological:  Awake, alert,  No obvious focal neurological deficits or cognitive impairments Psychiatric:  Appropriate mood, pleasant demeanor Problem-specific findings:  patient in bed, at home,  right facial swelling, cheeks a little red.    Results Reviewed: No results found for any visits on 06/29/22.  Recent Results (from the past 2160 hour(s))  CBC with Differential/Platelet     Status: Abnormal   Collection Time: 04/10/22  4:13  PM  Result Value Ref Range   WBC 9.2 3.8 - 10.8 Thousand/uL   RBC 3.79 (L) 3.80 - 5.10 Million/uL   Hemoglobin 11.5 (L) 11.7 - 15.5 g/dL   HCT 35.1 35.0 - 45.0 %   MCV 92.6 80.0 - 100.0 fL   MCH 30.3 27.0 - 33.0 pg   MCHC 32.8 32.0 - 36.0 g/dL   RDW 11.9 11.0 - 15.0 %   Platelets 334 140 - 400 Thousand/uL   MPV 11.4 7.5 - 12.5 fL   Neutro Abs 4,122 1,500 - 7,800 cells/uL   Lymphs Abs 3,892 850 - 3,900 cells/uL   Absolute Monocytes 764 200 - 950 cells/uL   Eosinophils Absolute 331 15 - 500 cells/uL   Basophils Absolute 92 0 - 200 cells/uL   Neutrophils Relative % 44.8 %   Total Lymphocyte 42.3 %   Monocytes Relative 8.3 %   Eosinophils Relative 3.6 %   Basophils Relative 1.0 %  TSH     Status: None   Collection Time: 04/10/22  4:13 PM  Result Value Ref Range   TSH 1.10 mIU/L    Comment:           Reference Range .           > or = 20 Years  0.40-4.50 .                Pregnancy Ranges           First trimester    0.26-2.66           Second trimester   0.55-2.73           Third trimester    0.43-2.91   Comprehensive metabolic panel     Status: None   Collection Time: 04/10/22  4:13 PM  Result Value Ref Range   Glucose, Bld 89 65 - 99 mg/dL    Comment: .            Fasting reference interval .    BUN 15 7 - 25 mg/dL   Creat 0.69 0.50 - 0.99 mg/dL   BUN/Creatinine Ratio SEE NOTE: 6 - 22 (calc)    Comment:    Not Reported: BUN and Creatinine are within    reference range. .    Sodium 141 135 - 146 mmol/L   Potassium 4.9 3.5 - 5.3 mmol/L   Chloride 102 98 - 110 mmol/L   CO2 28 20 - 32 mmol/L   Calcium 9.4 8.6 - 10.2 mg/dL   Total Protein 7.0 6.1 - 8.1  g/dL   Albumin 4.3 3.6 - 5.1 g/dL   Globulin 2.7 1.9 - 3.7 g/dL (calc)   AG Ratio 1.6 1.0 - 2.5 (calc)   Total Bilirubin 0.2 0.2 - 1.2 mg/dL   Alkaline phosphatase (APISO) 62 31 - 125 U/L   AST 16 10 - 35 U/L   ALT 10 6 - 29 U/L  Lipid panel     Status: Abnormal   Collection Time: 04/10/22  4:13 PM  Result Value  Ref Range   Cholesterol 235 (H) <200 mg/dL   HDL 76 > OR = 50 mg/dL   Triglycerides 95 <150 mg/dL   LDL Cholesterol (Calc) 139 (H) mg/dL (calc)    Comment: Reference range: <100 . Desirable range <100 mg/dL for primary prevention;   <70 mg/dL for patients with CHD or diabetic patients  with > or = 2 CHD risk factors. Marland Kitchen LDL-C is now calculated using the Martin-Hopkins  calculation, which is a validated novel method providing  better accuracy than the Friedewald equation in the  estimation of LDL-C.  Cresenciano Genre et al. Annamaria Helling. [id number]): 2061-2068  (http://education.[facility].com/faq/FAQ164)    Total CHOL/HDL Ratio 3.1 <5.0 (calc)   Non-HDL Cholesterol (Calc) 159 (H) <130 mg/dL (calc)    Comment: For patients with diabetes plus 1 major ASCVD risk  factor, treating to a non-HDL-C goal of <100 mg/dL  (LDL-C of <70 mg/dL) is considered a therapeutic  option.   Hemoglobin A1c     Status: None   Collection Time: 04/10/22  4:13 PM  Result Value Ref Range   Hgb A1c MFr Bld 5.6 <5.7 % of total Hgb    Comment: For the purpose of screening for the presence of diabetes: . <5.7%       Consistent with the absence of diabetes 5.7-6.4%    Consistent with increased risk for diabetes             (prediabetes) > or =6.5%  Consistent with diabetes . This assay result is consistent with a decreased risk of diabetes. . Currently, no consensus exists regarding use of hemoglobin A1c for diagnosis of diabetes in children. . According to American Diabetes Association (ADA) guidelines, hemoglobin A1c <7.0% represents optimal control in non-pregnant diabetic patients. Different metrics may apply to specific patient populations.  Standards of Medical Care in Diabetes(ADA). .    Mean Plasma Glucose 114 mg/dL   eAG (mmol/L) 6.3 mmol/L  Iron, Total/Total Iron Binding Cap     Status: Abnormal   Collection Time: 04/10/22  4:13 PM  Result Value Ref Range   Iron 55 40 - 190 mcg/dL   TIBC 384  250 - 450 mcg/dL (calc)   %SAT 14 (L) 16 - 45 % (calc)  Vitamin B12     Status: None   Collection Time: 04/10/22  4:13 PM  Result Value Ref Range   Vitamin B-12 379 200 - 1,100 pg/mL    Comment: . Please Note: Although the reference range for vitamin B12 is [phone] pg/mL, it has been reported that between 5 and 10% of patients with values between 200 and 400 pg/mL may experience neuropsychiatric and hematologic abnormalities due to occult B12 deficiency; less than 1% of patients with values above 400 pg/mL will have symptoms. .   TEST AUTHORIZATION     Status: None   Collection Time: 04/10/22  4:13 PM  Result Value Ref Range   TEST NAME: IRON AND TOTAL IRON VITAMIN B    TEST CODE: [id number]  CLIENT CONTACT: KATIE NELSON    REPORT ALWAYS MESSAGE SIGNATURE      Comment: . The laboratory testing on this patient was verbally requested or confirmed by the ordering physician or his or her authorized representative after contact with an employee of [employer]. Federal regulations require that we maintain on file written authorization for all laboratory testing.  Accordingly we are asking that the ordering physician or his or her authorized representative sign a copy of this report and promptly return it to the client service representative. . . Signature:____________________________________________________ . Please fax this signed page to [phone] or return it via your [employer] courier.   CBC with Differential/Platelet     Status: Abnormal   Collection Time: 05/13/22 10:23 AM  Result Value Ref Range   WBC 12.1 (H) 4.0 - 10.5 K/uL   RBC 4.04 3.87 - 5.11 Mil/uL   Hemoglobin 12.6 12.0 - 15.0 g/dL   HCT 32.3 55.7 - 32.2 %   MCV 92.2 78.0 - 100.0 fl   MCHC 33.8 30.0 - 36.0 g/dL   RDW 02.5 42.7 - 06.2 %   Platelets 398.0 150.0 - 400.0 K/uL   Neutrophils Relative % 63.9 43.0 - 77.0 %   Lymphocytes Relative 24.9 12.0 - 46.0 %   Monocytes Relative 7.9  3.0 - 12.0 %   Eosinophils Relative 2.2 0.0 - 5.0 %   Basophils Relative 1.1 0.0 - 3.0 %   Neutro Abs 7.7 1.4 - 7.7 K/uL   Lymphs Abs 3.0 0.7 - 4.0 K/uL   Monocytes Absolute 1.0 0.1 - 1.0 K/uL   Eosinophils Absolute 0.3 0.0 - 0.7 K/uL   Basophils Absolute 0.1 0.0 - 0.1 K/uL  B12     Status: None   Collection Time: 05/13/22 10:23 AM  Result Value Ref Range   Vitamin B-12 236 211 - 911 pg/mL  Iron, TIBC and Ferritin Panel     Status: None   Collection Time: 05/13/22 10:23 AM  Result Value Ref Range   Iron 63 40 - 190 mcg/dL   TIBC 376 283 - 151 mcg/dL (calc)   %SAT 17 16 - 45 % (calc)   Ferritin 48 16 - 232 ng/mL  Urine cytology ancillary only([place])     Status: Abnormal   Collection Time: 05/13/22 10:23 AM  Result Value Ref Range   Neisseria Gonorrhea Negative    Chlamydia Negative    Trichomonas Negative    Bacterial Vaginitis-Urine Negative    Candida Urine Positive (A)    Candida Urine Negative (A)    Molecular Comment      For tests bacteria and/or candida, this specimen does not meet the   Molecular Comment      strict criteria set by the FDA. The result interpretation should be   Molecular Comment      considered in conjunction with the patient's clinical history.   Comment Normal Reference Ranger Chlamydia - Negative    Comment      Normal Reference Range Neisseria Gonorrhea - Negative   Comment Normal Reference Range Trichomonas - Negative           Signed: [name], MD 06/29/2022 8:34 PM

## 2022-06-30 ENCOUNTER — Ambulatory Visit (INDEPENDENT_AMBULATORY_CARE_PROVIDER_SITE_OTHER): Payer: Medicaid Other

## 2022-06-30 ENCOUNTER — Encounter: Payer: Self-pay | Admitting: Family Medicine

## 2022-06-30 DIAGNOSIS — E538 Deficiency of other specified B group vitamins: Secondary | ICD-10-CM

## 2022-06-30 MED ORDER — CYANOCOBALAMIN 1000 MCG/ML IJ SOLN
1000.0000 ug | Freq: Once | INTRAMUSCULAR | Status: AC
  Administered 2022-06-30: 1000 ug via INTRAMUSCULAR

## 2022-06-30 NOTE — Progress Notes (Unsigned)
Patient not feeling well, wanted blood pressure checked.

## 2022-06-30 NOTE — Progress Notes (Signed)
Laurie Roman 38 yd old female presents to office today for 4th of 4 weekly B12 injections per Esther Hardy, MD. Administered CYANOCOBALAMIN 1,000 mcg IM left arm. Patient tolerated well. Aware that B12 will be checked in one month prior to monthly b12 injection.

## 2022-07-01 ENCOUNTER — Other Ambulatory Visit (HOSPITAL_BASED_OUTPATIENT_CLINIC_OR_DEPARTMENT_OTHER): Payer: Self-pay

## 2022-07-04 ENCOUNTER — Other Ambulatory Visit (HOSPITAL_BASED_OUTPATIENT_CLINIC_OR_DEPARTMENT_OTHER): Payer: Self-pay

## 2022-07-07 ENCOUNTER — Ambulatory Visit (INDEPENDENT_AMBULATORY_CARE_PROVIDER_SITE_OTHER): Payer: Medicaid Other | Admitting: Internal Medicine

## 2022-07-07 ENCOUNTER — Other Ambulatory Visit (HOSPITAL_BASED_OUTPATIENT_CLINIC_OR_DEPARTMENT_OTHER): Payer: Self-pay

## 2022-07-07 ENCOUNTER — Encounter: Payer: Self-pay | Admitting: Internal Medicine

## 2022-07-07 VITALS — BP 157/107 | HR 97 | Temp 97.7°F | Ht 59.0 in | Wt 134.2 lb

## 2022-07-07 DIAGNOSIS — R110 Nausea: Secondary | ICD-10-CM | POA: Diagnosis not present

## 2022-07-07 DIAGNOSIS — K047 Periapical abscess without sinus: Secondary | ICD-10-CM

## 2022-07-07 DIAGNOSIS — K0889 Other specified disorders of teeth and supporting structures: Secondary | ICD-10-CM | POA: Diagnosis not present

## 2022-07-07 MED ORDER — HYDROCODONE-ACETAMINOPHEN 10-325 MG PO TABS
1.0000 | ORAL_TABLET | Freq: Four times a day (QID) | ORAL | 0 refills | Status: DC | PRN
  Filled 2022-07-07: qty 28, 7d supply, fill #0

## 2022-07-07 MED ORDER — CHLORHEXIDINE GLUCONATE 0.12 % MT SOLN
15.0000 mL | Freq: Two times a day (BID) | OROMUCOSAL | 0 refills | Status: DC
  Filled 2022-07-07: qty 946, 32d supply, fill #0

## 2022-07-07 MED ORDER — SCOPOLAMINE 1 MG/3DAYS TD PT72: 1.0000 | MEDICATED_PATCH | TRANSDERMAL | 12 refills | Status: DC

## 2022-07-07 MED ORDER — AMOXICILLIN 875 MG PO TABS
875.0000 mg | ORAL_TABLET | Freq: Two times a day (BID) | ORAL | 0 refills | Status: AC
  Filled 2022-07-07: qty 20, 10d supply, fill #0

## 2022-07-07 MED ORDER — KETOROLAC TROMETHAMINE 60 MG/2ML IM SOLN
60.0000 mg | Freq: Once | INTRAMUSCULAR | Status: AC
  Administered 2022-07-07: 60 mg via INTRAMUSCULAR

## 2022-07-07 NOTE — Progress Notes (Signed)
[facility]: [phone]   Routine Medical Office Visit  Patient:  Laurie Roman      Age: 47 y.o.       Sex:  female  Date:   07/07/2022  PCP:    Jeani Sow, MD   Today's Healthcare Provider: [name], MD   Problem Focused Charting:   Medical Decision Making per Assessment/Plan   Jernie was seen today for dental pain.  Dental infection -     Amoxicillin; Take 1 tablet (875 mg total) by mouth 2 (two) times daily for 10 days.  Dispense: 20 tablet; Refill: 0 -     Ketorolac Tromethamine -     Chlorhexidine Gluconate; Use as directed 15 mLs in the mouth or throat 2 (two) times daily.  Dispense: 1893 mL; Refill: 0 -     HYDROcodone-Acetaminophen; Take 1 tablet by mouth every 6 (six) hours as needed for up to 7 days.  Dispense: 28 tablet; Refill: 0  Pain, dental  Nausea -     Scopolamine; Place 1 patch (1.5 mg total) onto the skin every 3 (three) days.  Dispense: 10 patch; Refill: 12   Risks and benefits persistent antibiotic(s) discussed and she will take probiotics. PDMP reviewed during this encounter. To follow up with Dr. Ruthine Dose Primary Care Provider (PCP) - sharing note she has seen me last couple of visits just due to logistics/appointment availability     Subjective - Clinical Presentation:   Laurie Roman is a 47 y.o. female  Patient Active Problem List   Diagnosis Date Noted   Chronic dental pain 06/29/2022   Cervicalgia 03/27/2022   Depression, major, single episode, severe (HCC) 05/16/2020   Primary insomnia 09/15/2019   Anxiety    Maxillary sinusitis 12/02/2018   Asthma 03/27/2018   Controlled substance agreement signed 10/13/2017   Migraine 08/21/2017   ADHD 08/21/2017   Essential hypertension 08/21/2017   Mild obstructive sleep apnea 02/18/2017   Left sided sciatica 09/27/2015   Gastroesophageal reflux disease without esophagitis 05/02/2014   Past Medical History:  Diagnosis Date   ADD (attention deficit disorder)     Anxiety    Asthma    Blood transfusion without reported diagnosis    Depression    GERD (gastroesophageal reflux disease)    IBS (irritable bowel syndrome)    Insomnia    Lumbago    Migraine    Sciatica    Sleep apnea    Tietze's disease     Outpatient Medications Prior to Visit  Medication Sig   albuterol (VENTOLIN HFA) 108 (90 Base) MCG/ACT inhaler Inhale 2 puffs into the lungs every 6 (six) hours as needed for wheezing or shortness of breath.   amLODipine (NORVASC) 10 MG tablet Take 1 tablet (10 mg total) by mouth daily. Replaces amlodipine 5, for uncontrolled blood pressure.   budesonide-formoterol (SYMBICORT) 160-4.5 MCG/ACT inhaler Inhale 2 puffs into the lungs 2 (two) times daily.   Erenumab-aooe (AIMOVIG) 140 MG/ML SOAJ Inject 140 mg into the skin every 30 (thirty) days.   fluconazole (DIFLUCAN) 150 MG tablet Take 1 tablet (150 mg total) by mouth every three (3) days as needed.   fluticasone (FLONASE) 50 MCG/ACT nasal spray Place 1 spray into both nostrils daily.   gabapentin (NEURONTIN) 600 MG tablet Take 2 tablets (1,200 mg total) by mouth 2 (two) times daily.   losartan (COZAAR) 100 MG tablet Take 1 tablet (100 mg total) by mouth daily.   meloxicam (MOBIC) 15 MG tablet  Take 1 tablet (15 mg total) by mouth daily.   nystatin (MYCOSTATIN) 100000 UNIT/ML suspension Take 5 mLs (500,000 Units total) by mouth 4 (four) times daily. Swish and hold in mouth few seconds and swallow   pantoprazole (PROTONIX) 40 MG tablet Take 1 tablet (40 mg total) by mouth daily.   prazosin (MINIPRESS) 1 MG capsule Take 1 capsule (1 mg total) by mouth at bedtime.   rizatriptan (MAXALT) 10 MG tablet Take 1 tablet (10 mg total) by mouth as needed for migraine. May repeat in 2 hours if needed   tiZANidine (ZANAFLEX) 4 MG tablet Take 1 tablet (4 mg total) by mouth every 6 (six) hours as needed for muscle spasms.   amphetamine-dextroamphetamine (ADDERALL) 20 MG tablet Take 1 tablet (20 mg total) by mouth 2  (two) times daily.   No facility-administered medications prior to visit.    Chief Complaint  Patient presents with   Dental Pain    HPI  Reports 3 days of worsening tenderness in her tooth socket with a broken tooth for which she has had extensive antibiotics and pain medication due to delays in getting oral surgery which should be coming up in couple weeks. She also reports that she just feels really bad all over her body aches and pains and malaise for 2 days He denies any fevers She has been alternating between amoxicillin and clindamycin for the treatment of the recurring infection in the meantime and has been on antibiotics for months but has no diarrhea at this time although her daughter does have C. difficile and she is aware of the risk Her last hydrocodone script ran out yesterday and she request refills and toradol shot today.        Objective:  Physical Exam  BP (!) 157/107   Pulse 97   Temp 97.7 F (36.5 C)   Ht 4\' 11"  (1.499 m)   Wt 134 lb 3.2 oz (60.9 kg)   SpO2 99%   BMI 27.11 kg/m   Overweight  by BMI criteria but truncal adiposity (waist circumference or caliper) should be used instead. Wt Readings from Last 10 Encounters:  07/07/22 134 lb 3.2 oz (60.9 kg)  05/22/22 138 lb 6 oz (62.8 kg)  05/11/22 138 lb (62.6 kg)  04/10/22 144 lb 8 oz (65.5 kg)  03/27/22 140 lb 4 oz (63.6 kg)  10/17/21 138 lb (62.6 kg)  06/05/21 142 lb (64.4 kg)  03/19/21 156 lb (70.8 kg)  12/12/20 156 lb (70.8 kg)  05/16/20 159 lb 9.6 oz (72.4 kg)   Vital signs reviewed.  Nursing notes reviewed. Weight trend reviewed. General Appearance:  Well developed, well nourished female in no acute distress.   Normal work of breathing at rest Musculoskeletal: All extremities are intact.  Neurological:  Awake, alert,  No obvious focal neurological deficits or cognitive impairments Psychiatric:  Appropriate mood, pleasant demeanor Problem-specific findings:  she has erupting broken tooth right  lower front molar is essentiall a socket now with severe tenderness of the gums around the socket, no dental tenderness for nearby teeth   Results Reviewed: No results found for any visits on 07/07/22.  Recent Results (from the past 2160 hour(s))  CBC with Differential/Platelet     Status: Abnormal   Collection Time: 04/10/22  4:13 PM  Result Value Ref Range   WBC 9.2 3.8 - 10.8 Thousand/uL   RBC 3.79 (L) 3.80 - 5.10 Million/uL   Hemoglobin 11.5 (L) 11.7 - 15.5 g/dL   HCT 35.1 35.0 -  45.0 %   MCV 92.6 80.0 - 100.0 fL   MCH 30.3 27.0 - 33.0 pg   MCHC 32.8 32.0 - 36.0 g/dL   RDW 11.9 11.0 - 15.0 %   Platelets 334 140 - 400 Thousand/uL   MPV 11.4 7.5 - 12.5 fL   Neutro Abs 4,122 1,500 - 7,800 cells/uL   Lymphs Abs 3,892 850 - 3,900 cells/uL   Absolute Monocytes 764 200 - 950 cells/uL   Eosinophils Absolute 331 15 - 500 cells/uL   Basophils Absolute 92 0 - 200 cells/uL   Neutrophils Relative % 44.8 %   Total Lymphocyte 42.3 %   Monocytes Relative 8.3 %   Eosinophils Relative 3.6 %   Basophils Relative 1.0 %  TSH     Status: None   Collection Time: 04/10/22  4:13 PM  Result Value Ref Range   TSH 1.10 mIU/L    Comment:           Reference Range .           > or = 20 Years  0.40-4.50 .                Pregnancy Ranges           First trimester    0.26-2.66           Second trimester   0.55-2.73           Third trimester    0.43-2.91   Comprehensive metabolic panel     Status: None   Collection Time: 04/10/22  4:13 PM  Result Value Ref Range   Glucose, Bld 89 65 - 99 mg/dL    Comment: .            Fasting reference interval .    BUN 15 7 - 25 mg/dL   Creat 0.69 0.50 - 0.99 mg/dL   BUN/Creatinine Ratio SEE NOTE: 6 - 22 (calc)    Comment:    Not Reported: BUN and Creatinine are within    reference range. .    Sodium 141 135 - 146 mmol/L   Potassium 4.9 3.5 - 5.3 mmol/L   Chloride 102 98 - 110 mmol/L   CO2 28 20 - 32 mmol/L   Calcium 9.4 8.6 - 10.2 mg/dL   Total  Protein 7.0 6.1 - 8.1 g/dL   Albumin 4.3 3.6 - 5.1 g/dL   Globulin 2.7 1.9 - 3.7 g/dL (calc)   AG Ratio 1.6 1.0 - 2.5 (calc)   Total Bilirubin 0.2 0.2 - 1.2 mg/dL   Alkaline phosphatase (APISO) 62 31 - 125 U/L   AST 16 10 - 35 U/L   ALT 10 6 - 29 U/L  Lipid panel     Status: Abnormal   Collection Time: 04/10/22  4:13 PM  Result Value Ref Range   Cholesterol 235 (H) <200 mg/dL   HDL 76 > OR = 50 mg/dL   Triglycerides 95 <150 mg/dL   LDL Cholesterol (Calc) 139 (H) mg/dL (calc)    Comment: Reference range: <100 . Desirable range <100 mg/dL for primary prevention;   <70 mg/dL for patients with CHD or diabetic patients  with > or = 2 CHD risk factors. Marland Kitchen LDL-C is now calculated using the Martin-Hopkins  calculation, which is a validated novel method providing  better accuracy than the Friedewald equation in the  estimation of LDL-C.  Cresenciano Genre et al. Annamaria Helling. 3151;761(60): 2061-2068  (http://education.[employer].com/faq/FAQ164)    Total CHOL/HDL Ratio 3.1 <5.0 (calc)  Non-HDL Cholesterol (Calc) 159 (H) <130 mg/dL (calc)    Comment: For patients with diabetes plus 1 major ASCVD risk  factor, treating to a non-HDL-C goal of <100 mg/dL  (LDL-C of <70 mg/dL) is considered a therapeutic  option.   Hemoglobin A1c     Status: None   Collection Time: 04/10/22  4:13 PM  Result Value Ref Range   Hgb A1c MFr Bld 5.6 <5.7 % of total Hgb    Comment: For the purpose of screening for the presence of diabetes: . <5.7%       Consistent with the absence of diabetes 5.7-6.4%    Consistent with increased risk for diabetes             (prediabetes) > or =6.5%  Consistent with diabetes . This assay result is consistent with a decreased risk of diabetes. . Currently, no consensus exists regarding use of hemoglobin A1c for diagnosis of diabetes in children. . According to American Diabetes Association (ADA) guidelines, hemoglobin A1c <7.0% represents optimal control in non-pregnant  diabetic patients. Different metrics may apply to specific patient populations.  Standards of Medical Care in Diabetes(ADA). .    Mean Plasma Glucose 114 mg/dL   eAG (mmol/L) 6.3 mmol/L  Iron, Total/Total Iron Binding Cap     Status: Abnormal   Collection Time: 04/10/22  4:13 PM  Result Value Ref Range   Iron 55 40 - 190 mcg/dL   TIBC 384 250 - 450 mcg/dL (calc)   %SAT 14 (L) 16 - 45 % (calc)  Vitamin B12     Status: None   Collection Time: 04/10/22  4:13 PM  Result Value Ref Range   Vitamin B-12 379 200 - 1,100 pg/mL    Comment: . Please Note: Although the reference range for vitamin B12 is [phone] pg/mL, it has been reported that between 5 and 10% of patients with values between 200 and 400 pg/mL may experience neuropsychiatric and hematologic abnormalities due to occult B12 deficiency; less than 1% of patients with values above 400 pg/mL will have symptoms. .   TEST AUTHORIZATION     Status: None   Collection Time: 04/10/22  4:13 PM  Result Value Ref Range   TEST NAME: IRON AND TOTAL IRON VITAMIN B    TEST CODE: [id number]    CLIENT CONTACT: KATIE NELSON    REPORT ALWAYS MESSAGE SIGNATURE      Comment: . The laboratory testing on this patient was verbally requested or confirmed by the ordering physician or his or her authorized representative after contact with an employee of [employer]. Federal regulations require that we maintain on file written authorization for all laboratory testing.  Accordingly we are asking that the ordering physician or his or her authorized representative sign a copy of this report and promptly return it to the client service representative. . . Signature:____________________________________________________ . Please fax this signed page to [phone] or return it via your [employer] courier.   CBC with Differential/Platelet     Status: Abnormal   Collection Time: 05/13/22 10:23 AM  Result Value Ref Range   WBC  12.1 (H) 4.0 - 10.5 K/uL   RBC 4.04 3.87 - 5.11 Mil/uL   Hemoglobin 12.6 12.0 - 15.0 g/dL   HCT 37.2 36.0 - 46.0 %   MCV 92.2 78.0 - 100.0 fl   MCHC 33.8 30.0 - 36.0 g/dL   RDW 13.7 11.5 - 15.5 %   Platelets 398.0 150.0 - 400.0 K/uL   Neutrophils  Relative % 63.9 43.0 - 77.0 %   Lymphocytes Relative 24.9 12.0 - 46.0 %   Monocytes Relative 7.9 3.0 - 12.0 %   Eosinophils Relative 2.2 0.0 - 5.0 %   Basophils Relative 1.1 0.0 - 3.0 %   Neutro Abs 7.7 1.4 - 7.7 K/uL   Lymphs Abs 3.0 0.7 - 4.0 K/uL   Monocytes Absolute 1.0 0.1 - 1.0 K/uL   Eosinophils Absolute 0.3 0.0 - 0.7 K/uL   Basophils Absolute 0.1 0.0 - 0.1 K/uL  B12     Status: None   Collection Time: 05/13/22 10:23 AM  Result Value Ref Range   Vitamin B-12 236 211 - 911 pg/mL  Iron, TIBC and Ferritin Panel     Status: None   Collection Time: 05/13/22 10:23 AM  Result Value Ref Range   Iron 63 40 - 190 mcg/dL   TIBC 993 716 - 967 mcg/dL (calc)   %SAT 17 16 - 45 % (calc)   Ferritin 48 16 - 232 ng/mL  Urine cytology ancillary only([place])     Status: Abnormal   Collection Time: 05/13/22 10:23 AM  Result Value Ref Range   Neisseria Gonorrhea Negative    Chlamydia Negative    Trichomonas Negative    Bacterial Vaginitis-Urine Negative    Candida Urine Positive (A)    Candida Urine Negative (A)    Molecular Comment      For tests bacteria and/or candida, this specimen does not meet the   Molecular Comment      strict criteria set by the FDA. The result interpretation should be   Molecular Comment      considered in conjunction with the patient's clinical history.   Comment Normal Reference Ranger Chlamydia - Negative    Comment      Normal Reference Range Neisseria Gonorrhea - Negative   Comment Normal Reference Range Trichomonas - Negative           Signed: [name], MD 07/07/2022 6:36 PM

## 2022-07-10 ENCOUNTER — Other Ambulatory Visit: Payer: Self-pay | Admitting: Family Medicine

## 2022-07-10 DIAGNOSIS — F902 Attention-deficit hyperactivity disorder, combined type: Secondary | ICD-10-CM

## 2022-07-11 MED ORDER — AMPHETAMINE-DEXTROAMPHETAMINE 20 MG PO TABS: 20.0000 mg | ORAL_TABLET | Freq: Two times a day (BID) | ORAL | 0 refills | Status: DC

## 2022-07-13 ENCOUNTER — Other Ambulatory Visit: Payer: Self-pay

## 2022-07-13 ENCOUNTER — Encounter: Payer: Self-pay | Admitting: Internal Medicine

## 2022-07-13 ENCOUNTER — Ambulatory Visit (INDEPENDENT_AMBULATORY_CARE_PROVIDER_SITE_OTHER): Payer: Medicaid Other | Admitting: Internal Medicine

## 2022-07-13 ENCOUNTER — Other Ambulatory Visit: Payer: Self-pay | Admitting: Nurse Practitioner

## 2022-07-13 VITALS — BP 138/80 | HR 92 | Temp 98.4°F | Ht 59.0 in | Wt 136.0 lb

## 2022-07-13 DIAGNOSIS — F1120 Opioid dependence, uncomplicated: Secondary | ICD-10-CM

## 2022-07-13 DIAGNOSIS — K089 Disorder of teeth and supporting structures, unspecified: Secondary | ICD-10-CM

## 2022-07-13 DIAGNOSIS — K047 Periapical abscess without sinus: Secondary | ICD-10-CM

## 2022-07-13 DIAGNOSIS — K0889 Other specified disorders of teeth and supporting structures: Secondary | ICD-10-CM | POA: Diagnosis not present

## 2022-07-13 DIAGNOSIS — G8929 Other chronic pain: Secondary | ICD-10-CM

## 2022-07-13 MED ORDER — HYDROCODONE-ACETAMINOPHEN 10-325 MG PO TABS: 1.0000 | ORAL_TABLET | Freq: Four times a day (QID) | ORAL | 0 refills | Status: DC | PRN

## 2022-07-13 NOTE — Telephone Encounter (Signed)
Last refill: 07/07/22 #28, 0 Last OV: 07/07/22 dx. Dental infection

## 2022-07-13 NOTE — Progress Notes (Signed)
[facility]: [phone]   Routine Medical Office Visit  Patient:  Laurie Roman      Age: 47 y.o.       Sex:  female  Date:   07/13/2022  PCP:    Tawnya Crook, MD   [place] Provider: Loralee Pacas, MD   Problem Focused Charting:   Medical Decision Making per Assessment/Plan   Pain, dental  Chronic dental pain  Dental infection -     HYDROcodone-Acetaminophen; Take 1 tablet by mouth every 6 (six) hours as needed for up to 7 days. Reduce dose as tolerated.  Only take for severe pain  Dispense: 28 tablet; Refill: 0  Uncomplicated opioid dependence ([place])    She reports her pain has improved, but she wants to continue hydrocodone at same dose as prescribed, but feels no need for more antibiotic(s) at this time.  I  The affected tooth looks a lot less tender and inflamed this week, and suspect her fear of me tapering the dose is coming from withdrawal fear, where she has been on hydrocodone for months now awaiting dental procedure..  I opened a conversation about opioid dependence with her, that I think she might have difficulty stopping the medications even after the seemingly perpetually delayed yet upcoming dental surgery resolves the pain fully.  She feels she won't have difficulty as she has stopped once long ago after similar long term dependency.  I encouraged her to see Dr. Cherlynn Kaiser next week and resume discussion of tapering off.   I shared this note to her Primary Care Provider (PCP) for care coordination. I checked prescription drug monitoring program which only shows Dr. Cherlynn Kaiser and me prescribing.     Subjective - Clinical Presentation:   Laurie Roman is a 47 y.o. female  Patient Active Problem List   Diagnosis Date Noted   Chronic dental pain 06/29/2022   Cervicalgia 03/27/2022   Depression, major, single episode, severe ([place]) 05/16/2020   Primary insomnia 09/15/2019   Anxiety    Maxillary sinusitis 12/02/2018   Asthma 03/27/2018    Controlled substance agreement signed 10/13/2017   Migraine 08/21/2017   ADHD 08/21/2017   Essential hypertension 08/21/2017   Mild obstructive sleep apnea 02/18/2017   Left sided sciatica 09/27/2015   Gastroesophageal reflux disease without esophagitis 05/02/2014   Past Medical History:  Diagnosis Date   ADD (attention deficit disorder)    Anxiety    Asthma    Blood transfusion without reported diagnosis    Depression    GERD (gastroesophageal reflux disease)    IBS (irritable bowel syndrome)    Insomnia    Lumbago    Migraine    Sciatica    Sleep apnea    Tietze's disease     Outpatient Medications Prior to Visit  Medication Sig   albuterol (VENTOLIN HFA) 108 (90 Base) MCG/ACT inhaler Inhale 2 puffs into the lungs every 6 (six) hours as needed for wheezing or shortness of breath.   amLODipine (NORVASC) 10 MG tablet Take 1 tablet (10 mg total) by mouth daily. Replaces amlodipine 5, for uncontrolled blood pressure.   amoxicillin (AMOXIL) 875 MG tablet Take 1 tablet (875 mg total) by mouth 2 (two) times daily for 10 days.   amphetamine-dextroamphetamine (ADDERALL) 20 MG tablet Take 1 tablet (20 mg total) by mouth 2 (two) times daily.   budesonide-formoterol (SYMBICORT) 160-4.5 MCG/ACT inhaler Inhale 2 puffs into the lungs 2 (two) times daily.   chlorhexidine (PERIDEX) 0.12 %  solution Use as directed 15 mLs in the mouth or throat 2 (two) times daily.   Erenumab-aooe (AIMOVIG) 140 MG/ML SOAJ Inject 140 mg into the skin every 30 (thirty) days.   fluconazole (DIFLUCAN) 150 MG tablet Take 1 tablet (150 mg total) by mouth every three (3) days as needed.   fluticasone (FLONASE) 50 MCG/ACT nasal spray Place 1 spray into both nostrils daily.   gabapentin (NEURONTIN) 600 MG tablet Take 2 tablets (1,200 mg total) by mouth 2 (two) times daily.   losartan (COZAAR) 100 MG tablet Take 1 tablet (100 mg total) by mouth daily.   meloxicam (MOBIC) 15 MG tablet Take 1 tablet (15 mg total) by mouth  daily.   nystatin (MYCOSTATIN) 100000 UNIT/ML suspension Take 5 mLs (500,000 Units total) by mouth 4 (four) times daily. Swish and hold in mouth few seconds and swallow   pantoprazole (PROTONIX) 40 MG tablet Take 1 tablet (40 mg total) by mouth daily.   prazosin (MINIPRESS) 1 MG capsule Take 1 capsule (1 mg total) by mouth at bedtime.   rizatriptan (MAXALT) 10 MG tablet Take 1 tablet (10 mg total) by mouth as needed for migraine. May repeat in 2 hours if needed   scopolamine (TRANSDERM-SCOP) 1 MG/3DAYS Place 1 patch (1.5 mg total) onto the skin every 3 (three) days.   tiZANidine (ZANAFLEX) 4 MG tablet Take 1 tablet (4 mg total) by mouth every 6 (six) hours as needed for muscle spasms.   [DISCONTINUED] HYDROcodone-acetaminophen (NORCO) 10-325 MG tablet Take 1 tablet by mouth every 6 (six) hours as needed for up to 7 days.   No facility-administered medications prior to visit.    No chief complaint on file.   HPI  Patient reports improvement in her pain, but request norco refills without reduced dose for ongoing dental pain that has not resolved despite repeat antibiotic(s).  Dental care has been delayed for months, she has her appointment in 7 days         Objective:  Physical Exam  BP 138/80   Pulse 92   Temp 98.4 F (36.9 C) (Temporal)   Ht 4\' 11"  (1.499 m)   Wt 136 lb (61.7 kg)   SpO2 99%   BMI 27.47 kg/m   Overweight  by BMI criteria but truncal adiposity (waist circumference or caliper) should be used instead. Wt Readings from Last 10 Encounters:  07/13/22 136 lb (61.7 kg)  07/07/22 134 lb 3.2 oz (60.9 kg)  05/22/22 138 lb 6 oz (62.8 kg)  05/11/22 138 lb (62.6 kg)  04/10/22 144 lb 8 oz (65.5 kg)  03/27/22 140 lb 4 oz (63.6 kg)  10/17/21 138 lb (62.6 kg)  06/05/21 142 lb (64.4 kg)  03/19/21 156 lb (70.8 kg)  12/12/20 156 lb (70.8 kg)   Vital signs reviewed.  Nursing notes reviewed. Weight trend reviewed. General Appearance:  Well developed, well nourished female in no  acute distress.   Normal work of breathing at rest Musculoskeletal: All extremities are intact.  Neurological:  Awake, alert,  No obvious focal neurological deficits or cognitive impairments Psychiatric:  Appropriate mood, pleasant demeanor Problem-specific findings:  minimally tender broken off tooth right lower canine/molar.   Results Reviewed: No results found for any visits on 07/13/22.  Recent Results (from the past 2160 hour(s))  CBC with Differential/Platelet     Status: Abnormal   Collection Time: 05/13/22 10:23 AM  Result Value Ref Range   WBC 12.1 (H) 4.0 - 10.5 K/uL   RBC 4.04 3.87 -  5.11 Mil/uL   Hemoglobin 12.6 12.0 - 15.0 g/dL   HCT 37.2 36.0 - 46.0 %   MCV 92.2 78.0 - 100.0 fl   MCHC 33.8 30.0 - 36.0 g/dL   RDW 13.7 11.5 - 15.5 %   Platelets 398.0 150.0 - 400.0 K/uL   Neutrophils Relative % 63.9 43.0 - 77.0 %   Lymphocytes Relative 24.9 12.0 - 46.0 %   Monocytes Relative 7.9 3.0 - 12.0 %   Eosinophils Relative 2.2 0.0 - 5.0 %   Basophils Relative 1.1 0.0 - 3.0 %   Neutro Abs 7.7 1.4 - 7.7 K/uL   Lymphs Abs 3.0 0.7 - 4.0 K/uL   Monocytes Absolute 1.0 0.1 - 1.0 K/uL   Eosinophils Absolute 0.3 0.0 - 0.7 K/uL   Basophils Absolute 0.1 0.0 - 0.1 K/uL  B12     Status: None   Collection Time: 05/13/22 10:23 AM  Result Value Ref Range   Vitamin B-12 236 211 - 911 pg/mL  Iron, TIBC and Ferritin Panel     Status: None   Collection Time: 05/13/22 10:23 AM  Result Value Ref Range   Iron 63 40 - 190 mcg/dL   TIBC 374 250 - 450 mcg/dL (calc)   %SAT 17 16 - 45 % (calc)   Ferritin 48 16 - 232 ng/mL  Urine cytology ancillary only([place])     Status: Abnormal   Collection Time: 05/13/22 10:23 AM  Result Value Ref Range   Neisseria Gonorrhea Negative    Chlamydia Negative    Trichomonas Negative    Bacterial Vaginitis-Urine Negative    Candida Urine Positive (A)    Candida Urine Negative (A)    Molecular Comment      For tests bacteria and/or candida, this specimen  does not meet the   Molecular Comment      strict criteria set by the FDA. The result interpretation should be   Molecular Comment      considered in conjunction with the patient's clinical history.   Comment Normal Reference Ranger Chlamydia - Negative    Comment      Normal Reference Range Neisseria Gonorrhea - Negative   Comment Normal Reference Range Trichomonas - Negative           Signed: Loralee Pacas, MD 07/13/2022 7:26 PM

## 2022-07-14 ENCOUNTER — Ambulatory Visit: Payer: Medicaid Other | Admitting: Family Medicine

## 2022-07-14 ENCOUNTER — Other Ambulatory Visit: Payer: Self-pay | Admitting: *Deleted

## 2022-07-14 MED ORDER — FLUTICASONE PROPIONATE 50 MCG/ACT NA SUSP: 1.0000 | Freq: Every day | NASAL | 0 refills | Status: DC

## 2022-07-14 MED ORDER — HYDROCODONE-ACETAMINOPHEN 10-325 MG PO TABS: 1.0000 | ORAL_TABLET | Freq: Four times a day (QID) | ORAL | 0 refills | Status: DC | PRN

## 2022-07-14 MED ORDER — ALBUTEROL SULFATE HFA 108 (90 BASE) MCG/ACT IN AERS: 2.0000 | INHALATION_SPRAY | Freq: Four times a day (QID) | RESPIRATORY_TRACT | 1 refills | Status: DC | PRN

## 2022-07-14 NOTE — Addendum Note (Signed)
Addended by: Loralee Pacas on: 07/14/2022 10:09 AM   Modules accepted: Orders

## 2022-07-15 ENCOUNTER — Encounter: Payer: Self-pay | Admitting: Family Medicine

## 2022-07-15 ENCOUNTER — Ambulatory Visit (INDEPENDENT_AMBULATORY_CARE_PROVIDER_SITE_OTHER): Payer: Medicaid Other | Admitting: Family Medicine

## 2022-07-15 VITALS — BP 120/90 | HR 95 | Temp 98.3°F | Ht 59.0 in | Wt 141.4 lb

## 2022-07-15 DIAGNOSIS — G43009 Migraine without aura, not intractable, without status migrainosus: Secondary | ICD-10-CM | POA: Diagnosis not present

## 2022-07-15 DIAGNOSIS — R519 Headache, unspecified: Secondary | ICD-10-CM

## 2022-07-15 DIAGNOSIS — J3489 Other specified disorders of nose and nasal sinuses: Secondary | ICD-10-CM | POA: Diagnosis not present

## 2022-07-15 LAB — POC COVID19 BINAXNOW: SARS Coronavirus 2 Ag: NEGATIVE

## 2022-07-15 LAB — POCT INFLUENZA A/B
Influenza A, POC: NEGATIVE
Influenza B, POC: NEGATIVE

## 2022-07-15 MED ORDER — KETOROLAC TROMETHAMINE 60 MG/2ML IM SOLN
60.0000 mg | Freq: Once | INTRAMUSCULAR | Status: AC
  Administered 2022-07-15: 60 mg via INTRAMUSCULAR

## 2022-07-15 MED ORDER — OSELTAMIVIR PHOSPHATE 75 MG PO CAPS: 75.0000 mg | ORAL_CAPSULE | Freq: Two times a day (BID) | ORAL | 0 refills | Status: DC

## 2022-07-15 NOTE — Progress Notes (Signed)
Subjective:     Patient ID: Laurie Roman, female    DOB: Sep 22, 1975, 47 y.o.   MRN: [id number]  Chief Complaint  Patient presents with   Headache   Hoarse    Sx started last night   Flu Exposure   Sinus Problem    HPI  Since last pm, Hoarseness, achey, HA, children have flu. Sinuses, sl cough.  Feels hot.  No v/d. On augmentin for tooth Migraine starting-pharmacy late on shot so breaking thru.  Req toradol.   There are no preventive care reminders to display for this patient.  Past Medical History:  Diagnosis Date   ADD (attention deficit disorder)    Anxiety    Asthma    Blood transfusion without reported diagnosis    Depression    GERD (gastroesophageal reflux disease)    IBS (irritable bowel syndrome)    Insomnia    Lumbago    Migraine    Sciatica    Sleep apnea    Tietze's disease     Past Surgical History:  Procedure Laterality Date   APPENDECTOMY     CESAREAN SECTION     TONSILLECTOMY     TOTAL ABDOMINAL HYSTERECTOMY W/ BILATERAL SALPINGOOPHORECTOMY     TUBAL LIGATION      Outpatient Medications Prior to Visit  Medication Sig Dispense Refill   albuterol (VENTOLIN HFA) 108 (90 Base) MCG/ACT inhaler Inhale 2 puffs into the lungs every 6 (six) hours as needed for wheezing or shortness of breath. 18 g 1   amLODipine (NORVASC) 10 MG tablet Take 1 tablet (10 mg total) by mouth daily. Replaces amlodipine 5, for uncontrolled blood pressure. 90 tablet 3   amoxicillin (AMOXIL) 875 MG tablet Take 1 tablet (875 mg total) by mouth 2 (two) times daily for 10 days. 20 tablet 0   amphetamine-dextroamphetamine (ADDERALL) 20 MG tablet Take 1 tablet (20 mg total) by mouth 2 (two) times daily. 60 tablet 0   budesonide-formoterol (SYMBICORT) 160-4.5 MCG/ACT inhaler Inhale 2 puffs into the lungs 2 (two) times daily. 1 each 3   chlorhexidine (PERIDEX) 0.12 % solution Use as directed 15 mLs in the mouth or throat 2 (two) times daily. 1893 mL 0   Erenumab-aooe (AIMOVIG) 140  MG/ML SOAJ Inject 140 mg into the skin every 30 (thirty) days. 1 mL 2   fluconazole (DIFLUCAN) 150 MG tablet Take 1 tablet (150 mg total) by mouth every three (3) days as needed. 5 tablet 0   fluticasone (FLONASE) 50 MCG/ACT nasal spray Place 1 spray into both nostrils daily. 16 g 0   fluticasone (FLONASE) 50 MCG/ACT nasal spray Place 1 spray into both nostrils daily. 16 g 0   gabapentin (NEURONTIN) 600 MG tablet Take 2 tablets (1,200 mg total) by mouth 2 (two) times daily. 360 tablet 0   HYDROcodone-acetaminophen (NORCO) 10-325 MG tablet Take 1 tablet by mouth every 6 (six) hours as needed for up to 7 days. Reduce dose as tolerated.  Only take for severe pain 28 tablet 0   losartan (COZAAR) 100 MG tablet Take 1 tablet (100 mg total) by mouth daily. 90 tablet 0   meloxicam (MOBIC) 15 MG tablet Take 1 tablet (15 mg total) by mouth daily. 90 tablet 0   nystatin (MYCOSTATIN) 100000 UNIT/ML suspension Take 5 mLs (500,000 Units total) by mouth 4 (four) times daily. Swish and hold in mouth few seconds and swallow 60 mL 0   pantoprazole (PROTONIX) 40 MG tablet Take 1 tablet (40 mg total)  by mouth daily. 90 tablet 0   prazosin (MINIPRESS) 1 MG capsule Take 1 capsule (1 mg total) by mouth at bedtime. 30 capsule 2   rizatriptan (MAXALT) 10 MG tablet Take 1 tablet (10 mg total) by mouth as needed for migraine. May repeat in 2 hours if needed 10 tablet 0   scopolamine (TRANSDERM-SCOP) 1 MG/3DAYS Place 1 patch (1.5 mg total) onto the skin every 3 (three) days. 10 patch 12   tiZANidine (ZANAFLEX) 4 MG tablet Take 1 tablet (4 mg total) by mouth every 6 (six) hours as needed for muscle spasms. 30 tablet 3   VENTOLIN HFA 108 (90 Base) MCG/ACT inhaler Inhale 2 puffs into the lungs every 6 (six) hours as needed for wheezing or shortness of breath. 18 g 0   No facility-administered medications prior to visit.    Allergies  Allergen Reactions   Cefazolin Hives   Ondansetron Hcl Hives   Sulfa Antibiotics Rash    Tramadol Hcl     Other reaction(s): chest pain   Emgality [Galcanezumab-Gnlm]    Ultram [Tramadol] Other (See Comments)    Tachycardia   Sulfasalazine Rash   ROS neg/noncontributory except as noted HPI/below      Objective:     BP (!) 120/90   Pulse 95   Temp 98.3 F (36.8 C) (Temporal)   Ht 4\' 11"  (1.499 m)   Wt 141 lb 6 oz (64.1 kg)   SpO2 99%   BMI 28.55 kg/m  Wt Readings from Last 3 Encounters:  07/15/22 141 lb 6 oz (64.1 kg)  07/13/22 136 lb (61.7 kg)  07/07/22 134 lb 3.2 oz (60.9 kg)    Physical Exam   Gen: WDWN NAD-looks mildly ill, flushed.  HEENT: NCAT, conjunctiva not injected, sclera nonicteric TM WNL B, OP moist, no exudates .  Hoarse.  Some thrush NECK:  supple, no thyromegaly, no nodes, CARDIAC: RRR, S1S2+, no murmur. LUNGS: CTAB. No wheezes EXT:  no edema MSK: no gross abnormalities.  NEURO: A&O x3.  CN II-XII intact.  PSYCH: normal mood. Good eye contact  Results for orders placed or performed in visit on 07/15/22  POC COVID-19  Result Value Ref Range   SARS Coronavirus 2 Ag Negative Negative  POCT Influenza A/B  Result Value Ref Range   Influenza A, POC Negative Negative   Influenza B, POC Negative Negative        Assessment & Plan:   Problem List Items Addressed This Visit       Cardiovascular and Mediastinum   Migraine   Relevant Medications   ketorolac (TORADOL) injection 60 mg (Start on 07/15/2022  5:00 PM)   Other Visit Diagnoses     Nonintractable headache, unspecified chronicity pattern, unspecified headache type    -  Primary   Relevant Medications   ketorolac (TORADOL) injection 60 mg (Start on 07/15/2022  5:00 PM)   Other Relevant Orders   POC COVID-19   POCT Influenza A/B   Sinus pressure       Relevant Orders   POC COVID-19   POCT Influenza A/B      Viral uri-may be covid.  Needs to retest tomorrow.  Since exposed to flu, will do tamiflu 75mg  bid but still retest tomorrow Migraine-last for aimovig so has now plus  sick.  Toradol 60mg  I'm given today.    Meds ordered this encounter  Medications   ketorolac (TORADOL) injection 60 mg    Wellington Hampshire, MD

## 2022-07-15 NOTE — Patient Instructions (Signed)
Test tomorrow for flu and covid   since exposed, take the tamiflu

## 2022-07-16 ENCOUNTER — Ambulatory Visit: Payer: Medicaid Other | Admitting: Family Medicine

## 2022-07-20 ENCOUNTER — Other Ambulatory Visit: Payer: Self-pay

## 2022-07-20 ENCOUNTER — Encounter: Payer: Self-pay | Admitting: Family Medicine

## 2022-07-20 DIAGNOSIS — K047 Periapical abscess without sinus: Secondary | ICD-10-CM

## 2022-07-20 MED ORDER — HYDROCODONE-ACETAMINOPHEN 10-325 MG PO TABS: 1.0000 | ORAL_TABLET | Freq: Four times a day (QID) | ORAL | 0 refills | Status: DC | PRN

## 2022-07-20 NOTE — Telephone Encounter (Signed)
Patient had tooth removed today. Oral surgeon states they will not fill narcotics due to recent fill from PCP. Patient is needing refill

## 2022-07-23 ENCOUNTER — Ambulatory Visit (INDEPENDENT_AMBULATORY_CARE_PROVIDER_SITE_OTHER): Payer: Medicaid Other | Admitting: Family Medicine

## 2022-07-23 ENCOUNTER — Encounter: Payer: Self-pay | Admitting: Family Medicine

## 2022-07-23 VITALS — BP 130/88 | HR 115 | Temp 98.8°F | Ht 59.0 in | Wt 142.4 lb

## 2022-07-23 DIAGNOSIS — F322 Major depressive disorder, single episode, severe without psychotic features: Secondary | ICD-10-CM | POA: Diagnosis not present

## 2022-07-23 DIAGNOSIS — E538 Deficiency of other specified B group vitamins: Secondary | ICD-10-CM | POA: Diagnosis not present

## 2022-07-23 DIAGNOSIS — F902 Attention-deficit hyperactivity disorder, combined type: Secondary | ICD-10-CM

## 2022-07-23 DIAGNOSIS — F419 Anxiety disorder, unspecified: Secondary | ICD-10-CM

## 2022-07-23 DIAGNOSIS — I10 Essential (primary) hypertension: Secondary | ICD-10-CM | POA: Diagnosis not present

## 2022-07-23 MED ORDER — LOSARTAN POTASSIUM 100 MG PO TABS: 100.0000 mg | ORAL_TABLET | Freq: Every day | ORAL | 0 refills | Status: DC

## 2022-07-23 MED ORDER — ESCITALOPRAM OXALATE 10 MG PO TABS: 10.0000 mg | ORAL_TABLET | Freq: Every day | ORAL | 1 refills | Status: DC

## 2022-07-23 MED ORDER — PRAZOSIN HCL 1 MG PO CAPS: 1.0000 mg | ORAL_CAPSULE | Freq: Every day | ORAL | 2 refills | Status: DC

## 2022-07-23 NOTE — Progress Notes (Signed)
Subjective:     Patient ID: Laurie Roman, female    DOB: 1975/07/20, 47 y.o.   MRN: [id number]  Chief Complaint  Patient presents with   Follow-up    Follow-up on HTN and anxiety/depression    HPI 1.  HTN-Pt is on amlodipine 10 and Losartan 100.  Bp's running all over-depends on pain from tooth, etc.  No ha/dizziness/cp/palp/edema/cough/sob 2.  Moods-a lot of stress. Crying.  Children missing out on Dad(deceased).  Lost husb 3 yrs ago(12/28).  Daughter having baby 2/17.   Not sleeping well, nightmares.  Tizanidine helps sleep, but still nightmares.  Prazosin-didn't take long.  Has been on paxil, zoloft,"R", elavil(caused SI),xanax,klonopin.  Pt scared of meds.   No SI.  Moods can be up and down.   At times can't get out of bed"not want to face the world".   No spending sprees.  When goes to home, doesn't feel like her home.  3.  ADD-can't focus if not take adderall and makes mistakes at work.     There are no preventive care reminders to display for this patient.  Past Medical History:  Diagnosis Date   ADD (attention deficit disorder)    Anxiety    Asthma    Blood transfusion without reported diagnosis    Depression    GERD (gastroesophageal reflux disease)    IBS (irritable bowel syndrome)    Insomnia    Lumbago    Migraine    Sciatica    Sleep apnea    Tietze's disease     Past Surgical History:  Procedure Laterality Date   APPENDECTOMY     CESAREAN SECTION     TONSILLECTOMY     TOTAL ABDOMINAL HYSTERECTOMY W/ BILATERAL SALPINGOOPHORECTOMY     TUBAL LIGATION      Outpatient Medications Prior to Visit  Medication Sig Dispense Refill   albuterol (VENTOLIN HFA) 108 (90 Base) MCG/ACT inhaler Inhale 2 puffs into the lungs every 6 (six) hours as needed for wheezing or shortness of breath. 18 g 1   amLODipine (NORVASC) 10 MG tablet Take 1 tablet (10 mg total) by mouth daily. Replaces amlodipine 5, for uncontrolled blood pressure. 90 tablet 3    amphetamine-dextroamphetamine (ADDERALL) 20 MG tablet Take 1 tablet (20 mg total) by mouth 2 (two) times daily. 60 tablet 0   Erenumab-aooe (AIMOVIG) 140 MG/ML SOAJ Inject 140 mg into the skin every 30 (thirty) days. 1 mL 2   fluticasone (FLONASE) 50 MCG/ACT nasal spray Place 1 spray into both nostrils daily. 16 g 0   fluticasone (FLONASE) 50 MCG/ACT nasal spray Place 1 spray into both nostrils daily. 16 g 0   gabapentin (NEURONTIN) 600 MG tablet Take 2 tablets (1,200 mg total) by mouth 2 (two) times daily. 360 tablet 0   HYDROcodone-acetaminophen (NORCO) 10-325 MG tablet Take 1 tablet by mouth every 6 (six) hours as needed for up to 7 days. Reduce dose as tolerated.  Only take for severe pain 28 tablet 0   pantoprazole (PROTONIX) 40 MG tablet Take 1 tablet (40 mg total) by mouth daily. 90 tablet 0   rizatriptan (MAXALT) 10 MG tablet Take 1 tablet (10 mg total) by mouth as needed for migraine. May repeat in 2 hours if needed 10 tablet 0   tiZANidine (ZANAFLEX) 4 MG tablet Take 1 tablet (4 mg total) by mouth every 6 (six) hours as needed for muscle spasms. 30 tablet 3   VENTOLIN HFA 108 (90 Base) MCG/ACT inhaler Inhale 2  puffs into the lungs every 6 (six) hours as needed for wheezing or shortness of breath. 18 g 0   losartan (COZAAR) 100 MG tablet Take 1 tablet (100 mg total) by mouth daily. 90 tablet 0   budesonide-formoterol (SYMBICORT) 160-4.5 MCG/ACT inhaler Inhale 2 puffs into the lungs 2 (two) times daily. (Patient not taking: Reported on 07/23/2022) 1 each 3   chlorhexidine (PERIDEX) 0.12 % solution Use as directed 15 mLs in the mouth or throat 2 (two) times daily. (Patient not taking: Reported on 07/23/2022) 1893 mL 0   fluconazole (DIFLUCAN) 150 MG tablet Take 1 tablet (150 mg total) by mouth every three (3) days as needed. (Patient not taking: Reported on 07/23/2022) 5 tablet 0   meloxicam (MOBIC) 15 MG tablet Take 1 tablet (15 mg total) by mouth daily. (Patient not taking: Reported on 07/23/2022)  90 tablet 0   nystatin (MYCOSTATIN) 100000 UNIT/ML suspension Take 5 mLs (500,000 Units total) by mouth 4 (four) times daily. Swish and hold in mouth few seconds and swallow (Patient not taking: Reported on 07/23/2022) 60 mL 0   oseltamivir (TAMIFLU) 75 MG capsule Take 1 capsule (75 mg total) by mouth 2 (two) times daily. (Patient not taking: Reported on 07/23/2022) 10 capsule 0   prazosin (MINIPRESS) 1 MG capsule Take 1 capsule (1 mg total) by mouth at bedtime. (Patient not taking: Reported on 07/23/2022) 30 capsule 2   scopolamine (TRANSDERM-SCOP) 1 MG/3DAYS Place 1 patch (1.5 mg total) onto the skin every 3 (three) days. (Patient not taking: Reported on 07/23/2022) 10 patch 12   No facility-administered medications prior to visit.    Allergies  Allergen Reactions   Cefazolin Hives   Ondansetron Hcl Hives   Sulfa Antibiotics Rash   Tramadol Hcl     Other reaction(s): chest pain   Emgality [Galcanezumab-Gnlm]    Ultram [Tramadol] Other (See Comments)    Tachycardia   Sulfasalazine Rash   ROS neg/noncontributory except as noted HPI/below      Objective:     BP 130/88   Pulse (!) 115   Temp 98.8 F (37.1 C) (Temporal)   Ht 4' 11"$  (1.499 m)   Wt 142 lb 6 oz (64.6 kg)   SpO2 98%   BMI 28.76 kg/m  Wt Readings from Last 3 Encounters:  07/23/22 142 lb 6 oz (64.6 kg)  07/15/22 141 lb 6 oz (64.1 kg)  07/13/22 136 lb (61.7 kg)    Physical Exam   Gen: WDWN NAD HEENT: NCAT, conjunctiva not injected, sclera nonicteric NECK:  supple, no thyromegaly, no nodes, no carotid bruits CARDIAC: tachyRRR, S1S2+, no murmur. DP 2+B LUNGS: CTAB. No wheezes ABDOMEN:  BS+, soft, NTND, No HSM, no masses EXT:  no edema MSK: no gross abnormalities.  NEURO: A&O x3.  CN II-XII intact.  PSYCH: normal mood. Good eye contact. Tearful  Spent 45 minutes listening to patient, getting history, discussing medications/side effects, discussing plan and rationale for referrals.     Assessment & Plan:    Problem List Items Addressed This Visit       Cardiovascular and Mediastinum   Essential hypertension - Primary   Relevant Medications   losartan (COZAAR) 100 MG tablet   prazosin (MINIPRESS) 1 MG capsule   Other Relevant Orders   Comprehensive metabolic panel     Other   ADHD   Anxiety   Relevant Medications   escitalopram (LEXAPRO) 10 MG tablet   Other Relevant Orders   Ambulatory referral to Psychology  Depression, major, single episode, severe ([place])   Relevant Medications   escitalopram (LEXAPRO) 10 MG tablet   Other Relevant Orders   Ambulatory referral to Psychology   Other Visit Diagnoses     B12 deficiency       Relevant Orders   Vitamin B12     1.  Hypertension0 chronic.  Well-controlled today.  Blood pressures are up and down all the time.  Part of this may be stress and anxiety induced.  She did not see any changes on or off Adderall.  Continue losartan 100 mg, amlodipine 5 mg.  Check CBC, CMP.  Monitor blood pressures.  Follow-up in 1 month 2.  ADD-chronic.  Well-controlled on Adderall 20 mg twice daily.  Continue.  PDMP has been checked.  Monitor closely as blood pressures all over and pulse. 3.  Adjustment disorder-chronic.  Mixed anxiety/depression.  Patient is very concerned about medications/side effects.  We also entertain the idea of possibility of bipolar disorder.  Will get her referred to psychology for evaluation and counseling.  She is having a hard time adjusting to life after the death of her husband in an automobile accident 3 years ago.  I suggested considering selling her house and moving into something smaller as part of moving on with life.  Referral was placed to psych as well for counseling.  Will cautiously start escitalopram 10 mg daily.  As she works with [place], we can monitor her closely.  Also has some PTSD/nightmares.  Will start prazosin 1 mg at bedtime.  Advised this is not a sleeping pill but more to lower the nightmares. 4.  Vitamin B12  deficiency-check B12 level.  B12 injection was given to the patient.  Follow-up in 1 month  Meds ordered this encounter  Medications   losartan (COZAAR) 100 MG tablet    Sig: Take 1 tablet (100 mg total) by mouth daily.    Dispense:  90 tablet    Refill:  0   prazosin (MINIPRESS) 1 MG capsule    Sig: Take 1 capsule (1 mg total) by mouth at bedtime.    Dispense:  30 capsule    Refill:  2   escitalopram (LEXAPRO) 10 MG tablet    Sig: Take 1 tablet (10 mg total) by mouth daily.    Dispense:  30 tablet    Refill:  1    Wellington Hampshire, MD

## 2022-07-23 NOTE — Patient Instructions (Signed)
Prazosin at bed for nightmares  Escitalopram for moods.

## 2022-08-04 ENCOUNTER — Ambulatory Visit (INDEPENDENT_AMBULATORY_CARE_PROVIDER_SITE_OTHER): Payer: Medicaid Other | Admitting: Family Medicine

## 2022-08-04 ENCOUNTER — Encounter: Payer: Self-pay | Admitting: Family Medicine

## 2022-08-04 ENCOUNTER — Other Ambulatory Visit: Payer: Self-pay

## 2022-08-04 VITALS — BP 150/100 | HR 102 | Temp 98.3°F | Ht 59.0 in | Wt 142.2 lb

## 2022-08-04 DIAGNOSIS — N3001 Acute cystitis with hematuria: Secondary | ICD-10-CM | POA: Diagnosis not present

## 2022-08-04 DIAGNOSIS — F902 Attention-deficit hyperactivity disorder, combined type: Secondary | ICD-10-CM

## 2022-08-04 DIAGNOSIS — R1030 Lower abdominal pain, unspecified: Secondary | ICD-10-CM | POA: Diagnosis not present

## 2022-08-04 DIAGNOSIS — M545 Low back pain, unspecified: Secondary | ICD-10-CM

## 2022-08-04 LAB — POCT URINALYSIS DIPSTICK
Bilirubin, UA: NEGATIVE
Blood, UA: POSITIVE
Glucose, UA: NEGATIVE
Ketones, UA: NEGATIVE
Nitrite, UA: NEGATIVE
Protein, UA: POSITIVE — AB
Spec Grav, UA: 1.025
Urobilinogen, UA: 0.2 U/dL
pH, UA: 6

## 2022-08-04 MED ORDER — AMPHETAMINE-DEXTROAMPHETAMINE 20 MG PO TABS: 20.0000 mg | ORAL_TABLET | Freq: Two times a day (BID) | ORAL | 0 refills | Status: DC

## 2022-08-04 MED ORDER — CIPROFLOXACIN HCL 500 MG PO TABS: 500.0000 mg | ORAL_TABLET | Freq: Two times a day (BID) | ORAL | 0 refills | Status: AC

## 2022-08-04 NOTE — Telephone Encounter (Signed)
Last OV: 07/15/22 dx. Headache, ADHD Last refill: 07/11/22 #60, 0

## 2022-08-04 NOTE — Progress Notes (Signed)
Subjective:     Patient ID: Laurie Roman, female    DOB: [birth year], 47 y.o.   MRN: [id number]  Chief Complaint  Patient presents with   Roman Pain    Lower Roman pain that started 2 days ago   Abdominal Pain    Lower abdominal discomfort    HPI Since yesterday, urinary freq, hesitancy.  Now, dysuria as well.  Some LBP worsening also.  No f/c/n/v.  Feeling miserable.   Some lower abd discomfort as well.   There are no preventive care reminders to display for this patient.  Past Medical History:  Diagnosis Date   ADD (attention deficit disorder)    Anxiety    Asthma    Blood transfusion without reported diagnosis    Depression    GERD (gastroesophageal reflux disease)    IBS (irritable bowel syndrome)    Insomnia    Lumbago    Migraine    Sciatica    Sleep apnea    Tietze's disease     Past Surgical History:  Procedure Laterality Date   APPENDECTOMY     CESAREAN SECTION     TONSILLECTOMY     TOTAL ABDOMINAL HYSTERECTOMY W/ BILATERAL SALPINGOOPHORECTOMY     TUBAL LIGATION      Outpatient Medications Prior to Visit  Medication Sig Dispense Refill   albuterol (VENTOLIN HFA) 108 (90 Base) MCG/ACT inhaler Inhale 2 puffs into the lungs every 6 (six) hours as needed for wheezing or shortness of breath. 18 g 1   amLODipine (NORVASC) 10 MG tablet Take 1 tablet (10 mg total) by mouth daily. Replaces amlodipine 5, for uncontrolled blood pressure. 90 tablet 3   budesonide-formoterol (SYMBICORT) 160-4.5 MCG/ACT inhaler Inhale 2 puffs into the lungs 2 (two) times daily. 1 each 3   chlorhexidine (PERIDEX) 0.12 % solution Use as directed 15 mLs in the mouth or throat 2 (two) times daily. 1893 mL 0   Erenumab-aooe (AIMOVIG) 140 MG/ML SOAJ Inject 140 mg into the skin every 30 (thirty) days. 1 mL 2   escitalopram (LEXAPRO) 10 MG tablet Take 1 tablet (10 mg total) by mouth daily. 30 tablet 1   fluconazole (DIFLUCAN) 150 MG tablet Take 1 tablet (150 mg total) by mouth every three (3)  days as needed. 5 tablet 0   fluticasone (FLONASE) 50 MCG/ACT nasal spray Place 1 spray into both nostrils daily. 16 g 0   gabapentin (NEURONTIN) 600 MG tablet Take 2 tablets (1,200 mg total) by mouth 2 (two) times daily. 360 tablet 0   HYDROcodone-acetaminophen (NORCO) 10-325 MG tablet Take 1 tablet by mouth 4 (four) times daily as needed.     losartan (COZAAR) 100 MG tablet Take 1 tablet (100 mg total) by mouth daily. 90 tablet 0   meloxicam (MOBIC) 15 MG tablet Take 1 tablet (15 mg total) by mouth daily. 90 tablet 0   nystatin (MYCOSTATIN) 100000 UNIT/ML suspension Take 5 mLs (500,000 Units total) by mouth 4 (four) times daily. Swish and hold in mouth few seconds and swallow 60 mL 0   pantoprazole (PROTONIX) 40 MG tablet Take 1 tablet (40 mg total) by mouth daily. 90 tablet 0   prazosin (MINIPRESS) 1 MG capsule Take 1 capsule (1 mg total) by mouth at bedtime. 30 capsule 2   rizatriptan (MAXALT) 10 MG tablet Take 1 tablet (10 mg total) by mouth as needed for migraine. May repeat in 2 hours if needed 10 tablet 0   tiZANidine (ZANAFLEX) 4 MG tablet Take  1 tablet (4 mg total) by mouth every 6 (six) hours as needed for muscle spasms. 30 tablet 3   VENTOLIN HFA 108 (90 Base) MCG/ACT inhaler Inhale 2 puffs into the lungs every 6 (six) hours as needed for wheezing or shortness of breath. 18 g 0   amphetamine-dextroamphetamine (ADDERALL) 20 MG tablet Take 1 tablet (20 mg total) by mouth 2 (two) times daily. 60 tablet 0   fluticasone (FLONASE) 50 MCG/ACT nasal spray Place 1 spray into both nostrils daily. 16 g 0   No facility-administered medications prior to visit.    Allergies  Allergen Reactions   Cefazolin Hives   Ondansetron Hcl Hives   Sulfa Antibiotics Rash   Tramadol Hcl     Other reaction(s): chest pain   Emgality [Galcanezumab-Gnlm]    Ultram [Tramadol] Other (See Comments)    Tachycardia   Sulfasalazine Rash   ROS neg/noncontributory except as noted HPI/below      Objective:      BP (!) 150/100   Pulse (!) 102   Temp 98.3 F (36.8 C) (Temporal)   Ht '4\' 11"'$  (1.499 m)   Wt 142 lb 4 oz (64.5 kg)   SpO2 97%   BMI 28.73 kg/m  Wt Readings from Last 3 Encounters:  08/04/22 142 lb 4 oz (64.5 kg)  07/23/22 142 lb 6 oz (64.6 kg)  07/15/22 141 lb 6 oz (64.1 kg)    Physical Exam   Gen: WDWN NAD HEENT: NCAT, conjunctiva not injected, sclera nonicteric ABDOMEN:  BS+, soft, mildly tender lower abd, No HSM, no masses  some B CVAT but also, TTP. EXT:  no edema MSK: no gross abnormalities.  NEURO: A&O x3.  CN II-XII intact.  PSYCH: normal mood. Good eye contact  Results for orders placed or performed in visit on 08/04/22  POCT urinalysis dipstick  Result Value Ref Range   Color, UA YELLOW    Clarity, UA     Glucose, UA Negative Negative   Bilirubin, UA NEG    Ketones, UA NEG    Spec Grav, UA 1.025 1.010 - 1.025   Blood, UA POSITIVE    pH, UA 6.0 5.0 - 8.0   Protein, UA Positive (A) Negative   Urobilinogen, UA 0.2 0.2 or 1.0 E.U./dL   Nitrite, UA NEG    Leukocytes, UA Moderate (2+) (A) Negative   Appearance     Odor          Assessment & Plan:   Problem List Items Addressed This Visit       Other   ADHD   Relevant Medications   amphetamine-dextroamphetamine (ADDERALL) 20 MG tablet   Other Visit Diagnoses     Acute cystitis with hematuria    -  Primary   Low Roman pain without sciatica, unspecified Roman pain laterality, unspecified chronicity       Relevant Medications   HYDROcodone-acetaminophen (NORCO) 10-325 MG tablet   Other Relevant Orders   POCT urinalysis dipstick (Completed)   Urine Culture   Lower abdominal pain       Relevant Orders   POCT urinalysis dipstick (Completed)   Urine Culture      UTI-send cx.  Cipro '500mg'$  bid x 7d.  Has azo ADD-renewed adderall '20mg'$  bid.  Meds ordered this encounter  Medications   amphetamine-dextroamphetamine (ADDERALL) 20 MG tablet    Sig: Take 1 tablet (20 mg total) by mouth 2 (two) times  daily.    Dispense:  60 tablet    Refill:  0   ciprofloxacin (CIPRO) 500 MG tablet    Sig: Take 1 tablet (500 mg total) by mouth 2 (two) times daily for 10 days.    Dispense:  14 tablet    Refill:  0    Wellington Hampshire, MD

## 2022-08-05 DIAGNOSIS — R1030 Lower abdominal pain, unspecified: Secondary | ICD-10-CM | POA: Diagnosis not present

## 2022-08-05 DIAGNOSIS — M545 Low back pain, unspecified: Secondary | ICD-10-CM | POA: Diagnosis not present

## 2022-08-06 ENCOUNTER — Ambulatory Visit (INDEPENDENT_AMBULATORY_CARE_PROVIDER_SITE_OTHER): Payer: Medicaid Other | Admitting: Internal Medicine

## 2022-08-06 ENCOUNTER — Encounter: Payer: Self-pay | Admitting: Internal Medicine

## 2022-08-06 VITALS — BP 130/88 | HR 92 | Temp 97.4°F | Ht 59.0 in | Wt 142.2 lb

## 2022-08-06 DIAGNOSIS — R1030 Lower abdominal pain, unspecified: Secondary | ICD-10-CM

## 2022-08-06 DIAGNOSIS — M545 Low back pain, unspecified: Secondary | ICD-10-CM | POA: Diagnosis not present

## 2022-08-06 DIAGNOSIS — N3001 Acute cystitis with hematuria: Secondary | ICD-10-CM

## 2022-08-06 DIAGNOSIS — B3731 Acute candidiasis of vulva and vagina: Secondary | ICD-10-CM

## 2022-08-06 DIAGNOSIS — M62830 Muscle spasm of back: Secondary | ICD-10-CM

## 2022-08-06 LAB — POCT URINALYSIS DIPSTICK
Bilirubin, UA: NEGATIVE
Glucose, UA: NEGATIVE
Ketones, UA: NEGATIVE
Nitrite, UA: NEGATIVE
Protein, UA: POSITIVE — AB
Spec Grav, UA: 1.025 (ref 1.010–1.025)
Urobilinogen, UA: 0.2 E.U./dL
pH, UA: 6 (ref 5.0–8.0)

## 2022-08-06 MED ORDER — KETOROLAC TROMETHAMINE 60 MG/2ML IM SOLN: 60.0000 mg | Freq: Once | INTRAMUSCULAR | Status: DC

## 2022-08-06 MED ORDER — KETOROLAC TROMETHAMINE 60 MG/2ML IM SOLN
60.0000 mg | Freq: Once | INTRAMUSCULAR | Status: AC
  Administered 2022-08-06: 60 mg via INTRAMUSCULAR

## 2022-08-06 MED ORDER — HYDROCODONE-ACETAMINOPHEN 5-325 MG PO TABS: 1.0000 | ORAL_TABLET | Freq: Four times a day (QID) | ORAL | 0 refills | Status: DC | PRN

## 2022-08-06 MED ORDER — FLUCONAZOLE 150 MG PO TABS: 150.0000 mg | ORAL_TABLET | ORAL | 1 refills | Status: DC | PRN

## 2022-08-06 MED ORDER — NITROFURANTOIN MONOHYD MACRO 100 MG PO CAPS: 100.0000 mg | ORAL_CAPSULE | Freq: Two times a day (BID) | ORAL | 0 refills | Status: DC

## 2022-08-06 MED ORDER — KETOROLAC TROMETHAMINE 10 MG PO TABS: 10.0000 mg | ORAL_TABLET | Freq: Four times a day (QID) | ORAL | 0 refills | Status: DC | PRN

## 2022-08-06 NOTE — Progress Notes (Signed)
[facility]: [phone]   Routine Medical Office Visit  Patient:  Laurie Roman      Age: 47 y.o.       Sex:  female  Date:   08/07/2022  PCP:    Tawnya Crook, MD   [facility] Provider: Loralee Pacas, MD   Problem Focused Charting:   Medical Decision Making per Assessment/Plan   Laurie Roman was seen today for uti symptoms.  Acute cystitis with hematuria -     Nitrofurantoin Monohyd Macro; Take 1 capsule (100 mg total) by mouth 2 (two) times daily.  Dispense: 14 capsule; Refill: 0  Back muscle spasm -     Ketorolac Tromethamine  Low back pain without sciatica, unspecified back pain laterality, unspecified chronicity -     POCT urinalysis dipstick -     HYDROcodone-Acetaminophen; Take 1 tablet by mouth every 6 (six) hours as needed for moderate pain or severe pain.  Dispense: 20 tablet; Refill: 0 -     Ketorolac Tromethamine  Lower abdominal pain -     POCT urinalysis dipstick  Vaginal candidiasis -     Fluconazole; Take 1 tablet (150 mg total) by mouth every 3 (three) days as needed.  Dispense: 2 tablet; Refill: 1   Due to symptom(s) worsening and going up into flanks despite antibiotic(s), and we don't have culture results back, will try alternative empiric therapy for failed cystitis treatment.  The back mm spasm / flank pain seems unrelated to urinary tract infection (UTI), so there are actually 2 separate and unrelated problems going on today as a result.  Cipro seems to be failing even though her e.coli urinary tract infection (UTI) was sensitive to it her last culture.  She lacks systemic signs and symptoms such as fever, myalgias, malaise, fatigue, chills to warrant going of the emergency room - but was advised to go if the new antibiotic(s) fail.   She has no cost to vertebral angle tenderness whenever it is knocked on by fast so I believe the back pain is due to an area of exquisite tenderness and knotting up of muscle which feels like a  charley horse on her right paraspinal L1-L4 area.  She already taking Zanaflex and has lidocaine rubs not.  She recently run out of hydrocodone after many months- I suspect this is a side effect(s) of opioid withdrawal.  I wrote for a lower dose hydrocodone to help her try to taper off and to manage the pain.   I gave Toradol for immediate pain relief.    Medical Decision Making: 1 or more chronic illnesses with exacerbation,  progression, or side effects of treatment Prescription drug management       Subjective - Clinical Presentation:   Laurie Roman is a 47 y.o. female  Patient Active Problem List   Diagnosis Date Noted   Chronic dental pain 06/29/2022   Cervicalgia 03/27/2022   Depression, major, single episode, severe ([place]) 05/16/2020   Primary insomnia 09/15/2019   Anxiety    Maxillary sinusitis 12/02/2018   Asthma 03/27/2018   Controlled substance agreement signed 10/13/2017   Migraine 08/21/2017   ADHD 08/21/2017   Essential hypertension 08/21/2017   Mild obstructive sleep apnea 02/18/2017   Left sided sciatica 09/27/2015   Gastroesophageal reflux disease without esophagitis 05/02/2014   Past Medical History:  Diagnosis Date   ADD (attention deficit disorder)    Anxiety    Asthma    Blood transfusion without reported diagnosis  Depression    GERD (gastroesophageal reflux disease)    IBS (irritable bowel syndrome)    Insomnia    Lumbago    Migraine    Sciatica    Sleep apnea    Tietze's disease     Outpatient Medications Prior to Visit  Medication Sig   albuterol (VENTOLIN HFA) 108 (90 Base) MCG/ACT inhaler Inhale 2 puffs into the lungs every 6 (six) hours as needed for wheezing or shortness of breath.   amLODipine (NORVASC) 10 MG tablet Take 1 tablet (10 mg total) by mouth daily. Replaces amlodipine 5, for uncontrolled blood pressure.   amphetamine-dextroamphetamine (ADDERALL) 20 MG tablet Take 1 tablet (20 mg total) by mouth 2 (two) times daily.    budesonide-formoterol (SYMBICORT) 160-4.5 MCG/ACT inhaler Inhale 2 puffs into the lungs 2 (two) times daily.   chlorhexidine (PERIDEX) 0.12 % solution Use as directed 15 mLs in the mouth or throat 2 (two) times daily.   ciprofloxacin (CIPRO) 500 MG tablet Take 1 tablet (500 mg total) by mouth 2 (two) times daily for 10 days.   Erenumab-aooe (AIMOVIG) 140 MG/ML SOAJ Inject 140 mg into the skin every 30 (thirty) days.   escitalopram (LEXAPRO) 10 MG tablet Take 1 tablet (10 mg total) by mouth daily.   fluconazole (DIFLUCAN) 150 MG tablet Take 1 tablet (150 mg total) by mouth every three (3) days as needed.   fluticasone (FLONASE) 50 MCG/ACT nasal spray Place 1 spray into both nostrils daily.   gabapentin (NEURONTIN) 600 MG tablet Take 2 tablets (1,200 mg total) by mouth 2 (two) times daily.   losartan (COZAAR) 100 MG tablet Take 1 tablet (100 mg total) by mouth daily.   nystatin (MYCOSTATIN) 100000 UNIT/ML suspension Take 5 mLs (500,000 Units total) by mouth 4 (four) times daily. Swish and hold in mouth few seconds and swallow   pantoprazole (PROTONIX) 40 MG tablet Take 1 tablet (40 mg total) by mouth daily.   prazosin (MINIPRESS) 1 MG capsule Take 1 capsule (1 mg total) by mouth at bedtime.   rizatriptan (MAXALT) 10 MG tablet Take 1 tablet (10 mg total) by mouth as needed for migraine. May repeat in 2 hours if needed   tiZANidine (ZANAFLEX) 4 MG tablet Take 1 tablet (4 mg total) by mouth every 6 (six) hours as needed for muscle spasms.   VENTOLIN HFA 108 (90 Base) MCG/ACT inhaler Inhale 2 puffs into the lungs every 6 (six) hours as needed for wheezing or shortness of breath.   [DISCONTINUED] HYDROcodone-acetaminophen (NORCO) 10-325 MG tablet Take 1 tablet by mouth 4 (four) times daily as needed.   [DISCONTINUED] meloxicam (MOBIC) 15 MG tablet Take 1 tablet (15 mg total) by mouth daily.   No facility-administered medications prior to visit.    Chief Complaint  Patient presents with   UTI symptoms     Antibiotic failed.    HPI           Objective:  Physical Exam  BP 130/88 (BP Location: Left Arm, Patient Position: Sitting)   Pulse 92   Temp (!) 97.4 F (36.3 C) (Temporal)   Ht '4\' 11"'$  (1.499 m)   Wt 142 lb 4 oz (64.5 kg)   SpO2 98%   BMI 28.73 kg/m   Overweight  by BMI criteria but truncal adiposity (waist circumference or caliper) should be used instead. Wt Readings from Last 10 Encounters:  08/06/22 142 lb 4 oz (64.5 kg)  08/04/22 142 lb 4 oz (64.5 kg)  07/23/22 142 lb 6  oz (64.6 kg)  07/15/22 141 lb 6 oz (64.1 kg)  07/13/22 136 lb (61.7 kg)  07/07/22 134 lb 3.2 oz (60.9 kg)  05/22/22 138 lb 6 oz (62.8 kg)  05/11/22 138 lb (62.6 kg)  04/10/22 144 lb 8 oz (65.5 kg)  03/27/22 140 lb 4 oz (63.6 kg)   Vital signs reviewed.  Nursing notes reviewed. Weight trend reviewed. General Appearance:  Well developed, well nourished female in no acute distress.   Normal work of breathing at rest Musculoskeletal: All extremities are intact.  Neurological:  Awake, alert,  No obvious focal neurological deficits or cognitive impairments Psychiatric:  Appropriate mood, pleasant demeanor Problem-specific findings:  very tender paraspinal lump that feels like a charlie horse/muscle spasm.  L1-L4 on right.  No costovertebral angle tenderness.   Results Reviewed: Results for orders placed or performed in visit on 08/06/22  POCT Urinalysis Dipstick  Result Value Ref Range   Color, UA cloudy    Clarity, UA yellow    Glucose, UA Negative Negative   Bilirubin, UA Negative    Ketones, UA Negative    Spec Grav, UA 1.025 1.010 - 1.025   Blood, UA 3+    pH, UA 6.0 5.0 - 8.0   Protein, UA Positive (A) Negative   Urobilinogen, UA 0.2 0.2 or 1.0 E.U./dL   Nitrite, UA Negative    Leukocytes, UA Moderate (2+) (A) Negative   Appearance     Odor      Recent Results (from the past 2160 hour(s))  CBC with Differential/Platelet     Status: Abnormal   Collection Time: 05/13/22 10:23 AM   Result Value Ref Range   WBC 12.1 (H) 4.0 - 10.5 K/uL   RBC 4.04 3.87 - 5.11 Mil/uL   Hemoglobin 12.6 12.0 - 15.0 g/dL   HCT 37.2 36.0 - 46.0 %   MCV 92.2 78.0 - 100.0 fl   MCHC 33.8 30.0 - 36.0 g/dL   RDW 13.7 11.5 - 15.5 %   Platelets 398.0 150.0 - 400.0 K/uL   Neutrophils Relative % 63.9 43.0 - 77.0 %   Lymphocytes Relative 24.9 12.0 - 46.0 %   Monocytes Relative 7.9 3.0 - 12.0 %   Eosinophils Relative 2.2 0.0 - 5.0 %   Basophils Relative 1.1 0.0 - 3.0 %   Neutro Abs 7.7 1.4 - 7.7 K/uL   Lymphs Abs 3.0 0.7 - 4.0 K/uL   Monocytes Absolute 1.0 0.1 - 1.0 K/uL   Eosinophils Absolute 0.3 0.0 - 0.7 K/uL   Basophils Absolute 0.1 0.0 - 0.1 K/uL  B12     Status: None   Collection Time: 05/13/22 10:23 AM  Result Value Ref Range   Vitamin B-12 236 211 - 911 pg/mL  Iron, TIBC and Ferritin Panel     Status: None   Collection Time: 05/13/22 10:23 AM  Result Value Ref Range   Iron 63 40 - 190 mcg/dL   TIBC 374 250 - 450 mcg/dL (calc)   %SAT 17 16 - 45 % (calc)   Ferritin 48 16 - 232 ng/mL  Urine cytology ancillary only([place])     Status: Abnormal   Collection Time: 05/13/22 10:23 AM  Result Value Ref Range   Neisseria Gonorrhea Negative    Chlamydia Negative    Trichomonas Negative    Bacterial Vaginitis-Urine Negative    Candida Urine Positive (A)    Candida Urine Negative (A)    Molecular Comment      For tests bacteria and/or  candida, this specimen does not meet the   Molecular Comment      strict criteria set by the FDA. The result interpretation should be   Molecular Comment      considered in conjunction with the patient's clinical history.   Comment Normal Reference Ranger Chlamydia - Negative    Comment      Normal Reference Range Neisseria Gonorrhea - Negative   Comment Normal Reference Range Trichomonas - Negative   POC COVID-19     Status: Normal   Collection Time: 07/15/22  5:00 PM  Result Value Ref Range   SARS Coronavirus 2 Ag Negative Negative  POCT  Influenza A/B     Status: Normal   Collection Time: 07/15/22  5:01 PM  Result Value Ref Range   Influenza A, POC Negative Negative   Influenza B, POC Negative Negative  POCT urinalysis dipstick     Status: Abnormal   Collection Time: 08/04/22 12:03 PM  Result Value Ref Range   Color, UA YELLOW    Clarity, UA     Glucose, UA Negative Negative   Bilirubin, UA NEG    Ketones, UA NEG    Spec Grav, UA 1.025 1.010 - 1.025   Blood, UA POSITIVE    pH, UA 6.0 5.0 - 8.0   Protein, UA Positive (A) Negative   Urobilinogen, UA 0.2 0.2 or 1.0 E.U./dL   Nitrite, UA NEG    Leukocytes, UA Moderate (2+) (A) Negative   Appearance     Odor    POCT Urinalysis Dipstick     Status: Abnormal   Collection Time: 08/06/22  4:51 PM  Result Value Ref Range   Color, UA cloudy    Clarity, UA yellow    Glucose, UA Negative Negative   Bilirubin, UA Negative    Ketones, UA Negative    Spec Grav, UA 1.025 1.010 - 1.025   Blood, UA 3+    pH, UA 6.0 5.0 - 8.0   Protein, UA Positive (A) Negative   Urobilinogen, UA 0.2 0.2 or 1.0 E.U./dL   Nitrite, UA Negative    Leukocytes, UA Moderate (2+) (A) Negative   Appearance     Odor          Signed: Loralee Pacas, MD 08/07/2022 10:56 AM

## 2022-08-07 LAB — URINE CULTURE
MICRO NUMBER:: 14626672
SPECIMEN QUALITY:: ADEQUATE

## 2022-08-17 ENCOUNTER — Encounter: Payer: Self-pay | Admitting: Internal Medicine

## 2022-08-17 ENCOUNTER — Ambulatory Visit (INDEPENDENT_AMBULATORY_CARE_PROVIDER_SITE_OTHER): Payer: Medicaid Other | Admitting: Internal Medicine

## 2022-08-17 VITALS — BP 100/74 | HR 94 | Temp 97.3°F | Ht 59.0 in | Wt 138.0 lb

## 2022-08-17 DIAGNOSIS — I10 Essential (primary) hypertension: Secondary | ICD-10-CM

## 2022-08-17 DIAGNOSIS — M5442 Lumbago with sciatica, left side: Secondary | ICD-10-CM

## 2022-08-17 DIAGNOSIS — R0789 Other chest pain: Secondary | ICD-10-CM | POA: Insufficient documentation

## 2022-08-17 DIAGNOSIS — M5432 Sciatica, left side: Secondary | ICD-10-CM

## 2022-08-17 DIAGNOSIS — E538 Deficiency of other specified B group vitamins: Secondary | ICD-10-CM

## 2022-08-17 DIAGNOSIS — F1193 Opioid use, unspecified with withdrawal: Secondary | ICD-10-CM

## 2022-08-17 DIAGNOSIS — E611 Iron deficiency: Secondary | ICD-10-CM | POA: Diagnosis not present

## 2022-08-17 HISTORY — DX: Other chest pain: R07.89

## 2022-08-17 MED ORDER — CELECOXIB 100 MG PO CAPS: 100.0000 mg | ORAL_CAPSULE | Freq: Two times a day (BID) | ORAL | 2 refills | Status: DC

## 2022-08-17 MED ORDER — PREDNISONE 20 MG PO TABS: ORAL_TABLET | ORAL | 0 refills | Status: DC

## 2022-08-17 MED ORDER — HYDROCODONE-ACETAMINOPHEN 5-325 MG PO TABS: 1.0000 | ORAL_TABLET | Freq: Four times a day (QID) | ORAL | 0 refills | Status: DC | PRN

## 2022-08-17 NOTE — Assessment & Plan Note (Signed)
  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 will extend taper for that and continue it longer due to back pain flare. Stop lifting on dad.   Get XR Try to do physical therapy  Encouraged continuing with gabapentin/zanaflex

## 2022-08-17 NOTE — Progress Notes (Signed)
[facility]: [phone]   Routine Medical Office Visit  Patient:  Laurie Roman      Age: 47 y.o.       Sex:  female  Date:   08/17/2022  PCP:    Tawnya Crook, MD   [place] Provider: Loralee Pacas, MD   Assessment and Plan:   Katrinka was seen today for sciatic nerve pain, right side chest pain and left side breast bone pain.  Left sided sciatica Overview: Since 2017 Long term management with high dose gabapentin three times daily, zanaflex at night  '4mg'$ , NSAIDs ibuprofen 800 q12 due to stomach can't handle more Used to following with pain management injurance wouldn't cover. 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 will extend taper for that and continue it longer due to back pain flare. Stop lifting on dad.   Get XR Try to do physical therapy  Encouraged continuing with gabapentin/zanaflex  Orders: -     predniSONE; Take 2 pills for 3 days, 1 pill for 4 days  Dispense: 10 tablet; Refill: 0  Acute left-sided low back pain with left-sided sciatica -     DG Lumbar Spine 2-3 Views; Future -     Celecoxib; Take 1 capsule (100 mg total) by mouth 2 (two) times daily.  Dispense: 60 capsule; Refill: 2 -     HYDROcodone-Acetaminophen; Take 1 tablet by mouth every 6 (six) hours as needed for moderate pain or severe pain.  Dispense: 28 tablet; Refill: 0 -     Ambulatory referral to Physical Therapy  Other chest pain Overview:  E  Assessment & Plan: EKG Interpretation  Date/Time:   today Ventricular Rate:   80 PR Interval:   15 QRS Duration:  normal QT Interval:   360  QTC Calculation:  395 R Axis:    normal Text Interpretation:   Normal sinus rhythm  She has no hip 3 of heart problems or blood clots and this started in the context of she thinks she slept  on her bed wrong and has had pain for about 3 days now.  She has no history of cardiac or blood clotting problems and so I believe any acute coronary syndrome is unnecessary to rule out at the ER.  I offered to send her to cardiology or the ER if she wants but suggest may be best to just take the pain medicines I am using for the back and give it some time and rest. Its non exertional so I would classify it as noncardiac musculoskeletal pain.  Orders: -     EKG 12-Lead  Opioid withdrawal ([place])   Recently reduced / tapered off opioid use after long term treatment(s) for dental pain.  Clinical Presentation:   The patient is a 47 y.o. female: Active Ambulatory Problems    Diagnosis Date Noted   Migraine 08/21/2017   ADHD 08/21/2017   Essential hypertension 08/21/2017   Mild obstructive sleep apnea 02/18/2017   Controlled substance agreement signed 10/13/2017   Gastroesophageal reflux disease without esophagitis 05/02/2014   Left sided sciatica 09/27/2015   Asthma 03/27/2018   Maxillary sinusitis 12/02/2018   Anxiety    Primary insomnia 09/15/2019   Depression, major, single episode, severe ([place]) 05/16/2020   Cervicalgia 03/27/2022   Chronic dental pain 06/29/2022   Other chest pain  08/17/2022   Resolved Ambulatory Problems    Diagnosis Date Noted   No Resolved Ambulatory Problems   Past Medical History:  Diagnosis Date   ADD (attention deficit disorder)    Blood transfusion without reported diagnosis    Depression    GERD (gastroesophageal reflux disease)    IBS (irritable bowel syndrome)    Insomnia    Lumbago    Sciatica    Sleep apnea    Tietze's disease     Outpatient Medications Prior to Visit  Medication Sig   albuterol (VENTOLIN HFA) 108 (90 Base) MCG/ACT inhaler Inhale 2 puffs into the lungs every 6 (six) hours as needed for wheezing or shortness of breath.   amLODipine (NORVASC) 10 MG tablet Take 1 tablet (10 mg total) by mouth daily. Replaces amlodipine 5, for  uncontrolled blood pressure.   amphetamine-dextroamphetamine (ADDERALL) 20 MG tablet Take 1 tablet (20 mg total) by mouth 2 (two) times daily.   budesonide-formoterol (SYMBICORT) 160-4.5 MCG/ACT inhaler Inhale 2 puffs into the lungs 2 (two) times daily.   chlorhexidine (PERIDEX) 0.12 % solution Use as directed 15 mLs in the mouth or throat 2 (two) times daily.   Erenumab-aooe (AIMOVIG) 140 MG/ML SOAJ Inject 140 mg into the skin every 30 (thirty) days.   escitalopram (LEXAPRO) 10 MG tablet Take 1 tablet (10 mg total) by mouth daily.   fluconazole (DIFLUCAN) 150 MG tablet Take 1 tablet (150 mg total) by mouth every three (3) days as needed.   fluconazole (DIFLUCAN) 150 MG tablet Take 1 tablet (150 mg total) by mouth every 3 (three) days as needed.   fluticasone (FLONASE) 50 MCG/ACT nasal spray Place 1 spray into both nostrils daily.   gabapentin (NEURONTIN) 600 MG tablet Take 2 tablets (1,200 mg total) by mouth 2 (two) times daily.   losartan (COZAAR) 100 MG tablet Take 1 tablet (100 mg total) by mouth daily.   nitrofurantoin, macrocrystal-monohydrate, (MACROBID) 100 MG capsule Take 1 capsule (100 mg total) by mouth 2 (two) times daily.   nystatin (MYCOSTATIN) 100000 UNIT/ML suspension Take 5 mLs (500,000 Units total) by mouth 4 (four) times daily. Swish and hold in mouth few seconds and swallow   pantoprazole (PROTONIX) 40 MG tablet Take 1 tablet (40 mg total) by mouth daily.   prazosin (MINIPRESS) 1 MG capsule Take 1 capsule (1 mg total) by mouth at bedtime.   rizatriptan (MAXALT) 10 MG tablet Take 1 tablet (10 mg total) by mouth as needed for migraine. May repeat in 2 hours if needed   tiZANidine (ZANAFLEX) 4 MG tablet Take 1 tablet (4 mg total) by mouth every 6 (six) hours as needed for muscle spasms.   VENTOLIN HFA 108 (90 Base) MCG/ACT inhaler Inhale 2 puffs into the lungs every 6 (six) hours as needed for wheezing or shortness of breath.   [DISCONTINUED] HYDROcodone-acetaminophen  (NORCO/VICODIN) 5-325 MG tablet Take 1 tablet by mouth every 6 (six) hours as needed for moderate pain or severe pain.   [DISCONTINUED] ketorolac (TORADOL) 10 MG tablet Take 10 mg by mouth every 6 (six) hours as needed.   No facility-administered medications prior to visit.     Chief Complaint  Patient presents with   Sciatic nerve pain   Right side chest pain   Left side breast bone pain    HPI   Main complaint is back pain, she is not much worried about the chest pain.  It seems noncardiac.  Both are linked to recently she has been caring for  elderly  father and having to lift on him. More details- Please see overview subsection of assessment/plan section for history updates.             Clinical Data Analysis:  Physical Exam  BP 100/74 (BP Location: Left Arm, Patient Position: Sitting)   Pulse 94   Temp (!) 97.3 F (36.3 C) (Temporal)   Ht '4\' 11"'$  (1.499 m)   Wt 138 lb (62.6 kg)   SpO2 97%   BMI 27.87 kg/m  Wt Readings from Last 10 Encounters:  08/17/22 138 lb (62.6 kg)  08/06/22 142 lb 4 oz (64.5 kg)  08/04/22 142 lb 4 oz (64.5 kg)  07/23/22 142 lb 6 oz (64.6 kg)  07/15/22 141 lb 6 oz (64.1 kg)  07/13/22 136 lb (61.7 kg)  07/07/22 134 lb 3.2 oz (60.9 kg)  05/22/22 138 lb 6 oz (62.8 kg)  05/11/22 138 lb (62.6 kg)  04/10/22 144 lb 8 oz (65.5 kg)   Vital signs reviewed.  Nursing notes reviewed. Weight trend reviewed. Abnormalities noted: weight loss BMI is an unreliable indicator of healthy body composition due to its inability to reflect lean muscle mass.  General Appearance:  Well developed, well nourished female in no acute distress.   Pulmonary:  Normal work of breathing at rest, no respiratory distress apparent. SpO2: 97 %  Musculoskeletal: All extremities are intact.  Neurological:  Awake, alert. No obvious focal neurological deficits or cognitive impairments.  Sensorium seems unclouded. Psychiatric:  Appropriate mood, pleasant demeanor Problem-specific  findings:  no grimace with sit to stand, walks without assistance. No dyspnea. Clear to auscultation bilaterally, no murmurs, rubs, or gallops with s1 and s2 heart sounds have regular rate and rhythm  no diaphoresis   Results Reviewed: (reviewed labs/imaging may be also be found in the assessment / plan section):     No results found for any visits on 08/17/22.  Recent Results (from the past 2160 hour(s))  POC COVID-19     Status: Normal   Collection Time: 07/15/22  5:00 PM  Result Value Ref Range   SARS Coronavirus 2 Ag Negative Negative  POCT Influenza A/B     Status: Normal   Collection Time: 07/15/22  5:01 PM  Result Value Ref Range   Influenza A, POC Negative Negative   Influenza B, POC Negative Negative  POCT urinalysis dipstick     Status: Abnormal   Collection Time: 08/04/22 12:03 PM  Result Value Ref Range   Color, UA YELLOW    Clarity, UA     Glucose, UA Negative Negative   Bilirubin, UA NEG    Ketones, UA NEG    Spec Grav, UA 1.025 1.010 - 1.025   Blood, UA POSITIVE    pH, UA 6.0 5.0 - 8.0   Protein, UA Positive (A) Negative   Urobilinogen, UA 0.2 0.2 or 1.0 E.U./dL   Nitrite, UA NEG    Leukocytes, UA Moderate (2+) (A) Negative   Appearance     Odor    Urine Culture     Status: Abnormal   Collection Time: 08/05/22  4:12 PM   Specimen: Urine  Result Value Ref Range   MICRO NUMBER: [id number]    SPECIMEN QUALITY: Adequate    Sample Source URINE    STATUS: FINAL    ISOLATE 1: ESBL Escherichia coli (A)     Comment: Greater than 100,000 CFU/mL of Escherichia coli (ESBL) ESBL RESULT:        The organism has been confirmed  as an Glass blower/designer.      Susceptibility   Esbl escherichia coli - URINE CULTURE, REFLEX    AMOX/CLAVULANIC >=32 Resistant     AMPICILLIN* >=32 Resistant      * Extended spectrum beta-lactamase (ESBL) producing organisms demonstrate decreased activity with penicillins, cephalosporins and aztreonam.     AMPICILLIN/SULBACTAM >=32 Resistant      CEFAZOLIN* 16 Resistant      * Extended spectrum beta-lactamase (ESBL) producing organisms demonstrate decreased activity with penicillins, cephalosporins and aztreonam. For uncomplicated UTI caused by E. coli, K. pneumoniae or P. mirabilis: Cefazolin is susceptible if MIC <32 mcg/mL and predicts susceptible to the oral agents cefaclor, cefdinir, cefpodoxime, cefprozil, cefuroxime, cephalexin and loracarbef.     CEFTAZIDIME 4 Sensitive     CEFEPIME <=1 Sensitive     CEFTRIAXONE <=1 Sensitive     CIPROFLOXACIN <=0.25 Sensitive     LEVOFLOXACIN <=0.12 Sensitive     GENTAMICIN <=1 Sensitive     IMIPENEM <=0.25 Sensitive     NITROFURANTOIN 32 Sensitive     PIP/TAZO 16 Sensitive     TOBRAMYCIN <=1 Sensitive     TRIMETH/SULFA* <=20 Sensitive      * Extended spectrum beta-lactamase (ESBL) producing organisms demonstrate decreased activity with penicillins, cephalosporins and aztreonam. For uncomplicated UTI caused by E. coli, K. pneumoniae or P. mirabilis: Cefazolin is susceptible if MIC <32 mcg/mL and predicts susceptible to the oral agents cefaclor, cefdinir, cefpodoxime, cefprozil, cefuroxime, cephalexin and loracarbef. Legend: S = Susceptible  I = Intermediate R = Resistant  NS = Not susceptible * = Not tested  NR = Not reported **NN = See antimicrobic comments   POCT Urinalysis Dipstick     Status: Abnormal   Collection Time: 08/06/22  4:51 PM  Result Value Ref Range   Color, UA cloudy    Clarity, UA yellow    Glucose, UA Negative Negative   Bilirubin, UA Negative    Ketones, UA Negative    Spec Grav, UA 1.025 1.010 - 1.025   Blood, UA 3+    pH, UA 6.0 5.0 - 8.0   Protein, UA Positive (A) Negative   Urobilinogen, UA 0.2 0.2 or 1.0 E.U./dL   Nitrite, UA Negative    Leukocytes, UA Moderate (2+) (A) Negative   Appearance     Odor      No image results found.        Signed: Loralee Pacas, MD 08/17/2022 8:30 PM

## 2022-08-17 NOTE — Assessment & Plan Note (Addendum)
EKG Interpretation  Date/Time:   today Ventricular Rate:   80 PR Interval:   15 QRS Duration:  normal QT Interval:   360  QTC Calculation:  395 R Axis:    normal Text Interpretation:   Normal sinus rhythm  She has no hip 3 of heart problems or blood clots and this started in the context of she thinks she slept on her bed wrong and has had pain for about 3 days now.  She has no history of cardiac or blood clotting problems and so I believe any acute coronary syndrome is unnecessary to rule out at the ER.  I offered to send her to cardiology or the ER if she wants but suggest may be best to just take the pain medicines I am using for the back and give it some time and rest. Its non exertional so I would classify it as noncardiac musculoskeletal pain.

## 2022-08-18 DIAGNOSIS — E611 Iron deficiency: Secondary | ICD-10-CM | POA: Diagnosis not present

## 2022-08-18 DIAGNOSIS — I10 Essential (primary) hypertension: Secondary | ICD-10-CM | POA: Diagnosis not present

## 2022-08-18 DIAGNOSIS — R0789 Other chest pain: Secondary | ICD-10-CM | POA: Diagnosis not present

## 2022-08-18 DIAGNOSIS — E538 Deficiency of other specified B group vitamins: Secondary | ICD-10-CM | POA: Diagnosis not present

## 2022-08-18 NOTE — Addendum Note (Signed)
Addended by: Clois Dupes D on: 08/18/2022 04:58 PM   Modules accepted: Orders

## 2022-08-18 NOTE — Addendum Note (Signed)
Addended by: Clois Dupes D on: 08/18/2022 04:53 PM   Modules accepted: Orders

## 2022-08-18 NOTE — Addendum Note (Signed)
Addended by: Clois Dupes D on: 08/18/2022 04:57 PM   Modules accepted: Orders

## 2022-08-19 ENCOUNTER — Other Ambulatory Visit: Payer: Self-pay | Admitting: *Deleted

## 2022-08-19 LAB — CBC WITH DIFFERENTIAL/PLATELET
Absolute Monocytes: 688 cells/uL (ref 200–950)
Basophils Absolute: 102 cells/uL (ref 0–200)
Basophils Relative: 1.1 %
Eosinophils Absolute: 233 cells/uL (ref 15–500)
Eosinophils Relative: 2.5 %
HCT: 35.4 % (ref 35.0–45.0)
Hemoglobin: 12.2 g/dL (ref 11.7–15.5)
Lymphs Abs: 3906 cells/uL — ABNORMAL HIGH (ref 850–3900)
MCH: 30.4 pg (ref 27.0–33.0)
MCHC: 34.5 g/dL (ref 32.0–36.0)
MCV: 88.3 fL (ref 80.0–100.0)
MPV: 11.3 fL (ref 7.5–12.5)
Monocytes Relative: 7.4 %
Neutro Abs: 4371 cells/uL (ref 1500–7800)
Neutrophils Relative %: 47 %
Platelets: 314 10*3/uL (ref 140–400)
RBC: 4.01 10*6/uL (ref 3.80–5.10)
RDW: 12.5 % (ref 11.0–15.0)
Total Lymphocyte: 42 %
WBC: 9.3 10*3/uL (ref 3.8–10.8)

## 2022-08-19 LAB — COMPREHENSIVE METABOLIC PANEL
AG Ratio: 1.9 (calc) (ref 1.0–2.5)
ALT: 11 U/L (ref 6–29)
AST: 17 U/L (ref 10–35)
Albumin: 5 g/dL (ref 3.6–5.1)
Alkaline phosphatase (APISO): 77 U/L (ref 31–125)
BUN: 19 mg/dL (ref 7–25)
CO2: 23 mmol/L (ref 20–32)
Calcium: 9.8 mg/dL (ref 8.6–10.2)
Chloride: 105 mmol/L (ref 98–110)
Creat: 0.59 mg/dL (ref 0.50–0.99)
Globulin: 2.7 g/dL (calc) (ref 1.9–3.7)
Glucose, Bld: 84 mg/dL (ref 65–99)
Potassium: 3.7 mmol/L (ref 3.5–5.3)
Sodium: 141 mmol/L (ref 135–146)
Total Bilirubin: 0.3 mg/dL (ref 0.2–1.2)
Total Protein: 7.7 g/dL (ref 6.1–8.1)

## 2022-08-19 LAB — D-DIMER, QUANTITATIVE: D-Dimer, Quant: 0.22 mcg/mL FEU (ref <0.50)

## 2022-08-19 LAB — VITAMIN B12: Vitamin B-12: 592 pg/mL (ref 200–1100)

## 2022-08-19 MED ORDER — TIZANIDINE HCL 4 MG PO TABS: 4.0000 mg | ORAL_TABLET | Freq: Four times a day (QID) | ORAL | 3 refills | Status: DC | PRN

## 2022-08-19 MED ORDER — AIMOVIG 140 MG/ML ~~LOC~~ SOAJ: 140.0000 mg | SUBCUTANEOUS | 2 refills | Status: DC

## 2022-08-21 ENCOUNTER — Other Ambulatory Visit (HOSPITAL_BASED_OUTPATIENT_CLINIC_OR_DEPARTMENT_OTHER): Payer: Self-pay

## 2022-08-21 ENCOUNTER — Ambulatory Visit (INDEPENDENT_AMBULATORY_CARE_PROVIDER_SITE_OTHER): Payer: Medicaid Other | Admitting: Internal Medicine

## 2022-08-21 ENCOUNTER — Encounter: Payer: Self-pay | Admitting: Internal Medicine

## 2022-08-21 VITALS — BP 120/90 | HR 90 | Temp 98.1°F | Ht 59.0 in | Wt 140.0 lb

## 2022-08-21 DIAGNOSIS — R0789 Other chest pain: Secondary | ICD-10-CM

## 2022-08-21 DIAGNOSIS — M5432 Sciatica, left side: Secondary | ICD-10-CM | POA: Diagnosis not present

## 2022-08-21 DIAGNOSIS — M5442 Lumbago with sciatica, left side: Secondary | ICD-10-CM

## 2022-08-21 DIAGNOSIS — I10 Essential (primary) hypertension: Secondary | ICD-10-CM

## 2022-08-21 DIAGNOSIS — F1123 Opioid dependence with withdrawal: Secondary | ICD-10-CM

## 2022-08-21 DIAGNOSIS — F1120 Opioid dependence, uncomplicated: Secondary | ICD-10-CM | POA: Insufficient documentation

## 2022-08-21 HISTORY — DX: Opioid dependence, uncomplicated: F11.20

## 2022-08-21 MED ORDER — HYDROCODONE-ACETAMINOPHEN 7.5-300 MG PO TABS
1.0000 | ORAL_TABLET | Freq: Four times a day (QID) | ORAL | 0 refills | Status: DC
  Filled 2022-08-21: qty 20, 5d supply, fill #0

## 2022-08-21 MED ORDER — GABAPENTIN 800 MG PO TABS: 800.0000 mg | ORAL_TABLET | Freq: Three times a day (TID) | ORAL | 0 refills | Status: DC

## 2022-08-21 MED ORDER — HYDROCODONE-ACETAMINOPHEN 7.5-325 MG PO TABS
1.0000 | ORAL_TABLET | Freq: Four times a day (QID) | ORAL | 0 refills | Status: DC | PRN
  Filled 2022-08-21: qty 20, 5d supply, fill #0

## 2022-08-21 MED ORDER — HYDROCODONE-ACETAMINOPHEN 7.5-300 MG PO TABS: 1.0000 | ORAL_TABLET | Freq: Four times a day (QID) | ORAL | 0 refills | Status: DC

## 2022-08-21 MED ORDER — KETOROLAC TROMETHAMINE 60 MG/2ML IM SOLN
60.0000 mg | Freq: Once | INTRAMUSCULAR | Status: AC
  Administered 2022-08-21: 60 mg via INTRAMUSCULAR

## 2022-08-21 MED ORDER — GABAPENTIN 800 MG PO TABS
800.0000 mg | ORAL_TABLET | Freq: Three times a day (TID) | ORAL | 0 refills | Status: DC
  Filled 2022-08-21: qty 90, 30d supply, fill #0

## 2022-08-21 MED ORDER — DULOXETINE HCL 30 MG PO CPEP: ORAL_CAPSULE | ORAL | 3 refills | Status: DC

## 2022-08-21 MED ORDER — DULOXETINE HCL 30 MG PO CPEP
ORAL_CAPSULE | ORAL | 3 refills | Status: DC
  Filled 2022-08-21: qty 60, 30d supply, fill #0

## 2022-08-21 NOTE — Assessment & Plan Note (Addendum)
Poorly controlled sciatic pain down left leg, acutely flared secondary to lifting. Also burns in tail bone Flared with lifting on dad I tried Celebrex but sulfa allergy got her, so attempt at tapering hydrocodone failed, He has been on hydrocodone several times daily for 4-5 months so she has magnified pain from attempts to discontinue She has been doing 800 ibuprofen twice daily, encouraged patient to to continue that but go to three times daily  The attempt at referral still pending for physical therapy, I do think that will help if she will stop lifting on dad Encouraged patient to continue with gabapentin, she was tapering down but she needs to go back up on. Also advised patient to see Primary Care Provider (PCP) and discuss Lyrica because its better.  During our consultation on 08/21/2022, while exploring treatment options that align with her values/preferences, we had a detailed discussion of the risks of using higher dose hydrocodone when we are trying to taper off opioids after 4 months and so I  recommended buprenorphine..  To facilitate shared decision-making, I used motivational interviewing, but respected her autonomy for self-directed care while adhering to professional standards and ethical principles.  I ensured: her understanding of the additional risks of the non-recommended approach by asking clarifying questions and summarizing key points. her capacity to evaluate these risks and decide for herself - she demonstrated the ability to understand information, appreciate consequences, and make rational decisions today. Despite my recommendations, she continued to prefer her chosen treatment. Following these discussions, I agreed to her preferred approach of increased dose hydrocodone, after repeating my concerns about the risks. Dose was limited to 5 days.  I explained that I would document this discussion in her chart in lieu of written/signed informed consent due to the low-risk nature of  the intervention.  she verbally agreed to this.  This document will be immediately and permanently available on her chart for review, allowing her to contest or clarify any element of this documented consent through a MyChart message.   Encouraged patient to rest offered off work, advised patient to get more home health for her father Patient reports tizanidine only at night due to sedation. Due to most pain is neuropathic I won't adjust  Advised patient to do pain management referral for infection - she needs to check insurance options first Advised patient try lidocaine rubs she already has

## 2022-08-21 NOTE — Assessment & Plan Note (Addendum)
   Patient reports that recent hydrocodone is necessary and insufficient to manage pain, would like me to increase dose so she can early fill today Advised patient that we need to start reducing, During our consultation on 08/21/2022, while exploring treatment options that align with her values/preferences, we had a detailed discussion of the risks of using hydrocodone (overdose, addiction).  To facilitate shared decision-making, I used motivational interviewing, but respected her autonomy for self-directed care while adhering to professional standards and ethical principles.  I ensured: her understanding of the additional risks of the non-recommended approach by asking clarifying questions and summarizing key points. her capacity to evaluate these risks and decide for herself - she demonstrated the ability to understand information, appreciate consequences, and make rational decisions today. Despite my recommendations, she continued to prefer her chosen treatment. Following these discussions, I agreed to her preferred approach, after repeating my concerns about the risks  .  I explained that I would, if she wishes for further continuation of opioids in a soon follow up appointment, be willing to write buprenorphine instead of norco for her pain and opioid dependence, as I think this is more appropriate for chronic pain management and opioid dependence..  I acknowledge her self report that has not developed opioid use disorder and I have no reason to believe that she has developed this condition.

## 2022-08-21 NOTE — Assessment & Plan Note (Addendum)
Resolved, updated problem list accordingly  Attributing to rib strain lifting on dad after negative ddimer and ekg

## 2022-08-21 NOTE — Progress Notes (Signed)
[facility]: [phone]   Routine Medical Office Visit  Patient:  Laurie Roman      Age: 47 y.o.       Sex:  female  Date:   08/21/2022  PCP:    Tawnya Crook, MD   [place] Provider: Loralee Pacas, MD   Assessment and Plan:   Zacaria was seen today for follow-up, tailbone pain, left leg numb and chest pain.  Left sided sciatica Overview: Since 2017 Long term management with high dose gabapentin three times daily, zanaflex at night  4mg , NSAIDs ibuprofen 800 q12 due to stomach can't handle more Used to following with pain management injurance wouldn't cover. Real bad rash with sulfa, can't do Celebrex, but agreeable to give try anyway  because ibuprofen hurts stomac  Assessment & Plan: Poorly controlled sciatic pain down left leg, acutely flared secondary to lifting. Also burns in tail bone Flared with lifting on dad I tried Celebrex but sulfa allergy got her, so attempt at tapering hydrocodone failed, He has been on hydrocodone several times daily for 4-5 months so she has magnified pain from attempts to discontinue She has been doing 800 ibuprofen twice daily, encouraged patient to to continue that but go to three times daily  The attempt at referral still pending for physical therapy, I do think that will help if she will stop lifting on dad Encouraged patient to continue with gabapentin, she was tapering down but she needs to go back up on. Also advised patient to see Primary Care Provider (PCP) and discuss Lyrica because its better.  During our consultation on 08/21/2022, while exploring treatment options that align with her values/preferences, we had a detailed discussion of the risks of using higher dose hydrocodone when we are trying to taper off opioids after 4 months and so I  recommended buprenorphine..  To facilitate shared decision-making, I used motivational interviewing, but respected her autonomy for self-directed care while  adhering to professional standards and ethical principles.  I ensured: her understanding of the additional risks of the non-recommended approach by asking clarifying questions and summarizing key points. her capacity to evaluate these risks and decide for herself - she demonstrated the ability to understand information, appreciate consequences, and make rational decisions today. Despite my recommendations, she continued to prefer her chosen treatment. Following these discussions, I agreed to her preferred approach of increased dose hydrocodone, after repeating my concerns about the risks. Dose was limited to 5 days.  I explained that I would document this discussion in her chart in lieu of written/signed informed consent due to the low-risk nature of the intervention.  she verbally agreed to this.  This document will be immediately and permanently available on her chart for review, allowing her to contest or clarify any element of this documented consent through a MyChart message.   Encouraged patient to rest offered off work, advised patient to get more home health for her father Patient reports tizanidine only at night due to sedation. Due to most pain is neuropathic I won't adjust  Advised patient to do pain management referral for infection - she needs to check insurance options first Advised patient try lidocaine rubs she already has      Orders: -     DULoxetine HCl; Take one 30 mg tablet by mouth once a day for the first week. Then increase to two 30 mg tablets ( total 60mg ) by mouth once daily.  Dispense: 60 capsule; Refill: 3 -  Gabapentin; Take 1 tablet (800 mg total) by mouth 3 (three) times daily. Replaces prior dosing schedule.  Dispense: 90 tablet; Refill: 0 -     Ambulatory referral to Pain Clinic -     HYDROcodone-Acetaminophen; Take 1 tablet by mouth every 6 (six) hours as needed for moderate pain.  Dispense: 20 tablet; Refill: 0  Essential hypertension  Other chest  pain Overview: Normal EKG and D-dimer Associated with lifting on father Doesn't radiate Nonexertional Not substernal chest pain more to the side, feels related to sleeping on it wrong (reproducible)  Assessment & Plan: Resolved, updated problem list accordingly  Attributing to rib strain lifting on dad after negative ddimer and ekg   Opioid dependence with withdrawal ([place]) Overview: Explained 08/21/22 that 4 months hydrocodone for pain management for delays in dental care has resulted in hyperalgesia and dependence Her dependence is iatrogenic (caused by medical need for pain management for prolonged dental pain with unavoidable delays in dental care)  Assessment & Plan:   Patient reports that recent hydrocodone is necessary and insufficient to manage pain, would like me to increase dose so she can early fill today Advised patient that we need to start reducing, During our consultation on 08/21/2022, while exploring treatment options that align with her values/preferences, we had a detailed discussion of the risks of using hydrocodone (overdose, addiction).  To facilitate shared decision-making, I used motivational interviewing, but respected her autonomy for self-directed care while adhering to professional standards and ethical principles.  I ensured: her understanding of the additional risks of the non-recommended approach by asking clarifying questions and summarizing key points. her capacity to evaluate these risks and decide for herself - she demonstrated the ability to understand information, appreciate consequences, and make rational decisions today. Despite my recommendations, she continued to prefer her chosen treatment. Following these discussions, I agreed to her preferred approach, after repeating my concerns about the risks  .  I explained that I would, if she wishes for further continuation of opioids in a soon follow up appointment, be willing to write buprenorphine instead of  norco for her pain and opioid dependence, as I think this is more appropriate for chronic pain management and opioid dependence..  I acknowledge her self report that has not developed opioid use disorder and I have no reason to believe that she has developed this condition.    Acute left-sided low back pain with left-sided sciatica -     Ketorolac Tromethamine     \    Clinical Presentation:   The patient is a 47 y.o. female: Active Ambulatory Problems    Diagnosis Date Noted   Migraine 08/21/2017   ADHD 08/21/2017   Essential hypertension 08/21/2017   Mild obstructive sleep apnea 02/18/2017   Controlled substance agreement signed 10/13/2017   Gastroesophageal reflux disease without esophagitis 05/02/2014   Left sided sciatica 09/27/2015   Asthma 03/27/2018   Maxillary sinusitis 12/02/2018   Anxiety    Primary insomnia 09/15/2019   Depression, major, single episode, severe ([place]) 05/16/2020   Cervicalgia 03/27/2022   Chronic dental pain 06/29/2022   Opioid dependence ([place]) 08/21/2022   Resolved Ambulatory Problems    Diagnosis Date Noted   Other chest pain 08/17/2022   Past Medical History:  Diagnosis Date   ADD (attention deficit disorder)    Blood transfusion without reported diagnosis    Depression    GERD (gastroesophageal reflux disease)    IBS (irritable bowel syndrome)    Insomnia    Lumbago  Sciatica    Sleep apnea    Tietze's disease     Outpatient Medications Prior to Visit  Medication Sig   albuterol (VENTOLIN HFA) 108 (90 Base) MCG/ACT inhaler Inhale 2 puffs into the lungs every 6 (six) hours as needed for wheezing or shortness of breath.   amLODipine (NORVASC) 10 MG tablet Take 1 tablet (10 mg total) by mouth daily. Replaces amlodipine 5, for uncontrolled blood pressure.   amphetamine-dextroamphetamine (ADDERALL) 20 MG tablet Take 1 tablet (20 mg total) by mouth 2 (two) times daily.   budesonide-formoterol (SYMBICORT) 160-4.5 MCG/ACT inhaler Inhale  2 puffs into the lungs 2 (two) times daily.   celecoxib (CELEBREX) 100 MG capsule Take 1 capsule (100 mg total) by mouth 2 (two) times daily.   chlorhexidine (PERIDEX) 0.12 % solution Use as directed 15 mLs in the mouth or throat 2 (two) times daily.   Erenumab-aooe (AIMOVIG) 140 MG/ML SOAJ Inject 140 mg into the skin every 30 (thirty) days.   escitalopram (LEXAPRO) 10 MG tablet Take 1 tablet (10 mg total) by mouth daily.   fluconazole (DIFLUCAN) 150 MG tablet Take 1 tablet (150 mg total) by mouth every three (3) days as needed.   fluconazole (DIFLUCAN) 150 MG tablet Take 1 tablet (150 mg total) by mouth every 3 (three) days as needed.   fluticasone (FLONASE) 50 MCG/ACT nasal spray Place 1 spray into both nostrils daily.   losartan (COZAAR) 100 MG tablet Take 1 tablet (100 mg total) by mouth daily.   nitrofurantoin, macrocrystal-monohydrate, (MACROBID) 100 MG capsule Take 1 capsule (100 mg total) by mouth 2 (two) times daily.   nystatin (MYCOSTATIN) 100000 UNIT/ML suspension Take 5 mLs (500,000 Units total) by mouth 4 (four) times daily. Swish and hold in mouth few seconds and swallow   pantoprazole (PROTONIX) 40 MG tablet Take 1 tablet (40 mg total) by mouth daily.   prazosin (MINIPRESS) 1 MG capsule Take 1 capsule (1 mg total) by mouth at bedtime.   predniSONE (DELTASONE) 20 MG tablet Take 2 pills for 3 days, 1 pill for 4 days   rizatriptan (MAXALT) 10 MG tablet Take 1 tablet (10 mg total) by mouth as needed for migraine. May repeat in 2 hours if needed   tiZANidine (ZANAFLEX) 4 MG tablet Take 1 tablet (4 mg total) by mouth every 6 (six) hours as needed for muscle spasms.   VENTOLIN HFA 108 (90 Base) MCG/ACT inhaler Inhale 2 puffs into the lungs every 6 (six) hours as needed for wheezing or shortness of breath.   [DISCONTINUED] gabapentin (NEURONTIN) 600 MG tablet Take 2 tablets (1,200 mg total) by mouth 2 (two) times daily.   [DISCONTINUED] HYDROcodone-acetaminophen (NORCO/VICODIN) 5-325 MG  tablet Take 1 tablet by mouth every 6 (six) hours as needed for moderate pain or severe pain.   No facility-administered medications prior to visit.     Chief Complaint  Patient presents with   Follow-up   Tailbone Pain   Left leg numb   Chest Pain    Not as bad.    HPI           Clinical Data Analysis:  Physical Exam  BP (!) 120/90 (BP Location: Right Arm, Patient Position: Sitting)   Pulse 90   Temp 98.1 F (36.7 C) (Temporal)   Ht 4\' 11"  ([id number] m)   Wt 140 lb (63.5 kg)   SpO2 96%   BMI 28.28 kg/m  Wt Readings from Last 10 Encounters:  08/21/22 140 lb (63.5 kg)  08/17/22  138 lb (62.6 kg)  08/06/22 142 lb 4 oz (64.5 kg)  08/04/22 142 lb 4 oz (64.5 kg)  07/23/22 142 lb 6 oz (64.6 kg)  07/15/22 141 lb 6 oz (64.1 kg)  07/13/22 136 lb (61.7 kg)  07/07/22 134 lb 3.2 oz (60.9 kg)  05/22/22 138 lb 6 oz (62.8 kg)  05/11/22 138 lb (62.6 kg)   Vital signs reviewed.  Nursing notes reviewed. Weight trend reviewed. Abnormalities noted: limp BMI is an unreliable indicator of healthy body composition due to its inability to reflect lean muscle mass.  General Appearance:  Well developed, well nourished female in no acute distress.   Pulmonary:  Normal work of breathing at rest, no respiratory distress apparent. SpO2: 96 %  Musculoskeletal: All extremities are intact.  Neurological:  Awake, alert. No obvious focal neurological deficits or cognitive impairments.  Sensorium seems unclouded. Psychiatric:  Appropriate mood, pleasant demeanor Problem-specific findings:  limping when unaware of observation, uncomfortable appearing.   Results Reviewed: (reviewed labs/imaging may be also be found in the assessment / plan section):     No results found for any visits on 08/21/22.  Recent Results (from the past 2160 hour(s))  POC COVID-19     Status: Normal   Collection Time: 07/15/22  5:00 PM  Result Value Ref Range   SARS Coronavirus 2 Ag Negative Negative  POCT Influenza A/B      Status: Normal   Collection Time: 07/15/22  5:01 PM  Result Value Ref Range   Influenza A, POC Negative Negative   Influenza B, POC Negative Negative  POCT urinalysis dipstick     Status: Abnormal   Collection Time: 08/04/22 12:03 PM  Result Value Ref Range   Color, UA YELLOW    Clarity, UA     Glucose, UA Negative Negative   Bilirubin, UA NEG    Ketones, UA NEG    Spec Grav, UA 1.025 1.010 - 1.025   Blood, UA POSITIVE    pH, UA 6.0 5.0 - 8.0   Protein, UA Positive (A) Negative   Urobilinogen, UA 0.2 0.2 or 1.0 E.U./dL   Nitrite, UA NEG    Leukocytes, UA Moderate (2+) (A) Negative   Appearance     Odor    Urine Culture     Status: Abnormal   Collection Time: 08/05/22  4:12 PM   Specimen: Urine  Result Value Ref Range   MICRO NUMBER: [id number]    SPECIMEN QUALITY: Adequate    Sample Source URINE    STATUS: FINAL    ISOLATE 1: ESBL Escherichia coli (A)     Comment: Greater than 100,000 CFU/mL of Escherichia coli (ESBL) ESBL RESULT:        The organism has been confirmed as an ESBL producer.      Susceptibility   Esbl escherichia coli - URINE CULTURE, REFLEX    AMOX/CLAVULANIC >=32 Resistant     AMPICILLIN* >=32 Resistant      * Extended spectrum beta-lactamase (ESBL) producing organisms demonstrate decreased activity with penicillins, cephalosporins and aztreonam.     AMPICILLIN/SULBACTAM >=32 Resistant     CEFAZOLIN* 16 Resistant      * Extended spectrum beta-lactamase (ESBL) producing organisms demonstrate decreased activity with penicillins, cephalosporins and aztreonam. For uncomplicated UTI caused by E. coli, K. pneumoniae or P. mirabilis: Cefazolin is susceptible if MIC <32 mcg/mL and predicts susceptible to the oral agents cefaclor, cefdinir, cefpodoxime, cefprozil, cefuroxime, cephalexin and loracarbef.     CEFTAZIDIME 4 Sensitive  CEFEPIME <=1 Sensitive     CEFTRIAXONE <=1 Sensitive     CIPROFLOXACIN <=0.25 Sensitive     LEVOFLOXACIN <=0.12 Sensitive      GENTAMICIN <=1 Sensitive     IMIPENEM <=0.25 Sensitive     NITROFURANTOIN 32 Sensitive     PIP/TAZO 16 Sensitive     TOBRAMYCIN <=1 Sensitive     TRIMETH/SULFA* <=20 Sensitive      * Extended spectrum beta-lactamase (ESBL) producing organisms demonstrate decreased activity with penicillins, cephalosporins and aztreonam. For uncomplicated UTI caused by E. coli, K. pneumoniae or P. mirabilis: Cefazolin is susceptible if MIC <32 mcg/mL and predicts susceptible to the oral agents cefaclor, cefdinir, cefpodoxime, cefprozil, cefuroxime, cephalexin and loracarbef. Legend: S = Susceptible  I = Intermediate R = Resistant  NS = Not susceptible * = Not tested  NR = Not reported **NN = See antimicrobic comments   POCT Urinalysis Dipstick     Status: Abnormal   Collection Time: 08/06/22  4:51 PM  Result Value Ref Range   Color, UA cloudy    Clarity, UA yellow    Glucose, UA Negative Negative   Bilirubin, UA Negative    Ketones, UA Negative    Spec Grav, UA 1.025 1.010 - 1.025   Blood, UA 3+    pH, UA 6.0 5.0 - 8.0   Protein, UA Positive (A) Negative   Urobilinogen, UA 0.2 0.2 or 1.0 E.U./dL   Nitrite, UA Negative    Leukocytes, UA Moderate (2+) (A) Negative   Appearance     Odor    B12     Status: None   Collection Time: 08/18/22  4:59 PM  Result Value Ref Range   Vitamin B-12 592 200 - 1,100 pg/mL  Comp Met (CMET)     Status: None   Collection Time: 08/18/22  4:59 PM  Result Value Ref Range   Glucose, Bld 84 65 - 99 mg/dL    Comment: .            Fasting reference interval .    BUN 19 7 - 25 mg/dL   Creat 0.59 0.50 - 0.99 mg/dL   BUN/Creatinine Ratio SEE NOTE: 6 - 22 (calc)    Comment:    Not Reported: BUN and Creatinine are within    reference range. .    Sodium 141 135 - 146 mmol/L   Potassium 3.7 3.5 - 5.3 mmol/L   Chloride 105 98 - 110 mmol/L   CO2 23 20 - 32 mmol/L   Calcium 9.8 8.6 - 10.2 mg/dL   Total Protein 7.7 6.1 - 8.1 g/dL   Albumin 5.0 3.6 - 5.1  g/dL   Globulin 2.7 1.9 - 3.7 g/dL (calc)   AG Ratio 1.9 1.0 - 2.5 (calc)   Total Bilirubin 0.3 0.2 - 1.2 mg/dL   Alkaline phosphatase (APISO) 77 31 - 125 U/L   AST 17 10 - 35 U/L   ALT 11 6 - 29 U/L  CBC with Differential/Platelet     Status: Abnormal   Collection Time: 08/18/22  4:59 PM  Result Value Ref Range   WBC 9.3 3.8 - 10.8 Thousand/uL   RBC 4.01 3.80 - 5.10 Million/uL   Hemoglobin 12.2 11.7 - 15.5 g/dL   HCT 35.4 35.0 - 45.0 %   MCV 88.3 80.0 - 100.0 fL   MCH 30.4 27.0 - 33.0 pg   MCHC 34.5 32.0 - 36.0 g/dL   RDW 12.5 11.0 - 15.0 %   Platelets 314  140 - 400 Thousand/uL   MPV 11.3 7.5 - 12.5 fL   Neutro Abs 4,371 1,500 - 7,800 cells/uL   Lymphs Abs 3,906 (H) 850 - 3,900 cells/uL   Absolute Monocytes 688 200 - 950 cells/uL   Eosinophils Absolute 233 15 - 500 cells/uL   Basophils Absolute 102 0 - 200 cells/uL   Neutrophils Relative % 47 %   Total Lymphocyte 42.0 %   Monocytes Relative 7.4 %   Eosinophils Relative 2.5 %   Basophils Relative 1.1 %  D-Dimer, Quantitative     Status: None   Collection Time: 08/18/22  4:59 PM  Result Value Ref Range   D-Dimer, Quant 0.22 <0.50 mcg/mL FEU    Comment: . The D-Dimer test is used frequently to exclude an acute PE or DVT. In patients with a low to moderate clinical risk assessment and a D-Dimer result <0.50 mcg/mL FEU, the likelihood of a PE or DVT is very low. However, a thromboembolic event should not be excluded solely on the basis of the D-Dimer level. Increased levels of D-Dimer are associated with a PE, DVT, DIC, malignancies, inflammation, sepsis, surgery, trauma, pregnancy, and advancing patient age. [Jama 2006 11:295(2):199-207] . For additional information, please refer to: http://education.[facility].com/faq/FAQ149 (This link is being provided for informational/ educational purposes only) .     No image results found.        Signed: Loralee Pacas, MD 08/21/2022 7:11 PM

## 2022-08-26 ENCOUNTER — Other Ambulatory Visit (HOSPITAL_BASED_OUTPATIENT_CLINIC_OR_DEPARTMENT_OTHER): Payer: Self-pay

## 2022-08-26 ENCOUNTER — Ambulatory Visit (INDEPENDENT_AMBULATORY_CARE_PROVIDER_SITE_OTHER): Payer: Medicaid Other | Admitting: Family Medicine

## 2022-08-26 ENCOUNTER — Encounter: Payer: Self-pay | Admitting: Family Medicine

## 2022-08-26 VITALS — BP 124/82 | HR 90 | Temp 98.2°F | Ht 59.0 in | Wt 144.0 lb

## 2022-08-26 DIAGNOSIS — F419 Anxiety disorder, unspecified: Secondary | ICD-10-CM

## 2022-08-26 DIAGNOSIS — I10 Essential (primary) hypertension: Secondary | ICD-10-CM | POA: Diagnosis not present

## 2022-08-26 DIAGNOSIS — M5432 Sciatica, left side: Secondary | ICD-10-CM

## 2022-08-26 MED ORDER — HYDROCODONE-ACETAMINOPHEN 7.5-325 MG PO TABS
1.0000 | ORAL_TABLET | Freq: Four times a day (QID) | ORAL | 0 refills | Status: DC | PRN
  Filled 2022-08-26: qty 20, 5d supply, fill #0

## 2022-08-26 NOTE — Progress Notes (Signed)
Subjective:     Patient ID: Laurie Roman, female    DOB: 22-Sep-1975, 47 y.o.   MRN: [id number]  Chief Complaint  Patient presents with   Sciatic Nerve Pain    Left sided sciatic nerve pain    HPI  HTN-doing well on meds.  Less highs.  Sciatica on L-pain clinic on Monday 3/25. Lifting Dad-exacerbated.  Taking meds 3/d.  Out.  Needs more to get thru till Pain mgmt.  Taking 1/2 tizanidine at hs-makes drowsy.  Paresthias whole leg now. Leg giving out some. Tired a lot-not sleeping well.  Taking care of Dad,  pain.  Not started prazosin Moods-fair. Anx some better on lexapro.  Not as sad.   There are no preventive care reminders to display for this patient.  Past Medical History:  Diagnosis Date   ADD (attention deficit disorder)    Anxiety    Asthma    Blood transfusion without reported diagnosis    Depression    GERD (gastroesophageal reflux disease)    IBS (irritable bowel syndrome)    Insomnia    Lumbago    Migraine    Opioid dependence ([place]) 08/21/2022   Explained 08/21/22 that 4 months hydrocodone for pain management for delays in dental care has resulted in hyperalgesia and dependence   Other chest pain 08/17/2022   Normal EKG and D-dimer  Associated with lifting on father Doesn't radiate  Nonexertional  Not substernal chest pain more to the side, feels related to sleeping on it wrong (reproducible)   Sciatica    Sleep apnea    Tietze's disease     Past Surgical History:  Procedure Laterality Date   APPENDECTOMY     CESAREAN SECTION     TONSILLECTOMY     TOTAL ABDOMINAL HYSTERECTOMY W/ BILATERAL SALPINGOOPHORECTOMY     TUBAL LIGATION      Outpatient Medications Prior to Visit  Medication Sig Dispense Refill   albuterol (VENTOLIN HFA) 108 (90 Base) MCG/ACT inhaler Inhale 2 puffs into the lungs every 6 (six) hours as needed for wheezing or shortness of breath. 18 g 1   amLODipine (NORVASC) 10 MG tablet Take 1 tablet (10 mg total) by mouth daily. Replaces  amlodipine 5, for uncontrolled blood pressure. 90 tablet 3   amphetamine-dextroamphetamine (ADDERALL) 20 MG tablet Take 1 tablet (20 mg total) by mouth 2 (two) times daily. 60 tablet 0   budesonide-formoterol (SYMBICORT) 160-4.5 MCG/ACT inhaler Inhale 2 puffs into the lungs 2 (two) times daily. 1 each 3   chlorhexidine (PERIDEX) 0.12 % solution Use as directed 15 mLs in the mouth or throat 2 (two) times daily. 1893 mL 0   DULoxetine (CYMBALTA) 30 MG capsule Take one 30 mg tablet by mouth once a day for the first week. Then increase to two 30 mg tablets ( total 60mg ) by mouth once daily. 60 capsule 3   Erenumab-aooe (AIMOVIG) 140 MG/ML SOAJ Inject 140 mg into the skin every 30 (thirty) days. 1 mL 2   escitalopram (LEXAPRO) 10 MG tablet Take 1 tablet (10 mg total) by mouth daily. 30 tablet 1   fluconazole (DIFLUCAN) 150 MG tablet Take 1 tablet (150 mg total) by mouth every three (3) days as needed. 5 tablet 0   fluconazole (DIFLUCAN) 150 MG tablet Take 1 tablet (150 mg total) by mouth every 3 (three) days as needed. 2 tablet 1   fluticasone (FLONASE) 50 MCG/ACT nasal spray Place 1 spray into both nostrils daily. 16 g 0  gabapentin (NEURONTIN) 800 MG tablet Take 1 tablet (800 mg total) by mouth 3 (three) times daily. Replaces prior dosing schedule. 90 tablet 0   losartan (COZAAR) 100 MG tablet Take 1 tablet (100 mg total) by mouth daily. 90 tablet 0   nitrofurantoin, macrocrystal-monohydrate, (MACROBID) 100 MG capsule Take 1 capsule (100 mg total) by mouth 2 (two) times daily. 14 capsule 0   nystatin (MYCOSTATIN) 100000 UNIT/ML suspension Take 5 mLs (500,000 Units total) by mouth 4 (four) times daily. Swish and hold in mouth few seconds and swallow 60 mL 0   pantoprazole (PROTONIX) 40 MG tablet Take 1 tablet (40 mg total) by mouth daily. 90 tablet 0   prazosin (MINIPRESS) 1 MG capsule Take 1 capsule (1 mg total) by mouth at bedtime. 30 capsule 2   predniSONE (DELTASONE) 20 MG tablet Take 2 pills for 3  days, 1 pill for 4 days 10 tablet 0   rizatriptan (MAXALT) 10 MG tablet Take 1 tablet (10 mg total) by mouth as needed for migraine. May repeat in 2 hours if needed 10 tablet 0   tiZANidine (ZANAFLEX) 4 MG tablet Take 1 tablet (4 mg total) by mouth every 6 (six) hours as needed for muscle spasms. 30 tablet 3   VENTOLIN HFA 108 (90 Base) MCG/ACT inhaler Inhale 2 puffs into the lungs every 6 (six) hours as needed for wheezing or shortness of breath. 18 g 0   celecoxib (CELEBREX) 100 MG capsule Take 1 capsule (100 mg total) by mouth 2 (two) times daily. 60 capsule 2   HYDROcodone-acetaminophen (NORCO) 7.5-325 MG tablet Take 1 tablet by mouth every 6 (six) hours as needed for moderate pain. 20 tablet 0   No facility-administered medications prior to visit.    Allergies  Allergen Reactions   Cefazolin Hives   Ondansetron Hcl Hives   Sulfa Antibiotics Rash   Tramadol Hcl     Other reaction(s): chest pain   Emgality [Galcanezumab-Gnlm]    Ultram [Tramadol] Other (See Comments)    Tachycardia   Sulfasalazine Rash   ROS neg/noncontributory except as noted HPI/below      Objective:     BP 124/82   Pulse 90   Temp 98.2 F (36.8 C) (Temporal)   Ht 4\' 11"  (1.499 m)   Wt 144 lb (65.3 kg)   SpO2 98%   BMI 29.08 kg/m  Wt Readings from Last 3 Encounters:  08/26/22 144 lb (65.3 kg)  08/21/22 140 lb (63.5 kg)  08/17/22 138 lb (62.6 kg)    Physical Exam   Gen: WDWN NAD-tired HEENT: NCAT, conjunctiva not injected, sclera nonicteric CARDIAC: RRR, S1S2+, no murmur.  LUNGS: CTAB. No wheezes EXT:  no edema MSK: no gross abnormalities. MS 4+5 LLE, 5/5 RLE.  SLR neg B NEURO: A&O x3.  CN II-XII intact.  PSYCH: normal mood. Good eye contact     Assessment & Plan:   Problem List Items Addressed This Visit       Cardiovascular and Mediastinum   Essential hypertension     Nervous and Auditory   Left sided sciatica - Primary   Relevant Medications   HYDROcodone-acetaminophen (NORCO)  7.5-325 MG tablet     Other   Anxiety   HTN-chronic.  Controlled.  Cont amlodipine 10mg  and losartan 100mg  L sciatica-acute on chronic. 1st appt w/pain mgmt next wk.  Needs hydro to get thru till then.  PDMP checked.  Rx norco 7.5/325 q 6h prn.  #20.   Needs w/u and PT Anxiety-some  better on lexapro 10mg .  Cont.  Just started 30mg  cymbalta so will hold off on any increases.    Meds ordered this encounter  Medications   HYDROcodone-acetaminophen (NORCO) 7.5-325 MG tablet    Sig: Take 1 tablet by mouth every 6 (six) hours as needed for moderate pain.    Dispense:  20 tablet    Refill:  0    Wellington Hampshire, MD

## 2022-08-31 ENCOUNTER — Other Ambulatory Visit (HOSPITAL_BASED_OUTPATIENT_CLINIC_OR_DEPARTMENT_OTHER): Payer: Self-pay

## 2022-08-31 ENCOUNTER — Other Ambulatory Visit: Payer: Self-pay | Admitting: Family Medicine

## 2022-08-31 DIAGNOSIS — F902 Attention-deficit hyperactivity disorder, combined type: Secondary | ICD-10-CM

## 2022-08-31 DIAGNOSIS — Z79899 Other long term (current) drug therapy: Secondary | ICD-10-CM | POA: Diagnosis not present

## 2022-08-31 MED ORDER — AMPHETAMINE-DEXTROAMPHETAMINE 20 MG PO TABS: 20.0000 mg | ORAL_TABLET | Freq: Two times a day (BID) | ORAL | 0 refills | Status: DC

## 2022-08-31 MED ORDER — HYDROCODONE-ACETAMINOPHEN 5-325 MG PO TABS
1.0000 | ORAL_TABLET | Freq: Four times a day (QID) | ORAL | 0 refills | Status: DC | PRN
  Filled 2022-08-31: qty 20, 5d supply, fill #0

## 2022-09-01 ENCOUNTER — Other Ambulatory Visit: Payer: Self-pay

## 2022-09-01 DIAGNOSIS — F902 Attention-deficit hyperactivity disorder, combined type: Secondary | ICD-10-CM

## 2022-09-07 ENCOUNTER — Other Ambulatory Visit: Payer: Self-pay

## 2022-09-07 ENCOUNTER — Other Ambulatory Visit: Payer: Self-pay | Admitting: Family Medicine

## 2022-09-07 ENCOUNTER — Telehealth: Payer: Self-pay

## 2022-09-07 DIAGNOSIS — M5432 Sciatica, left side: Secondary | ICD-10-CM

## 2022-09-07 MED ORDER — HYDROCODONE-ACETAMINOPHEN 5-325 MG PO TABS: 1.0000 | ORAL_TABLET | Freq: Four times a day (QID) | ORAL | 0 refills | Status: DC | PRN

## 2022-09-07 NOTE — Telephone Encounter (Signed)
Pain management would like a MRI for pt per Dr. Almyra Free. Pt would like for Dr.Kulik to refill hydrocodone until pain management could get results from MRI.

## 2022-09-07 NOTE — Progress Notes (Signed)
Patient was referred to pain management at [facility].  However, records were not sent per patient.  This has been requested.  Patient states that they need an MRI.  Order placed.  Also, long-term management request was for injections, defining etiology of her chronic sciatica and worsening.  Does not want to be on long-term narcotics.  However, per patient, pain management has stated that they need records and an MRI.  Will order medications and MRI

## 2022-09-07 NOTE — Telephone Encounter (Signed)
Patient requesting refill of medication to be sent to pharmacy

## 2022-09-08 ENCOUNTER — Encounter (HOSPITAL_BASED_OUTPATIENT_CLINIC_OR_DEPARTMENT_OTHER): Payer: Self-pay | Admitting: Pharmacist

## 2022-09-08 ENCOUNTER — Other Ambulatory Visit (HOSPITAL_BASED_OUTPATIENT_CLINIC_OR_DEPARTMENT_OTHER): Payer: Self-pay

## 2022-09-15 DIAGNOSIS — R079 Chest pain, unspecified: Secondary | ICD-10-CM | POA: Diagnosis not present

## 2022-09-16 ENCOUNTER — Other Ambulatory Visit: Payer: Self-pay | Admitting: Family Medicine

## 2022-09-16 NOTE — Telephone Encounter (Signed)
Last refill: 09/07/22 #45,0  Last OV: 08/26/22 dx. Left sided sciatica

## 2022-09-17 ENCOUNTER — Other Ambulatory Visit: Payer: Self-pay | Admitting: Family Medicine

## 2022-09-17 ENCOUNTER — Other Ambulatory Visit: Payer: Self-pay | Admitting: Internal Medicine

## 2022-09-17 ENCOUNTER — Other Ambulatory Visit (HOSPITAL_BASED_OUTPATIENT_CLINIC_OR_DEPARTMENT_OTHER): Payer: Self-pay

## 2022-09-17 DIAGNOSIS — M5432 Sciatica, left side: Secondary | ICD-10-CM

## 2022-09-17 MED ORDER — GABAPENTIN 800 MG PO TABS
800.0000 mg | ORAL_TABLET | Freq: Three times a day (TID) | ORAL | 0 refills | Status: DC
Start: 2022-09-17 — End: 2022-10-21

## 2022-09-17 MED ORDER — GABAPENTIN 800 MG PO TABS
800.0000 mg | ORAL_TABLET | Freq: Three times a day (TID) | ORAL | 0 refills | Status: DC
Start: 2022-09-17 — End: 2022-09-17
  Filled 2022-09-17: qty 90, 30d supply, fill #0

## 2022-09-17 MED ORDER — HYDROCODONE-ACETAMINOPHEN 5-325 MG PO TABS: 1.0000 | ORAL_TABLET | Freq: Four times a day (QID) | ORAL | 0 refills | Status: DC | PRN

## 2022-09-17 NOTE — Progress Notes (Signed)
Patient needs gabapentin refilled.   Taking 3-4x daily vicodin.  Had CP other day so took 1.5 tabs.  Will be out of medications over weekend.  Has appointment later this month w/pain mgmt

## 2022-09-17 NOTE — Telephone Encounter (Signed)
Last refill: 09/07/22 #45, 0 Last OV: 08/26/22 dx. Left sided sciatica pain

## 2022-09-17 NOTE — Telephone Encounter (Signed)
Last refill:  Last OV:

## 2022-09-22 ENCOUNTER — Other Ambulatory Visit (HOSPITAL_BASED_OUTPATIENT_CLINIC_OR_DEPARTMENT_OTHER): Payer: Self-pay

## 2022-09-27 ENCOUNTER — Other Ambulatory Visit: Payer: Self-pay | Admitting: Family Medicine

## 2022-09-27 ENCOUNTER — Other Ambulatory Visit: Payer: Self-pay | Admitting: Nurse Practitioner

## 2022-09-27 DIAGNOSIS — F902 Attention-deficit hyperactivity disorder, combined type: Secondary | ICD-10-CM

## 2022-09-28 MED ORDER — AMPHETAMINE-DEXTROAMPHETAMINE 20 MG PO TABS
20.0000 mg | ORAL_TABLET | Freq: Two times a day (BID) | ORAL | 0 refills | Status: DC
Start: 2022-09-28 — End: 2022-10-21

## 2022-09-28 MED ORDER — HYDROCODONE-ACETAMINOPHEN 5-325 MG PO TABS: 1.0000 | ORAL_TABLET | Freq: Four times a day (QID) | ORAL | 0 refills | Status: DC | PRN

## 2022-09-28 MED ORDER — LOSARTAN POTASSIUM 100 MG PO TABS: 100.0000 mg | ORAL_TABLET | Freq: Every day | ORAL | 0 refills | Status: DC

## 2022-09-28 NOTE — Telephone Encounter (Signed)
Vicodin Last refill: 09/17/22 #45, 0 Adderall Last refill: 09/01/22 #60, 0

## 2022-10-01 ENCOUNTER — Other Ambulatory Visit: Payer: Self-pay | Admitting: Family Medicine

## 2022-10-01 ENCOUNTER — Telehealth: Payer: Self-pay

## 2022-10-01 MED ORDER — NYSTATIN 100000 UNIT/ML MT SUSP: 5.0000 mL | Freq: Four times a day (QID) | OROMUCOSAL | 0 refills | Status: DC

## 2022-10-01 NOTE — Telephone Encounter (Signed)
Patient requesting medication be sent in for thrush

## 2022-10-05 ENCOUNTER — Encounter: Payer: Self-pay | Admitting: Family Medicine

## 2022-10-05 ENCOUNTER — Ambulatory Visit (INDEPENDENT_AMBULATORY_CARE_PROVIDER_SITE_OTHER): Payer: Medicaid Other | Admitting: Family Medicine

## 2022-10-05 VITALS — BP 146/94 | HR 94 | Temp 98.7°F | Resp 18 | Ht 59.0 in | Wt 137.4 lb

## 2022-10-05 DIAGNOSIS — E538 Deficiency of other specified B group vitamins: Secondary | ICD-10-CM | POA: Diagnosis not present

## 2022-10-05 DIAGNOSIS — F322 Major depressive disorder, single episode, severe without psychotic features: Secondary | ICD-10-CM

## 2022-10-05 DIAGNOSIS — M5432 Sciatica, left side: Secondary | ICD-10-CM | POA: Diagnosis not present

## 2022-10-05 DIAGNOSIS — G43009 Migraine without aura, not intractable, without status migrainosus: Secondary | ICD-10-CM

## 2022-10-05 DIAGNOSIS — I10 Essential (primary) hypertension: Secondary | ICD-10-CM

## 2022-10-05 DIAGNOSIS — F419 Anxiety disorder, unspecified: Secondary | ICD-10-CM | POA: Diagnosis not present

## 2022-10-05 MED ORDER — BUSPIRONE HCL 5 MG PO TABS: 5.0000 mg | ORAL_TABLET | Freq: Two times a day (BID) | ORAL | 1 refills | Status: DC

## 2022-10-05 MED ORDER — CYANOCOBALAMIN 1000 MCG/ML IJ SOLN
1000.0000 ug | Freq: Once | INTRAMUSCULAR | Status: AC
  Administered 2022-10-05: 1000 ug via INTRAMUSCULAR

## 2022-10-05 MED ORDER — HYDROCODONE-ACETAMINOPHEN 10-325 MG PO TABS: 1.0000 | ORAL_TABLET | Freq: Three times a day (TID) | ORAL | 0 refills | Status: DC | PRN

## 2022-10-05 MED ORDER — METOPROLOL TARTRATE 25 MG PO TABS
25.0000 mg | ORAL_TABLET | Freq: Two times a day (BID) | ORAL | 0 refills | Status: DC
Start: 2022-10-05 — End: 2023-01-29

## 2022-10-05 MED ORDER — ESCITALOPRAM OXALATE 20 MG PO TABS: 20.0000 mg | ORAL_TABLET | Freq: Every day | ORAL | 1 refills | Status: DC

## 2022-10-05 MED ORDER — KETOROLAC TROMETHAMINE 60 MG/2ML IM SOLN
60.0000 mg | Freq: Once | INTRAMUSCULAR | Status: AC
Start: 2022-10-05 — End: 2022-10-05
  Administered 2022-10-05: 60 mg via INTRAMUSCULAR

## 2022-10-05 NOTE — Assessment & Plan Note (Signed)
Chronic.  Uncontrolled.  Partly exacerbated by current events of daughters severe motor vehicle accident.  Continue amlodipine 10 mg, losartan 100 mg.  Will add metoprolol 25 mg twice daily (hopefully this will help her anxiety a bit as well) continue to monitor blood pressures at home.  Keep [place] informed of numbers

## 2022-10-05 NOTE — Progress Notes (Signed)
Subjective:     Patient ID: Laurie Roman, female    DOB: November 30, 1975, 47 y.o.   MRN: [id number]  Chief Complaint  Patient presents with   Medical Management of Chronic Issues    Follow-up to discuss  medications, stress, discuss FMLA paperwork    HPI  HYPERTENSION-Pt is on amlodipine 10 mg, losartan 100 mg  Bp's running high-201/134 yesterday.  No dizziness/cp/palp/edema/cough/shortness of breath.  +HA  Pain way out of control.  Stressed.  Got call from MRI-will schedule.  Sleeping in recliner at hospital so pain worse.  Seeing pain management in May 24.   Taking 1-2 tabs hydro.   Daughter in severe MVA-09/23/22-will be out for at least 2-4 months. Stressed, not sleeping, anxiety is out of control.  This is causing her blood pressure to be out of control.  No SI now  There are no preventive care reminders to display for this patient.  Past Medical History:  Diagnosis Date   ADD (attention deficit disorder)    Anxiety    Asthma    Blood transfusion without reported diagnosis    Depression    GERD (gastroesophageal reflux disease)    IBS (irritable bowel syndrome)    Insomnia    Lumbago    Migraine    Opioid dependence ([facility]) 08/21/2022   Explained 08/21/22 that 4 months hydrocodone for pain management for delays in dental care has resulted in hyperalgesia and dependence   Other chest pain 08/17/2022   Normal EKG and D-dimer  Associated with lifting on father Doesn't radiate  Nonexertional  Not substernal chest pain more to the side, feels related to sleeping on it wrong (reproducible)   Sciatica    Sleep apnea    Tietze's disease     Past Surgical History:  Procedure Laterality Date   APPENDECTOMY     CESAREAN SECTION     TONSILLECTOMY     TOTAL ABDOMINAL HYSTERECTOMY W/ BILATERAL SALPINGOOPHORECTOMY     TUBAL LIGATION       Current Outpatient Medications:    albuterol (VENTOLIN HFA) 108 (90 Base) MCG/ACT inhaler, Inhale 2 puffs into the lungs every 6 (six) hours as  needed for wheezing or shortness of breath., Disp: 18 g, Rfl: 1   amLODipine (NORVASC) 10 MG tablet, Take 1 tablet (10 mg total) by mouth daily. Replaces amlodipine 5, for uncontrolled blood pressure., Disp: 90 tablet, Rfl: 3   amphetamine-dextroamphetamine (ADDERALL) 20 MG tablet, Take 1 tablet (20 mg total) by mouth 2 (two) times daily., Disp: 60 tablet, Rfl: 0   budesonide-formoterol (SYMBICORT) 160-4.5 MCG/ACT inhaler, Inhale 2 puffs into the lungs 2 (two) times daily., Disp: 1 each, Rfl: 3   busPIRone (BUSPAR) 5 MG tablet, Take 1 tablet (5 mg total) by mouth 2 (two) times daily., Disp: 60 tablet, Rfl: 1   chlorhexidine (PERIDEX) 0.12 % solution, Use as directed 15 mLs in the mouth or throat 2 (two) times daily., Disp: 1893 mL, Rfl: 0   DULoxetine (CYMBALTA) 30 MG capsule, Take one 30 mg tablet by mouth once a day for the first week. Then increase to two 30 mg tablets ( total 60mg ) by mouth once daily., Disp: 60 capsule, Rfl: 3   Erenumab-aooe (AIMOVIG) 140 MG/ML SOAJ, Inject 140 mg into the skin every 30 (thirty) days., Disp: 1 mL, Rfl: 2   fluticasone (FLONASE) 50 MCG/ACT nasal spray, Place 1 spray into both nostrils daily., Disp: 16 g, Rfl: 0   gabapentin (NEURONTIN) 800 MG tablet, Take 1  tablet (800 mg total) by mouth 3 (three) times daily. Replaces prior dosing schedule., Disp: 90 tablet, Rfl: 0   HYDROcodone-acetaminophen (NORCO) 10-325 MG tablet, Take 1 tablet by mouth every 8 (eight) hours as needed., Disp: 60 tablet, Rfl: 0   losartan (COZAAR) 100 MG tablet, Take 1 tablet (100 mg total) by mouth daily., Disp: 90 tablet, Rfl: 0   metoprolol tartrate (LOPRESSOR) 25 MG tablet, Take 1 tablet (25 mg total) by mouth 2 (two) times daily., Disp: 180 tablet, Rfl: 0   nitrofurantoin, macrocrystal-monohydrate, (MACROBID) 100 MG capsule, Take 1 capsule (100 mg total) by mouth 2 (two) times daily., Disp: 14 capsule, Rfl: 0   nystatin (MYCOSTATIN) 100000 UNIT/ML suspension, Take 5 mLs (500,000 Units  total) by mouth 4 (four) times daily. Swish and hold in mouth few seconds and swallow, Disp: 60 mL, Rfl: 0   pantoprazole (PROTONIX) 40 MG tablet, Take 1 tablet (40 mg total) by mouth daily., Disp: 90 tablet, Rfl: 0   prazosin (MINIPRESS) 1 MG capsule, Take 1 capsule (1 mg total) by mouth at bedtime., Disp: 30 capsule, Rfl: 2   rizatriptan (MAXALT) 10 MG tablet, Take 1 tablet (10 mg total) by mouth as needed for migraine. May repeat in 2 hours if needed, Disp: 10 tablet, Rfl: 0   tiZANidine (ZANAFLEX) 4 MG tablet, Take 1 tablet (4 mg total) by mouth every 6 (six) hours as needed for muscle spasms., Disp: 30 tablet, Rfl: 3   VENTOLIN HFA 108 (90 Base) MCG/ACT inhaler, Inhale 2 puffs into the lungs every 6 (six) hours as needed for wheezing or shortness of breath., Disp: 18 g, Rfl: 0   escitalopram (LEXAPRO) 20 MG tablet, Take 1 tablet (20 mg total) by mouth daily., Disp: 90 tablet, Rfl: 1   fluconazole (DIFLUCAN) 150 MG tablet, Take 1 tablet (150 mg total) by mouth every three (3) days as needed. (Patient not taking: Reported on 10/05/2022), Disp: 5 tablet, Rfl: 0   fluconazole (DIFLUCAN) 150 MG tablet, Take 1 tablet (150 mg total) by mouth every 3 (three) days as needed. (Patient not taking: Reported on 10/05/2022), Disp: 2 tablet, Rfl: 1  Allergies  Allergen Reactions   Cefazolin Hives   Ondansetron Hcl Hives   Sulfa Antibiotics Rash   Tramadol Hcl     Other reaction(s): chest pain   Emgality [Galcanezumab-Gnlm]    Ultram [Tramadol] Other (See Comments)    Tachycardia   Sulfasalazine Rash   ROS neg/noncontributory except as noted HPI/below      Objective:     BP (!) 146/94 (BP Location: Left Arm, Patient Position: Sitting, Cuff Size: Normal)   Pulse 94   Temp 98.7 F (37.1 C) (Temporal)   Resp 18   Ht 4\' 11"  (1.499 m)   Wt 137 lb 6 oz (62.3 kg)   SpO2 99%   BMI 27.75 kg/m  Wt Readings from Last 3 Encounters:  10/05/22 137 lb 6 oz (62.3 kg)  08/26/22 144 lb (65.3 kg)  08/21/22  140 lb (63.5 kg)    Physical Exam   Gen: WDWN NAD HEENT: NCAT, conjunctiva not injected, sclera nonicteric NECK:  supple, no thyromegaly, no nodes, no carotid bruits CARDIAC: RRR, S1S2+, no murmur. DP 2+B LUNGS: CTAB. No wheezes EXT:  no edema MSK: no gross abnormalities.  NEURO: A&O x3.  CN II-XII intact.  PSYCH: tearful, anxious mood. Good eye contact  45 minutes spent with patient today discussing concerns, devising plan, documentation to take leave of absence for 1 month.  Some paperwork filled out today, there will be more coming.     Assessment & Plan:  Essential hypertension Assessment & Plan: Chronic.  Uncontrolled.  Partly exacerbated by current events of daughters severe motor vehicle accident.  Continue amlodipine 10 mg, losartan 100 mg.  Will add metoprolol 25 mg twice daily (hopefully this will help her anxiety a bit as well) continue to monitor blood pressures at home.  Keep [place] informed of numbers   Migraine without aura and without status migrainosus, not intractable Assessment & Plan: Chronic.  Flaring now due to elevated blood pressures, back pain, sleeping at the hospital, worrying about her daughter.  Requesting Toradol-60 mg given IM in the office today.  Continue Aimovig per neurology  Orders: -     Ketorolac Tromethamine  Left sided sciatica Assessment & Plan: Chronic.  Out-of-control.  Partly due to sleeping in a lounge chair at the hospital.  She is seeing pain management once.  Has follow-up next month.  MRI has been ordered and she will get that scheduled.  She does have some opioid dependence.  Will increase Vicodin to 10/325 every 6 hours as needed advised not to overuse it.  Pain management will be taking over at her follow-up.  Orders: -     Ketorolac Tromethamine  Anxiety  B12 deficiency Assessment & Plan: Chronic.  Getting monthly injections.  1000 mg IM given today  Orders: -     Cyanocobalamin  Depression, major, single episode, severe  ([facility]) Assessment & Plan: Mixed with anxiety-chronic.  Uncontrolled and exacerbated by daughters critical condition.  Increase Lexapro to 20 mg daily.  Add BuSpar 5 mg twice daily.  Will take a leave of absence.  Need to get this controlled and her blood pressures.   Other orders -     Escitalopram Oxalate; Take 1 tablet (20 mg total) by mouth daily.  Dispense: 90 tablet; Refill: 1 -     Metoprolol Tartrate; Take 1 tablet (25 mg total) by mouth 2 (two) times daily.  Dispense: 180 tablet; Refill: 0 -     busPIRone HCl; Take 1 tablet (5 mg total) by mouth 2 (two) times daily.  Dispense: 60 tablet; Refill: 1 -     HYDROcodone-Acetaminophen; Take 1 tablet by mouth every 8 (eight) hours as needed.  Dispense: 60 tablet; Refill: 0  Follow up 1 mo  Angelena Sole, MD

## 2022-10-05 NOTE — Assessment & Plan Note (Signed)
Mixed with anxiety-chronic.  Uncontrolled and exacerbated by daughters critical condition.  Increase Lexapro to 20 mg daily.  Add BuSpar 5 mg twice daily.  Will take a leave of absence.  Need to get this controlled and her blood pressures.

## 2022-10-05 NOTE — Assessment & Plan Note (Signed)
Chronic.  Getting monthly injections.  1000 mg IM given today

## 2022-10-05 NOTE — Patient Instructions (Signed)
add buspar 5 mg twice daily for anxiety Start metoprolol 25 mg twice daily for blood pressure.

## 2022-10-05 NOTE — Assessment & Plan Note (Signed)
Chronic.  Flaring now due to elevated blood pressures, back pain, sleeping at the hospital, worrying about her daughter.  Requesting Toradol-60 mg given IM in the office today.  Continue Aimovig per neurology

## 2022-10-05 NOTE — Assessment & Plan Note (Signed)
Chronic.  Out-of-control.  Partly due to sleeping in a lounge chair at the hospital.  She is seeing pain management once.  Has follow-up next month.  MRI has been ordered and she will get that scheduled.  She does have some opioid dependence.  Will increase Vicodin to 10/325 every 6 hours as needed advised not to overuse it.  Pain management will be taking over at her follow-up.

## 2022-10-13 ENCOUNTER — Telehealth: Payer: Self-pay | Admitting: Family Medicine

## 2022-10-13 NOTE — Telephone Encounter (Signed)
Laurie Roman with [employer] requests Request for office notes from Oct 10, 2022 to present to be faxed to Fax# [phone] for short term disability claim

## 2022-10-13 NOTE — Telephone Encounter (Signed)
Tried returning call, mailbox full, unable to leave message.

## 2022-10-13 NOTE — Telephone Encounter (Signed)
Sent last office note, which is 10/05/22, informed on fax that there is no notes from 10/10/22 to present.

## 2022-10-21 ENCOUNTER — Other Ambulatory Visit: Payer: Self-pay | Admitting: Nurse Practitioner

## 2022-10-21 ENCOUNTER — Other Ambulatory Visit: Payer: Self-pay | Admitting: Family Medicine

## 2022-10-21 DIAGNOSIS — M5432 Sciatica, left side: Secondary | ICD-10-CM

## 2022-10-21 DIAGNOSIS — F902 Attention-deficit hyperactivity disorder, combined type: Secondary | ICD-10-CM

## 2022-10-21 MED ORDER — HYDROCODONE-ACETAMINOPHEN 10-325 MG PO TABS: 1.0000 | ORAL_TABLET | Freq: Three times a day (TID) | ORAL | 0 refills | Status: DC | PRN

## 2022-10-21 MED ORDER — GABAPENTIN 800 MG PO TABS
800.0000 mg | ORAL_TABLET | Freq: Three times a day (TID) | ORAL | 0 refills | Status: DC
Start: 2022-10-21 — End: 2022-11-10

## 2022-10-21 MED ORDER — AMPHETAMINE-DEXTROAMPHETAMINE 20 MG PO TABS
20.0000 mg | ORAL_TABLET | Freq: Two times a day (BID) | ORAL | 0 refills | Status: DC
Start: 2022-10-21 — End: 2023-01-21

## 2022-10-26 ENCOUNTER — Other Ambulatory Visit (HOSPITAL_COMMUNITY)
Admission: RE | Admit: 2022-10-26 | Discharge: 2022-10-26 | Disposition: A | Discharge status: Home / Self Care | Payer: Medicaid Other | Source: Ambulatory Visit | Attending: Internal Medicine | Admitting: Internal Medicine

## 2022-10-26 ENCOUNTER — Ambulatory Visit (INDEPENDENT_AMBULATORY_CARE_PROVIDER_SITE_OTHER): Payer: Medicaid Other | Admitting: Internal Medicine

## 2022-10-26 ENCOUNTER — Encounter: Payer: Self-pay | Admitting: Internal Medicine

## 2022-10-26 VITALS — BP 121/79 | HR 91 | Temp 98.6°F | Ht 59.0 in

## 2022-10-26 DIAGNOSIS — Z113 Encounter for screening for infections with a predominantly sexual mode of transmission: Secondary | ICD-10-CM | POA: Diagnosis not present

## 2022-10-26 DIAGNOSIS — J4521 Mild intermittent asthma with (acute) exacerbation: Secondary | ICD-10-CM | POA: Diagnosis not present

## 2022-10-26 DIAGNOSIS — N3000 Acute cystitis without hematuria: Secondary | ICD-10-CM

## 2022-10-26 DIAGNOSIS — R110 Nausea: Secondary | ICD-10-CM | POA: Diagnosis not present

## 2022-10-26 DIAGNOSIS — R300 Dysuria: Secondary | ICD-10-CM | POA: Diagnosis not present

## 2022-10-26 DIAGNOSIS — R32 Unspecified urinary incontinence: Secondary | ICD-10-CM

## 2022-10-26 DIAGNOSIS — F4329 Adjustment disorder with other symptoms: Secondary | ICD-10-CM | POA: Diagnosis not present

## 2022-10-26 DIAGNOSIS — R109 Unspecified abdominal pain: Secondary | ICD-10-CM | POA: Diagnosis not present

## 2022-10-26 DIAGNOSIS — N3946 Mixed incontinence: Secondary | ICD-10-CM

## 2022-10-26 HISTORY — DX: Unspecified urinary incontinence: R32

## 2022-10-26 LAB — POCT URINALYSIS DIPSTICK
Bilirubin, UA: NEGATIVE
Blood, UA: NEGATIVE
Glucose, UA: NEGATIVE
Ketones, UA: POSITIVE
Nitrite, UA: NEGATIVE
Protein, UA: NEGATIVE
Spec Grav, UA: >=1.03 — AB (ref 1.010–1.025)
Urobilinogen, UA: 0.2 E.U./dL
pH, UA: 5 (ref 5.0–8.0)

## 2022-10-26 MED ORDER — KETOROLAC TROMETHAMINE 10 MG PO TABS
10.0000 mg | ORAL_TABLET | Freq: Four times a day (QID) | ORAL | 0 refills | Status: DC | PRN
Start: 2022-10-26 — End: 2022-12-29

## 2022-10-26 MED ORDER — ALBUTEROL SULFATE HFA 108 (90 BASE) MCG/ACT IN AERS
2.0000 | INHALATION_SPRAY | Freq: Four times a day (QID) | RESPIRATORY_TRACT | 3 refills | Status: DC | PRN
Start: 2022-10-26 — End: 2023-02-11

## 2022-10-26 MED ORDER — OXYCODONE-ACETAMINOPHEN 5-325 MG PO TABS
1.0000 | ORAL_TABLET | ORAL | 0 refills | Status: AC | PRN
Start: 2022-10-26 — End: 2022-10-31

## 2022-10-26 MED ORDER — ONDANSETRON 4 MG PO TBDP
4.0000 mg | ORAL_TABLET | Freq: Once | ORAL | Status: DC
Start: 2022-10-26 — End: 2023-09-28

## 2022-10-26 MED ORDER — KETOROLAC TROMETHAMINE 60 MG/2ML IM SOLN
60.0000 mg | Freq: Once | INTRAMUSCULAR | Status: AC
Start: 2022-10-26 — End: 2022-10-26
  Administered 2022-10-26: 60 mg via INTRAMUSCULAR

## 2022-10-26 MED ORDER — OXYBUTYNIN CHLORIDE ER 10 MG PO TB24
10.0000 mg | ORAL_TABLET | Freq: Every day | ORAL | 1 refills | Status: DC
Start: 2022-10-26 — End: 2022-12-29

## 2022-10-26 MED ORDER — KETOROLAC TROMETHAMINE 60 MG/2ML IM SOLN
60.0000 mg | Freq: Once | INTRAMUSCULAR | Status: DC
Start: 2022-10-26 — End: 2022-10-26

## 2022-10-26 MED ORDER — ONDANSETRON HCL 4 MG PO TABS: 4.0000 mg | ORAL_TABLET | Freq: Three times a day (TID) | ORAL | 0 refills | Status: DC | PRN

## 2022-10-26 NOTE — Assessment & Plan Note (Signed)
Through a comprehensive analysis of anonymized patient data, we informed an AI-generated report. All AI suggestions underwent careful evaluation against established guidelines to ensure optimal patient care. During our collaborative review of the report's findings, we discussed potential diagnoses, recommended tests, treatment options, and next steps. I explained the rationale behind the AI recommendations, integrating my clinical judgment for the patient's understanding.  In collaboration with the patient, we explored the potential benefits and risks associated with certain categories of AI-suggested interventions. For example, we discussed the advantages and considerations of more aggressive treatment options. Ultimately, the patient elected to closely monitor the situation based on their preferences and our shared understanding of their condition. Patient's best interests and evidence-based practices guided all decisions, respecting their autonomy. Moving forward, we will continue close monitoring and revisit these options at the next visit or sooner if their condition changes. The patient was encouraged to keep me informed of any developments."  AI-Generated Report (red):    The Question  47 y.o. female at urgent care visit for urinary incontinence worsening. What medications should be prescribed? Should referral be made? Associated symptoms include flank pain, frequency, hematuria (blood with wiping and doesn't have periods.), hesitancy, nausea and urgency (sometimes can't feel the need to go). Pertinent negatives include no chills, discharge, possible pregnancy, sweats or vomiting. Urinary Frequency This is a chronic problem. The mixed incontinence started more than 1 year ago. The problem occurs every urination. The problem has been gradually worsening. The pain is at a severity of 0/10 in bladder but has right flank pain for 3-4 days. The patient is experiencing no pain elsewhere. There has been  no fever. Associated symptoms include flank pain, frequency, hematuria (blood with wiping and doesn't have periods.), hesitancy, nausea and urgency (sometimes she can't feel the need to go). Pertinent negatives include no chills, discharge, possible pregnancy, sweats or vomiting. Results for orders placed or performed in visit on 05/20/24POCT Urinalysis Dipstick Ketones, UA positive Spec Grav, UA >=1.030 (A) Leukocytes, UA Trace (A) Negative rest of ua negative.  Design Strategy  To address this question, we leverage a comprehensive understanding of urological symptoms and their potential underlying causes. The strategy involves analyzing the patient's symptoms, urinalysis results, and clinical presentation to identify the most appropriate and effective treatment and referral options. The focus is on addressing the symptoms of urinary incontinence, flank pain, and hematuria, while considering the potential for underlying conditions such as urinary tract infection, kidney stones, or more serious renal issues.  Execute Strategy  Upon analyzing the question, the patient's symptoms of worsening urinary incontinence, flank pain, and hematuria suggest a possible urinary tract issue, potentially involving the kidneys. The urinalysis results showing positive ketones, high specific gravity, and trace leukocytes support the possibility of dehydration, infection, or other renal involvement. Each potential treatment and referral option is considered:  - Prescribing medications for symptom control, such as antimuscarinics for urgency incontinence, might be considered. - Given the complexity and potential severity of the symptoms, especially the flank pain and hematuria, a referral to a specialist such as a urologist or nephrologist is warranted to further investigate and manage the underlying cause.  Systematically Ensure Accuracy & Precision  Re-evaluating the information, the presence of flank pain and  hematuria, alongside urinary incontinence, strongly suggests that this is not a straightforward case of urinary incontinence that can be managed with medications alone. The urinalysis results further support the need for specialist evaluation to rule out serious conditions such as kidney stones, infections, or other  renal pathologies.  Final Answer  Referral to a urologist or nephrologist is recommended for further diagnostic evaluation and management. This approach is necessary to address the potential underlying causes indicated by the patient's symptoms and urinalysis results. While medications may be considered to manage symptoms temporarily, the priority should be on diagnosing and treating the root cause of her symptoms.

## 2022-10-26 NOTE — Progress Notes (Signed)
[facility]: [phone]   Routine Medical Office Visit  Patient:  Laurie Roman      Age: 47 y.o.       Sex:  female  Date:   10/26/2022 PCP:    Jeani Sow, MD   Today's Healthcare Provider: [name], MD   Assessment and Plan:   Initially was concerned about urinary tract infection (UTI), but actually quite complicated due to severe flank pain ? Kidney stone with it and history bladder surgery and urinary incontinence of many years that she would like addressed as well today, that is worsened due to urinary tract infection (UTI). She also requested refills asthma medications and psychiatry referral for stress related with daughter recently almost died and she is having to provide extensive support to her and her new baby. Recent history of e.coli urinary tract infection (UTI) 07/2022.   Stress and adjustment reaction -     Ambulatory referral to Psychology  Dysuria -     POCT urinalysis dipstick -     Urine Culture -     Urine Microscopic  Screening examination for STD (sexually transmitted disease) -     Urine cytology ancillary only  Acute cystitis without hematuria -     Urine Microscopic  Abdominal pain, unspecified abdominal location -     DG Abd 1 View; Future -     CT RENAL STONE STUDY; Future -     Ketorolac Tromethamine  Mixed stress and urge urinary incontinence Assessment & Plan: Assessment: Antibiotics are not required at this time, as there is no evidence of infection from the urinalysis results.  Lower urinary tract symptoms are presumed due to uric acid crystals and chronic urinary incontinence.  Worsening urinary incontinence, flank pain, and hematuria suggest a possible urinary tract issue. Differential diagnosis includes urinary tract infection, kidney stones, or other renal pathologies.  Plan: Medications:  antimuscarinics for chronic mixed incontinence worsened by uric acid stones.  Referred care to urology given  the complexity and potential severity of symptoms (especially flank pain and hematuria), refer the patient to a specialist: Investigations: to be decided about order additional tests as needed (e.g., renal ultrasound, CT scan) based on specialist recommendations. Hydration Encourage adequate fluid intake to address dehydration. Education  about symptom management and the importance of follow-up.  Orders: -     oxyBUTYnin Chloride ER; Take 1 tablet (10 mg total) by mouth at bedtime. Replaces hydrocodone for acute flank pain breakthrough pain  Dispense: 30 tablet; Refill: 1 -     Ambulatory referral to Urology  Flank pain -     oxyCODONE-Acetaminophen; Take 1 tablet by mouth every 4 (four) hours as needed for up to 5 days for severe pain.  Dispense: 12 tablet; Refill: 0 -     Ketorolac Tromethamine; Take 1 tablet (10 mg total) by mouth every 6 (six) hours as needed.  Dispense: 20 tablet; Refill: 0  Mild intermittent asthma with acute exacerbation -     Albuterol Sulfate HFA; Inhale 2 puffs into the lungs every 6 (six) hours as needed for wheezing or shortness of breath.  Dispense: 18 g; Refill: 3  Nausea -     Ondansetron -     Ondansetron HCl; Take 1 tablet (4 mg total) by mouth every 8 (eight) hours as needed for nausea or vomiting.  Dispense: 20 tablet; Refill: 0  Treatment plan discussed and reviewed in detail. Explained medication safety and potential side effects. Agreed on patient  returning to office if symptoms worsen, persist, or new symptoms develop. Discussed precautions in case of needing to visit the Emergency Department. Answered all patient questions and confirmed understanding and comfort with the plan. Encouraged patient to contact our office if they have any questions or concerns.       Clinical Presentation:    47 y.o. female here today for Urinary Tract Infection  Urinary Tract Infection  This is a new problem. Associated symptoms include flank pain, frequency, hematuria  (blood with wiping and doesnt have periods.), hesitancy, nausea and urgency (sometimes cant feel the need to go). Pertinent negatives include no chills, discharge, possible pregnancy, sweats or vomiting.  Urinary Frequency  This is a chronic problem. The current episode started more than 1 year ago. The problem occurs every urination. The problem has been gradually worsening. The pain is at a severity of 0/10. The patient is experiencing no pain. There has been no fever. Associated symptoms include flank pain, frequency, hematuria (blood with wiping and doesnt have periods.), hesitancy, nausea and urgency (sometimes cant feel the need to go). Pertinent negatives include no chills, discharge, possible pregnancy, sweats or vomiting.    Results for orders placed or performed in visit on 10/26/22  Urine Microscopic Only  Result Value Ref Range   WBC, UA 0-2/hpf 0-2/hpf   RBC / HPF none seen 0-2/hpf   Squamous Epithelial / HPF Rare(0-4/hpf) Rare(0-4/hpf)   Uric Acid Crys, UA Presence of (A) None   Amorphous Present (A) None;Present  POCT Urinalysis Dipstick  Result Value Ref Range   Color, UA yellow    Clarity, UA clear    Glucose, UA Negative Negative   Bilirubin, UA negative    Ketones, UA positive    Spec Grav, UA >=1.030 (A) 1.010 - 1.025   Blood, UA negative    pH, UA 5.0 5.0 - 8.0   Protein, UA Negative Negative   Urobilinogen, UA 0.2 0.2 or 1.0 E.U./dL   Nitrite, UA negative    Leukocytes, UA Trace (A) Negative   Appearance     Odor    The following table summarizes the actions taken during the encounter to manage (by editing,updating, adding,and resolving)  Problem  Flank Pain   The likelihood that the flank pain is from uric acid stones is high, given the presence of uric acid crystals in the urine and the patient's symptoms  . Management should focus on pain control with NSAIDs but she reports pain is too severe even with Toradol and norco10, requests brief boost in pain control  from percocet. Plan: Provide brief course percocet. Will encouraged patient on increasing fluid intake to facilitate stone passage, and dietary modifications to reduce uric acid levels. Antibiotics are not required at this time, as there is no evidence of infection from the urinalysis results. Further imaging studies, such as a non-contrast CT scan, could be considered to assess stone size and location, guiding further management.   Urinary Incontinence   Patient reports this today for first time, but very chronic and worsening Associated symptoms include flank pain, frequency, hematuria (blood with wiping and doesn't have periods.), hesitancy, nausea and urgency (sometimes can't feel the need to go). Pertinent negatives include no chills, discharge, possible pregnancy, sweats or vomiting. Urinary Frequency This is a chronic problem. The mixed incontinence started more than 1 year ago- many years ago associated with bladder mesh. The problem occurs every urination. The problem has been gradually worsening. The pain is at a severity of 0/10  in bladder but has severe right flank pain for 3-4 days. The patient is experiencing no pain elsewhere. There has been no fever. Associated symptoms include flank pain, frequency, hematuria (blood with wiping and doesn't have periods.), hesitancy, nausea and urgency (sometimes she can't feel the need to go). Pertinent negatives include no chills, discharge, possible pregnancy, sweats or vomiting. Results for orders placed or performed in visit on 05/20/24POCT Urinalysis Dipstick Ketones, UA positive Spec Grav, UA >=1.030 (A) Leukocytes, UA Trace (A) Negative rest of ua negative.      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   Controlled substance agreement signed 10/13/2017   Gastroesophageal reflux disease without  esophagitis 05/02/2014   Left sided sciatica 09/27/2015   Asthma 03/27/2018   Maxillary sinusitis 12/02/2018   Anxiety    Primary insomnia 09/15/2019   Depression, major, single episode, severe (HCC) 05/16/2020   Cervicalgia 03/27/2022   Chronic dental pain 06/29/2022   Opioid dependence (HCC) 08/21/2022   B12 deficiency 10/05/2022   Urinary incontinence 10/26/2022   Flank pain 10/27/2022   Resolved Ambulatory Problems    Diagnosis Date Noted   Other chest pain 08/17/2022   Past Medical History:  Diagnosis Date   ADD (attention deficit disorder)    Blood transfusion without reported diagnosis    Depression    GERD (gastroesophageal reflux disease)    IBS (irritable bowel syndrome)    Insomnia    Lumbago    Sciatica    Sleep apnea    Tietze's disease     Outpatient Medications Prior to Visit  Medication Sig   albuterol (VENTOLIN HFA) 108 (90 Base) MCG/ACT inhaler Inhale 2 puffs into the lungs every 6 (six) hours as needed for wheezing or shortness of breath.   amLODipine (NORVASC) 10 MG tablet Take 1 tablet (10 mg total) by mouth daily. Replaces amlodipine 5, for uncontrolled blood pressure.   amphetamine-dextroamphetamine (ADDERALL) 20 MG tablet Take 1 tablet (20 mg total) by mouth 2 (two) times daily.   budesonide-formoterol (SYMBICORT) 160-4.5 MCG/ACT inhaler Inhale 2 puffs into the lungs 2 (two) times daily.   busPIRone (BUSPAR) 5 MG tablet Take 1 tablet (5 mg total) by mouth 2 (two) times daily.   chlorhexidine (PERIDEX) 0.12 % solution Use as directed 15 mLs in the mouth or throat 2 (two) times daily.   DULoxetine (CYMBALTA) 30 MG capsule Take one 30 mg tablet by mouth once a day for the first week. Then increase to two 30 mg tablets ( total 60mg ) by mouth once daily.   Erenumab-aooe (AIMOVIG) 140 MG/ML SOAJ Inject 140 mg into the skin every 30 (thirty) days.   escitalopram (LEXAPRO) 20 MG tablet Take 1 tablet (20 mg total) by mouth daily.   fluconazole (DIFLUCAN) 150  MG tablet Take 1 tablet (150 mg total) by mouth every three (3) days as needed.   fluconazole (DIFLUCAN) 150 MG tablet Take 1 tablet (150 mg total) by mouth every 3 (three) days as needed.   fluticasone (FLONASE) 50 MCG/ACT nasal spray Place 1 spray into both nostrils daily.   gabapentin (NEURONTIN) 800 MG tablet Take 1 tablet (800 mg total) by mouth 3 (three) times daily. Replaces prior dosing schedule.   HYDROcodone-acetaminophen (NORCO) 10-325 MG tablet Take 1 tablet by mouth every 8 (eight) hours as needed.   losartan (COZAAR) 100 MG tablet Take 1  tablet (100 mg total) by mouth daily.   metoprolol tartrate (LOPRESSOR) 25 MG tablet Take 1 tablet (25 mg total) by mouth 2 (two) times daily.   nitrofurantoin, macrocrystal-monohydrate, (MACROBID) 100 MG capsule Take 1 capsule (100 mg total) by mouth 2 (two) times daily.   nystatin (MYCOSTATIN) 100000 UNIT/ML suspension Take 5 mLs (500,000 Units total) by mouth 4 (four) times daily. Swish and hold in mouth few seconds and swallow   pantoprazole (PROTONIX) 40 MG tablet Take 1 tablet (40 mg total) by mouth daily.   prazosin (MINIPRESS) 1 MG capsule Take 1 capsule (1 mg total) by mouth at bedtime.   rizatriptan (MAXALT) 10 MG tablet Take 1 tablet (10 mg total) by mouth as needed for migraine. May repeat in 2 hours if needed   tiZANidine (ZANAFLEX) 4 MG tablet Take 1 tablet (4 mg total) by mouth every 6 (six) hours as needed for muscle spasms.   [DISCONTINUED] VENTOLIN HFA 108 (90 Base) MCG/ACT inhaler Inhale 2 puffs into the lungs every 6 (six) hours as needed for wheezing or shortness of breath.   No facility-administered medications prior to visit.         Clinical Data Analysis:   Physical Exam  BP 121/79 (BP Location: Left Arm, Patient Position: Sitting)   Pulse 91   Temp 98.6 F (37 C) (Temporal)   Ht 4\' 11"  (1.499 m)   SpO2 96%   BMI 27.75 kg/m  Wt Readings from Last 10 Encounters:  10/05/22 137 lb 6 oz (62.3 kg)  08/26/22 144 lb  (65.3 kg)  08/21/22 140 lb (63.5 kg)  08/17/22 138 lb (62.6 kg)  08/06/22 142 lb 4 oz (64.5 kg)  08/04/22 142 lb 4 oz (64.5 kg)  07/23/22 142 lb 6 oz (64.6 kg)  07/15/22 141 lb 6 oz (64.1 kg)  07/13/22 136 lb (61.7 kg)  07/07/22 134 lb 3.2 oz (60.9 kg)   Vital signs reviewed.  Nursing notes reviewed. Weight trend reviewed. Abnormalities and Problem-Specific physical exam findings:  flank tenderness, no suprapubic tenderness, distressed emotionally related with child care during the visit. General Appearance:  No acute distress appreciable.   Well-groomed, healthy-appearing female.  Well proportioned with no abnormal fat distribution.  Good muscle tone. Skin: Clear and well-hydrated. Pulmonary:  Normal work of breathing at rest, no respiratory distress apparent. SpO2: 96 %  Musculoskeletal: All extremities are intact.  Neurological:  Awake, alert, oriented, and engaged.  No obvious focal neurological deficits or cognitive impairments.  Sensorium seems unclouded.   Speech is clear and coherent with logical content. Psychiatric:  Appropriate mood, pleasant and cooperative demeanor, thoughtful and engaged during the exam  Results Reviewed:    Results for orders placed or performed in visit on 10/26/22  Urine Microscopic Only  Result Value Ref Range   WBC, UA 0-2/hpf 0-2/hpf   RBC / HPF none seen 0-2/hpf   Squamous Epithelial / HPF Rare(0-4/hpf) Rare(0-4/hpf)   Uric Acid Crys, UA Presence of (A) None   Amorphous Present (A) None;Present  POCT Urinalysis Dipstick  Result Value Ref Range   Color, UA yellow    Clarity, UA clear    Glucose, UA Negative Negative   Bilirubin, UA negative    Ketones, UA positive    Spec Grav, UA >=1.030 (A) 1.010 - 1.025   Blood, UA negative    pH, UA 5.0 5.0 - 8.0   Protein, UA Negative Negative   Urobilinogen, UA 0.2 0.2 or 1.0 E.U./dL   Nitrite, UA negative  Leukocytes, UA Trace (A) Negative   Appearance     Odor      Recent Results (from the  past 2160 hour(s))  POCT urinalysis dipstick     Status: Abnormal   Collection Time: 08/04/22 12:03 PM  Result Value Ref Range   Color, UA YELLOW    Clarity, UA     Glucose, UA Negative Negative   Bilirubin, UA NEG    Ketones, UA NEG    Spec Grav, UA 1.025 1.010 - 1.025   Blood, UA POSITIVE    pH, UA 6.0 5.0 - 8.0   Protein, UA Positive (A) Negative   Urobilinogen, UA 0.2 0.2 or 1.0 E.U./dL   Nitrite, UA NEG    Leukocytes, UA Moderate (2+) (A) Negative   Appearance     Odor    Urine Culture     Status: Abnormal   Collection Time: 08/05/22  4:12 PM   Specimen: Urine  Result Value Ref Range   MICRO NUMBER: [id number]    SPECIMEN QUALITY: Adequate    Sample Source URINE    STATUS: FINAL    ISOLATE 1: ESBL Escherichia coli (A)     Comment: Greater than 100,000 CFU/mL of Escherichia coli (ESBL) ESBL RESULT:        The organism has been confirmed as an ESBL producer.      Susceptibility   Esbl escherichia coli - URINE CULTURE, REFLEX    AMOX/CLAVULANIC >=32 Resistant     AMPICILLIN* >=32 Resistant      * Extended spectrum beta-lactamase (ESBL) producing organisms demonstrate decreased activity with penicillins, cephalosporins and aztreonam.     AMPICILLIN/SULBACTAM >=32 Resistant     CEFAZOLIN* 16 Resistant      * Extended spectrum beta-lactamase (ESBL) producing organisms demonstrate decreased activity with penicillins, cephalosporins and aztreonam. For uncomplicated UTI caused by E. coli, K. pneumoniae or P. mirabilis: Cefazolin is susceptible if MIC <32 mcg/mL and predicts susceptible to the oral agents cefaclor, cefdinir, cefpodoxime, cefprozil, cefuroxime, cephalexin and loracarbef.     CEFTAZIDIME 4 Sensitive     CEFEPIME <=1 Sensitive     CEFTRIAXONE <=1 Sensitive     CIPROFLOXACIN <=0.25 Sensitive     LEVOFLOXACIN <=0.12 Sensitive     GENTAMICIN <=1 Sensitive     IMIPENEM <=0.25 Sensitive     NITROFURANTOIN 32 Sensitive     PIP/TAZO 16 Sensitive     TOBRAMYCIN  <=1 Sensitive     TRIMETH/SULFA* <=20 Sensitive      * Extended spectrum beta-lactamase (ESBL) producing organisms demonstrate decreased activity with penicillins, cephalosporins and aztreonam. For uncomplicated UTI caused by E. coli, K. pneumoniae or P. mirabilis: Cefazolin is susceptible if MIC <32 mcg/mL and predicts susceptible to the oral agents cefaclor, cefdinir, cefpodoxime, cefprozil, cefuroxime, cephalexin and loracarbef. Legend: S = Susceptible  I = Intermediate R = Resistant  NS = Not susceptible * = Not tested  NR = Not reported **NN = See antimicrobic comments   POCT Urinalysis Dipstick     Status: Abnormal   Collection Time: 08/06/22  4:51 PM  Result Value Ref Range   Color, UA cloudy    Clarity, UA yellow    Glucose, UA Negative Negative   Bilirubin, UA Negative    Ketones, UA Negative    Spec Grav, UA 1.025 1.010 - 1.025   Blood, UA 3+    pH, UA 6.0 5.0 - 8.0   Protein, UA Positive (A) Negative   Urobilinogen, UA 0.2 0.2 or 1.0 E.U./dL  Nitrite, UA Negative    Leukocytes, UA Moderate (2+) (A) Negative   Appearance     Odor    B12     Status: None   Collection Time: 08/18/22  4:59 PM  Result Value Ref Range   Vitamin B-12 592 200 - 1,100 pg/mL  Comp Met (CMET)     Status: None   Collection Time: 08/18/22  4:59 PM  Result Value Ref Range   Glucose, Bld 84 65 - 99 mg/dL    Comment: .            Fasting reference interval .    BUN 19 7 - 25 mg/dL   Creat 6.38 7.56 - 4.33 mg/dL   BUN/Creatinine Ratio SEE NOTE: 6 - 22 (calc)    Comment:    Not Reported: BUN and Creatinine are within    reference range. .    Sodium 141 135 - 146 mmol/L   Potassium 3.7 3.5 - 5.3 mmol/L   Chloride 105 98 - 110 mmol/L   CO2 23 20 - 32 mmol/L   Calcium 9.8 8.6 - 10.2 mg/dL   Total Protein 7.7 6.1 - 8.1 g/dL   Albumin 5.0 3.6 - 5.1 g/dL   Globulin 2.7 1.9 - 3.7 g/dL (calc)   AG Ratio 1.9 1.0 - 2.5 (calc)   Total Bilirubin 0.3 0.2 - 1.2 mg/dL   Alkaline phosphatase  (APISO) 77 31 - 125 U/L   AST 17 10 - 35 U/L   ALT 11 6 - 29 U/L  CBC with Differential/Platelet     Status: Abnormal   Collection Time: 08/18/22  4:59 PM  Result Value Ref Range   WBC 9.3 3.8 - 10.8 Thousand/uL   RBC 4.01 3.80 - 5.10 Million/uL   Hemoglobin 12.2 11.7 - 15.5 g/dL   HCT 29.5 18.8 - 41.6 %   MCV 88.3 80.0 - 100.0 fL   MCH 30.4 27.0 - 33.0 pg   MCHC 34.5 32.0 - 36.0 g/dL   RDW 60.6 30.1 - 60.1 %   Platelets 314 140 - 400 Thousand/uL   MPV 11.3 7.5 - 12.5 fL   Neutro Abs 4,371 1,500 - 7,800 cells/uL   Lymphs Abs 3,906 (H) 850 - 3,900 cells/uL   Absolute Monocytes 688 200 - 950 cells/uL   Eosinophils Absolute 233 15 - 500 cells/uL   Basophils Absolute 102 0 - 200 cells/uL   Neutrophils Relative % 47 %   Total Lymphocyte 42.0 %   Monocytes Relative 7.4 %   Eosinophils Relative 2.5 %   Basophils Relative 1.1 %  D-Dimer, Quantitative     Status: None   Collection Time: 08/18/22  4:59 PM  Result Value Ref Range   D-Dimer, Quant 0.22 <0.50 mcg/mL FEU    Comment: . The D-Dimer test is used frequently to exclude an acute PE or DVT. In patients with a low to moderate clinical risk assessment and a D-Dimer result <0.50 mcg/mL FEU, the likelihood of a PE or DVT is very low. However, a thromboembolic event should not be excluded solely on the basis of the D-Dimer level. Increased levels of D-Dimer are associated with a PE, DVT, DIC, malignancies, inflammation, sepsis, surgery, trauma, pregnancy, and advancing patient age. [Jama 2006 11:295(2):199-207] . For additional information, please refer to: http://education.[facility].com/faq/FAQ149 (This link is being provided for informational/ educational purposes only) .   POCT Urinalysis Dipstick     Status: Abnormal   Collection Time: 10/26/22  3:57 PM  Result Value Ref  Range   Color, UA yellow    Clarity, UA clear    Glucose, UA Negative Negative   Bilirubin, UA negative    Ketones, UA positive    Spec  Grav, UA >=1.030 (A) 1.010 - 1.025   Blood, UA negative    pH, UA 5.0 5.0 - 8.0   Protein, UA Negative Negative   Urobilinogen, UA 0.2 0.2 or 1.0 E.U./dL   Nitrite, UA negative    Leukocytes, UA Trace (A) Negative   Appearance     Odor    Urine Microscopic Only     Status: Abnormal   Collection Time: 10/26/22  4:52 PM  Result Value Ref Range   WBC, UA 0-2/hpf 0-2/hpf   RBC / HPF none seen 0-2/hpf   Squamous Epithelial / HPF Rare(0-4/hpf) Rare(0-4/hpf)   Uric Acid Crys, UA Presence of (A) None   Amorphous Present (A) None;Present    No image results found.   No results found.     Signed: [name], MD 10/27/2022 8:34 PM

## 2022-10-27 ENCOUNTER — Encounter: Payer: Self-pay | Admitting: Internal Medicine

## 2022-10-27 DIAGNOSIS — R109 Unspecified abdominal pain: Secondary | ICD-10-CM | POA: Insufficient documentation

## 2022-10-27 LAB — URINE CULTURE
MICRO NUMBER:: 14978560
Result:: NO GROWTH
SPECIMEN QUALITY:: ADEQUATE

## 2022-10-27 LAB — URINALYSIS, MICROSCOPIC ONLY: RBC / HPF: NONE SEEN (ref 0–?)

## 2022-10-27 NOTE — Patient Instructions (Signed)
It was a pleasure seeing you today! Your health and satisfaction are our top priorities.   Glenetta Hew, MD  Next Steps:  [x]  Early Intervention: Schedule sooner appointment, call our on-call services, or go to emergency room if there is Increase in pain or discomfort New or worsening symptoms Sudden or severe changes in your health [x]  Flexible Follow-Up: We recommend a No follow-ups on file. for optimal routine care. This allows for progress monitoring and treatment adjustments. [x]  Preventive Care: Schedule your annual preventive care visit! It's typically covered by insurance and helps identify potential health issues early. [x]  Lab & X-ray Appointments: Incomplete tests scheduled today, or call to schedule. X-rays: [facility] (M-F, 8:30am-noon or 1pm-5pm). [x]  Medical Information Release: Sign a release form at front desk to obtain relevant medical information we don't have.  Making the Most of Our Focused (20 minute) Appointments:  [x]   Clearly state your top concerns at the beginning of the visit to focus our discussion [x]   If you anticipate you will need more time, please inform the front desk during scheduling - we can book multiple appointments in the same week. [x]   If you have transportation problems- use our convenient video appointments or ask about transportation support. [x]   We can get down to business faster if you use MyChart to update information before the visit and submit non-urgent questions before your visit. Thank you for taking the time to provide details through MyChart.  Let our nurse know and she can import this information into your encounter documents.  Arrival and Wait Times: [x]   Arriving on time ensures that everyone receives prompt attention. [x]   Early morning (8a) and afternoon (1p) appointments tend to have shortest wait times. [x]   Unfortunately, we cannot delay appointments for late arrivals or hold slots during phone  calls.  Getting Answers and Following Up  [x]   Simple Questions & Concerns: For quick questions or basic follow-up after your visit, reach [place] at [phone] or MyChart messaging. [x]   Complex Concerns: If your concern is more complex, scheduling an appointment might be best. Discuss this with the staff to find the most suitable option. [x]   Lab & Imaging Results: We'll contact you directly if results are abnormal or you don't use MyChart. Most normal results will be on MyChart within 2-3 business days, with a review message from Dr. Jon Billings. Haven't heard back in 2 weeks? Need results sooner? Contact us at [phone]. [x]   Referrals: Our referral coordinator will manage specialist referrals. The specialist's office should contact you within 2 weeks to schedule an appointment. Call us if you haven't heard from them after 2 weeks.  Staying Connected  [x]   MyChart: Activate your MyChart for the fastest way to access results and message [place]. See the last page of this paperwork for instructions on how to activate.  Bring to Your Next Appointment  [x]   Medications: Please bring all your medication bottles to your next appointment to ensure we have an accurate record of your prescriptions. [x]   Health Diaries: If you're monitoring any health conditions at home, keeping a diary of your readings can be very helpful for discussions at your next appointment.  Billing  [x]   X-ray & Lab Orders: These are billed by separate companies. Contact the invoicing company directly for questions or concerns. [x]   Visit Charges: Discuss any billing inquiries with our administrative services team.  Your Satisfaction Matters  [x]   Share Your Experience: We strive for your satisfaction!  If you have any complaints, or preferably compliments, please let Dr. Jon Billings know directly or contact our Practice Administrators, Edwena Felty or [employer], by asking at the front desk.   Reviewing Your Records  [x]    Review this early draft of your clinical encounter notes below and the final encounter summary tomorrow on MyChart after its been completed.   Flank pain -     oxyCODONE-Acetaminophen; Take 1 tablet by mouth every 4 (four) hours as needed for up to 5 days for severe pain.  Dispense: 12 tablet; Refill: 0 -     Ketorolac Tromethamine; Take 1 tablet (10 mg total) by mouth every 6 (six) hours as needed.  Dispense: 20 tablet; Refill: 0  Dysuria -     POCT urinalysis dipstick -     Urine Culture -     Urine Microscopic  Screening examination for STD (sexually transmitted disease) -     Urine cytology ancillary only  Acute cystitis without hematuria -     Urine Microscopic  Abdominal pain, unspecified abdominal location -     DG Abd 1 View; Future -     CT RENAL STONE STUDY; Future -     Ketorolac Tromethamine  Mixed stress and urge urinary incontinence Assessment & Plan: Assessment: Antibiotics are not required at this time, as there is no evidence of infection from the urinalysis results.  Lower urinary tract symptoms are presumed due to uric acid crystals and chronic urinary incontinence.  Worsening urinary incontinence, flank pain, and hematuria suggest a possible urinary tract issue. Differential diagnosis includes urinary tract infection, kidney stones, or other renal pathologies.  Plan: Medications:  antimuscarinics for chronic mixed incontinence worsened by uric acid stones.  Referred care to urology given the complexity and potential severity of symptoms (especially flank pain and hematuria), refer the patient to a specialist: Investigations: to be decided about order additional tests as needed (e.g., renal ultrasound, CT scan) based on specialist recommendations. Hydration Encourage adequate fluid intake to address dehydration. Education  about symptom management and the importance of follow-up.  Orders: -     oxyBUTYnin Chloride ER; Take 1 tablet (10 mg total) by mouth at  bedtime. Replaces hydrocodone for acute flank pain breakthrough pain  Dispense: 30 tablet; Refill: 1 -     Ambulatory referral to Urology  Stress and adjustment reaction -     Ambulatory referral to Psychology  Mild intermittent asthma with acute exacerbation -     Albuterol Sulfate HFA; Inhale 2 puffs into the lungs every 6 (six) hours as needed for wheezing or shortness of breath.  Dispense: 18 g; Refill: 3  Nausea -     Ondansetron -     Ondansetron HCl; Take 1 tablet (4 mg total) by mouth every 8 (eight) hours as needed for nausea or vomiting.  Dispense: 20 tablet; Refill: 0

## 2022-10-28 ENCOUNTER — Encounter: Payer: Self-pay | Admitting: Internal Medicine

## 2022-10-28 DIAGNOSIS — R82998 Other abnormal findings in urine: Secondary | ICD-10-CM | POA: Insufficient documentation

## 2022-10-28 LAB — URINE CYTOLOGY ANCILLARY ONLY
Bacterial Vaginitis-Urine: NEGATIVE
Candida Urine: NEGATIVE
Chlamydia: NEGATIVE
Comment: NEGATIVE
Comment: NEGATIVE
Comment: NORMAL
Neisseria Gonorrhea: NEGATIVE
Trichomonas: NEGATIVE

## 2022-11-02 ENCOUNTER — Other Ambulatory Visit (HOSPITAL_COMMUNITY): Payer: Self-pay

## 2022-11-02 ENCOUNTER — Telehealth: Payer: Self-pay

## 2022-11-02 NOTE — Telephone Encounter (Signed)
Patient Advocate Encounter   Received notification from [facility] that prior authorization is required for Aimovig 140MG /ML auto-injectors   Submitted: 11-02-2022 Key [id number]  Status is pending

## 2022-11-03 ENCOUNTER — Ambulatory Visit: Payer: Medicaid Other | Admitting: Family Medicine

## 2022-11-04 ENCOUNTER — Ambulatory Visit (INDEPENDENT_AMBULATORY_CARE_PROVIDER_SITE_OTHER): Payer: Medicaid Other | Admitting: Family Medicine

## 2022-11-04 ENCOUNTER — Encounter: Payer: Self-pay | Admitting: *Deleted

## 2022-11-04 ENCOUNTER — Ambulatory Visit (INDEPENDENT_AMBULATORY_CARE_PROVIDER_SITE_OTHER)
Admission: RE | Admit: 2022-11-04 | Discharge: 2022-11-04 | Disposition: A | Discharge status: Home / Self Care | Payer: Medicaid Other | Source: Ambulatory Visit | Attending: Family Medicine | Admitting: Family Medicine

## 2022-11-04 ENCOUNTER — Encounter: Payer: Self-pay | Admitting: Family Medicine

## 2022-11-04 VITALS — BP 136/88 | HR 62 | Temp 98.0°F | Resp 16 | Ht 59.0 in | Wt 140.1 lb

## 2022-11-04 DIAGNOSIS — F902 Attention-deficit hyperactivity disorder, combined type: Secondary | ICD-10-CM | POA: Diagnosis not present

## 2022-11-04 DIAGNOSIS — S0992XA Unspecified injury of nose, initial encounter: Secondary | ICD-10-CM | POA: Diagnosis not present

## 2022-11-04 DIAGNOSIS — F419 Anxiety disorder, unspecified: Secondary | ICD-10-CM | POA: Diagnosis not present

## 2022-11-04 DIAGNOSIS — M5432 Sciatica, left side: Secondary | ICD-10-CM | POA: Diagnosis not present

## 2022-11-04 DIAGNOSIS — I10 Essential (primary) hypertension: Secondary | ICD-10-CM | POA: Diagnosis not present

## 2022-11-04 DIAGNOSIS — G43009 Migraine without aura, not intractable, without status migrainosus: Secondary | ICD-10-CM | POA: Diagnosis not present

## 2022-11-04 DIAGNOSIS — J3489 Other specified disorders of nose and nasal sinuses: Secondary | ICD-10-CM | POA: Diagnosis not present

## 2022-11-04 MED ORDER — BUSPIRONE HCL 10 MG PO TABS: 10.0000 mg | ORAL_TABLET | Freq: Two times a day (BID) | ORAL | 1 refills | Status: DC

## 2022-11-04 NOTE — Assessment & Plan Note (Signed)
Chronic.  Out of control.  A lot of stressors in life.  Continue Lexapro 20 mg daily.  Increase buspar to 10 mg twice daily.

## 2022-11-04 NOTE — Assessment & Plan Note (Signed)
Chronic.  Controlled today, but frequently elevated-not sure if from other medications, withdrawals from medications, anxiety, other.  Consider pheo as hot/red/flushed/blood pressure high.   Will check 24 hour urine.  Continue amlodipine 10 mg, cozaar 100 mg and metoprolol 25 mg twice daily

## 2022-11-04 NOTE — Assessment & Plan Note (Signed)
Chronic.  On gabapentin 800mg  three times daily, zanaflex 4 mg at night-time, NSAIDs, and narcotics.  Has been referred to pain management but need MRI-will re-authorize.  Advised to cut back on narcotics (opioids).

## 2022-11-04 NOTE — Assessment & Plan Note (Signed)
Chronic  controlled mostly w/Aimovig monthly.  Continue.

## 2022-11-04 NOTE — Assessment & Plan Note (Signed)
Chronic.  Controlled on Adderall 20 mg twice daily-not taking regularly-more when working or needs to focus at home. PDMP checked

## 2022-11-04 NOTE — Progress Notes (Signed)
Subjective:     Patient ID: Laurie Roman, female    DOB: 1975/11/16, 47 y.o.   MRN: [id number]  Chief Complaint  Patient presents with   Follow-up    1 month follow-up Discuss extension for two more weeks for FMLA and Short term disability     HPI  HTN-Pt is on amlodipine 10 mg, cozaar 100 mg and metoprolol 25 mg twice daily.  Bp's running  all over  No /cp/palp/edema/cough/shortness of breath.   Blood pressure got high and "passed out" and hit nose on counter.  Had headache(s) and some diziness that day as well.  Ears got hot and face red.  Happened week prior and was very "hot".  Spacey. Needs extension on disability.  ADHD-on aderall 20 mg twice daily but has backed off as well. Doesn't seem to be the problem w/blood pressure's.   Anxiety-on Lexapro 20 mg, buspar 5 mg twice daily-not holding.  Still a lot of stress/anxiety. Trying to get back to work, however, sick daughter, other family issues making worse.  No SI. Chronic sciatica-needs to get re-authorize for MRI as daughter in MVA and wasn't able to get done.  Migraine doing well on aimovig, but 2-3 days prior to dose, breaks through.  Uses maxalt.  Not often     There are no preventive care reminders to display for this patient.  Past Medical History:  Diagnosis Date   ADD (attention deficit disorder)    Anxiety    Asthma    Blood transfusion without reported diagnosis    Depression    GERD (gastroesophageal reflux disease)    IBS (irritable bowel syndrome)    Insomnia    Lumbago    Migraine    Opioid dependence (HCC) 08/21/2022   Explained 08/21/22 that 4 months hydrocodone for pain management for delays in dental care has resulted in hyperalgesia and dependence   Other chest pain 08/17/2022   Normal EKG and D-dimer  Associated with lifting on father Doesn't radiate  Nonexertional  Not substernal chest pain more to the side, feels related to sleeping on it wrong (reproducible)   Sciatica    Sleep apnea    Tietze's  disease    Urinary incontinence 10/26/2022    Past Surgical History:  Procedure Laterality Date   APPENDECTOMY     CESAREAN SECTION     TONSILLECTOMY     TOTAL ABDOMINAL HYSTERECTOMY W/ BILATERAL SALPINGOOPHORECTOMY     TUBAL LIGATION       Current Outpatient Medications:    albuterol (VENTOLIN HFA) 108 (90 Base) MCG/ACT inhaler, Inhale 2 puffs into the lungs every 6 (six) hours as needed for wheezing or shortness of breath., Disp: 18 g, Rfl: 1   albuterol (VENTOLIN HFA) 108 (90 Base) MCG/ACT inhaler, Inhale 2 puffs into the lungs every 6 (six) hours as needed for wheezing or shortness of breath., Disp: 18 g, Rfl: 3   amLODipine (NORVASC) 10 MG tablet, Take 1 tablet (10 mg total) by mouth daily. Replaces amlodipine 5, for uncontrolled blood pressure., Disp: 90 tablet, Rfl: 3   amphetamine-dextroamphetamine (ADDERALL) 20 MG tablet, Take 1 tablet (20 mg total) by mouth 2 (two) times daily., Disp: 60 tablet, Rfl: 0   budesonide-formoterol (SYMBICORT) 160-4.5 MCG/ACT inhaler, Inhale 2 puffs into the lungs 2 (two) times daily., Disp: 1 each, Rfl: 3   chlorhexidine (PERIDEX) 0.12 % solution, Use as directed 15 mLs in the mouth or throat 2 (two) times daily., Disp: 1893 mL, Rfl: 0  Erenumab-aooe (AIMOVIG) 140 MG/ML SOAJ, Inject 140 mg into the skin every 30 (thirty) days., Disp: 1 mL, Rfl: 2   escitalopram (LEXAPRO) 20 MG tablet, Take 1 tablet (20 mg total) by mouth daily., Disp: 90 tablet, Rfl: 1   fluticasone (FLONASE) 50 MCG/ACT nasal spray, Place 1 spray into both nostrils daily., Disp: 16 g, Rfl: 0   gabapentin (NEURONTIN) 800 MG tablet, Take 1 tablet (800 mg total) by mouth 3 (three) times daily. Replaces prior dosing schedule., Disp: 90 tablet, Rfl: 0   HYDROcodone-acetaminophen (NORCO) 10-325 MG tablet, Take 1 tablet by mouth every 8 (eight) hours as needed., Disp: 60 tablet, Rfl: 0   ketorolac (TORADOL) 10 MG tablet, Take 1 tablet (10 mg total) by mouth every 6 (six) hours as needed.,  Disp: 20 tablet, Rfl: 0   losartan (COZAAR) 100 MG tablet, Take 1 tablet (100 mg total) by mouth daily., Disp: 90 tablet, Rfl: 0   metoprolol tartrate (LOPRESSOR) 25 MG tablet, Take 1 tablet (25 mg total) by mouth 2 (two) times daily., Disp: 180 tablet, Rfl: 0   nystatin (MYCOSTATIN) 100000 UNIT/ML suspension, Take 5 mLs (500,000 Units total) by mouth 4 (four) times daily. Swish and hold in mouth few seconds and swallow, Disp: 60 mL, Rfl: 0   ondansetron (ZOFRAN) 4 MG tablet, Take 1 tablet (4 mg total) by mouth every 8 (eight) hours as needed for nausea or vomiting., Disp: 20 tablet, Rfl: 0   oxybutynin (DITROPAN XL) 10 MG 24 hr tablet, Take 1 tablet (10 mg total) by mouth at bedtime. Replaces hydrocodone for acute flank pain breakthrough pain, Disp: 30 tablet, Rfl: 1   pantoprazole (PROTONIX) 40 MG tablet, Take 1 tablet (40 mg total) by mouth daily., Disp: 90 tablet, Rfl: 0   prazosin (MINIPRESS) 1 MG capsule, Take 1 capsule (1 mg total) by mouth at bedtime., Disp: 30 capsule, Rfl: 2   rizatriptan (MAXALT) 10 MG tablet, Take 1 tablet (10 mg total) by mouth as needed for migraine. May repeat in 2 hours if needed, Disp: 10 tablet, Rfl: 0   tiZANidine (ZANAFLEX) 4 MG tablet, Take 1 tablet (4 mg total) by mouth every 6 (six) hours as needed for muscle spasms., Disp: 30 tablet, Rfl: 3   busPIRone (BUSPAR) 10 MG tablet, Take 1 tablet (10 mg total) by mouth 2 (two) times daily., Disp: 60 tablet, Rfl: 1   nitrofurantoin, macrocrystal-monohydrate, (MACROBID) 100 MG capsule, Take 1 capsule (100 mg total) by mouth 2 (two) times daily. (Patient not taking: Reported on 11/04/2022), Disp: 14 capsule, Rfl: 0  Current Facility-Administered Medications:    ondansetron (ZOFRAN-ODT) disintegrating tablet 4 mg, 4 mg, Oral, Once, Laurie Olszewski, MD  Allergies  Allergen Reactions   Cefazolin Hives   Ondansetron Hcl Hives   Sulfa Antibiotics Rash   Tramadol Hcl     Other reaction(s): chest pain   Emgality  [Galcanezumab-Gnlm]    Ultram [Tramadol] Other (See Comments)    Tachycardia   Sulfasalazine Rash   ROS neg/noncontributory except as noted HPI/below      Objective:     BP 136/88   Pulse 62   Temp 98 F (36.7 C) (Temporal)   Resp 16   Ht 4\' 11"  (1.499 m)   Wt 140 lb 2 oz (63.6 kg)   SpO2 96%   BMI 28.30 kg/m  Wt Readings from Last 3 Encounters:  11/04/22 140 lb 2 oz (63.6 kg)  10/05/22 137 lb 6 oz (62.3 kg)  08/26/22 144 lb (  65.3 kg)    Physical Exam   Gen: WDWN NAD HEENT: NCAT, conjunctiva not injected, sclera nonicteric NECK:  supple, no thyromegaly, no nodes, no carotid bruits Ployps in right nare.  Blue turbinates.  Bump bridge of nose and bruise and tender  CARDIAC: RRR, S1S2+, no murmur. DP 2+B LUNGS: CTAB. No wheezes ABDOMEN:  BS+, soft, NTND, No HSM, no masses EXT:  no edema MSK: no gross abnormalities.  NEURO: A&O x3.  CN II-XII intact.  PSYCH: normal mood. Good eye contact     Assessment & Plan:  Essential hypertension Assessment & Plan: Chronic.  Controlled today, but frequently elevated-not sure if from other medications, withdrawals from medications, anxiety, other.  Consider pheo as hot/red/flushed/blood pressure high.   Will check 24 hour urine.  Continue amlodipine 10 mg, cozaar 100 mg and metoprolol 25 mg twice daily  Orders: -     Catecholamines, fractionated, urine, 24 hour; Future -     Metanephrines, urine, 24 hour; Future  Migraine without aura and without status migrainosus, not intractable Assessment & Plan: Chronic  controlled mostly w/Aimovig monthly.  Continue.     Attention deficit hyperactivity disorder (ADHD), combined type Assessment & Plan: Chronic.  Controlled on Adderall 20 mg twice daily-not taking regularly-more when working or needs to focus at home. PDMP checked   Anxiety Assessment & Plan: Chronic.  Out of control.  A lot of stressors in life.  Continue Lexapro 20 mg daily.  Increase buspar to 10 mg twice daily.      Blunt trauma of nose, initial encounter -     DG Nasal Bones; Future -     Ambulatory referral to ENT  Left sided sciatica Assessment & Plan: Chronic.  On gabapentin 800mg  three times daily, zanaflex 4 mg at night-time, NSAIDs, and narcotics.  Has been referred to pain management but need MRI-will re-authorize.  Advised to cut back on narcotics (opioids).    Other orders -     busPIRone HCl; Take 1 tablet (10 mg total) by mouth 2 (two) times daily.  Dispense: 60 tablet; Refill: 1  Extend LOA for 2 more weeks to get workup on hypertension, anxiety medications adjustment.    Follow up 1 mo  Angelena Sole, MD

## 2022-11-04 NOTE — Patient Instructions (Addendum)
It was very nice to see you today!  Increase buspar to 10 mg twice/day   [facility] Address: [address] [place] [address] Phone: [phone]     PLEASE NOTE:  If you had any lab tests please let us know if you have not heard back within a few days. You may see your results on MyChart before we have a chance to review them but we will give you a call once they are reviewed by [place]. If we ordered any referrals today, please let us know if you have not heard from their office within the next week.   Please try these tips to maintain a healthy lifestyle:  Eat most of your calories during the day when you are active. Eliminate processed foods including packaged sweets (pies, cakes, cookies), reduce intake of potatoes, white bread, white pasta, and white rice. Look for whole grain options, oat flour or almond flour.  Each meal should contain half fruits/vegetables, one quarter protein, and one quarter carbs (no bigger than a computer mouse).  Cut down on sweet beverages. This includes juice, soda, and sweet tea. Also watch fruit intake, though this is a healthier sweet option, it still contains natural sugar! Limit to 3 servings daily.  Drink at least 1 glass of water with each meal and aim for at least 8 glasses per day  Exercise at least 150 minutes every week.

## 2022-11-09 ENCOUNTER — Other Ambulatory Visit: Payer: Self-pay | Admitting: Family Medicine

## 2022-11-09 NOTE — Telephone Encounter (Signed)
Last refill: 10/21/22 #60, 0 Last OV: 11/04/22 dx. HTN, migraine, ADHD, left sided sciatic pain

## 2022-11-10 ENCOUNTER — Other Ambulatory Visit: Payer: Self-pay | Admitting: Family Medicine

## 2022-11-10 ENCOUNTER — Other Ambulatory Visit: Payer: Self-pay | Admitting: *Deleted

## 2022-11-10 DIAGNOSIS — M5432 Sciatica, left side: Secondary | ICD-10-CM

## 2022-11-10 MED ORDER — HYDROCODONE-ACETAMINOPHEN 10-325 MG PO TABS: 1.0000 | ORAL_TABLET | Freq: Three times a day (TID) | ORAL | 0 refills | Status: DC | PRN

## 2022-11-10 MED ORDER — NYSTATIN 100000 UNIT/GM EX CREA: 1.0000 | TOPICAL_CREAM | Freq: Two times a day (BID) | CUTANEOUS | 0 refills | Status: DC

## 2022-11-11 ENCOUNTER — Telehealth (INDEPENDENT_AMBULATORY_CARE_PROVIDER_SITE_OTHER): Payer: Medicaid Other | Admitting: Family Medicine

## 2022-11-11 ENCOUNTER — Encounter: Payer: Self-pay | Admitting: Family Medicine

## 2022-11-11 ENCOUNTER — Other Ambulatory Visit (HOSPITAL_COMMUNITY)
Admission: RE | Admit: 2022-11-11 | Discharge: 2022-11-11 | Disposition: A | Discharge status: Home / Self Care | Payer: Medicaid Other | Source: Ambulatory Visit | Attending: Family Medicine | Admitting: Family Medicine

## 2022-11-11 DIAGNOSIS — N760 Acute vaginitis: Secondary | ICD-10-CM | POA: Diagnosis not present

## 2022-11-11 DIAGNOSIS — M5432 Sciatica, left side: Secondary | ICD-10-CM | POA: Diagnosis not present

## 2022-11-11 MED ORDER — METRONIDAZOLE 500 MG PO TABS: 500.0000 mg | ORAL_TABLET | Freq: Two times a day (BID) | ORAL | 0 refills | Status: AC

## 2022-11-11 MED ORDER — FLUCONAZOLE 150 MG PO TABS: 150.0000 mg | ORAL_TABLET | ORAL | 0 refills | Status: DC | PRN

## 2022-11-11 NOTE — Progress Notes (Signed)
MyChart Video Visit Virtual Visit via Video Note   This visit type was conducted w/patient consent. This format is felt to be most appropriate for this patient at this time. Physical exam was limited by quality of the video and audio technology used for the visit. CMA was able to get the patient set up on a video visit.  Patient location: Home. Patient and provider in visit Provider location: Office  I discussed the limitations of evaluation and management by telemedicine and the availability of in person appointments. The patient expressed understanding and agreed to proceed.  Visit Date: 11/11/2022  Today's healthcare provider: Angelena Sole, MD     Subjective:    Patient ID: Laurie Roman, female    DOB: [birth year], 47 y.o.   MRN: [id number]  Chief Complaint  Patient presents with   Discuss paperwork    Discuss FMLA paperwork   Vaginitis    Has yeast     HPI  Vaginitis-had bacterial vaginosis not long ago.  Symptoms back. +odor-fishy.  No itch or D/C. Had intercourse recently.  Family Medical Leave Act [facility]) paperwork-till end of month.  Needs note  Pain-can't see pain management till has MRI. Hasn't heard about authorize for MRI.  Doesn't want to take suboxone for pain . Will have pain management do once MRI completed.    Past Medical History:  Diagnosis Date   ADD (attention deficit disorder)    Anxiety    Asthma    Blood transfusion without reported diagnosis    Depression    GERD (gastroesophageal reflux disease)    IBS (irritable bowel syndrome)    Insomnia    Lumbago    Migraine    Opioid dependence (HCC) 08/21/2022   Explained 08/21/22 that 4 months hydrocodone for pain management for delays in dental care has resulted in hyperalgesia and dependence   Other chest pain 08/17/2022   Normal EKG and D-dimer  Associated with lifting on father Doesn't radiate  Nonexertional  Not substernal chest pain more to the side, feels related to sleeping on it wrong  (reproducible)   Sciatica    Sleep apnea    Tietze's disease    Urinary incontinence 10/26/2022    Past Surgical History:  Procedure Laterality Date   APPENDECTOMY     CESAREAN SECTION     TONSILLECTOMY     TOTAL ABDOMINAL HYSTERECTOMY W/ BILATERAL SALPINGOOPHORECTOMY     TUBAL LIGATION      Outpatient Medications Prior to Visit  Medication Sig Dispense Refill   albuterol (VENTOLIN HFA) 108 (90 Base) MCG/ACT inhaler Inhale 2 puffs into the lungs every 6 (six) hours as needed for wheezing or shortness of breath. 18 g 3   amLODipine (NORVASC) 10 MG tablet Take 1 tablet (10 mg total) by mouth daily. Replaces amlodipine 5, for uncontrolled blood pressure. 90 tablet 3   amphetamine-dextroamphetamine (ADDERALL) 20 MG tablet Take 1 tablet (20 mg total) by mouth 2 (two) times daily. 60 tablet 0   budesonide-formoterol (SYMBICORT) 160-4.5 MCG/ACT inhaler Inhale 2 puffs into the lungs 2 (two) times daily. 1 each 3   busPIRone (BUSPAR) 10 MG tablet Take 1 tablet (10 mg total) by mouth 2 (two) times daily. 60 tablet 1   Erenumab-aooe (AIMOVIG) 140 MG/ML SOAJ Inject 140 mg into the skin every 30 (thirty) days. 1 mL 2   escitalopram (LEXAPRO) 20 MG tablet Take 1 tablet (20 mg total) by mouth daily. 90 tablet 1   fluticasone (FLONASE) 50 MCG/ACT nasal spray  Place 1 spray into both nostrils daily. 16 g 0   gabapentin (NEURONTIN) 800 MG tablet Take 1 tablet (800 mg total) by mouth 3 (three) times daily. Replaces prior dosing schedule. 90 tablet 0   HYDROcodone-acetaminophen (NORCO) 10-325 MG tablet Take 1 tablet by mouth every 8 (eight) hours as needed. 60 tablet 0   ketorolac (TORADOL) 10 MG tablet Take 1 tablet (10 mg total) by mouth every 6 (six) hours as needed. 20 tablet 0   losartan (COZAAR) 100 MG tablet Take 1 tablet (100 mg total) by mouth daily. 90 tablet 0   metoprolol tartrate (LOPRESSOR) 25 MG tablet Take 1 tablet (25 mg total) by mouth 2 (two) times daily. 180 tablet 0   nitrofurantoin,  macrocrystal-monohydrate, (MACROBID) 100 MG capsule Take 1 capsule (100 mg total) by mouth 2 (two) times daily. 14 capsule 0   nystatin (MYCOSTATIN) 100000 UNIT/ML suspension Take 5 mLs (500,000 Units total) by mouth 4 (four) times daily. Swish and hold in mouth few seconds and swallow 60 mL 0   nystatin cream (MYCOSTATIN) Apply 1 Application topically 2 (two) times daily. 30 g 0   ondansetron (ZOFRAN) 4 MG tablet Take 1 tablet (4 mg total) by mouth every 8 (eight) hours as needed for nausea or vomiting. 20 tablet 0   oxybutynin (DITROPAN XL) 10 MG 24 hr tablet Take 1 tablet (10 mg total) by mouth at bedtime. Replaces hydrocodone for acute flank pain breakthrough pain 30 tablet 1   pantoprazole (PROTONIX) 40 MG tablet Take 1 tablet (40 mg total) by mouth daily. 90 tablet 0   prazosin (MINIPRESS) 1 MG capsule Take 1 capsule (1 mg total) by mouth at bedtime. 30 capsule 2   rizatriptan (MAXALT) 10 MG tablet Take 1 tablet (10 mg total) by mouth as needed for migraine. May repeat in 2 hours if needed 10 tablet 0   tiZANidine (ZANAFLEX) 4 MG tablet Take 1 tablet (4 mg total) by mouth every 6 (six) hours as needed for muscle spasms. 30 tablet 3   chlorhexidine (PERIDEX) 0.12 % solution Use as directed 15 mLs in the mouth or throat 2 (two) times daily. (Patient not taking: Reported on 11/11/2022) 1893 mL 0   albuterol (VENTOLIN HFA) 108 (90 Base) MCG/ACT inhaler Inhale 2 puffs into the lungs every 6 (six) hours as needed for wheezing or shortness of breath. 18 g 1   Facility-Administered Medications Prior to Visit  Medication Dose Route Frequency Provider Last Rate Last Admin   ondansetron (ZOFRAN-ODT) disintegrating tablet 4 mg  4 mg Oral Once [name], MD        Allergies  Allergen Reactions   Cefazolin Hives   Ondansetron Hcl Hives   Sulfa Antibiotics Rash   Tramadol Hcl     Other reaction(s): chest pain   Emgality [Galcanezumab-Gnlm]    Ultram [Tramadol] Other (See Comments)     Tachycardia   Sulfasalazine Rash        Objective:     Physical Exam  Vitals and nursing note reviewed.  Constitutional:      General:  is not in acute distress.    Appearance: Normal appearance.  HENT:     Head: Normocephalic.  Pulmonary:     Effort: No respiratory distress.  Skin:    General: Skin is dry.     Coloration: Skin is not pale.  Neurological:     Mental Status: Pt is alert and oriented to person, place, and time.  Psychiatric:  Mood and Affect: Mood normal.   There were no vitals taken for this visit.  Wt Readings from Last 3 Encounters:  11/04/22 140 lb 2 oz (63.6 kg)  10/05/22 137 lb 6 oz (62.3 kg)  08/26/22 144 lb (65.3 kg)       Assessment & Plan:   Problem List Items Addressed This Visit       Nervous and Auditory   Left sided sciatica   Other Visit Diagnoses     Acute vaginitis    -  Primary   Relevant Orders   Cervicovaginal ancillary only     1.  Vaginitis-has had BV and yeast recently.  She does have yeast under her breasts.  Will check Aptima swab.  Prescribe Flagyl 500 mg twice daily x 7 days and fluconazole 150 mg x 1. 2.  Left-sided sciatica-chronic.  Worsened.  Patient has already been on NSAIDs, several rounds of steroids, muscle relaxers.  She has been taking a lot of narcotics to control the pain.  We have some concerns about dependence/possible addiction.  She is trying to work with pain management, however needs to get the MRI of her back done before they will write her prescriptions.  We will check on the status of her MRI.  Advised to stay on top of this as I do not want to lose anymore time and need to get a definitive plan for pain control in place.  Declines Suboxone.  Follow-up 1 month  Meds ordered this encounter  Medications   metroNIDAZOLE (FLAGYL) 500 MG tablet    Sig: Take 1 tablet (500 mg total) by mouth 2 (two) times daily for 7 days.    Dispense:  14 tablet    Refill:  0   fluconazole (DIFLUCAN) 150 MG  tablet    Sig: Take 1 tablet (150 mg total) by mouth every three (3) days as needed.    Dispense:  2 tablet    Refill:  0    I discussed the assessment and treatment plan with the patient. The patient was provided an opportunity to ask questions and all were answered. The patient agreed with the plan and demonstrated an understanding of the instructions.   The patient was advised to call back or seek an in-person evaluation if the symptoms worsen or if the condition fails to improve as anticipated.  Return in about 3 weeks (around 12/02/2022) for HTN, mood.  Angelena Sole, MD [facility] [place] [phone] (phone) [phone] (fax)  [facility]

## 2022-11-11 NOTE — Patient Instructions (Addendum)
It was very nice to see you today!  Sent metronidazole and diflucan.   Sent to [facility] Address: [address] [place] [address] Phone: [phone]   PLEASE NOTE:  If you had any lab tests please let us know if you have not heard back within a few days. You may see your results on MyChart before we have a chance to review them but we will give you a call once they are reviewed by [place]. If we ordered any referrals today, please let us know if you have not heard from their office within the next week.   Please try these tips to maintain a healthy lifestyle:  Eat most of your calories during the day when you are active. Eliminate processed foods including packaged sweets (pies, cakes, cookies), reduce intake of potatoes, white bread, white pasta, and white rice. Look for whole grain options, oat flour or almond flour.  Each meal should contain half fruits/vegetables, one quarter protein, and one quarter carbs (no bigger than a computer mouse).  Cut down on sweet beverages. This includes juice, soda, and sweet tea. Also watch fruit intake, though this is a healthier sweet option, it still contains natural sugar! Limit to 3 servings daily.  Drink at least 1 glass of water with each meal and aim for at least 8 glasses per day  Exercise at least 150 minutes every week.

## 2022-11-13 LAB — CERVICOVAGINAL ANCILLARY ONLY
Bacterial Vaginitis (gardnerella): POSITIVE — AB
Candida Glabrata: NEGATIVE
Candida Vaginitis: NEGATIVE
Chlamydia: NEGATIVE
Comment: NEGATIVE
Comment: NEGATIVE
Comment: NEGATIVE
Comment: NEGATIVE
Comment: NEGATIVE
Comment: NORMAL
Neisseria Gonorrhea: NEGATIVE
Trichomonas: NEGATIVE

## 2022-11-16 NOTE — Telephone Encounter (Signed)
Noted.  Patient notified.

## 2022-11-16 NOTE — Telephone Encounter (Signed)
Patient Advocate Encounter  Prior Authorization for Aimovig 140MG /ML auto-injectors has been approved through [employer].    KeyHoy Finlay  Effective: 11-02-2022 to 11-02-2023

## 2022-11-26 ENCOUNTER — Other Ambulatory Visit: Payer: Self-pay | Admitting: Family Medicine

## 2022-11-26 ENCOUNTER — Encounter: Payer: Self-pay | Admitting: Family Medicine

## 2022-11-26 NOTE — Telephone Encounter (Signed)
Noted  

## 2022-11-27 ENCOUNTER — Other Ambulatory Visit: Payer: Self-pay | Admitting: Family Medicine

## 2022-11-27 DIAGNOSIS — F902 Attention-deficit hyperactivity disorder, combined type: Secondary | ICD-10-CM

## 2022-11-27 MED ORDER — HYDROCODONE-ACETAMINOPHEN 10-325 MG PO TABS: 1.0000 | ORAL_TABLET | Freq: Three times a day (TID) | ORAL | 0 refills | Status: DC | PRN

## 2022-11-30 ENCOUNTER — Other Ambulatory Visit (HOSPITAL_COMMUNITY): Payer: Self-pay

## 2022-11-30 ENCOUNTER — Telehealth: Payer: Self-pay | Admitting: Pharmacy Technician

## 2022-11-30 NOTE — Telephone Encounter (Signed)
Pharmacy Patient Advocate Encounter   Received notification from CoverMyMeds that prior authorization for HYDROcodone-Acetaminophen 10-325MG  tablets is required/requested.    PA submitted to [facility] via CoverMyMeds Key/confirmation #/EOC [id number] Status is pending

## 2022-12-01 NOTE — Telephone Encounter (Signed)
PA has been APPROVED from 11/30/2022-06/01/2023 

## 2022-12-14 ENCOUNTER — Ambulatory Visit (INDEPENDENT_AMBULATORY_CARE_PROVIDER_SITE_OTHER): Payer: Medicaid Other | Admitting: Family Medicine

## 2022-12-14 ENCOUNTER — Encounter: Payer: Self-pay | Admitting: Family Medicine

## 2022-12-14 VITALS — BP 130/70 | HR 95 | Temp 98.3°F | Resp 18 | Ht 59.0 in | Wt 143.1 lb

## 2022-12-14 DIAGNOSIS — G43909 Migraine, unspecified, not intractable, without status migrainosus: Secondary | ICD-10-CM

## 2022-12-14 DIAGNOSIS — M5431 Sciatica, right side: Secondary | ICD-10-CM | POA: Diagnosis not present

## 2022-12-14 DIAGNOSIS — I10 Essential (primary) hypertension: Secondary | ICD-10-CM

## 2022-12-14 DIAGNOSIS — K219 Gastro-esophageal reflux disease without esophagitis: Secondary | ICD-10-CM | POA: Diagnosis not present

## 2022-12-14 MED ORDER — PANTOPRAZOLE SODIUM 40 MG PO TBEC: 40.0000 mg | DELAYED_RELEASE_TABLET | Freq: Every day | ORAL | 0 refills | Status: DC

## 2022-12-14 MED ORDER — RIZATRIPTAN BENZOATE 10 MG PO TABS
10.0000 mg | ORAL_TABLET | ORAL | 3 refills | Status: DC | PRN
Start: 2022-12-14 — End: 2023-05-18

## 2022-12-14 MED ORDER — KETOROLAC TROMETHAMINE 60 MG/2ML IM SOLN
60.00 mg | Freq: Once | INTRAMUSCULAR | Status: AC
Start: 2022-12-14 — End: 2022-12-14
  Administered 2022-12-14: 60 mg via INTRAMUSCULAR

## 2022-12-14 MED ORDER — TIZANIDINE HCL 4 MG PO TABS
4.0000 mg | ORAL_TABLET | Freq: Four times a day (QID) | ORAL | 3 refills | Status: DC | PRN
Start: 2022-12-14 — End: 2023-05-11

## 2022-12-14 MED ORDER — HYDROCODONE-ACETAMINOPHEN 10-325 MG PO TABS
1.0000 | ORAL_TABLET | Freq: Three times a day (TID) | ORAL | 0 refills | Status: DC | PRN
Start: 2022-12-14 — End: 2022-12-31

## 2022-12-14 MED ORDER — PREDNISONE 20 MG PO TABS: 40.0000 mg | ORAL_TABLET | Freq: Every day | ORAL | 0 refills | Status: AC

## 2022-12-14 MED ORDER — LOSARTAN POTASSIUM 100 MG PO TABS
100.0000 mg | ORAL_TABLET | Freq: Every day | ORAL | 1 refills | Status: DC
Start: 2022-12-14 — End: 2023-05-18

## 2022-12-14 NOTE — Assessment & Plan Note (Signed)
Chronic.  Well-controlled on Aimovig.  Rarely uses XL 10 mill med grams.  This was renewed

## 2022-12-14 NOTE — Patient Instructions (Signed)

## 2022-12-14 NOTE — Progress Notes (Signed)
Subjective:     Patient ID: Laurie Roman, female    DOB: 1976-03-14, 47 y.o.   MRN: [id number]  Chief Complaint  Patient presents with   Hip Pain    Right hip pain that started Thursday, off and on    HPI Hip pain - She complains of intermittent right hip pain starting Thursday. Reports pain radiates down her right leg, with occasional numbness and tingling. She states she has been slanting more frequently as back to work and thinks that has flared her chronic pain. Has not felt her leg collapsing when standing/walking. Is scheduled for an MRI on 7/30. She has been taking hydrocodone and only takes ibuprofen once day. She notes she got a massage this weekend, which did not help.  Hypertension - Her blood pressure is well controlled during this visit, at 130/70 pt on losartan 100mg , amlodipine 10mg ,metoprolol 25mg  bid.  Hasn't done 24 hr urine yet.  Bp's can be up and down depending on stress leve.  .  Sleep - She has been able to sleep well when taking tizanidine 4 mg. Prazosin not work. Migraine-doing great on aimovig.  Rare breakthru-maxalt works then.  GERD-well-controlled.  No dysphagia.  No bleeding.  There are no preventive care reminders to display for this patient.  Past Medical History:  Diagnosis Date   ADD (attention deficit disorder)    Anxiety    Asthma    Blood transfusion without reported diagnosis    Depression    GERD (gastroesophageal reflux disease)    IBS (irritable bowel syndrome)    Insomnia    Lumbago    Migraine    Opioid dependence (HCC) 08/21/2022   Explained 08/21/22 that 4 months hydrocodone for pain management for delays in dental care has resulted in hyperalgesia and dependence   Other chest pain 08/17/2022   Normal EKG and D-dimer  Associated with lifting on father Doesn't radiate  Nonexertional  Not substernal chest pain more to the side, feels related to sleeping on it wrong (reproducible)   Sciatica    Sleep apnea    Tietze's disease     Urinary incontinence 10/26/2022    Past Surgical History:  Procedure Laterality Date   APPENDECTOMY     CESAREAN SECTION     TONSILLECTOMY     TOTAL ABDOMINAL HYSTERECTOMY W/ BILATERAL SALPINGOOPHORECTOMY     TUBAL LIGATION       Current Outpatient Medications:    albuterol (VENTOLIN HFA) 108 (90 Base) MCG/ACT inhaler, Inhale 2 puffs into the lungs every 6 (six) hours as needed for wheezing or shortness of breath., Disp: 18 g, Rfl: 3   amLODipine (NORVASC) 10 MG tablet, Take 1 tablet (10 mg total) by mouth daily. Replaces amlodipine 5, for uncontrolled blood pressure., Disp: 90 tablet, Rfl: 3   budesonide-formoterol (SYMBICORT) 160-4.5 MCG/ACT inhaler, Inhale 2 puffs into the lungs 2 (two) times daily., Disp: 1 each, Rfl: 3   busPIRone (BUSPAR) 10 MG tablet, Take 1 tablet (10 mg total) by mouth 2 (two) times daily., Disp: 60 tablet, Rfl: 1   chlorhexidine (PERIDEX) 0.12 % solution, Use as directed 15 mLs in the mouth or throat 2 (two) times daily., Disp: 1893 mL, Rfl: 0   Erenumab-aooe (AIMOVIG) 140 MG/ML SOAJ, Inject 140 mg into the skin every 30 (thirty) days., Disp: 1 mL, Rfl: 2   escitalopram (LEXAPRO) 20 MG tablet, Take 1 tablet (20 mg total) by mouth daily., Disp: 90 tablet, Rfl: 1   fluticasone (FLONASE) 50 MCG/ACT  nasal spray, Place 1 spray into both nostrils daily., Disp: 16 g, Rfl: 0   gabapentin (NEURONTIN) 800 MG tablet, Take 1 tablet (800 mg total) by mouth 3 (three) times daily. Replaces prior dosing schedule., Disp: 90 tablet, Rfl: 0   ketorolac (TORADOL) 10 MG tablet, Take 1 tablet (10 mg total) by mouth every 6 (six) hours as needed., Disp: 20 tablet, Rfl: 0   metoprolol tartrate (LOPRESSOR) 25 MG tablet, Take 1 tablet (25 mg total) by mouth 2 (two) times daily., Disp: 180 tablet, Rfl: 0   nystatin (MYCOSTATIN) 100000 UNIT/ML suspension, Take 5 mLs (500,000 Units total) by mouth 4 (four) times daily. Swish and hold in mouth few seconds and swallow, Disp: 60 mL, Rfl: 0    nystatin cream (MYCOSTATIN), Apply 1 Application topically 2 (two) times daily., Disp: 30 g, Rfl: 0   ondansetron (ZOFRAN) 4 MG tablet, Take 1 tablet (4 mg total) by mouth every 8 (eight) hours as needed for nausea or vomiting., Disp: 20 tablet, Rfl: 0   oxybutynin (DITROPAN XL) 10 MG 24 hr tablet, Take 1 tablet (10 mg total) by mouth at bedtime. Replaces hydrocodone for acute flank pain breakthrough pain, Disp: 30 tablet, Rfl: 1   predniSONE (DELTASONE) 20 MG tablet, Take 2 tablets (40 mg total) by mouth daily with breakfast for 5 days., Disp: 10 tablet, Rfl: 0   amphetamine-dextroamphetamine (ADDERALL) 20 MG tablet, Take 1 tablet (20 mg total) by mouth 2 (two) times daily., Disp: 60 tablet, Rfl: 0   fluconazole (DIFLUCAN) 150 MG tablet, Take 1 tablet (150 mg total) by mouth every three (3) days as needed. (Patient not taking: Reported on 12/14/2022), Disp: 2 tablet, Rfl: 0   HYDROcodone-acetaminophen (NORCO) 10-325 MG tablet, Take 1 tablet by mouth every 8 (eight) hours as needed., Disp: 60 tablet, Rfl: 0   losartan (COZAAR) 100 MG tablet, Take 1 tablet (100 mg total) by mouth daily., Disp: 90 tablet, Rfl: 1   pantoprazole (PROTONIX) 40 MG tablet, Take 1 tablet (40 mg total) by mouth daily., Disp: 90 tablet, Rfl: 0   rizatriptan (MAXALT) 10 MG tablet, Take 1 tablet (10 mg total) by mouth as needed for migraine. May repeat in 2 hours if needed, Disp: 10 tablet, Rfl: 3   tiZANidine (ZANAFLEX) 4 MG tablet, Take 1 tablet (4 mg total) by mouth every 6 (six) hours as needed for muscle spasms., Disp: 30 tablet, Rfl: 3  Current Facility-Administered Medications:    ondansetron (ZOFRAN-ODT) disintegrating tablet 4 mg, 4 mg, Oral, Once, Laurie Olszewski, MD  Allergies  Allergen Reactions   Cefazolin Hives   Ondansetron Hcl Hives   Sulfa Antibiotics Rash   Tramadol Hcl     Other reaction(s): chest pain   Emgality [Galcanezumab-Gnlm]    Ultram [Tramadol] Other (See Comments)    Tachycardia    Sulfasalazine Rash   ROS neg/noncontributory except as noted HPI/below      Objective:     BP 130/70   Pulse 95   Temp 98.3 F (36.8 C) (Temporal)   Resp 18   Ht 4\' 11"  (1.499 m)   Wt 143 lb 2 oz (64.9 kg)   SpO2 99%   BMI 28.91 kg/m  Wt Readings from Last 3 Encounters:  12/14/22 143 lb 2 oz (64.9 kg)  11/04/22 140 lb 2 oz (63.6 kg)  10/05/22 137 lb 6 oz (62.3 kg)    Physical Exam   Gen: WDWN NAD MSK: no gross abnormalities.  NEURO: A&O x3.  CN  II-XII intact.  PSYCH: normal mood. Good eye contact Back:  Can stand on heels/toes/1 leg.  +TTP on R SI. No rash.  MS 5/5 BLE.  DTR 2+ BLE.  +SLR R>L.  Good ROM     Assessment & Plan:  Right sided sciatica Assessment & Plan: Acute flare on chronic.  Probably from returning to work and being on her feet more.  Requesting Toradol 60 mg IM-given today.  Prednisone 40 mg daily x 4.  Renewed hydrocodone 10/325.  PDMP was checked.  She does have an MRI at the end of the month.  She will be then going to pain management.  Orders: -     Ketorolac Tromethamine -     tiZANidine HCl; Take 1 tablet (4 mg total) by mouth every 6 (six) hours as needed for muscle spasms.  Dispense: 30 tablet; Refill: 3 -     HYDROcodone-Acetaminophen; Take 1 tablet by mouth every 8 (eight) hours as needed.  Dispense: 60 tablet; Refill: 0  Essential hypertension Assessment & Plan: Chronic.  Very labile.  Currently controlled today.  Has not done her 24-hour urine for p.o.  Advised to do.  Continue metoprolol 25 mg twice daily, losartan 100 mg daily, amlodipine 10 mg daily  Orders: -     Losartan Potassium; Take 1 tablet (100 mg total) by mouth daily.  Dispense: 90 tablet; Refill: 1  Migraine without status migrainosus, not intractable, unspecified migraine type Assessment & Plan: Chronic.  Well-controlled on Aimovig.  Rarely uses XL 10 mill med grams.  This was renewed  Orders: -     Rizatriptan Benzoate; Take 1 tablet (10 mg total) by mouth as needed  for migraine. May repeat in 2 hours if needed  Dispense: 10 tablet; Refill: 3  Gastroesophageal reflux disease without esophagitis Assessment & Plan: Chronic.  Well-controlled.  Continue Protonix 40 mg daily  Orders: -     Pantoprazole Sodium; Take 1 tablet (40 mg total) by mouth daily.  Dispense: 90 tablet; Refill: 0  Other orders -     predniSONE; Take 2 tablets (40 mg total) by mouth daily with breakfast for 5 days.  Dispense: 10 tablet; Refill: 0    Return in about 3 months (around 03/16/2023) for HTN, mood.   I,Rachel Rivera,acting as a scribe for Angelena Sole, MD.,have documented all relevant documentation on the behalf of Angelena Sole, MD,as directed by  Angelena Sole, MD while in the presence of Angelena Sole, MD.  I, Angelena Sole, MD, have reviewed all documentation for this visit. The documentation on 12/14/22 for the exam, diagnosis, procedures, and orders are all accurate and complete.   Angelena Sole, MD

## 2022-12-14 NOTE — Assessment & Plan Note (Signed)
Chronic.  Very labile.  Currently controlled today.  Has not done her 24-hour urine for p.o.  Advised to do.  Continue metoprolol 25 mg twice daily, losartan 100 mg daily, amlodipine 10 mg daily

## 2022-12-14 NOTE — Assessment & Plan Note (Signed)
Acute flare on chronic.  Probably from returning to work and being on her feet more.  Requesting Toradol 60 mg IM-given today.  Prednisone 40 mg daily x 4.  Renewed hydrocodone 10/325.  PDMP was checked.  She does have an MRI at the end of the month.  She will be then going to pain management.

## 2022-12-14 NOTE — Assessment & Plan Note (Signed)
Chronic.  Well-controlled.  Continue Protonix 40 mg daily

## 2022-12-16 ENCOUNTER — Other Ambulatory Visit: Payer: Self-pay | Admitting: Family Medicine

## 2022-12-16 DIAGNOSIS — M5432 Sciatica, left side: Secondary | ICD-10-CM

## 2022-12-29 ENCOUNTER — Encounter: Payer: Self-pay | Admitting: Internal Medicine

## 2022-12-29 ENCOUNTER — Ambulatory Visit (INDEPENDENT_AMBULATORY_CARE_PROVIDER_SITE_OTHER): Payer: Medicaid Other | Admitting: Internal Medicine

## 2022-12-29 ENCOUNTER — Other Ambulatory Visit (HOSPITAL_BASED_OUTPATIENT_CLINIC_OR_DEPARTMENT_OTHER): Payer: Self-pay

## 2022-12-29 VITALS — BP 165/104 | HR 112 | Temp 99.0°F | Ht 59.0 in | Wt 146.6 lb

## 2022-12-29 DIAGNOSIS — N3941 Urge incontinence: Secondary | ICD-10-CM

## 2022-12-29 DIAGNOSIS — M5431 Sciatica, right side: Secondary | ICD-10-CM | POA: Diagnosis not present

## 2022-12-29 DIAGNOSIS — M25551 Pain in right hip: Secondary | ICD-10-CM

## 2022-12-29 DIAGNOSIS — R109 Unspecified abdominal pain: Secondary | ICD-10-CM | POA: Diagnosis not present

## 2022-12-29 LAB — POCT URINALYSIS DIPSTICK
Bilirubin, UA: NEGATIVE
Blood, UA: NEGATIVE
Glucose, UA: NEGATIVE
Ketones, UA: NEGATIVE
Leukocytes, UA: NEGATIVE
Nitrite, UA: NEGATIVE
Protein, UA: NEGATIVE
Spec Grav, UA: 1.02 (ref 1.010–1.025)
Urobilinogen, UA: 0.2 E.U./dL
pH, UA: 6 (ref 5.0–8.0)

## 2022-12-29 MED ORDER — KETOROLAC TROMETHAMINE 10 MG PO TABS
10.0000 mg | ORAL_TABLET | Freq: Four times a day (QID) | ORAL | 0 refills | Status: DC | PRN
Start: 2022-12-29 — End: 2023-01-27
  Filled 2022-12-29: qty 20, 5d supply, fill #0

## 2022-12-29 MED ORDER — METHYLPREDNISOLONE ACETATE 80 MG/ML IJ SUSP
80.0000 mg | Freq: Once | INTRAMUSCULAR | Status: AC
Start: 2022-12-29 — End: 2022-12-29
  Administered 2022-12-29: 80 mg via INTRAMUSCULAR

## 2022-12-29 MED ORDER — PREDNISONE 20 MG PO TABS
ORAL_TABLET | ORAL | 0 refills | Status: DC
Start: 2022-12-29 — End: 2023-01-25
  Filled 2022-12-29: qty 10, 7d supply, fill #0

## 2022-12-29 MED ORDER — KETOROLAC TROMETHAMINE 60 MG/2ML IM SOLN
60.0000 mg | Freq: Once | INTRAMUSCULAR | Status: AC
Start: 2022-12-29 — End: 2022-12-29
  Administered 2022-12-29: 60 mg via INTRAMUSCULAR

## 2022-12-29 MED ORDER — OXYCODONE-ACETAMINOPHEN 10-325 MG PO TABS
1.0000 | ORAL_TABLET | ORAL | 0 refills | Status: DC | PRN
Start: 2022-12-29 — End: 2022-12-31
  Filled 2022-12-29: qty 10, 3d supply, fill #0

## 2022-12-29 NOTE — Patient Instructions (Addendum)
It was a pleasure seeing you today! Your health and satisfaction are our top priorities.  Laurie Hew, MD  Your Providers PCP: Jeani Sow, MD,  [phone]) Referring Provider: Jeani Sow, MD,  [phone])  VISIT SUMMARY:  During your visit, we discussed your ongoing right hip pain and neck discomfort, which has been worsening over the past three months. You reported that your current medications are not providing sufficient relief. We also discussed your recent increase in urinary incontinence and a low-grade fever. Your blood pressure was found to be elevated, likely due to pain. We have outlined a plan to address these issues and improve your overall health.  YOUR PLAN:  -SCIATICA: Sciatica is a condition where pain radiates along the path of the sciatic nerve, which branches from your lower back through your hips and buttocks and down each leg. We already have a pending MRI but recommend you go to ER to get it sooner to rule out serious conditions like cauda equina syndrome or spinal infection. For immediate pain relief, we will administer Toradol and methylprednisolone injections today, followed by Toradol tablets for 5 days and prednisone tablets for 7 days. Oxycodone tablets are prescribed for 1-2 days. A urinalysis was negative today, so your urinary symptoms are likely from spinal compression. You are advised to avoid lifting and bending until we have the results from the MRI.  -HYPERTENSION: Hypertension, or high blood pressure, can lead to serious health problems if not managed. Your blood pressure was elevated during the visit, likely due to pain. We plan to treat the pain to help lower your blood pressure and may consider increasing your antihypertensive medication if your blood pressure remains elevated after pain management.    -CHRONIC PAIN MANAGEMENT: Chronic pain is pain that lasts longer than six months. Your current regimen of hydrocodone and gabapentin is not  providing adequate relief for your sciatica. We will consider transitioning to a Butrans patch or Suboxone for longer-lasting pain relief once the acute issue is resolved.  Alternatively pain management may benefit.  However, currently, from a medical standpoint, we need emergent imaging to better understand the source of your pain and what is causing it and other problems.    NEXT STEPS: [x]  Early Intervention: Schedule sooner appointment, call our on-call services, or go to emergency room if there is any significant Increase in pain or discomfort New or worsening symptoms Sudden or severe changes in your health [x]  Flexible Follow-Up: We recommend a Return in about 1 week (around 01/05/2023) for close follow up acute illness. for optimal routine care (come sooner if emergency room or hospital recommends). This allows for progress monitoring and treatment adjustments.  MAKING THE MOST OF OUR FOCUSED 20 MINUTE APPOINTMENTS: [x]   Clearly state your top concerns at the beginning of the visit to focus our discussion [x]   If you anticipate you will need more time, please inform the front desk during scheduling - we can book multiple appointments in the same week. [x]   If you have transportation problems- use our convenient video appointments or ask about transportation support. [x]   We can get down to business faster if you use MyChart to update information before the visit and submit non-urgent questions before your visit. Thank you for taking the time to provide details through MyChart.  Let our nurse know and she can import this information into your encounter documents.  Arrival and Wait Times: [x]   Arriving on time ensures that everyone receives prompt attention. [x]   Early  morning (8a) and afternoon (1p) appointments tend to have shortest wait times. [x]   Unfortunately, we cannot delay appointments for late arrivals or hold slots during phone calls.  Getting Answers and Following Up [x]    Simple Questions & Concerns: For quick questions or basic follow-up after your visit, reach [place] at [phone] or MyChart messaging. [x]   Complex Concerns: If your concern is more complex, scheduling an appointment might be best. Discuss this with the staff to find the most suitable option. [x]   Lab & Imaging Results: We'll contact you directly if results are abnormal or you don't use MyChart. Most normal results will be on MyChart within 2-3 business days, with a review message from Dr. Jon Billings. Haven't heard back in 2 weeks? Need results sooner? Contact us at [phone]. [x]   Referrals: Our referral coordinator will manage specialist referrals. The specialist's office should contact you within 2 weeks to schedule an appointment. Call us if you haven't heard from them after 2 weeks.  Staying Connected [x]   MyChart: Activate your MyChart for the fastest way to access results and message [place]. See the last page of this paperwork for instructions on how to activate.  Bring to Your Next Appointment [x]   Medications: Please bring all your medication bottles to your next appointment to ensure we have an accurate record of your prescriptions. [x]   Health Diaries: If you're monitoring any health conditions at home, keeping a diary of your readings can be very helpful for discussions at your next appointment.  Billing [x]   X-ray & Lab Orders: These are billed by separate companies. Contact the invoicing company directly for questions or concerns. [x]   Visit Charges: Discuss any billing inquiries with our administrative services team.  Your Satisfaction Matters [x]   Share Your Experience: We strive for your satisfaction! If you have any complaints, or preferably compliments, please let Dr. Jon Billings know directly or contact our Practice Administrators, Edwena Felty or [employer], by asking at the front desk.   Reviewing Your Records [x]   Review this early draft of your clinical encounter notes  below and the final encounter summary tomorrow on MyChart after its been completed.  All orders placed so far are visible here: Right sided sciatica Assessment & Plan: Sciatica: Right-sided sciatica has been worsening over the past three months, with pain radiating down to the foot. Despite management with hydrocodone and gabapentin, she is not achieving adequate relief. She also reports urinary incontinence, which, along with fever, raises concerns for possible cauda equina syndrome or spinal infection. She agreed to go to ER for stat MRI to rule out these conditions, as an outpatient MRI is already ordered and held up for logistics reasons. For immediate pain relief, Toradol and methylprednisolone injections will be administered today, followed by Toradol tablets for 5 days and prednisone tablets for 7 days. Oxycodone tablets are prescribed for 1-2 days to manage acute pain until she goes to emergency room (she plans to hold off up to a day) A urinalysis is ordered today because she wonders if she has urinary tract infection (UTI) due to urinary incontinence worsening-but it was completely clear, making the progressive urinary incontinence even more concerning for cauda equina.  She is advised to avoid lifting and bending until the spine is cleared from MRI findings. Considering the severity, an ER visit for further management and possible surgical intervention is strongly recommended.  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  I have considered and concluded the patient has a very low likelihood of having one of the following serious causes of back pain: bone tumor (no tumors/masses), acute bone fracture (no severe trauma or bone loss), or pyelonephritis (no pain radiating to groin, costovertebral angle tenderness).    However, there is a small chance of aortic aneurysm (very  high blood pressure, long term smoking, but she has no ripping/tearing sensations), vertebral disk infection (fevers mild but no scrapes or IVDU), and subacute spinal compression (mild weakness/numbness, worsening bladder function and urinary incontinence).  She was advised to go to ER immediately due to theese symptom(s) and stated she intends to go but needs to make sure her daughter has someone to care for her first.   Standard treatment offered & documented here: For acute pain, rest, intermittent application of cold packs (later, may switch to heat, but do not sleep on heating pad), analgesics and muscle relaxants are recommended.  For more chronic pain, take as needed NSAID's (with plenty of fluids)  Proper lifting with avoidance of heavy lifting discussed.  Provided instructions in AVS for how to do stretches and discussed using it for a home back care exercise program with flexion exercise routine.  Work excuse provided and she will call or return to clinic prn if these symptoms worsen or fail to improve as anticipated.  She will see me or her Primary Care Provider (PCP) in follow up as soon as possible after emergency room evaluation for back pain with multiple red flags.  She understands the seriousness of her condition as it was explained in detail.   Chronic Pain Management: Her current regimen of hydrocodone and gabapentin is not providing adequate relief for sciatica chronically, and she is also taking tizanidine 4mg  at night. We will consider transitioning to a Butrans patch or Suboxone for longer-lasting pain relief once the acute issue is resolved.    Hypertension: Her blood pressure is elevated at 185/135, even higher than her typically chronically elevated  and poorly controlled blood pressure, likely secondary to pain, with no chest pain or shortness of breath reported. The plan is to treat the pain to help lower blood  pressure and get further management in emergency room or close follow up after pain management.       Bilateral hip pain  Sciatica of right side -     methylPREDNISolone Acetate -     Ketorolac Tromethamine -     oxyCODONE-Acetaminophen; Take 1 tablet by mouth every 4 (four) hours as needed for pain (for acute severe pain flare, replaces hydrocodone.).  Dispense: 10 tablet; Refill: 0 -     predniSONE; Take 2 pills for 3 days, 1 pill for 4 days  Dispense: 10 tablet; Refill: 0 -     Ketorolac Tromethamine; Take 1 tablet (10 mg total) by mouth every 6 (six) hours as needed.  Dispense: 20 tablet; Refill: 0  Urge incontinence of urine -     methylPREDNISolone Acetate -     POCT urinalysis dipstick  Flank pain -     Ketorolac Tromethamine; Take 1 tablet (10 mg total) by mouth every 6 (six) hours as needed.  Dispense: 20 tablet; Refill: 0

## 2022-12-29 NOTE — Progress Notes (Signed)
[facility]: [phone]   Routine Medical Office Visit  Patient:  Laurie Roman      Age: 47 y.o.       Sex:  female  Date:   12/29/2022 Patient Care Team: Jeani Sow, MD as PCP - General (Family Medicine) Today's Healthcare Provider: [name], MD   Assessment and Plan:   Assessment and Plan     Gregory was seen today for hip pain.  Right sided sciatica Assessment & Plan: Sciatica: Right-sided sciatica has been worsening over the past three months, with pain radiating down to the foot. Despite management with hydrocodone and gabapentin, she is not achieving adequate relief. She also reports urinary incontinence, which, along with fever, raises concerns for possible cauda equina syndrome or spinal infection. She agreed to go to ER for stat MRI to rule out these conditions, as an outpatient MRI is already ordered and held up for logistics reasons. For immediate pain relief, Toradol and methylprednisolone injections will be administered today, followed by Toradol tablets for 5 days and prednisone tablets for 7 days. Oxycodone tablets are prescribed for 1-2 days to manage acute pain until she goes to emergency room (she plans to hold off up to a day) A urinalysis is ordered today because she wonders if she has urinary tract infection (UTI) due to urinary incontinence worsening-but it was completely clear, making the progressive urinary incontinence even more concerning for cauda equina.  She is advised to avoid lifting and bending until the spine is cleared from MRI findings. Considering the severity, an ER visit for further management and possible surgical intervention is strongly recommended.  Given this patient's history and physical exam, the most likely cause for her back pain is degenerative osteoarthritis and degenerative disk disease of her vertebral column, possibly from lifting on her daughter to provide care.  I have considered and concluded the  patient has a very low likelihood of having one of the following serious causes of back pain: bone tumor (no tumors/masses), acute bone fracture (no severe trauma or bone loss), or pyelonephritis (no pain radiating to groin, costovertebral angle tenderness).   However, there is a small chance of aortic aneurysm (very  high blood pressure, long term smoking, but she has no ripping/tearing sensations), vertebral disk infection (fevers mild but no scrapes or IVDU), and subacute spinal compression (mild weakness/numbness, worsening bladder function and urinary incontinence).  She was advised to go to ER immediately due to theese symptom(s) and stated she intends to go but needs to make sure her daughter has someone to care for her first.   Standard treatment offered & documented here: For acute pain, rest, intermittent application of cold packs (later, may switch to heat, but do not sleep on heating pad), analgesics and muscle relaxants are recommended.  For more chronic pain, take as needed NSAID's (with plenty of fluids)  Proper lifting with avoidance of heavy lifting discussed.  Provided instructions in AVS for how to do stretches and discussed using it for a home back care exercise program with flexion exercise routine.  Work excuse provided and she will call or return to clinic prn if these symptoms worsen or fail to improve as anticipated.  She will see me or her Primary Care Provider (PCP) in follow up as soon as possible after emergency room evaluation for back pain with multiple red flags.  She understands the seriousness of her condition as it was explained in detail.   Chronic Pain Management: Her  current regimen of hydrocodone and gabapentin is not providing adequate relief for sciatica chronically, and she is also taking tizanidine 4mg  at night. We will consider transitioning to a Butrans patch or Suboxone for longer-lasting pain relief once the acute issue is resolved.    Hypertension: Her  blood pressure is elevated at 185/135, even higher than her typically chronically elevated  and poorly controlled blood pressure, likely secondary to pain, with no chest pain or shortness of breath reported. The plan is to treat the pain to help lower blood pressure and get further management in emergency room or close follow up after pain management.       Bilateral hip pain  Sciatica of right side -     methylPREDNISolone Acetate -     Ketorolac Tromethamine -     oxyCODONE-Acetaminophen; Take 1 tablet by mouth every 4 (four) hours as needed for pain (for acute severe pain flare, replaces hydrocodone.).  Dispense: 10 tablet; Refill: 0 -     predniSONE; Take 2 pills for 3 days, 1 pill for 4 days  Dispense: 10 tablet; Refill: 0 -     Ketorolac Tromethamine; Take 1 tablet (10 mg total) by mouth every 6 (six) hours as needed.  Dispense: 20 tablet; Refill: 0  Urge incontinence of urine Overview: Patient reports this today for first time, but very chronic and worsening Associated symptoms include flank pain, frequency, hematuria (blood with wiping and doesn't have periods.), hesitancy, nausea and urgency (sometimes can't feel the need to go). Pertinent negatives include no chills, discharge, possible pregnancy, sweats or vomiting. Urinary Frequency This is a chronic problem. The mixed incontinence started more than 1 year ago- many years ago associated with bladder mesh. The problem occurs every urination. The problem has been gradually worsening. The pain is at a severity of 0/10 in bladder but has severe right flank pain for 3-4 days. The patient is experiencing no pain elsewhere. There has been no fever. Associated symptoms include flank pain, frequency, hematuria (blood with wiping and doesn't have periods.), hesitancy, nausea and urgency (sometimes she can't feel the need to go). Pertinent negatives include no chills, discharge, possible pregnancy, sweats or vomiting. Results for orders placed or  performed in visit on 05/20/24POCT Urinalysis Dipstick Ketones, UA positive Spec Grav, UA >=1.030 (A) Leukocytes, UA Trace (A) Negative rest of ua negative.  Orders: -     methylPREDNISolone Acetate -     POCT urinalysis dipstick  Flank pain Overview: The likelihood that the flank pain is from uric acid stones is high, given the presence of uric acid crystals in the urine and the patient's symptoms  . Management should focus on pain control with NSAIDs but she reports pain is too severe even with Toradol and norco10, requests brief boost in pain control from percocet. Plan: Provide brief course percocet. Will encouraged patient on increasing fluid intake to facilitate stone passage, and dietary modifications to reduce uric acid levels. Antibiotics are not required at this time, as there is no evidence of infection from the urinalysis results. Further imaging studies, such as a non-contrast CT scan, could be considered to assess stone size and location, guiding further management.  Orders: -     Ketorolac Tromethamine; Take 1 tablet (10 mg total) by mouth every 6 (six) hours as needed.  Dispense: 20 tablet; Refill: 0     Recommended follow up: Return in about 1 week (around 01/05/2023) for close follow up acute illness.  Future Appointments  Date Time Provider  Department Center  01/05/2023  4:40 PM [facility] MR 1 [id number] [address] [facility]           Clinical Presentation:    47 y.o. female who has Migraine; ADHD; Essential hypertension; Mild obstructive sleep apnea; Controlled substance agreement signed; Gastroesophageal reflux disease without esophagitis; Left sided sciatica; Asthma; Maxillary sinusitis; Anxiety; Primary insomnia; Depression, major, single episode, severe (HCC); Cervicalgia; Chronic dental pain; Opioid dependence (HCC); B12 deficiency; Urinary incontinence; Flank pain; Uric acid crystalluria; and Right sided sciatica on their problem list. Her reasons/main concerns/chief  complaints for today's office visit are Hip Pain   AI-Extracted: Discussed the use of AI scribe software for clinical note transcription with the patient, who gave verbal consent to proceed.  History of Present Illness   The patient, with a history of chronic pain, presented with a chief complaint of worsening right hip pain and neck discomfort. The patient reported that the current pain episode has been ongoing for approximately three months, with the pain seeming to originate from the right hip and radiating down the right leg, suggestive of sciatica. The patient reported that the pain is somewhat relieved by leaning to the right and sitting on the right foot, but is exacerbated by bending over.  The patient has been managing the pain with hydrocodone and gabapentin (800mg  three times a day), but reported that these medications are not providing sufficient relief. The patient also reported taking tizanidine (4mg  at night) for muscle relaxation, but noted that it causes drowsiness.  In addition to the pain, the patient reported a recent increase in urinary incontinence, which has been a significant concern. The patient denied any new weakness in the legs, but acknowledged chronic weakness. The patient also reported a low-grade fever, but denied any recent injuries or infections that could explain this symptom.  The patient has been working as a Water quality scientist and reported that the pain has been flaring due to increased physical activity at work. The patient expressed concern about the impact of the pain on her ability to work and care for a dependent.  The patient expressed reluctance to change her current pain management regimen, but acknowledged the need for a more effective treatment plan.     Reviewed chart data:  has a past medical history of ADD (attention deficit disorder), Anxiety, Asthma, Blood transfusion without reported diagnosis, Depression, GERD (gastroesophageal reflux disease), IBS  (irritable bowel syndrome), Insomnia, Lumbago, Migraine, Opioid dependence (HCC) (08/21/2022), Other chest pain (08/17/2022), Sciatica, Sleep apnea, Tietze's disease, and Urinary incontinence (10/26/2022).  Current Outpatient Medications on File Prior to Visit  Medication Sig   albuterol (VENTOLIN HFA) 108 (90 Base) MCG/ACT inhaler Inhale 2 puffs into the lungs every 6 (six) hours as needed for wheezing or shortness of breath.   amLODipine (NORVASC) 10 MG tablet Take 1 tablet (10 mg total) by mouth daily. Replaces amlodipine 5, for uncontrolled blood pressure.   budesonide-formoterol (SYMBICORT) 160-4.5 MCG/ACT inhaler Inhale 2 puffs into the lungs 2 (two) times daily.   busPIRone (BUSPAR) 10 MG tablet Take 1 tablet (10 mg total) by mouth 2 (two) times daily.   chlorhexidine (PERIDEX) 0.12 % solution Use as directed 15 mLs in the mouth or throat 2 (two) times daily.   Erenumab-aooe (AIMOVIG) 140 MG/ML SOAJ Inject 140 mg into the skin every 30 (thirty) days.   escitalopram (LEXAPRO) 20 MG tablet Take 1 tablet (20 mg total) by mouth daily.   fluconazole (DIFLUCAN) 150 MG tablet Take 1 tablet (150 mg total) by mouth  every three (3) days as needed.   fluticasone (FLONASE) 50 MCG/ACT nasal spray Place 1 spray into both nostrils daily.   gabapentin (NEURONTIN) 800 MG tablet Take 1 tablet (800 mg total) by mouth 3 (three) times daily. Replaces prior dosing schedule.   HYDROcodone-acetaminophen (NORCO) 10-325 MG tablet Take 1 tablet by mouth every 8 (eight) hours as needed.   losartan (COZAAR) 100 MG tablet Take 1 tablet (100 mg total) by mouth daily.   metoprolol tartrate (LOPRESSOR) 25 MG tablet Take 1 tablet (25 mg total) by mouth 2 (two) times daily.   nystatin (MYCOSTATIN) 100000 UNIT/ML suspension Take 5 mLs (500,000 Units total) by mouth 4 (four) times daily. Swish and hold in mouth few seconds and swallow   nystatin cream (MYCOSTATIN) Apply 1 Application topically 2 (two) times daily.    ondansetron (ZOFRAN) 4 MG tablet Take 1 tablet (4 mg total) by mouth every 8 (eight) hours as needed for nausea or vomiting.   pantoprazole (PROTONIX) 40 MG tablet Take 1 tablet (40 mg total) by mouth daily.   rizatriptan (MAXALT) 10 MG tablet Take 1 tablet (10 mg total) by mouth as needed for migraine. May repeat in 2 hours if needed   tiZANidine (ZANAFLEX) 4 MG tablet Take 1 tablet (4 mg total) by mouth every 6 (six) hours as needed for muscle spasms.   amphetamine-dextroamphetamine (ADDERALL) 20 MG tablet Take 1 tablet (20 mg total) by mouth 2 (two) times daily.   Current Facility-Administered Medications on File Prior to Visit  Medication   ondansetron (ZOFRAN-ODT) disintegrating tablet 4 mg          Clinical Data Analysis:   Physical Exam  BP (!) 165/104 (BP Location: Left Arm, Patient Position: Sitting)   Pulse (!) 112   Temp 99 F (37.2 C) (Temporal)   Ht 4\' 11"  (1.499 m)   Wt 146 lb 9.6 oz (66.5 kg)   SpO2 97%   BMI 29.61 kg/m  Wt Readings from Last 10 Encounters:  12/29/22 146 lb 9.6 oz (66.5 kg)  12/14/22 143 lb 2 oz (64.9 kg)  11/04/22 140 lb 2 oz (63.6 kg)  10/05/22 137 lb 6 oz (62.3 kg)  08/26/22 144 lb (65.3 kg)  08/21/22 140 lb (63.5 kg)  08/17/22 138 lb (62.6 kg)  08/06/22 142 lb 4 oz (64.5 kg)  08/04/22 142 lb 4 oz (64.5 kg)  07/23/22 142 lb 6 oz (64.6 kg)   Vital signs reviewed.  Nursing notes reviewed. Weight trend reviewed. Abnormalities and Problem-Specific physical exam findings:  appears to be in severe pain, moving slowly with grimaces, able to walk without support / assistance but doing so slowly. Pain with bending back even slightly. Stiff walk.    General Appearance:  No acute distress appreciable.   Well-groomed, healthy-appearing female.  Well proportioned with no abnormal fat distribution.  Good muscle tone. Skin: Clear and well-hydrated. Pulmonary:  Normal work of breathing at rest, no respiratory distress apparent. SpO2: 97 %  Musculoskeletal:  All extremities are intact.  Neurological:  Awake, alert, oriented, and engaged.  No obvious focal neurological deficits or cognitive impairments.  Sensorium seems unclouded.   Speech is clear and coherent with logical content. Psychiatric:  Appropriate mood, pleasant and cooperative demeanor, thoughtful and engaged during the exam  Results Reviewed:      Results for orders placed or performed in visit on 12/29/22  POCT Urinalysis Dipstick  Result Value Ref Range   Color, UA yellow    Clarity, UA clear  Glucose, UA Negative Negative   Bilirubin, UA Negative    Ketones, UA Negative    Spec Grav, UA 1.020 1.010 - 1.025   Blood, UA Negative    pH, UA 6.0 5.0 - 8.0   Protein, UA Negative Negative   Urobilinogen, UA 0.2 0.2 or 1.0 E.U./dL   Nitrite, UA Negative    Leukocytes, UA Negative Negative   Appearance     Odor      Office Visit on 12/29/2022  Component Date Value   Color, UA 12/29/2022 yellow    Clarity, UA 12/29/2022 clear    Glucose, UA 12/29/2022 Negative    Bilirubin, UA 12/29/2022 Negative    Ketones, UA 12/29/2022 Negative    Spec Grav, UA 12/29/2022 1.020    Blood, UA 12/29/2022 Negative    pH, UA 12/29/2022 6.0    Protein, UA 12/29/2022 Negative    Urobilinogen, UA 12/29/2022 0.2    Nitrite, UA 12/29/2022 Negative    Leukocytes, UA 12/29/2022 Negative   Video Visit on 11/11/2022  Component Date Value   Neisseria Gonorrhea 11/11/2022 Negative    Chlamydia 11/11/2022 Negative    Trichomonas 11/11/2022 Negative    Bacterial Vaginitis (gar* 11/11/2022 Positive (A)    Candida Vaginitis 11/11/2022 Negative    Candida Glabrata 11/11/2022 Negative    Comment 11/11/2022 Normal Reference Range Bacterial Vaginosis - Negative    Comment 11/11/2022 Normal Reference Range Candida Species - Negative    Comment 11/11/2022 Normal Reference Range Candida Galbrata - Negative    Comment 11/11/2022 Normal Reference Range Trichomonas - Negative    Comment 11/11/2022 Normal  Reference Ranger Chlamydia - Negative    Comment 11/11/2022 Normal Reference Range Neisseria Gonorrhea - Negative   Office Visit on 10/26/2022  Component Date Value   Color, UA 10/26/2022 yellow    Clarity, UA 10/26/2022 clear    Glucose, UA 10/26/2022 Negative    Bilirubin, UA 10/26/2022 negative    Ketones, UA 10/26/2022 positive    Spec Grav, UA 10/26/2022 >=1.030 (A)    Blood, UA 10/26/2022 negative    pH, UA 10/26/2022 5.0    Protein, UA 10/26/2022 Negative    Urobilinogen, UA 10/26/2022 0.2    Nitrite, UA 10/26/2022 negative    Leukocytes, UA 10/26/2022 Trace (A)    MICRO NUMBER: 10/26/2022 [id number]    SPECIMEN QUALITY: 10/26/2022 Adequate    Sample Source 10/26/2022 URINE    STATUS: 10/26/2022 FINAL    Result: 10/26/2022 No Growth    Neisseria Gonorrhea 10/26/2022 Negative    Chlamydia 10/26/2022 Negative    Trichomonas 10/26/2022 Negative    Bacterial Vaginitis-Urine 10/26/2022 Negative    Candida Urine 10/26/2022 Negative    Molecular Comment 10/26/2022 For tests bacteria and/or candida, this specimen does not meet the    Molecular Comment 10/26/2022 strict criteria set by the FDA. The result interpretation should be    Molecular Comment 10/26/2022 considered in conjunction with the patient's clinical history.    Comment 10/26/2022 Normal Reference Ranger Chlamydia - Negative    Comment 10/26/2022 Normal Reference Range Neisseria Gonorrhea - Negative    Comment 10/26/2022 Normal Reference Range Trichomonas - Negative    WBC, UA 10/26/2022 0-2/hpf    RBC / HPF 10/26/2022 none seen    Squamous Epithelial / HPF 10/26/2022 Rare(0-4/hpf)    Uric Acid Crys, UA 10/26/2022 Presence of (A)    Amorphous 10/26/2022 Present (A)   Office Visit on 08/17/2022  Component Date Value   Vitamin B-12 08/18/2022 592  Glucose, Bld 08/18/2022 84    BUN 08/18/2022 19    Creat 08/18/2022 0.59    BUN/Creatinine Ratio 08/18/2022 SEE NOTE:    Sodium 08/18/2022 141    Potassium 08/18/2022  3.7    Chloride 08/18/2022 105    CO2 08/18/2022 23    Calcium 08/18/2022 9.8    Total Protein 08/18/2022 7.7    Albumin 08/18/2022 5.0    Globulin 08/18/2022 2.7    AG Ratio 08/18/2022 1.9    Total Bilirubin 08/18/2022 0.3    Alkaline phosphatase (AP* 08/18/2022 77    AST 08/18/2022 17    ALT 08/18/2022 11    WBC 08/18/2022 9.3    RBC 08/18/2022 4.01    Hemoglobin 08/18/2022 12.2    HCT 08/18/2022 35.4    MCV 08/18/2022 88.3    MCH 08/18/2022 30.4    MCHC 08/18/2022 34.5    RDW 08/18/2022 12.5    Platelets 08/18/2022 314    MPV 08/18/2022 11.3    Neutro Abs 08/18/2022 4,371    Lymphs Abs 08/18/2022 3,906 (H)    Absolute Monocytes 08/18/2022 688    Eosinophils Absolute 08/18/2022 233    Basophils Absolute 08/18/2022 102    Neutrophils Relative % 08/18/2022 47    Total Lymphocyte 08/18/2022 42.0    Monocytes Relative 08/18/2022 7.4    Eosinophils Relative 08/18/2022 2.5    Basophils Relative 08/18/2022 1.1    D-Dimer, Quant 08/18/2022 0.22   Office Visit on 08/06/2022  Component Date Value   Color, UA 08/06/2022 cloudy    Clarity, UA 08/06/2022 yellow    Glucose, UA 08/06/2022 Negative    Bilirubin, UA 08/06/2022 Negative    Ketones, UA 08/06/2022 Negative    Spec Grav, UA 08/06/2022 1.025    Blood, UA 08/06/2022 3+    pH, UA 08/06/2022 6.0    Protein, UA 08/06/2022 Positive (A)    Urobilinogen, UA 08/06/2022 0.2    Nitrite, UA 08/06/2022 Negative    Leukocytes, UA 08/06/2022 Moderate (2+) (A)   Office Visit on 08/04/2022  Component Date Value   Color, UA 08/04/2022 YELLOW    Glucose, UA 08/04/2022 Negative    Bilirubin, UA 08/04/2022 NEG    Ketones, UA 08/04/2022 NEG    Spec Grav, UA 08/04/2022 1.025    Blood, UA 08/04/2022 POSITIVE    pH, UA 08/04/2022 6.0    Protein, UA 08/04/2022 Positive (A)    Urobilinogen, UA 08/04/2022 0.2    Nitrite, UA 08/04/2022 NEG    Leukocytes, UA 08/04/2022 Moderate (2+) (A)    MICRO NUMBER: 08/05/2022 [id number]    SPECIMEN  QUALITY: 08/05/2022 Adequate    Sample Source 08/05/2022 URINE    STATUS: 08/05/2022 FINAL    ISOLATE 1: 08/05/2022 ESBL Escherichia coli (A)   Office Visit on 07/15/2022  Component Date Value   SARS Coronavirus 2 Ag 07/15/2022 Negative    Influenza A, POC 07/15/2022 Negative    Influenza B, POC 07/15/2022 Negative   Lab on 05/13/2022  Component Date Value   Neisseria Gonorrhea 05/13/2022 Negative    Chlamydia 05/13/2022 Negative    Trichomonas 05/13/2022 Negative    Bacterial Vaginitis-Urine 05/13/2022 Negative    Candida Urine 05/13/2022 Positive (A)    Candida Urine 05/13/2022 Negative (A)    Molecular Comment 05/13/2022 For tests bacteria and/or candida, this specimen does not meet the    Molecular Comment 05/13/2022 strict criteria set by the FDA. The result interpretation should be    Molecular Comment 05/13/2022 considered in conjunction with  the patient's clinical history.    Comment 05/13/2022 Normal Reference Ranger Chlamydia - Negative    Comment 05/13/2022 Normal Reference Range Neisseria Gonorrhea - Negative    Comment 05/13/2022 Normal Reference Range Trichomonas - Negative    WBC 05/13/2022 12.1 (H)    RBC 05/13/2022 4.04    Hemoglobin 05/13/2022 12.6    HCT 05/13/2022 37.2    MCV 05/13/2022 92.2    MCHC 05/13/2022 33.8    RDW 05/13/2022 13.7    Platelets 05/13/2022 398.0    Neutrophils Relative % 05/13/2022 63.9    Lymphocytes Relative 05/13/2022 24.9    Monocytes Relative 05/13/2022 7.9    Eosinophils Relative 05/13/2022 2.2    Basophils Relative 05/13/2022 1.1    Neutro Abs 05/13/2022 7.7    Lymphs Abs 05/13/2022 3.0    Monocytes Absolute 05/13/2022 1.0    Eosinophils Absolute 05/13/2022 0.3    Basophils Absolute 05/13/2022 0.1    Vitamin B-12 05/13/2022 236    Iron 05/13/2022 63    TIBC 05/13/2022 374    %SAT 05/13/2022 17    Ferritin 05/13/2022 48   Office Visit on 04/10/2022  Component Date Value   WBC 04/10/2022 9.2    RBC 04/10/2022 3.79 (L)     Hemoglobin 04/10/2022 11.5 (L)    HCT 04/10/2022 35.1    MCV 04/10/2022 92.6    MCH 04/10/2022 30.3    MCHC 04/10/2022 32.8    RDW 04/10/2022 11.9    Platelets 04/10/2022 334    MPV 04/10/2022 11.4    Neutro Abs 04/10/2022 4,122    Lymphs Abs 04/10/2022 3,892    Absolute Monocytes 04/10/2022 764    Eosinophils Absolute 04/10/2022 331    Basophils Absolute 04/10/2022 92    Neutrophils Relative % 04/10/2022 44.8    Total Lymphocyte 04/10/2022 42.3    Monocytes Relative 04/10/2022 8.3    Eosinophils Relative 04/10/2022 3.6    Basophils Relative 04/10/2022 1.0    TSH 04/10/2022 1.10    Glucose, Bld 04/10/2022 89    BUN 04/10/2022 15    Creat 04/10/2022 0.69    BUN/Creatinine Ratio 04/10/2022 SEE NOTE:    Sodium 04/10/2022 141    Potassium 04/10/2022 4.9    Chloride 04/10/2022 102    CO2 04/10/2022 28    Calcium 04/10/2022 9.4    Total Protein 04/10/2022 7.0    Albumin 04/10/2022 4.3    Globulin 04/10/2022 2.7    AG Ratio 04/10/2022 1.6    Total Bilirubin 04/10/2022 0.2    Alkaline phosphatase (AP* 04/10/2022 62    AST 04/10/2022 16    ALT 04/10/2022 10    Cholesterol 04/10/2022 235 (H)    HDL 04/10/2022 76    Triglycerides 04/10/2022 95    LDL Cholesterol (Calc) 04/10/2022 139 (H)    Total CHOL/HDL Ratio 04/10/2022 3.1    Non-HDL Cholesterol (Cal* 04/10/2022 159 (H)    Hgb A1c MFr Bld 04/10/2022 5.6    Mean Plasma Glucose 04/10/2022 114    eAG (mmol/L) 04/10/2022 6.3    Iron 04/10/2022 55    TIBC 04/10/2022 384    %SAT 04/10/2022 14 (L)    Vitamin B-12 04/10/2022 379    TEST NAME: 04/10/2022 IRON AND TOTAL IRON VITAMIN B    TEST CODE: 04/10/2022 [id number]    CLIENT CONTACT: 04/10/2022 KATIE NELSON    REPORT ALWAYS MESSAGE SI* 04/10/2022    Office Visit on 03/27/2022  Component Date Value   Color, UA 03/27/2022 YELLOW    Clarity,  UA 03/27/2022 CLOUDY    Glucose, UA 03/27/2022 Negative    Bilirubin, UA 03/27/2022 NEG    Ketones, UA 03/27/2022 NEG    Spec  Grav, UA 03/27/2022 1.020    Blood, UA 03/27/2022 NEG    pH, UA 03/27/2022 6.0    Protein, UA 03/27/2022 Negative    Urobilinogen, UA 03/27/2022 0.2    Nitrite, UA 03/27/2022 POS    Leukocytes, UA 03/27/2022 Small (1+) (A)    MICRO NUMBER: 03/27/2022 [id number]    SPECIMEN QUALITY: 03/27/2022 Adequate    Sample Source 03/27/2022 URINE, CLEAN CATCH    STATUS: 03/27/2022 FINAL    ISOLATE 1: 03/27/2022 Escherichia coli (A)   There may be more visits with results that are not included.   No image results found.   DG Nasal Bones  Result Date: 11/09/2022 CLINICAL DATA:  Trauma, pain EXAM: NASAL BONES - 3+ VIEW COMPARISON:  None Available. FINDINGS: No displaced fracture is seen. Visualized paranasal sinuses are clear. There is no deviation of nasal septum. Hyperostosis frontalis interna is seen. IMPRESSION: No displaced fracture is seen in the nasal bones. Visualized paranasal sinuses are clear. Electronically Signed   By: Ernie Avena M.D.   On: 11/09/2022 08:40       This encounter employed real-time, collaborative documentation. The patient actively reviewed and updated their medical record on a shared screen, ensuring transparency and facilitating joint problem-solving for the problem list, overview, and plan. This approach promotes accurate, informed care. The treatment plan was discussed and reviewed in detail, including medication safety, potential side effects, and all patient questions. We confirmed understanding and comfort with the plan. Follow-up instructions were established, including contacting the office for any concerns, returning if symptoms worsen, persist, or new symptoms develop, and precautions for potential emergency department visits. ----------------------------------------------------- [name], MD  12/29/2022 4:54 PM  [facility]:  [phone]

## 2022-12-29 NOTE — Assessment & Plan Note (Addendum)
Sciatica: Right-sided sciatica has been worsening over the past three months, with pain radiating down to the foot. Despite management with hydrocodone and gabapentin, she is not achieving adequate relief. She also reports urinary incontinence, which, along with fever, raises concerns for possible cauda equina syndrome or spinal infection. She agreed to go to ER for stat MRI to rule out these conditions, as an outpatient MRI is already ordered and held up for logistics reasons. For immediate pain relief, Toradol and methylprednisolone injections will be administered today, followed by Toradol tablets for 5 days and prednisone tablets for 7 days. Oxycodone tablets are prescribed for 1-2 days to manage acute pain until she goes to emergency room (she plans to hold off up to a day) A urinalysis is ordered today because she wonders if she has urinary tract infection (UTI) due to urinary incontinence worsening-but it was completely clear, making the progressive urinary incontinence even more concerning for cauda equina.  She is advised to avoid lifting and bending until the spine is cleared from MRI findings. Considering the severity, an ER visit for further management and possible surgical intervention is strongly recommended.  Given this patient's history and physical exam, the most likely cause for her back pain is degenerative osteoarthritis and degenerative disk disease of her vertebral column, possibly from lifting on her daughter to provide care.  I have considered and concluded the patient has a very low likelihood of having one of the following serious causes of back pain: bone tumor (no tumors/masses), acute bone fracture (no severe trauma or bone loss), or pyelonephritis (no pain radiating to groin, costovertebral angle tenderness).   However, there is a small chance of aortic aneurysm (very  high blood pressure, long term smoking, but she has no ripping/tearing sensations), vertebral disk infection (fevers  mild but no scrapes or IVDU), and subacute spinal compression (mild weakness/numbness, worsening bladder function and urinary incontinence).  She was advised to go to ER immediately due to theese symptom(s) and stated she intends to go but needs to make sure her daughter has someone to care for her first.   Standard treatment offered & documented here: For acute pain, rest, intermittent application of cold packs (later, may switch to heat, but do not sleep on heating pad), analgesics and muscle relaxants are recommended.  For more chronic pain, take as needed NSAID's (with plenty of fluids)  Proper lifting with avoidance of heavy lifting discussed.  Provided instructions in AVS for how to do stretches and discussed using it for a home back care exercise program with flexion exercise routine.  Work excuse provided and she will call or return to clinic prn if these symptoms worsen or fail to improve as anticipated.  She will see me or her Primary Care Provider (PCP) in follow up as soon as possible after emergency room evaluation for back pain with multiple red flags.  She understands the seriousness of her condition as it was explained in detail.   Chronic Pain Management: Her current regimen of hydrocodone and gabapentin is not providing adequate relief for sciatica chronically, and she is also taking tizanidine 4mg  at night. We will consider transitioning to a Butrans patch or Suboxone for longer-lasting pain relief once the acute issue is resolved.    Hypertension: Her blood pressure is elevated at 185/135, even higher than her typically chronically elevated  and poorly controlled blood pressure, likely secondary to pain, with no chest pain or shortness of breath reported. The plan is to treat the pain  to help lower blood pressure and get further management in emergency room or close follow up after pain management.

## 2022-12-31 ENCOUNTER — Other Ambulatory Visit: Payer: Self-pay | Admitting: Family Medicine

## 2022-12-31 ENCOUNTER — Encounter: Payer: Self-pay | Admitting: Family Medicine

## 2022-12-31 DIAGNOSIS — M5431 Sciatica, right side: Secondary | ICD-10-CM

## 2022-12-31 MED ORDER — HYDROCODONE-ACETAMINOPHEN 10-325 MG PO TABS
1.0000 | ORAL_TABLET | Freq: Three times a day (TID) | ORAL | 0 refills | Status: DC | PRN
Start: 2022-12-31 — End: 2023-01-14

## 2023-01-01 ENCOUNTER — Encounter: Payer: Self-pay | Admitting: Internal Medicine

## 2023-01-01 ENCOUNTER — Ambulatory Visit (INDEPENDENT_AMBULATORY_CARE_PROVIDER_SITE_OTHER): Payer: Medicaid Other | Admitting: Internal Medicine

## 2023-01-01 VITALS — BP 133/81 | HR 90 | Temp 97.7°F | Ht 59.0 in | Wt 146.6 lb

## 2023-01-01 DIAGNOSIS — L304 Erythema intertrigo: Secondary | ICD-10-CM

## 2023-01-01 DIAGNOSIS — M5440 Lumbago with sciatica, unspecified side: Secondary | ICD-10-CM

## 2023-01-01 DIAGNOSIS — G8929 Other chronic pain: Secondary | ICD-10-CM | POA: Diagnosis not present

## 2023-01-01 MED ORDER — METHYLPREDNISOLONE SODIUM SUCC 40 MG IJ SOLR
40.0000 mg | Freq: Once | INTRAMUSCULAR | Status: DC
Start: 2023-01-01 — End: 2023-01-01

## 2023-01-01 MED ORDER — NYSTATIN 100000 UNIT/GM EX CREA
1.0000 | TOPICAL_CREAM | Freq: Two times a day (BID) | CUTANEOUS | 5 refills | Status: DC
Start: 2023-01-01 — End: 2024-02-28

## 2023-01-01 MED ORDER — FLUCONAZOLE 150 MG PO TABS
150.0000 mg | ORAL_TABLET | ORAL | 0 refills | Status: DC | PRN
Start: 2023-01-01 — End: 2023-01-27

## 2023-01-01 MED ORDER — METHYLPREDNISOLONE ACETATE 80 MG/ML IJ SUSP
80.0000 mg | Freq: Once | INTRAMUSCULAR | Status: AC
Start: 2023-01-01 — End: 2023-01-01
  Administered 2023-01-01: 80 mg via INTRAMUSCULAR

## 2023-01-01 MED ORDER — KETOROLAC TROMETHAMINE 60 MG/2ML IM SOLN
60.0000 mg | Freq: Once | INTRAMUSCULAR | Status: AC
Start: 2023-01-01 — End: 2023-01-01
  Administered 2023-01-01: 60 mg via INTRAMUSCULAR

## 2023-01-01 MED ORDER — NYSTATIN 100000 UNIT/GM EX POWD
1.0000 | Freq: Three times a day (TID) | CUTANEOUS | 11 refills | Status: DC
Start: 2023-01-01 — End: 2024-02-28

## 2023-01-01 NOTE — Progress Notes (Unsigned)
[facility]: [id number]   Routine Medical Office Visit  Patient:  Laurie Roman      Age: 47 y.o.       Sex:  female  Date:   01/01/2023 Patient Care Team: Jeani Sow, MD as PCP - General (Family Medicine) Today's Healthcare Provider: [name], MD   Assessment and Plan:   Shanice was seen today for rash and back pain.  Intertrigo Overview: Chronic severe  Assessment & Plan: Discussed importance of keeping skin folds dry Gave diflucan as per patient's request  Gave nystatin cream and powder and described strengths Candelaria Celeste of each  Orders: -     Fluconazole; Take 1 tablet (150 mg total) by mouth every three (3) days as needed.  Dispense: 3 tablet; Refill: 0 -     Nystatin; Apply 1 Application topically 3 (three) times daily.  Dispense: 60 g; Refill: 11 -     Nystatin; Apply 1 Application topically 2 (two) times daily.  Dispense: 30 g; Refill: 5  Chronic bilateral low back pain with sciatica, sciatica laterality unspecified Assessment & Plan: Again advised patient to go to emergency room due to progressive urinary incontinence ,  but she did not go prior and doesn't plan to now, wants to wait for outpatient Gave solumedrol and Toradol injection(s) as per patient's request   Orders: -     Ketorolac Tromethamine -     methylPREDNISolone Acetate  Medical Decision Making: 2 or more stable chronic illnesses Prescription drug management     Recommended follow up: No follow-ups on file.  Future Appointments  Date Time Provider Department Center  01/05/2023  4:30 PM [facility] MR 1 [id number] [address] [facility]           Clinical Presentation:    47 y.o. female who has Migraine; ADHD; Essential hypertension; Mild obstructive sleep apnea; Controlled substance agreement signed; Gastroesophageal reflux disease without esophagitis; Left sided sciatica; Asthma; Maxillary sinusitis; Anxiety; Primary insomnia; Depression, major, single episode,  severe (HCC); Cervicalgia; Chronic dental pain; Opioid dependence (HCC); B12 deficiency; Urinary incontinence; Flank pain; Uric acid crystalluria; Right sided sciatica; Intertrigo; and Chronic bilateral low back pain with sciatica on their problem list. Her reasons/main concerns/chief complaints for today's office visit are Rash and Back Pain   Patient returns with improved but still severe low back pain/hip pain/sciatica after just completing prednisone for week.couldn't tolerate percocet due to itching.  Didn't go to emergency room as we had agreed upon.  Urinary incontinence is worsening but she wants to attribute to bladder sling from 20 years ago but she doesn't even realize she needs to go until too late. Have explained I don't think this is due to bladder sling.  Also wants diflucan  for persistent intractable intertrigo in inframammary fold. Doesn't like to wear bras but does wear cotton bra at work     has a past medical history of ADD (attention deficit disorder), Anxiety, Asthma, Blood transfusion without reported diagnosis, Depression, GERD (gastroesophageal reflux disease), IBS (irritable bowel syndrome), Insomnia, Lumbago, Migraine, Opioid dependence (HCC) (08/21/2022), Other chest pain (08/17/2022), Sciatica, Sleep apnea, Tietze's disease, and Urinary incontinence (10/26/2022). Current Outpatient Medications on File Prior to Visit  Medication Sig   albuterol (VENTOLIN HFA) 108 (90 Base) MCG/ACT inhaler Inhale 2 puffs into the lungs every 6 (six) hours as needed for wheezing or shortness of breath.   amLODipine (NORVASC) 10 MG tablet Take 1 tablet (10 mg total) by mouth daily. Replaces amlodipine 5, for uncontrolled  blood pressure.   budesonide-formoterol (SYMBICORT) 160-4.5 MCG/ACT inhaler Inhale 2 puffs into the lungs 2 (two) times daily.   busPIRone (BUSPAR) 10 MG tablet Take 1 tablet (10 mg total) by mouth 2 (two) times daily.   chlorhexidine (PERIDEX) 0.12 % solution Use as directed  15 mLs in the mouth or throat 2 (two) times daily.   Erenumab-aooe (AIMOVIG) 140 MG/ML SOAJ Inject 140 mg into the skin every 30 (thirty) days.   escitalopram (LEXAPRO) 20 MG tablet Take 1 tablet (20 mg total) by mouth daily.   fluticasone (FLONASE) 50 MCG/ACT nasal spray Place 1 spray into both nostrils daily.   gabapentin (NEURONTIN) 800 MG tablet Take 1 tablet (800 mg total) by mouth 3 (three) times daily. Replaces prior dosing schedule.   HYDROcodone-acetaminophen (NORCO) 10-325 MG tablet Take 1 tablet by mouth every 8 (eight) hours as needed.   ketorolac (TORADOL) 10 MG tablet Take 1 tablet (10 mg total) by mouth every 6 (six) hours as needed.   losartan (COZAAR) 100 MG tablet Take 1 tablet (100 mg total) by mouth daily.   metoprolol tartrate (LOPRESSOR) 25 MG tablet Take 1 tablet (25 mg total) by mouth 2 (two) times daily.   nystatin (MYCOSTATIN) 100000 UNIT/ML suspension Take 5 mLs (500,000 Units total) by mouth 4 (four) times daily. Swish and hold in mouth few seconds and swallow   nystatin cream (MYCOSTATIN) Apply 1 Application topically 2 (two) times daily.   ondansetron (ZOFRAN) 4 MG tablet Take 1 tablet (4 mg total) by mouth every 8 (eight) hours as needed for nausea or vomiting.   pantoprazole (PROTONIX) 40 MG tablet Take 1 tablet (40 mg total) by mouth daily.   predniSONE (DELTASONE) 20 MG tablet Take 2 pills for 3 days, 1 pill for 4 days   rizatriptan (MAXALT) 10 MG tablet Take 1 tablet (10 mg total) by mouth as needed for migraine. May repeat in 2 hours if needed   tiZANidine (ZANAFLEX) 4 MG tablet Take 1 tablet (4 mg total) by mouth every 6 (six) hours as needed for muscle spasms.   amphetamine-dextroamphetamine (ADDERALL) 20 MG tablet Take 1 tablet (20 mg total) by mouth 2 (two) times daily.   Current Facility-Administered Medications on File Prior to Visit  Medication   ondansetron (ZOFRAN-ODT) disintegrating tablet 4 mg   Medications Discontinued During This Encounter   Medication Reason   fluconazole (DIFLUCAN) 150 MG tablet Reorder   methylPREDNISolone sodium succinate (SOLU-MEDROL) 40 mg/mL injection 40 mg          Clinical Data Analysis:   Physical Exam  BP 133/81 (BP Location: Left Arm, Patient Position: Sitting)   Pulse 90   Temp 97.7 F (36.5 C) (Temporal)   Ht 4\' 11"  (1.499 m)   Wt 146 lb 9.6 oz (66.5 kg)   SpO2 99%   BMI 29.61 kg/m  Wt Readings from Last 10 Encounters:  01/01/23 146 lb 9.6 oz (66.5 kg)  12/29/22 146 lb 9.6 oz (66.5 kg)  12/14/22 143 lb 2 oz (64.9 kg)  11/04/22 140 lb 2 oz (63.6 kg)  10/05/22 137 lb 6 oz (62.3 kg)  08/26/22 144 lb (65.3 kg)  08/21/22 140 lb (63.5 kg)  08/17/22 138 lb (62.6 kg)  08/06/22 142 lb 4 oz (64.5 kg)  08/04/22 142 lb 4 oz (64.5 kg)   Vital signs reviewed.  Nursing notes reviewed. Weight trend reviewed. Abnormalities and Problem-Specific physical exam findings:  slow to sit and stand and appears to be in pain, but  much better than prior visit.  Rash not inspected as chaperone was N/A.  General Appearance:  No acute distress appreciable.   Well-groomed, healthy-appearing female.  Well proportioned with no abnormal fat distribution.  Good muscle tone. Skin: Clear and well-hydrated. Pulmonary:  Normal work of breathing at rest, no respiratory distress apparent. SpO2: 99 %  Musculoskeletal: All extremities are intact.  Neurological:  Awake, alert, oriented, and engaged.  No obvious focal neurological deficits or cognitive impairments.  Sensorium seems unclouded.   Speech is clear and coherent with logical content. Psychiatric:  Appropriate mood, pleasant and cooperative demeanor, thoughtful and engaged during the exam  Results Reviewed:      No results found for any visits on 01/01/23.  Office Visit on 12/29/2022  Component Date Value   Color, UA 12/29/2022 yellow    Clarity, UA 12/29/2022 clear    Glucose, UA 12/29/2022 Negative    Bilirubin, UA 12/29/2022 Negative    Ketones, UA  12/29/2022 Negative    Spec Grav, UA 12/29/2022 1.020    Blood, UA 12/29/2022 Negative    pH, UA 12/29/2022 6.0    Protein, UA 12/29/2022 Negative    Urobilinogen, UA 12/29/2022 0.2    Nitrite, UA 12/29/2022 Negative    Leukocytes, UA 12/29/2022 Negative   Video Visit on 11/11/2022  Component Date Value   Neisseria Gonorrhea 11/11/2022 Negative    Chlamydia 11/11/2022 Negative    Trichomonas 11/11/2022 Negative    Bacterial Vaginitis (gar* 11/11/2022 Positive (A)    Candida Vaginitis 11/11/2022 Negative    Candida Glabrata 11/11/2022 Negative    Comment 11/11/2022 Normal Reference Range Bacterial Vaginosis - Negative    Comment 11/11/2022 Normal Reference Range Candida Species - Negative    Comment 11/11/2022 Normal Reference Range Candida Galbrata - Negative    Comment 11/11/2022 Normal Reference Range Trichomonas - Negative    Comment 11/11/2022 Normal Reference Ranger Chlamydia - Negative    Comment 11/11/2022 Normal Reference Range Neisseria Gonorrhea - Negative   Office Visit on 10/26/2022  Component Date Value   Color, UA 10/26/2022 yellow    Clarity, UA 10/26/2022 clear    Glucose, UA 10/26/2022 Negative    Bilirubin, UA 10/26/2022 negative    Ketones, UA 10/26/2022 positive    Spec Grav, UA 10/26/2022 >=1.030 (A)    Blood, UA 10/26/2022 negative    pH, UA 10/26/2022 5.0    Protein, UA 10/26/2022 Negative    Urobilinogen, UA 10/26/2022 0.2    Nitrite, UA 10/26/2022 negative    Leukocytes, UA 10/26/2022 Trace (A)    MICRO NUMBER: 10/26/2022 [id number]    SPECIMEN QUALITY: 10/26/2022 Adequate    Sample Source 10/26/2022 URINE    STATUS: 10/26/2022 FINAL    Result: 10/26/2022 No Growth    Neisseria Gonorrhea 10/26/2022 Negative    Chlamydia 10/26/2022 Negative    Trichomonas 10/26/2022 Negative    Bacterial Vaginitis-Urine 10/26/2022 Negative    Candida Urine 10/26/2022 Negative    Molecular Comment 10/26/2022 For tests bacteria and/or candida, this specimen does not  meet the    Molecular Comment 10/26/2022 strict criteria set by the FDA. The result interpretation should be    Molecular Comment 10/26/2022 considered in conjunction with the patient's clinical history.    Comment 10/26/2022 Normal Reference Ranger Chlamydia - Negative    Comment 10/26/2022 Normal Reference Range Neisseria Gonorrhea - Negative    Comment 10/26/2022 Normal Reference Range Trichomonas - Negative    WBC, UA 10/26/2022 0-2/hpf    RBC / HPF 10/26/2022 none  seen    Squamous Epithelial / HPF 10/26/2022 Rare(0-4/hpf)    Uric Acid Crys, UA 10/26/2022 Presence of (A)    Amorphous 10/26/2022 Present (A)   Office Visit on 08/17/2022  Component Date Value   Vitamin B-12 08/18/2022 592    Glucose, Bld 08/18/2022 84    BUN 08/18/2022 19    Creat 08/18/2022 0.59    BUN/Creatinine Ratio 08/18/2022 SEE NOTE:    Sodium 08/18/2022 141    Potassium 08/18/2022 3.7    Chloride 08/18/2022 105    CO2 08/18/2022 23    Calcium 08/18/2022 9.8    Total Protein 08/18/2022 7.7    Albumin 08/18/2022 5.0    Globulin 08/18/2022 2.7    AG Ratio 08/18/2022 1.9    Total Bilirubin 08/18/2022 0.3    Alkaline phosphatase (AP* 08/18/2022 77    AST 08/18/2022 17    ALT 08/18/2022 11    WBC 08/18/2022 9.3    RBC 08/18/2022 4.01    Hemoglobin 08/18/2022 12.2    HCT 08/18/2022 35.4    MCV 08/18/2022 88.3    MCH 08/18/2022 30.4    MCHC 08/18/2022 34.5    RDW 08/18/2022 12.5    Platelets 08/18/2022 314    MPV 08/18/2022 11.3    Neutro Abs 08/18/2022 4,371    Lymphs Abs 08/18/2022 3,906 (H)    Absolute Monocytes 08/18/2022 688    Eosinophils Absolute 08/18/2022 233    Basophils Absolute 08/18/2022 102    Neutrophils Relative % 08/18/2022 47    Total Lymphocyte 08/18/2022 42.0    Monocytes Relative 08/18/2022 7.4    Eosinophils Relative 08/18/2022 2.5    Basophils Relative 08/18/2022 1.1    D-Dimer, Quant 08/18/2022 0.22   Office Visit on 08/06/2022  Component Date Value   Color, UA 08/06/2022  cloudy    Clarity, UA 08/06/2022 yellow    Glucose, UA 08/06/2022 Negative    Bilirubin, UA 08/06/2022 Negative    Ketones, UA 08/06/2022 Negative    Spec Grav, UA 08/06/2022 1.025    Blood, UA 08/06/2022 3+    pH, UA 08/06/2022 6.0    Protein, UA 08/06/2022 Positive (A)    Urobilinogen, UA 08/06/2022 0.2    Nitrite, UA 08/06/2022 Negative    Leukocytes, UA 08/06/2022 Moderate (2+) (A)   Office Visit on 08/04/2022  Component Date Value   Color, UA 08/04/2022 YELLOW    Glucose, UA 08/04/2022 Negative    Bilirubin, UA 08/04/2022 NEG    Ketones, UA 08/04/2022 NEG    Spec Grav, UA 08/04/2022 1.025    Blood, UA 08/04/2022 POSITIVE    pH, UA 08/04/2022 6.0    Protein, UA 08/04/2022 Positive (A)    Urobilinogen, UA 08/04/2022 0.2    Nitrite, UA 08/04/2022 NEG    Leukocytes, UA 08/04/2022 Moderate (2+) (A)    MICRO NUMBER: 08/05/2022 [id number]    SPECIMEN QUALITY: 08/05/2022 Adequate    Sample Source 08/05/2022 URINE    STATUS: 08/05/2022 FINAL    ISOLATE 1: 08/05/2022 ESBL Escherichia coli (A)   Office Visit on 07/15/2022  Component Date Value   SARS Coronavirus 2 Ag 07/15/2022 Negative    Influenza A, POC 07/15/2022 Negative    Influenza B, POC 07/15/2022 Negative   Lab on 05/13/2022  Component Date Value   Neisseria Gonorrhea 05/13/2022 Negative    Chlamydia 05/13/2022 Negative    Trichomonas 05/13/2022 Negative    Bacterial Vaginitis-Urine 05/13/2022 Negative    Candida Urine 05/13/2022 Positive (A)    Candida Urine 05/13/2022  Negative (A)    Molecular Comment 05/13/2022 For tests bacteria and/or candida, this specimen does not meet the    Molecular Comment 05/13/2022 strict criteria set by the FDA. The result interpretation should be    Molecular Comment 05/13/2022 considered in conjunction with the patient's clinical history.    Comment 05/13/2022 Normal Reference Ranger Chlamydia - Negative    Comment 05/13/2022 Normal Reference Range Neisseria Gonorrhea - Negative     Comment 05/13/2022 Normal Reference Range Trichomonas - Negative    WBC 05/13/2022 12.1 (H)    RBC 05/13/2022 4.04    Hemoglobin 05/13/2022 12.6    HCT 05/13/2022 37.2    MCV 05/13/2022 92.2    MCHC 05/13/2022 33.8    RDW 05/13/2022 13.7    Platelets 05/13/2022 398.0    Neutrophils Relative % 05/13/2022 63.9    Lymphocytes Relative 05/13/2022 24.9    Monocytes Relative 05/13/2022 7.9    Eosinophils Relative 05/13/2022 2.2    Basophils Relative 05/13/2022 1.1    Neutro Abs 05/13/2022 7.7    Lymphs Abs 05/13/2022 3.0    Monocytes Absolute 05/13/2022 1.0    Eosinophils Absolute 05/13/2022 0.3    Basophils Absolute 05/13/2022 0.1    Vitamin B-12 05/13/2022 236    Iron 05/13/2022 63    TIBC 05/13/2022 374    %SAT 05/13/2022 17    Ferritin 05/13/2022 48   Office Visit on 04/10/2022  Component Date Value   WBC 04/10/2022 9.2    RBC 04/10/2022 3.79 (L)    Hemoglobin 04/10/2022 11.5 (L)    HCT 04/10/2022 35.1    MCV 04/10/2022 92.6    MCH 04/10/2022 30.3    MCHC 04/10/2022 32.8    RDW 04/10/2022 11.9    Platelets 04/10/2022 334    MPV 04/10/2022 11.4    Neutro Abs 04/10/2022 4,122    Lymphs Abs 04/10/2022 3,892    Absolute Monocytes 04/10/2022 764    Eosinophils Absolute 04/10/2022 331    Basophils Absolute 04/10/2022 92    Neutrophils Relative % 04/10/2022 44.8    Total Lymphocyte 04/10/2022 42.3    Monocytes Relative 04/10/2022 8.3    Eosinophils Relative 04/10/2022 3.6    Basophils Relative 04/10/2022 1.0    TSH 04/10/2022 1.10    Glucose, Bld 04/10/2022 89    BUN 04/10/2022 15    Creat 04/10/2022 0.69    BUN/Creatinine Ratio 04/10/2022 SEE NOTE:    Sodium 04/10/2022 141    Potassium 04/10/2022 4.9    Chloride 04/10/2022 102    CO2 04/10/2022 28    Calcium 04/10/2022 9.4    Total Protein 04/10/2022 7.0    Albumin 04/10/2022 4.3    Globulin 04/10/2022 2.7    AG Ratio 04/10/2022 1.6    Total Bilirubin 04/10/2022 0.2    Alkaline phosphatase (AP* 04/10/2022 62     AST 04/10/2022 16    ALT 04/10/2022 10    Cholesterol 04/10/2022 235 (H)    HDL 04/10/2022 76    Triglycerides 04/10/2022 95    LDL Cholesterol (Calc) 04/10/2022 139 (H)    Total CHOL/HDL Ratio 04/10/2022 3.1    Non-HDL Cholesterol (Cal* 04/10/2022 159 (H)    Hgb A1c MFr Bld 04/10/2022 5.6    Mean Plasma Glucose 04/10/2022 114    eAG (mmol/L) 04/10/2022 6.3    Iron 04/10/2022 55    TIBC 04/10/2022 384    %SAT 04/10/2022 14 (L)    Vitamin B-12 04/10/2022 379    TEST NAME: 04/10/2022 IRON AND TOTAL IRON  VITAMIN B    TEST CODE: 04/10/2022 [id number]    CLIENT CONTACT: 04/10/2022 KATIE NELSON    REPORT ALWAYS MESSAGE SI* 04/10/2022    Office Visit on 03/27/2022  Component Date Value   Color, UA 03/27/2022 YELLOW    Clarity, UA 03/27/2022 CLOUDY    Glucose, UA 03/27/2022 Negative    Bilirubin, UA 03/27/2022 NEG    Ketones, UA 03/27/2022 NEG    Spec Grav, UA 03/27/2022 1.020    Blood, UA 03/27/2022 NEG    pH, UA 03/27/2022 6.0    Protein, UA 03/27/2022 Negative    Urobilinogen, UA 03/27/2022 0.2    Nitrite, UA 03/27/2022 POS    Leukocytes, UA 03/27/2022 Small (1+) (A)    MICRO NUMBER: 03/27/2022 [id number]    SPECIMEN QUALITY: 03/27/2022 Adequate    Sample Source 03/27/2022 URINE, CLEAN CATCH    STATUS: 03/27/2022 FINAL    ISOLATE 1: 03/27/2022 Escherichia coli (A)   There may be more visits with results that are not included.   No image results found.   DG Nasal Bones  Result Date: 11/09/2022 CLINICAL DATA:  Trauma, pain EXAM: NASAL BONES - 3+ VIEW COMPARISON:  None Available. FINDINGS: No displaced fracture is seen. Visualized paranasal sinuses are clear. There is no deviation of nasal septum. Hyperostosis frontalis interna is seen. IMPRESSION: No displaced fracture is seen in the nasal bones. Visualized paranasal sinuses are clear. Electronically Signed   By: Ernie Avena M.D.   On: 11/09/2022 08:40       This encounter employed real-time, collaborative  documentation. The patient actively reviewed and updated their medical record on a shared screen, ensuring transparency and facilitating joint problem-solving for the problem list, overview, and plan. This approach promotes accurate, informed care. The treatment plan was discussed and reviewed in detail, including medication safety, potential side effects, and all patient questions. We confirmed understanding and comfort with the plan. Follow-up instructions were established, including contacting the office for any concerns, returning if symptoms worsen, persist, or new symptoms develop, and precautions for potential emergency department visits. ----------------------------------------------------- [name], MD  01/02/2023 1:18 PM  [facility]  [phone]

## 2023-01-01 NOTE — Patient Instructions (Signed)
It was a pleasure seeing you today! Your health and satisfaction are our top priorities.  Glenetta Hew, MD  Your Providers PCP: Jeani Sow, MD,  [phone]) Referring Provider: Jeani Sow, MD,  [phone])  NEXT STEPS: [x]  Early Intervention: Schedule sooner appointment, call our on-call services, or go to emergency room if there is any significant Increase in pain or discomfort New or worsening symptoms Sudden or severe changes in your health [x]  Flexible Follow-Up: We recommend a No follow-ups on file. for optimal routine care. This allows for progress monitoring and treatment adjustments. [x]  Preventive Care: Schedule your annual preventive care visit! It's typically covered by insurance and helps identify potential health issues early. [x]  Lab & X-ray Appointments: Incomplete tests scheduled today, or call to schedule. X-rays:  [facility] (M-F, 8:30am-noon or 1pm-5pm). [x]  Medical Information Release: Sign a release form at front desk to obtain relevant medical information we don't have.  MAKING THE MOST OF OUR FOCUSED 20 MINUTE APPOINTMENTS: [x]   Clearly state your top concerns at the beginning of the visit to focus our discussion [x]   If you anticipate you will need more time, please inform the front desk during scheduling - we can book multiple appointments in the same week. [x]   If you have transportation problems- use our convenient video appointments or ask about transportation support. [x]   We can get down to business faster if you use MyChart to update information before the visit and submit non-urgent questions before your visit. Thank you for taking the time to provide details through MyChart.  Let our nurse know and she can import this information into your encounter documents.  Arrival and Wait Times: [x]   Arriving on time ensures that everyone receives prompt attention. [x]   Early morning (8a) and afternoon (1p) appointments tend to have  shortest wait times. [x]   Unfortunately, we cannot delay appointments for late arrivals or hold slots during phone calls.  Getting Answers and Following Up [x]   Simple Questions & Concerns: For quick questions or basic follow-up after your visit, reach [place] at [phone] or MyChart messaging. [x]   Complex Concerns: If your concern is more complex, scheduling an appointment might be best. Discuss this with the staff to find the most suitable option. [x]   Lab & Imaging Results: We'll contact you directly if results are abnormal or you don't use MyChart. Most normal results will be on MyChart within 2-3 business days, with a review message from Dr. Jon Billings. Haven't heard back in 2 weeks? Need results sooner? Contact us at [phone]. [x]   Referrals: Our referral coordinator will manage specialist referrals. The specialist's office should contact you within 2 weeks to schedule an appointment. Call us if you haven't heard from them after 2 weeks.  Staying Connected [x]   MyChart: Activate your MyChart for the fastest way to access results and message [place]. See the last page of this paperwork for instructions on how to activate.  Bring to Your Next Appointment [x]   Medications: Please bring all your medication bottles to your next appointment to ensure we have an accurate record of your prescriptions. [x]   Health Diaries: If you're monitoring any health conditions at home, keeping a diary of your readings can be very helpful for discussions at your next appointment.  Billing [x]   X-ray & Lab Orders: These are billed by separate companies. Contact the invoicing company directly for questions or concerns. [x]   Visit Charges: Discuss any billing inquiries with our administrative services team.  Your  Satisfaction Matters [x]   Share Your Experience: We strive for your satisfaction! If you have any complaints, or preferably compliments, please let Dr. Jon Billings know directly or contact our Practice  Administrators, Edwena Felty or [employer], by asking at the front desk.   Reviewing Your Records [x]   Review this early draft of your clinical encounter notes below and the final encounter summary tomorrow on MyChart after its been completed.  All orders placed so far are visible here: Intertrigo           For a patient with persistent intertrigo under the breasts that hasn't responded to nystatin, fluconazole (Diflucan), and good-fitting bras, here are some additional strategies to consider:  1. Alternative antifungal treatments:    - Topical azoles (e.g., clotrimazole, miconazole)    - Topical allylamines (e.g., terbinafine)    - Ciclopirox olamine cream  2. Antibacterial treatments:    - Topical antibiotics (e.g., mupirocin, fusidic acid)    - Antiseptic washes (e.g., chlorhexidine)  3. Anti-inflammatory options:    - Low-potency topical corticosteroids for short-term use    - Non-steroidal anti-inflammatory creams (e.g., crisaborole)  4. Moisture management:    - Antiperspirants applied to the affected area    - Moisture-wicking fabrics for bras and undershirts    - Absorbent powders (e.g., Zeasorb-AF)  5. Barrier methods:    - Zinc oxide cream    - Petrolatum-based ointments    - Specialized intertrigo creams (e.g., InterDry AG)  6. Lifestyle modifications:    - Regular cleansing with mild, pH-balanced soap    - Thorough drying after bathing or sweating    - Wearing loose-fitting, breathable clothing    - Weight loss, if applicable  7. Environmental controls:    - Using a fan or air conditioning to reduce sweating    - Avoiding hot, humid environments when possible  8. Alternative therapies:    - Tea tree oil (diluted)    - Probiotics (topical or oral)    - Bleach baths (dilute, under medical supervision)  9. Phototherapy:    - Narrow-band UVB therapy (in resistant cases)  10. Systemic treatments (for severe, resistant cases):     - Oral  itraconazole     - Oral terbinafine  It's important to identify and address any underlying conditions that may be contributing to the intertrigo, such as diabetes or immune disorders. A combination of these approaches may be necessary, and treatment should be tailored to the individual patient's needs and response.  Would you like me to elaborate on any of these suggestions?

## 2023-01-02 DIAGNOSIS — L304 Erythema intertrigo: Secondary | ICD-10-CM | POA: Insufficient documentation

## 2023-01-02 DIAGNOSIS — G8929 Other chronic pain: Secondary | ICD-10-CM | POA: Insufficient documentation

## 2023-01-02 NOTE — Assessment & Plan Note (Signed)
Again advised patient to go to emergency room due to progressive urinary incontinence ,  but she did not go prior and doesn't plan to now, wants to wait for outpatient Gave solumedrol and Toradol injection(s) as per patient's request

## 2023-01-02 NOTE — Assessment & Plan Note (Signed)
Discussed importance of keeping skin folds dry Gave diflucan as per patient's request  Gave nystatin cream and powder and described strengths Laurie Roman of each

## 2023-01-05 ENCOUNTER — Ambulatory Visit
Admission: RE | Admit: 2023-01-05 | Discharge: 2023-01-05 | Disposition: A | Discharge status: Home / Self Care | Payer: Medicaid Other | Source: Ambulatory Visit | Attending: Family Medicine | Admitting: Family Medicine

## 2023-01-05 DIAGNOSIS — M47816 Spondylosis without myelopathy or radiculopathy, lumbar region: Secondary | ICD-10-CM | POA: Diagnosis not present

## 2023-01-05 DIAGNOSIS — M5432 Sciatica, left side: Secondary | ICD-10-CM

## 2023-01-14 ENCOUNTER — Other Ambulatory Visit: Payer: Self-pay | Admitting: Family Medicine

## 2023-01-14 DIAGNOSIS — M5431 Sciatica, right side: Secondary | ICD-10-CM

## 2023-01-14 MED ORDER — HYDROCODONE-ACETAMINOPHEN 10-325 MG PO TABS
1.0000 | ORAL_TABLET | Freq: Three times a day (TID) | ORAL | 0 refills | Status: DC | PRN
Start: 2023-01-14 — End: 2023-01-29

## 2023-01-18 ENCOUNTER — Ambulatory Visit: Payer: Medicaid Other | Admitting: Family Medicine

## 2023-01-21 ENCOUNTER — Other Ambulatory Visit: Payer: Self-pay | Admitting: Family Medicine

## 2023-01-21 DIAGNOSIS — F902 Attention-deficit hyperactivity disorder, combined type: Secondary | ICD-10-CM

## 2023-01-21 MED ORDER — AMPHETAMINE-DEXTROAMPHETAMINE 20 MG PO TABS
20.0000 mg | ORAL_TABLET | Freq: Two times a day (BID) | ORAL | 0 refills | Status: DC
Start: 2023-01-21 — End: 2023-02-22

## 2023-01-21 NOTE — Progress Notes (Signed)
Bp's have been running "good".   Has to study and do exam.  Needs the adderall

## 2023-01-25 ENCOUNTER — Encounter: Payer: Self-pay | Admitting: Family Medicine

## 2023-01-25 ENCOUNTER — Other Ambulatory Visit (HOSPITAL_COMMUNITY)
Admission: RE | Admit: 2023-01-25 | Discharge: 2023-01-25 | Disposition: A | Discharge status: Home / Self Care | Payer: Medicaid Other | Source: Ambulatory Visit | Attending: Family Medicine | Admitting: Family Medicine

## 2023-01-25 ENCOUNTER — Ambulatory Visit (INDEPENDENT_AMBULATORY_CARE_PROVIDER_SITE_OTHER): Payer: Medicaid Other | Admitting: Family Medicine

## 2023-01-25 VITALS — BP 136/82 | HR 103 | Temp 98.0°F | Ht 59.0 in | Wt 146.8 lb

## 2023-01-25 DIAGNOSIS — G4733 Obstructive sleep apnea (adult) (pediatric): Secondary | ICD-10-CM | POA: Diagnosis not present

## 2023-01-25 DIAGNOSIS — M5442 Lumbago with sciatica, left side: Secondary | ICD-10-CM | POA: Diagnosis not present

## 2023-01-25 DIAGNOSIS — Z202 Contact with and (suspected) exposure to infections with a predominantly sexual mode of transmission: Secondary | ICD-10-CM | POA: Diagnosis not present

## 2023-01-25 DIAGNOSIS — F419 Anxiety disorder, unspecified: Secondary | ICD-10-CM

## 2023-01-25 DIAGNOSIS — E538 Deficiency of other specified B group vitamins: Secondary | ICD-10-CM

## 2023-01-25 DIAGNOSIS — M5441 Lumbago with sciatica, right side: Secondary | ICD-10-CM | POA: Diagnosis not present

## 2023-01-25 DIAGNOSIS — F5101 Primary insomnia: Secondary | ICD-10-CM

## 2023-01-25 DIAGNOSIS — I10 Essential (primary) hypertension: Secondary | ICD-10-CM | POA: Diagnosis not present

## 2023-01-25 DIAGNOSIS — G8929 Other chronic pain: Secondary | ICD-10-CM | POA: Diagnosis not present

## 2023-01-25 LAB — HEMOGLOBIN A1C: Hgb A1c MFr Bld: 5.8 % (ref 4.6–6.5)

## 2023-01-25 LAB — CBC WITH DIFFERENTIAL/PLATELET
Basophils Absolute: 0.1 10*3/uL (ref 0.0–0.1)
Basophils Relative: 0.7 % (ref 0.0–3.0)
Eosinophils Absolute: 0.1 10*3/uL (ref 0.0–0.7)
Eosinophils Relative: 1.1 % (ref 0.0–5.0)
HCT: 37.6 % (ref 36.0–46.0)
Hemoglobin: 12.5 g/dL (ref 12.0–15.0)
Lymphocytes Relative: 27.2 % (ref 12.0–46.0)
Lymphs Abs: 3.2 10*3/uL (ref 0.7–4.0)
MCHC: 33.2 g/dL (ref 30.0–36.0)
MCV: 93.4 fl (ref 78.0–100.0)
Monocytes Absolute: 0.8 10*3/uL (ref 0.1–1.0)
Monocytes Relative: 6.8 % (ref 3.0–12.0)
Neutro Abs: 7.6 10*3/uL (ref 1.4–7.7)
Neutrophils Relative %: 64.2 % (ref 43.0–77.0)
Platelets: 375 10*3/uL (ref 150.0–400.0)
RBC: 4.02 Mil/uL (ref 3.87–5.11)
RDW: 13.8 % (ref 11.5–15.5)
WBC: 11.8 10*3/uL — ABNORMAL HIGH (ref 4.0–10.5)

## 2023-01-25 LAB — VITAMIN B12: Vitamin B-12: 411 pg/mL (ref 211–911)

## 2023-01-25 LAB — TSH: TSH: 0.82 u[IU]/mL (ref 0.35–5.50)

## 2023-01-25 MED ORDER — CYANOCOBALAMIN 1000 MCG/ML IJ SOLN
1000.0000 ug | Freq: Once | INTRAMUSCULAR | Status: AC
Start: 2023-01-25 — End: 2023-01-25
  Administered 2023-01-25: 1000 ug via INTRAMUSCULAR

## 2023-01-25 MED ORDER — KETOROLAC TROMETHAMINE 60 MG/2ML IM SOLN
60.0000 mg | Freq: Once | INTRAMUSCULAR | Status: AC
Start: 2023-01-25 — End: 2023-01-25
  Administered 2023-01-25: 60 mg via INTRAMUSCULAR

## 2023-01-25 MED ORDER — DOXEPIN HCL 10 MG PO CAPS: 10.0000 mg | ORAL_CAPSULE | Freq: Every day | ORAL | 1 refills | Status: DC

## 2023-01-25 NOTE — Assessment & Plan Note (Signed)
Chronic.  Getting monthly injections.  1000 mg IM given today

## 2023-01-25 NOTE — Assessment & Plan Note (Signed)
Chronic.  Will do doxepin 10mg  at hs.  ?some OSA, RLS, pain, anxiety, etc.  Sleep hygeine

## 2023-01-25 NOTE — Patient Instructions (Signed)

## 2023-01-25 NOTE — Progress Notes (Signed)
Subjective:     Patient ID: Laurie Roman, female    DOB: 1975/06/18, 47 y.o.   MRN: [id number]  Chief Complaint  Patient presents with   Hypertension    fmla    HPI  HTN-on amlodipine 10mg , losartan 100mg  and metoprolol 25mg  bid-ok.  Rare 150/100.  Hasn't done 24hr urine yet.  Pain makes worse.  When no pain, better.  Occ cheeks flush.  No CP.  Chronic palp.  No SOB. Can be dizzy 2.  Anxiety/depression-taking lexapro 20mg .  Buspar didn't help.  No SI.  Helps some but a lot going on in life.   3.  Insomnia-falling and staying-long time.  Sometimes from pain.  Usu only gets 3-4 hrs/night.  Took otc unisom-fell asleep but not stay. Snores some.  Pt tosses and turns all night.  No witnessed apneas.  Tired all day.  A lot of ha.  Occ morning ha.  Tired driving. Falls asleep as passenger, reading,watching TV.  Sitting down, will fall asleep.  Can nap if allowed.  Wakes up tired. Tizanidine can help go alseep but freq wakes at 2-3am(no trauma at that time). Worse if stressed. H/o sleep apnea->10 yrs ago-cpap wasn't working right and never corrected. Trazodone not work 4.  FMLA-needs paperwork.1-2 days/wk.   5.  R hip hurting a lot today.  Had MRI.  Needs referral to [facility] pain mgmt. Requesting toradol today.   6.. B12 def-needs to restart  There are no preventive care reminders to display for this patient.   Past Medical History:  Diagnosis Date   ADD (attention deficit disorder)    Anxiety    Asthma    Blood transfusion without reported diagnosis    Depression    GERD (gastroesophageal reflux disease)    IBS (irritable bowel syndrome)    Insomnia    Lumbago    Migraine    Opioid dependence (HCC) 08/21/2022   Explained 08/21/22 that 4 months hydrocodone for pain management for delays in dental care has resulted in hyperalgesia and dependence   Other chest pain 08/17/2022   Normal EKG and D-dimer  Associated with lifting on father Doesn't radiate  Nonexertional  Not  substernal chest pain more to the side, feels related to sleeping on it wrong (reproducible)   Sciatica    Sleep apnea    Tietze's disease    Urinary incontinence 10/26/2022    Past Surgical History:  Procedure Laterality Date   APPENDECTOMY     CESAREAN SECTION     TONSILLECTOMY     TOTAL ABDOMINAL HYSTERECTOMY W/ BILATERAL SALPINGOOPHORECTOMY     TUBAL LIGATION       Current Outpatient Medications:    albuterol (VENTOLIN HFA) 108 (90 Base) MCG/ACT inhaler, Inhale 2 puffs into the lungs every 6 (six) hours as needed for wheezing or shortness of breath., Disp: 18 g, Rfl: 3   amLODipine (NORVASC) 10 MG tablet, Take 1 tablet (10 mg total) by mouth daily. Replaces amlodipine 5, for uncontrolled blood pressure., Disp: 90 tablet, Rfl: 3   amphetamine-dextroamphetamine (ADDERALL) 20 MG tablet, Take 1 tablet (20 mg total) by mouth 2 (two) times daily., Disp: 60 tablet, Rfl: 0   budesonide-formoterol (SYMBICORT) 160-4.5 MCG/ACT inhaler, Inhale 2 puffs into the lungs 2 (two) times daily., Disp: 1 each, Rfl: 3   chlorhexidine (PERIDEX) 0.12 % solution, Use as directed 15 mLs in the mouth or throat 2 (two) times daily., Disp: 1893 mL, Rfl: 0   doxepin (SINEQUAN) 10 MG  capsule, Take 1 capsule (10 mg total) by mouth at bedtime., Disp: 30 capsule, Rfl: 1   Erenumab-aooe (AIMOVIG) 140 MG/ML SOAJ, Inject 140 mg into the skin every 30 (thirty) days., Disp: 3 mL, Rfl: 3   escitalopram (LEXAPRO) 20 MG tablet, Take 1 tablet (20 mg total) by mouth daily., Disp: 90 tablet, Rfl: 1   fluconazole (DIFLUCAN) 150 MG tablet, Take 1 tablet (150 mg total) by mouth every three (3) days as needed., Disp: 3 tablet, Rfl: 0   fluticasone (FLONASE) 50 MCG/ACT nasal spray, Place 1 spray into both nostrils daily., Disp: 16 g, Rfl: 0   gabapentin (NEURONTIN) 800 MG tablet, Take 1 tablet (800 mg total) by mouth 3 (three) times daily. Replaces prior dosing schedule., Disp: 90 tablet, Rfl: 2   HYDROcodone-acetaminophen (NORCO)  10-325 MG tablet, Take 1 tablet by mouth every 8 (eight) hours as needed., Disp: 60 tablet, Rfl: 0   ketorolac (TORADOL) 10 MG tablet, Take 1 tablet (10 mg total) by mouth every 6 (six) hours as needed., Disp: 20 tablet, Rfl: 0   losartan (COZAAR) 100 MG tablet, Take 1 tablet (100 mg total) by mouth daily., Disp: 90 tablet, Rfl: 1   metoprolol tartrate (LOPRESSOR) 25 MG tablet, Take 1 tablet (25 mg total) by mouth 2 (two) times daily., Disp: 180 tablet, Rfl: 0   nystatin (MYCOSTATIN) 100000 UNIT/ML suspension, Take 5 mLs (500,000 Units total) by mouth 4 (four) times daily. Swish and hold in mouth few seconds and swallow, Disp: 60 mL, Rfl: 0   nystatin (MYCOSTATIN/NYSTOP) powder, Apply 1 Application topically 3 (three) times daily., Disp: 60 g, Rfl: 11   nystatin cream (MYCOSTATIN), Apply 1 Application topically 2 (two) times daily., Disp: 30 g, Rfl: 0   nystatin cream (MYCOSTATIN), Apply 1 Application topically 2 (two) times daily., Disp: 30 g, Rfl: 5   ondansetron (ZOFRAN) 4 MG tablet, Take 1 tablet (4 mg total) by mouth every 8 (eight) hours as needed for nausea or vomiting., Disp: 20 tablet, Rfl: 0   pantoprazole (PROTONIX) 40 MG tablet, Take 1 tablet (40 mg total) by mouth daily., Disp: 90 tablet, Rfl: 0   rizatriptan (MAXALT) 10 MG tablet, Take 1 tablet (10 mg total) by mouth as needed for migraine. May repeat in 2 hours if needed, Disp: 10 tablet, Rfl: 3   tiZANidine (ZANAFLEX) 4 MG tablet, Take 1 tablet (4 mg total) by mouth every 6 (six) hours as needed for muscle spasms., Disp: 30 tablet, Rfl: 3  Current Facility-Administered Medications:    ondansetron (ZOFRAN-ODT) disintegrating tablet 4 mg, 4 mg, Oral, Once, [name], MD  Allergies  Allergen Reactions   Cefazolin Hives   Ondansetron Hcl Hives   Sulfa Antibiotics Rash   Tramadol Hcl     Other reaction(s): chest pain   Emgality [Galcanezumab-Gnlm]    Ultram [Tramadol] Other (See Comments)    Tachycardia   Oxycodone Rash    Sulfasalazine Rash   ROS neg/noncontributory except as noted HPI/below      Objective:     BP 136/82 (BP Location: Left Arm, Patient Position: Sitting, Cuff Size: Large)   Pulse (!) 103   Temp 98 F (36.7 C)   Ht 4\' 11"  (1.499 m)   Wt 146 lb 12.8 oz (66.6 kg)   SpO2 98%   BMI 29.65 kg/m  Wt Readings from Last 3 Encounters:  01/25/23 146 lb 12.8 oz (66.6 kg)  01/01/23 146 lb 9.6 oz (66.5 kg)  12/29/22 146 lb 9.6 oz (  66.5 kg)    Physical Exam   Gen: WDWN NAD HEENT: NCAT, conjunctiva not injected, sclera nonicteric NECK:  supple, no thyromegaly, no nodes, no carotid bruits CARDIAC: RRR, S1S2+, no murmur. DP 2+B LUNGS: CTAB. No wheezes ABDOMEN:  BS+, soft, NTND, No HSM, no masses EXT:  no edema MSK: no gross abnormalities.  NEURO: A&O x3.  CN II-XII intact.  PSYCH: normal mood. Good eye contact     Assessment & Plan:  Essential hypertension Assessment & Plan: Chronic.  Mostly controlled.  Still some spikes.  Will do 24hr urine soon.  May be some OSA as well.  Continue amlodipine 10mg , losartan 100mg , metoprolol 25mg  bid  Orders: -     Comprehensive metabolic panel -     Hemoglobin A1c -     Lipid panel -     TSH  Mild obstructive sleep apnea Assessment & Plan: Chronic.  Per pt, about 67yrs ago, cpap wasn't working properly so didn't use.  Will do referral as labile bp's, fatigue, etc.   Orders: -     Ambulatory referral to Neurology  Chronic bilateral low back pain with bilateral sciatica Assessment & Plan: Chronic.  Not ideal.  Gets toradol injections frequently.  MRI unremarkable.  On chronic opioids.  To pain mgmt  Orders: -     Ketorolac Tromethamine -     Ambulatory referral to Pain Clinic  Anxiety Assessment & Plan: Chronic.  Out of control.  A lot of stressors in life.  Continue Lexapro 20 mg daily.  Buspar ineffective.  Pt ok w/this for now.     Primary insomnia Assessment & Plan: Chronic.  Will do doxepin 10mg  at hs.  ?some OSA, RLS, pain,  anxiety, etc.  Sleep hygeine  Orders: -     Ambulatory referral to Neurology  B12 deficiency Assessment & Plan: Chronic.  Getting monthly injections.  1000 mg IM given today  Orders: -     CBC with Differential/Platelet -     Vitamin B12 -     Cyanocobalamin  Exposure to STD -     HSV(herpes simplex vrs) 1+2 ab-IgG -     RPR -     HepB+HepC+HIV Panel -     Cervicovaginal ancillary only  Other orders -     Doxepin HCl; Take 1 capsule (10 mg total) by mouth at bedtime.  Dispense: 30 capsule; Refill: 1    Return in about 3 months (around 04/27/2023) for HTN.  Angelena Sole, MD

## 2023-01-25 NOTE — Assessment & Plan Note (Signed)
Chronic.  Out of control.  A lot of stressors in life.  Continue Lexapro 20 mg daily.  Buspar ineffective.  Pt ok w/this for now.

## 2023-01-25 NOTE — Assessment & Plan Note (Signed)
Chronic.  Mostly controlled.  Still some spikes.  Will do 24hr urine soon.  May be some OSA as well.  Continue amlodipine 10mg , losartan 100mg , metoprolol 25mg  bid

## 2023-01-25 NOTE — Assessment & Plan Note (Signed)
Chronic.  Per pt, about 61yrs ago, cpap wasn't working properly so didn't use.  Will do referral as labile bp's, fatigue, etc.

## 2023-01-25 NOTE — Assessment & Plan Note (Signed)
Chronic.  Not ideal.  Gets toradol injections frequently.  MRI unremarkable.  On chronic opioids.  To pain mgmt

## 2023-01-26 LAB — CERVICOVAGINAL ANCILLARY ONLY
Bacterial Vaginitis (gardnerella): NEGATIVE
Candida Glabrata: NEGATIVE
Candida Vaginitis: POSITIVE — AB
Chlamydia: NEGATIVE
Comment: NEGATIVE
Comment: NEGATIVE
Comment: NEGATIVE
Comment: NEGATIVE
Comment: NEGATIVE
Comment: NORMAL
Neisseria Gonorrhea: NEGATIVE
Trichomonas: NEGATIVE

## 2023-01-26 LAB — HSV(HERPES SIMPLEX VRS) I + II AB-IGG
HSV 1 IGG,TYPE SPECIFIC AB: 27.6 {index} — ABNORMAL HIGH
HSV 2 IGG,TYPE SPECIFIC AB: <0.9 {index}

## 2023-01-26 LAB — HEPB+HEPC+HIV PANEL
HIV Screen 4th Generation wRfx: NONREACTIVE
Hep B C IgM: NEGATIVE
Hep B Core Total Ab: NEGATIVE
Hep B E Ab: NONREACTIVE
Hep B E Ag: NEGATIVE
Hep B Surface Ab, Qual: NONREACTIVE
Hep C Virus Ab: NONREACTIVE
Hepatitis B Surface Ag: NEGATIVE

## 2023-01-26 LAB — COMPREHENSIVE METABOLIC PANEL
ALT: 18 U/L (ref 0–35)
AST: 20 U/L (ref 0–37)
Albumin: 4.9 g/dL (ref 3.5–5.2)
Alkaline Phosphatase: 66 U/L (ref 39–117)
BUN: 14 mg/dL (ref 6–23)
CO2: 24 meq/L (ref 19–32)
Calcium: 9.8 mg/dL (ref 8.4–10.5)
Chloride: 102 meq/L (ref 96–112)
Creatinine, Ser: 0.62 mg/dL (ref 0.40–1.20)
GFR: 106.14 mL/min (ref >60.00)
Glucose, Bld: 153 mg/dL — ABNORMAL HIGH (ref 70–99)
Potassium: 3.4 meq/L — ABNORMAL LOW (ref 3.5–5.1)
Sodium: 140 meq/L (ref 135–145)
Total Bilirubin: 0.2 mg/dL (ref 0.2–1.2)
Total Protein: 7.9 g/dL (ref 6.0–8.3)

## 2023-01-26 LAB — RPR: RPR Ser Ql: NONREACTIVE

## 2023-01-26 LAB — LIPID PANEL
Cholesterol: 255 mg/dL — ABNORMAL HIGH (ref 0–200)
HDL: 81.5 mg/dL (ref >39.00)
NonHDL: 173.08
Total CHOL/HDL Ratio: 3
Triglycerides: 258 mg/dL — ABNORMAL HIGH (ref 0.0–149.0)
VLDL: 51.6 mg/dL — ABNORMAL HIGH (ref 0.0–40.0)

## 2023-01-26 LAB — LDL CHOLESTEROL, DIRECT: Direct LDL: 160 mg/dL

## 2023-01-27 ENCOUNTER — Telehealth (INDEPENDENT_AMBULATORY_CARE_PROVIDER_SITE_OTHER): Payer: Medicaid Other | Admitting: Internal Medicine

## 2023-01-27 ENCOUNTER — Encounter: Payer: Self-pay | Admitting: Internal Medicine

## 2023-01-27 VITALS — Ht 59.0 in | Wt 146.0 lb

## 2023-01-27 DIAGNOSIS — I10 Essential (primary) hypertension: Secondary | ICD-10-CM

## 2023-01-27 DIAGNOSIS — F419 Anxiety disorder, unspecified: Secondary | ICD-10-CM | POA: Diagnosis not present

## 2023-01-27 DIAGNOSIS — G8929 Other chronic pain: Secondary | ICD-10-CM

## 2023-01-27 DIAGNOSIS — M25551 Pain in right hip: Secondary | ICD-10-CM

## 2023-01-27 DIAGNOSIS — M5441 Lumbago with sciatica, right side: Secondary | ICD-10-CM | POA: Diagnosis not present

## 2023-01-27 DIAGNOSIS — F5101 Primary insomnia: Secondary | ICD-10-CM

## 2023-01-27 DIAGNOSIS — M5442 Lumbago with sciatica, left side: Secondary | ICD-10-CM

## 2023-01-27 DIAGNOSIS — K219 Gastro-esophageal reflux disease without esophagitis: Secondary | ICD-10-CM | POA: Diagnosis not present

## 2023-01-27 DIAGNOSIS — M5432 Sciatica, left side: Secondary | ICD-10-CM

## 2023-01-27 DIAGNOSIS — G43909 Migraine, unspecified, not intractable, without status migrainosus: Secondary | ICD-10-CM | POA: Diagnosis not present

## 2023-01-27 DIAGNOSIS — F902 Attention-deficit hyperactivity disorder, combined type: Secondary | ICD-10-CM | POA: Diagnosis not present

## 2023-01-27 MED ORDER — ARIPIPRAZOLE 2 MG PO TABS: 2.0000 mg | ORAL_TABLET | Freq: Every day | ORAL | 2 refills | Status: DC

## 2023-01-27 NOTE — Assessment & Plan Note (Signed)
Uncontrolled Generalized Anxiety Disorder They experience significant stress due to legal and financial issues, exacerbating their anxiety. Despite taking Lexapro 20mg  daily, their anxiety remains uncontrolled. We will add Abilify as an adjunct treatment for anxiety. If insurance does not cover Abilify, Seroquel will be considered as an alternative.

## 2023-01-27 NOTE — Assessment & Plan Note (Signed)
For GERD, they are currently on proton pump inhibitor (PPI) stomach acid reducer  40mg . We will continue this medication.

## 2023-01-27 NOTE — Progress Notes (Signed)
[facility]: [phone]   Virtual Medical Office Visit - Video Telemedicine   Patient:  Laurie Roman (1975-07-05) located in parked car MRN:   [id number]      Date:   01/27/2023  PCP:    Jeani Sow, MD   Today's Healthcare Provider: [name], MD located at office: [facility] at [facility] [address] [place] [address] Today's Telemedicine visit was conducted via Video for 21m 52s after consent for telemedicine was obtained:  Video connection was never lost All video encounter participant identities and locations confirmed visually and verbally.   Assessment and Plan:     Assessment & Plan Anxiety Uncontrolled Generalized Anxiety Disorder They experience significant stress due to legal and financial issues, exacerbating their anxiety. Despite taking Lexapro 20mg  daily, their anxiety remains uncontrolled. We will add Abilify as an adjunct treatment for anxiety. If insurance does not cover Abilify, Seroquel will be considered as an alternative. Left sided sciatica  Chronic right hip pain Chronic Right Hip Pain Their chronic right hip pain, likely secondary to arthritis and worsened by physical activity, is not adequately relieved by Hydrocodone 10mg . We will refer them to Sports Medicine for further evaluation and management. After their current Hydrocodone prescription runs out around September 1st, Butrans patches will be considered for pain management, with further discussion at their September 8th appointment.  Essential hypertension Hypertension They are currently managed on Losartan 100mg  and Amlodipine 10mg  daily for hypertension. We will continue these medications.  This is video visit and she wants to focus on managing anxiety which she feels is causing blood pressure to be uncontrolled  Migraine without status migrainosus, not intractable, unspecified migraine type Migraines Despite using Maxalt as needed, a  recent migraine episode was not adequately relieved. We will continue the current medication and monitor their response. Primary insomnia They recently were given but have yet to start Doxepin for insomnia. We will continue this medication and monitor their response.  This might impact their anxiety management positively as well. Attention deficit hyperactivity disorder (ADHD), combined type Discourage starting Adderall until anxiety is under control, might cause severe anxiety stress reaction. Gastroesophageal reflux disease without esophagitis For GERD, they are currently on proton pump inhibitor (PPI) stomach acid reducer  40mg . We will continue this medication. Chronic bilateral low back pain with bilateral sciatica Chronic Back Pain Their chronic back pain, likely contributing to their hip pain, is currently managed with Gabapentin 800mg  three times a day. We will continue this medication and advise applying muscle rub to the lower back and hip as needed. Pending Pain Management Referral They are awaiting an appointment with a pain management specialist. We will follow up on the status of this referral.  This was reordered earlier this week by  Primary Care Provider (PCP) who also has been using hydrocodone to bridge to pain management (specialist) which has been delayed. She reports she wants to try Butrans patch but Primary Care Provider (PCP) defers to me doesn't use butrans. Coordinated with Primary Care Provider (PCP).  We need to discuss how this might look. She is on extensive medication(s) regiment (gabapentin/hydrocodone/tizanidine/topical rubs) with proton pump inhibitor (PPI) stomach acid reducer dependent gastroesophageal reflux disease limiting NSAIDs. (No Celebrex due to sulfa allergy)  there aren't a lot of great options for her severe pain which is being flared by working on moving out of house with bad back  Hope to temporize and augment her pain management (specialist) plan for  back by getting sports medicine input. Referred today.   ED Discharge Orders          Ordered    ARIPiprazole (ABILIFY) 2 MG tablet  Daily        01/27/23 1327    Ambulatory referral to Sports Medicine       Comments: Flaring back and hip pain, having to pack up her house to move. On a lot of medications already that aren't controlling the pain. Assist with pain management. Existing pain management referral on delay for scheduling.   01/27/23 1327                 Clinical Presentation:    47 y.o. female who has Migraine; ADHD; Essential hypertension; Mild obstructive sleep apnea; Controlled substance agreement signed; Gastroesophageal reflux disease without esophagitis; Left sided sciatica; Asthma; Maxillary sinusitis; Anxiety; Primary insomnia; Depression, major, single episode, severe (HCC); Cervicalgia; Chronic dental pain; Opioid dependence (HCC); B12 deficiency; Urinary incontinence; Flank pain; Uric acid crystalluria; Right sided sciatica; Intertrigo; Chronic bilateral low back pain with sciatica; and Chronic right hip pain on their problem list. Her reasons/main concerns/chief complaints for today's office visit are Anxiety, Hypertension, and Headache   AI-Extracted: Discussed the use of AI scribe software for clinical note transcription with the patient, who gave verbal consent to proceed.  History of Present Illness   The patient, with a history of anxiety, hypertension, and chronic hip pain, presents with escalating anxiety symptoms. They believe their anxiety is the primary issue, attributing their elevated blood pressure and headaches to this condition. The patient is currently on Lexapro for anxiety management but reports it is no longer effective due to increased stress levels. They have previously been on Buspar, but are no longer taking it.  The patient's anxiety is exacerbated by multiple stressors, including an upcoming court date related to a housing dispute, financial  stress, and chronic hip pain. The patient's hip pain has been diagnosed as arthritis, but it has been flaring up recently due to increased physical activity related to packing their belongings in anticipation of a potential move. The patient is currently taking hydrocodone for the hip pain, but reports it is not as effective as it once was.  The patient also reports sleep disturbances and has recently been prescribed Doxepin. They have a history of migraines and are currently taking Maxalt, but report it was not effective for their most recent headache. The patient is also on a regimen of Adderall, losartan, amlodipine, Tonics, Diflucan, gabapentin, albuterol, Zofran, and metoprolol. They are also receiving monthly Aimavik injections for headache management.  They have expressed interest in potentially switching to a Butrans patch for pain management, but this has not yet been initiated. The patient denies any caffeine intake and reports no recent changes in their medication regimen.      Financial/legal/physical hip pain stressors, consistently taking Lexapro. Off buspar. Not yet started Attention Deficit Hyperactivity Disorder (ADHD) medications or sleeping pill doxepin.   She  has a past medical history of ADD (attention deficit disorder), Anxiety, Asthma, Blood transfusion without reported diagnosis, Depression, GERD (gastroesophageal reflux disease), IBS (irritable bowel syndrome), Insomnia, Lumbago, Migraine, Opioid dependence (HCC) (08/21/2022), Other chest pain (08/17/2022), Sciatica, Sleep apnea, Tietze's disease, and Urinary incontinence (10/26/2022).  Problem overviews that were updated today: Problem  Chronic Right Hip Pain    Current Outpatient Medications on File Prior to Visit  Medication Sig   albuterol (VENTOLIN HFA) 108 (90 Base) MCG/ACT inhaler Inhale 2 puffs into the  lungs every 6 (six) hours as needed for wheezing or shortness of breath.   amLODipine (NORVASC) 10 MG tablet  Take 1 tablet (10 mg total) by mouth daily. Replaces amlodipine 5, for uncontrolled blood pressure.   amphetamine-dextroamphetamine (ADDERALL) 20 MG tablet Take 1 tablet (20 mg total) by mouth 2 (two) times daily.   budesonide-formoterol (SYMBICORT) 160-4.5 MCG/ACT inhaler Inhale 2 puffs into the lungs 2 (two) times daily.   chlorhexidine (PERIDEX) 0.12 % solution Use as directed 15 mLs in the mouth or throat 2 (two) times daily.   doxepin (SINEQUAN) 10 MG capsule Take 1 capsule (10 mg total) by mouth at bedtime.   Erenumab-aooe (AIMOVIG) 140 MG/ML SOAJ Inject 140 mg into the skin every 30 (thirty) days.   escitalopram (LEXAPRO) 20 MG tablet Take 1 tablet (20 mg total) by mouth daily.   fluticasone (FLONASE) 50 MCG/ACT nasal spray Place 1 spray into both nostrils daily.   gabapentin (NEURONTIN) 800 MG tablet Take 1 tablet (800 mg total) by mouth 3 (three) times daily. Replaces prior dosing schedule.   HYDROcodone-acetaminophen (NORCO) 10-325 MG tablet Take 1 tablet by mouth every 8 (eight) hours as needed.   losartan (COZAAR) 100 MG tablet Take 1 tablet (100 mg total) by mouth daily.   metoprolol tartrate (LOPRESSOR) 25 MG tablet Take 1 tablet (25 mg total) by mouth 2 (two) times daily.   nystatin (MYCOSTATIN) 100000 UNIT/ML suspension Take 5 mLs (500,000 Units total) by mouth 4 (four) times daily. Swish and hold in mouth few seconds and swallow   nystatin (MYCOSTATIN/NYSTOP) powder Apply 1 Application topically 3 (three) times daily.   nystatin cream (MYCOSTATIN) Apply 1 Application topically 2 (two) times daily.   nystatin cream (MYCOSTATIN) Apply 1 Application topically 2 (two) times daily.   ondansetron (ZOFRAN) 4 MG tablet Take 1 tablet (4 mg total) by mouth every 8 (eight) hours as needed for nausea or vomiting.   pantoprazole (PROTONIX) 40 MG tablet Take 1 tablet (40 mg total) by mouth daily.   rizatriptan (MAXALT) 10 MG tablet Take 1 tablet (10 mg total) by mouth as needed for migraine. May  repeat in 2 hours if needed   tiZANidine (ZANAFLEX) 4 MG tablet Take 1 tablet (4 mg total) by mouth every 6 (six) hours as needed for muscle spasms.   Current Facility-Administered Medications on File Prior to Visit  Medication   ondansetron (ZOFRAN-ODT) disintegrating tablet 4 mg   Medications Discontinued During This Encounter  Medication Reason   ketorolac (TORADOL) 10 MG tablet Completed Course   fluconazole (DIFLUCAN) 150 MG tablet Completed Course       Clinical Data Analysis:   Physical Exam  Ht 4\' 11"  (1.499 m)   Wt 146 lb (66.2 kg)   BMI 29.49 kg/m  Wt Readings from Last 10 Encounters:  01/27/23 146 lb (66.2 kg)  01/25/23 146 lb 12.8 oz (66.6 kg)  01/01/23 146 lb 9.6 oz (66.5 kg)  12/29/22 146 lb 9.6 oz (66.5 kg)  12/14/22 143 lb 2 oz (64.9 kg)  11/04/22 140 lb 2 oz (63.6 kg)  10/05/22 137 lb 6 oz (62.3 kg)  08/26/22 144 lb (65.3 kg)  08/21/22 140 lb (63.5 kg)  08/17/22 138 lb (62.6 kg)  Vital signs reviewed.  Nursing notes reviewed. Weight trend reviewed. Abnormalities and Problem-Specific physical exam findings:  wearing sunglasses in car (presumably for migraine headache(s))   General Appearance:  No acute distress appreciable.   Well-groomed, healthy-appearing female.  Well proportioned with no abnormal fat  distribution.  Good muscle tone. Pulmonary:  Normal work of breathing at rest, no respiratory distress apparent.   \ Neurological:  Awake, alert, oriented, and engaged.  No obvious focal neurological deficits or cognitive impairments.  Sensorium seems unclouded.   Speech is clear and coherent with logical content. Psychiatric:  Appropriate mood, pleasant and cooperative demeanor, thoughtful and engaged during the exam  Results Reviewed:     No results found for any visits on 01/27/23.  Office Visit on 01/25/2023  Component Date Value   WBC 01/25/2023 11.8 (H)    RBC 01/25/2023 4.02    Hemoglobin 01/25/2023 12.5    HCT 01/25/2023 37.6    MCV 01/25/2023  93.4    MCHC 01/25/2023 33.2    RDW 01/25/2023 13.8    Platelets 01/25/2023 375.0    Neutrophils Relative % 01/25/2023 64.2    Lymphocytes Relative 01/25/2023 27.2    Monocytes Relative 01/25/2023 6.8    Eosinophils Relative 01/25/2023 1.1    Basophils Relative 01/25/2023 0.7    Neutro Abs 01/25/2023 7.6    Lymphs Abs 01/25/2023 3.2    Monocytes Absolute 01/25/2023 0.8    Eosinophils Absolute 01/25/2023 0.1    Basophils Absolute 01/25/2023 0.1    Sodium 01/25/2023 140    Potassium 01/25/2023 3.4 (L)    Chloride 01/25/2023 102    CO2 01/25/2023 24    Glucose, Bld 01/25/2023 153 (H)    BUN 01/25/2023 14    Creatinine, Ser 01/25/2023 0.62    Total Bilirubin 01/25/2023 0.2    Alkaline Phosphatase 01/25/2023 66    AST 01/25/2023 20    ALT 01/25/2023 18    Total Protein 01/25/2023 7.9    Albumin 01/25/2023 4.9    GFR 01/25/2023 106.14    Calcium 01/25/2023 9.8    Hgb A1c MFr Bld 01/25/2023 5.8    Vitamin B-12 01/25/2023 411    HAV 1 IGG,TYPE SPECIFIC * 01/25/2023 27.60 (H)    HSV 2 IGG,TYPE SPECIFIC * 01/25/2023 <0.90    RPR Ser Ql 01/25/2023 NON-REACTIVE    Hepatitis B Surface Ag 01/25/2023 Negative    Hep B E Ag 01/25/2023 Negative    Hep B C IgM 01/25/2023 Negative    Hep B Core Total Ab 01/25/2023 Negative    Hep B E Ab 01/25/2023 Non Reactive    Hep B Surface Ab, Qual 01/25/2023 Non Reactive    Hep C Virus Ab 01/25/2023 Non Reactive    HIV Screen 4th Generatio* 01/25/2023 Non Reactive    Neisseria Gonorrhea 01/25/2023 Negative    Chlamydia 01/25/2023 Negative    Trichomonas 01/25/2023 Negative    Bacterial Vaginitis (gar* 01/25/2023 Negative    Candida Vaginitis 01/25/2023 Positive (A)    Candida Glabrata 01/25/2023 Negative    Comment 01/25/2023 Normal Reference Range Candida Species - Negative    Comment 01/25/2023 Normal Reference Range Candida Galbrata - Negative    Comment 01/25/2023 Normal Reference Range Trichomonas - Negative    Comment 01/25/2023 Normal  Reference Ranger Chlamydia - Negative    Comment 01/25/2023 Normal Reference Range Neisseria Gonorrhea - Negative    Comment 01/25/2023 Normal Reference Range Bacterial Vaginosis - Negative    Cholesterol 01/25/2023 255 (H)    Triglycerides 01/25/2023 258.0 (H)    HDL 01/25/2023 81.50    VLDL 01/25/2023 51.6 (H)    Total CHOL/HDL Ratio 01/25/2023 3    NonHDL 01/25/2023 173.08    TSH 01/25/2023 0.82    Direct LDL 01/25/2023 160.0   Office Visit on  12/29/2022  Component Date Value   Color, UA 12/29/2022 yellow    Clarity, UA 12/29/2022 clear    Glucose, UA 12/29/2022 Negative    Bilirubin, UA 12/29/2022 Negative    Ketones, UA 12/29/2022 Negative    Spec Grav, UA 12/29/2022 1.020    Blood, UA 12/29/2022 Negative    pH, UA 12/29/2022 6.0    Protein, UA 12/29/2022 Negative    Urobilinogen, UA 12/29/2022 0.2    Nitrite, UA 12/29/2022 Negative    Leukocytes, UA 12/29/2022 Negative   Video Visit on 11/11/2022  Component Date Value   Neisseria Gonorrhea 11/11/2022 Negative    Chlamydia 11/11/2022 Negative    Trichomonas 11/11/2022 Negative    Bacterial Vaginitis (gar* 11/11/2022 Positive (A)    Candida Vaginitis 11/11/2022 Negative    Candida Glabrata 11/11/2022 Negative    Comment 11/11/2022 Normal Reference Range Bacterial Vaginosis - Negative    Comment 11/11/2022 Normal Reference Range Candida Species - Negative    Comment 11/11/2022 Normal Reference Range Candida Galbrata - Negative    Comment 11/11/2022 Normal Reference Range Trichomonas - Negative    Comment 11/11/2022 Normal Reference Ranger Chlamydia - Negative    Comment 11/11/2022 Normal Reference Range Neisseria Gonorrhea - Negative   Office Visit on 10/26/2022  Component Date Value   Color, UA 10/26/2022 yellow    Clarity, UA 10/26/2022 clear    Glucose, UA 10/26/2022 Negative    Bilirubin, UA 10/26/2022 negative    Ketones, UA 10/26/2022 positive    Spec Grav, UA 10/26/2022 >=1.030 (A)    Blood, UA 10/26/2022  negative    pH, UA 10/26/2022 5.0    Protein, UA 10/26/2022 Negative    Urobilinogen, UA 10/26/2022 0.2    Nitrite, UA 10/26/2022 negative    Leukocytes, UA 10/26/2022 Trace (A)    MICRO NUMBER: 10/26/2022 [id number]    SPECIMEN QUALITY: 10/26/2022 Adequate    Sample Source 10/26/2022 URINE    STATUS: 10/26/2022 FINAL    Result: 10/26/2022 No Growth    Neisseria Gonorrhea 10/26/2022 Negative    Chlamydia 10/26/2022 Negative    Trichomonas 10/26/2022 Negative    Bacterial Vaginitis-Urine 10/26/2022 Negative    Candida Urine 10/26/2022 Negative    Molecular Comment 10/26/2022 For tests bacteria and/or candida, this specimen does not meet the    Molecular Comment 10/26/2022 strict criteria set by the FDA. The result interpretation should be    Molecular Comment 10/26/2022 considered in conjunction with the patient's clinical history.    Comment 10/26/2022 Normal Reference Ranger Chlamydia - Negative    Comment 10/26/2022 Normal Reference Range Neisseria Gonorrhea - Negative    Comment 10/26/2022 Normal Reference Range Trichomonas - Negative    WBC, UA 10/26/2022 0-2/hpf    RBC / HPF 10/26/2022 none seen    Squamous Epithelial / HPF 10/26/2022 Rare(0-4/hpf)    Uric Acid Crys, UA 10/26/2022 Presence of (A)    Amorphous 10/26/2022 Present (A)   Office Visit on 08/17/2022  Component Date Value   Vitamin B-12 08/18/2022 592    Glucose, Bld 08/18/2022 84    BUN 08/18/2022 19    Creat 08/18/2022 0.59    BUN/Creatinine Ratio 08/18/2022 SEE NOTE:    Sodium 08/18/2022 141    Potassium 08/18/2022 3.7    Chloride 08/18/2022 105    CO2 08/18/2022 23    Calcium 08/18/2022 9.8    Total Protein 08/18/2022 7.7    Albumin 08/18/2022 5.0    Globulin 08/18/2022 2.7    AG Ratio 08/18/2022 1.9  Total Bilirubin 08/18/2022 0.3    Alkaline phosphatase (AP* 08/18/2022 77    AST 08/18/2022 17    ALT 08/18/2022 11    WBC 08/18/2022 9.3    RBC 08/18/2022 4.01    Hemoglobin 08/18/2022 12.2    HCT  08/18/2022 35.4    MCV 08/18/2022 88.3    MCH 08/18/2022 30.4    MCHC 08/18/2022 34.5    RDW 08/18/2022 12.5    Platelets 08/18/2022 314    MPV 08/18/2022 11.3    Neutro Abs 08/18/2022 4,371    Lymphs Abs 08/18/2022 3,906 (H)    Absolute Monocytes 08/18/2022 688    Eosinophils Absolute 08/18/2022 233    Basophils Absolute 08/18/2022 102    Neutrophils Relative % 08/18/2022 47    Total Lymphocyte 08/18/2022 42.0    Monocytes Relative 08/18/2022 7.4    Eosinophils Relative 08/18/2022 2.5    Basophils Relative 08/18/2022 1.1    D-Dimer, Quant 08/18/2022 0.22   Office Visit on 08/06/2022  Component Date Value   Color, UA 08/06/2022 cloudy    Clarity, UA 08/06/2022 yellow    Glucose, UA 08/06/2022 Negative    Bilirubin, UA 08/06/2022 Negative    Ketones, UA 08/06/2022 Negative    Spec Grav, UA 08/06/2022 1.025    Blood, UA 08/06/2022 3+    pH, UA 08/06/2022 6.0    Protein, UA 08/06/2022 Positive (A)    Urobilinogen, UA 08/06/2022 0.2    Nitrite, UA 08/06/2022 Negative    Leukocytes, UA 08/06/2022 Moderate (2+) (A)   Office Visit on 08/04/2022  Component Date Value   Color, UA 08/04/2022 YELLOW    Glucose, UA 08/04/2022 Negative    Bilirubin, UA 08/04/2022 NEG    Ketones, UA 08/04/2022 NEG    Spec Grav, UA 08/04/2022 1.025    Blood, UA 08/04/2022 POSITIVE    pH, UA 08/04/2022 6.0    Protein, UA 08/04/2022 Positive (A)    Urobilinogen, UA 08/04/2022 0.2    Nitrite, UA 08/04/2022 NEG    Leukocytes, UA 08/04/2022 Moderate (2+) (A)    MICRO NUMBER: 08/05/2022 [id number]    SPECIMEN QUALITY: 08/05/2022 Adequate    Sample Source 08/05/2022 URINE    STATUS: 08/05/2022 FINAL    ISOLATE 1: 08/05/2022 ESBL Escherichia coli (A)   Office Visit on 07/15/2022  Component Date Value   SARS Coronavirus 2 Ag 07/15/2022 Negative    Influenza A, POC 07/15/2022 Negative    Influenza B, POC 07/15/2022 Negative   Lab on 05/13/2022  Component Date Value   Neisseria Gonorrhea 05/13/2022  Negative    Chlamydia 05/13/2022 Negative    Trichomonas 05/13/2022 Negative    Bacterial Vaginitis-Urine 05/13/2022 Negative    Candida Urine 05/13/2022 Positive (A)    Candida Urine 05/13/2022 Negative (A)    Molecular Comment 05/13/2022 For tests bacteria and/or candida, this specimen does not meet the    Molecular Comment 05/13/2022 strict criteria set by the FDA. The result interpretation should be    Molecular Comment 05/13/2022 considered in conjunction with the patient's clinical history.    Comment 05/13/2022 Normal Reference Ranger Chlamydia - Negative    Comment 05/13/2022 Normal Reference Range Neisseria Gonorrhea - Negative    Comment 05/13/2022 Normal Reference Range Trichomonas - Negative    WBC 05/13/2022 12.1 (H)    RBC 05/13/2022 4.04    Hemoglobin 05/13/2022 12.6    HCT 05/13/2022 37.2    MCV 05/13/2022 92.2    MCHC 05/13/2022 33.8    RDW 05/13/2022 13.7  Platelets 05/13/2022 398.0    Neutrophils Relative % 05/13/2022 63.9    Lymphocytes Relative 05/13/2022 24.9    Monocytes Relative 05/13/2022 7.9    Eosinophils Relative 05/13/2022 2.2    Basophils Relative 05/13/2022 1.1    Neutro Abs 05/13/2022 7.7    Lymphs Abs 05/13/2022 3.0    Monocytes Absolute 05/13/2022 1.0    Eosinophils Absolute 05/13/2022 0.3    Basophils Absolute 05/13/2022 0.1    Vitamin B-12 05/13/2022 236    Iron 05/13/2022 63    TIBC 05/13/2022 374    %SAT 05/13/2022 17    Ferritin 05/13/2022 48   Office Visit on 04/10/2022  Component Date Value   WBC 04/10/2022 9.2    RBC 04/10/2022 3.79 (L)    Hemoglobin 04/10/2022 11.5 (L)    HCT 04/10/2022 35.1    MCV 04/10/2022 92.6    MCH 04/10/2022 30.3    MCHC 04/10/2022 32.8    RDW 04/10/2022 11.9    Platelets 04/10/2022 334    MPV 04/10/2022 11.4    Neutro Abs 04/10/2022 4,122    Lymphs Abs 04/10/2022 3,892    Absolute Monocytes 04/10/2022 764    Eosinophils Absolute 04/10/2022 331    Basophils Absolute 04/10/2022 92    Neutrophils  Relative % 04/10/2022 44.8    Total Lymphocyte 04/10/2022 42.3    Monocytes Relative 04/10/2022 8.3    Eosinophils Relative 04/10/2022 3.6    Basophils Relative 04/10/2022 1.0    TSH 04/10/2022 1.10    Glucose, Bld 04/10/2022 89    BUN 04/10/2022 15    Creat 04/10/2022 0.69    BUN/Creatinine Ratio 04/10/2022 SEE NOTE:    Sodium 04/10/2022 141    Potassium 04/10/2022 4.9    Chloride 04/10/2022 102    CO2 04/10/2022 28    Calcium 04/10/2022 9.4    Total Protein 04/10/2022 7.0    Albumin 04/10/2022 4.3    Globulin 04/10/2022 2.7    AG Ratio 04/10/2022 1.6    Total Bilirubin 04/10/2022 0.2    Alkaline phosphatase (AP* 04/10/2022 62    AST 04/10/2022 16    ALT 04/10/2022 10    Cholesterol 04/10/2022 235 (H)    HDL 04/10/2022 76    Triglycerides 04/10/2022 95    LDL Cholesterol (Calc) 04/10/2022 139 (H)    Total CHOL/HDL Ratio 04/10/2022 3.1    Non-HDL Cholesterol (Cal* 04/10/2022 159 (H)    Hgb A1c MFr Bld 04/10/2022 5.6    Mean Plasma Glucose 04/10/2022 114    eAG (mmol/L) 04/10/2022 6.3    Iron 04/10/2022 55    TIBC 04/10/2022 384    %SAT 04/10/2022 14 (L)    Vitamin B-12 04/10/2022 379    TEST NAME: 04/10/2022 IRON AND TOTAL IRON VITAMIN B    TEST CODE: 04/10/2022 [id number]    CLIENT CONTACT: 04/10/2022 KATIE NELSON    REPORT ALWAYS MESSAGE SI* 04/10/2022    There may be more visits with results that are not included.   No image results found.   MR Lumbar Spine Wo Contrast  Result Date: 01/15/2023 CLINICAL DATA:  Low back pain radiating to the right hip and leg for the past 4 months. No injury or prior surgery. EXAM: MRI LUMBAR SPINE WITHOUT CONTRAST TECHNIQUE: Multiplanar, multisequence MR imaging of the lumbar spine was performed. No intravenous contrast was administered. COMPARISON:  MRI lumbar spine dated November 15, 2018. FINDINGS: Segmentation:  Standard. Alignment: Mild dextrocurvature of the upper lumbar spine. No listhesis. Vertebrae:  No fracture, evidence of  discitis, or bone lesion. Conus medullaris and cauda equina: Conus extends to the L1-L2 level. Conus and cauda equina appear normal. 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 Otherwise negative. Disc levels: T12-L1 to L3-L4:  Negative. L4-L5: Negative disc. Progressive mild bilateral facet arthropathy. No stenosis. L5-S1: Negative disc. Progressive mild bilateral facet arthropathy. No stenosis. IMPRESSION: 1. Progressive mild bilateral facet arthropathy at L4-L5 and L5-S1. No stenosis or impingement. Electronically Signed   By: Obie Dredge M.D.   On: 01/15/2023 08:34   DG Nasal Bones  Result Date: 11/09/2022 CLINICAL DATA:  Trauma, pain EXAM: NASAL BONES - 3+ VIEW COMPARISON:  None Available. FINDINGS: No displaced fracture is seen. Visualized paranasal sinuses are clear. There is no deviation of nasal septum. Hyperostosis frontalis interna is seen. IMPRESSION: No displaced fracture is seen in the nasal bones. Visualized paranasal sinuses are clear. Electronically Signed   By: Ernie Avena M.D.   On: 11/09/2022 08:40    No results found.    This encounter employed real-time, collaborative documentation. The patient actively reviewed and updated their medical record on a shared screen, ensuring transparency and facilitating joint problem-solving for the problem list, overview, and plan. This approach promotes accurate, informed care. The treatment plan was discussed and reviewed in detail, including medication safety, potential side effects, and all patient questions. We confirmed understanding and comfort with the plan. Follow-up instructions were established, including contacting the office for any concerns, returning if symptoms worsen, persist, or new symptoms develop, and precautions for potential emergency department visits. ----------------------------------------------------- [name], MD  01/27/2023 6:49 PM  [facility]:  [phone]

## 2023-01-27 NOTE — Patient Instructions (Addendum)
VISIT SUMMARY:  During our visit, we discussed your ongoing issues with anxiety, hip pain, hypertension, migraines, insomnia, ADHD, GERD, and chronic back pain. We also talked about your interest in potentially switching to a Butrans patch for pain management.  YOUR PLAN:  -ANXIETY: Your anxiety is not well-controlled with your current medication, Lexapro. We will add Abilify to help manage your anxiety. If your insurance does not cover Abilify, we may consider Seroquel as an alternative.  -HIP PAIN: Your hip pain, likely due to arthritis, is not being effectively managed by Hydrocodone. We will refer you to Sports Medicine for further evaluation. We will also consider using Butrans patches for pain management after your current Hydrocodone prescription runs out.  -HYPERTENSION: We will  continue your current medications, Losartan and Amlodipine, for managing your high blood pressure. Please monitor it and follow up with Primary Care Provider (PCP) soon  -MIGRAINES: Your recent migraine was not effectively managed by Maxalt. I think this is because blood pressure headaches from anxiety aren't migraines.  Better treatment of anxiety should fix this issue.  -INSOMNIA: You recently received Doxepin for insomnia. We will continue this medication and monitor your response.  -ADHD: You were recently prescribed Adderall for ADHD but have not started it yet. We advise you to start taking this medication as prescribed.  -GERD: We will continue your current medication, Tonics, for managing your GERD.  -CHRONIC BACK PAIN: Your chronic back pain, which may be contributing to your hip pain, is currently managed with Gabapentin. We will continue this medication and advise you to apply muscle rub to your lower back and hip as needed.  -PAIN MANAGEMENT REFERRAL: You are awaiting an appointment with a pain management specialist. We will follow up on the status of this referral.  INSTRUCTIONS:  Please start  taking your prescribed Adderall for ADHD. Continue taking your other medications as prescribed. Apply muscle rub to your lower back and hip as needed for pain. We will follow up on your referral to a pain management specialist. We will also discuss the potential use of Butrans patches for pain management at your next appointment on September 1st, I will discuss how to coordinate with Dr. Ruthine Dose.

## 2023-01-27 NOTE — Assessment & Plan Note (Signed)
Discourage starting Adderall until anxiety is under control, might cause severe anxiety stress reaction.

## 2023-01-27 NOTE — Assessment & Plan Note (Signed)
Hypertension They are currently managed on Losartan 100mg  and Amlodipine 10mg  daily for hypertension. We will continue these medications.  This is video visit and she wants to focus on managing anxiety which she feels is causing blood pressure to be uncontrolled

## 2023-01-27 NOTE — Assessment & Plan Note (Signed)
Migraines Despite using Maxalt as needed, a recent migraine episode was not adequately relieved. We will continue the current medication and monitor their response.

## 2023-01-27 NOTE — Assessment & Plan Note (Signed)
Chronic Right Hip Pain Their chronic right hip pain, likely secondary to arthritis and worsened by physical activity, is not adequately relieved by Hydrocodone 10mg . We will refer them to Sports Medicine for further evaluation and management. After their current Hydrocodone prescription runs out around September 1st, Butrans patches will be considered for pain management, with further discussion at their September 8th appointment.

## 2023-01-27 NOTE — Assessment & Plan Note (Signed)
Chronic Back Pain Their chronic back pain, likely contributing to their hip pain, is currently managed with Gabapentin 800mg  three times a day. We will continue this medication and advise applying muscle rub to the lower back and hip as needed. Pending Pain Management Referral They are awaiting an appointment with a pain management specialist. We will follow up on the status of this referral.  This was reordered earlier this week by  Primary Care Provider (PCP) who also has been using hydrocodone to bridge to pain management (specialist) which has been delayed. She reports she wants to try Butrans patch but Primary Care Provider (PCP) defers to me doesn't use butrans. Coordinated with Primary Care Provider (PCP).  We need to discuss how this might look. She is on extensive medication(s) regiment (gabapentin/hydrocodone/tizanidine/topical rubs) with proton pump inhibitor (PPI) stomach acid reducer dependent gastroesophageal reflux disease limiting NSAIDs. (No Celebrex due to sulfa allergy)  there aren't a lot of great options for her severe pain which is being flared by working on moving out of house with bad back  Hope to temporize and augment her pain management (specialist) plan for back by getting sports medicine input. Referred today.

## 2023-01-27 NOTE — Assessment & Plan Note (Signed)
They recently were given but have yet to start Doxepin for insomnia. We will continue this medication and monitor their response.  This might impact their anxiety management positively as well.

## 2023-01-28 ENCOUNTER — Other Ambulatory Visit: Payer: Self-pay | Admitting: Family Medicine

## 2023-01-28 ENCOUNTER — Other Ambulatory Visit: Payer: Self-pay

## 2023-01-28 DIAGNOSIS — F419 Anxiety disorder, unspecified: Secondary | ICD-10-CM

## 2023-01-28 DIAGNOSIS — Z139 Encounter for screening, unspecified: Secondary | ICD-10-CM

## 2023-01-28 MED ORDER — POTASSIUM CHLORIDE CRYS ER 20 MEQ PO TBCR: 20.0000 meq | EXTENDED_RELEASE_TABLET | Freq: Every day | ORAL | 0 refills | Status: DC

## 2023-01-28 MED ORDER — ARIPIPRAZOLE 2 MG PO TABS
2.0000 mg | ORAL_TABLET | Freq: Every day | ORAL | 2 refills | Status: DC
Start: 2023-01-28 — End: 2023-02-22

## 2023-01-28 MED ORDER — FLUCONAZOLE 150 MG PO TABS: 150.0000 mg | ORAL_TABLET | ORAL | 0 refills | Status: DC | PRN

## 2023-01-28 NOTE — Progress Notes (Signed)
Labs d/w pt.  HSV 1-cold sores Yeast-diflucan K low-Kcl daily Your cholesterol levels are elevated.  Work on low cholesterol and lower carbs/sugars diet and  get exercise to try to lower your cholesterol.  A1C(3 month average of sugars) is elevated.  This is considered PreDiabetes.  Work on diet-decrease sugars and starches and aim for 30 minutes of exercise 5 days/week to prevent progression to diabetes

## 2023-01-28 NOTE — Progress Notes (Signed)
Pt is in foreclosure.  Deceased husb(sev yrs), loan in his name and can't get it out of his name and issues w/mortgage company

## 2023-01-29 ENCOUNTER — Other Ambulatory Visit: Payer: Self-pay | Admitting: Family Medicine

## 2023-01-29 ENCOUNTER — Other Ambulatory Visit (HOSPITAL_COMMUNITY): Payer: Self-pay

## 2023-01-29 ENCOUNTER — Telehealth: Payer: Self-pay

## 2023-01-29 DIAGNOSIS — K219 Gastro-esophageal reflux disease without esophagitis: Secondary | ICD-10-CM

## 2023-01-29 DIAGNOSIS — M5431 Sciatica, right side: Secondary | ICD-10-CM

## 2023-01-29 MED ORDER — PANTOPRAZOLE SODIUM 40 MG PO TBEC
40.0000 mg | DELAYED_RELEASE_TABLET | Freq: Every day | ORAL | 0 refills | Status: DC
Start: 2023-01-29 — End: 2023-05-18

## 2023-01-29 MED ORDER — METOPROLOL TARTRATE 25 MG PO TABS: 25.0000 mg | ORAL_TABLET | Freq: Two times a day (BID) | ORAL | 0 refills | Status: DC

## 2023-01-29 NOTE — Telephone Encounter (Signed)
Pharmacy Patient Advocate Encounter   Received notification from CoverMyMeds that prior authorization for ARIPiprazole 2MG  tablets is required/requested.   Insurance verification completed.   The patient is insured through [employer] .   Per test claim: PA required; PA submitted to [facility] via CoverMyMeds Key/confirmation #/EOC  [id number] Status is pending

## 2023-01-30 MED ORDER — HYDROCODONE-ACETAMINOPHEN 10-325 MG PO TABS
1.0000 | ORAL_TABLET | Freq: Three times a day (TID) | ORAL | 0 refills | Status: DC | PRN
Start: 2023-01-30 — End: 2023-02-22

## 2023-02-01 ENCOUNTER — Other Ambulatory Visit (HOSPITAL_COMMUNITY): Payer: Self-pay

## 2023-02-01 NOTE — Telephone Encounter (Signed)
Pharmacy Patient Advocate Encounter  Received notification from [facility] that Prior Authorization for ARIPiprazole 2MG  tablets has been APPROVED from 01/29/23 to 01/29/24   PA #/Case ID/Reference #: [id number]

## 2023-02-02 NOTE — Telephone Encounter (Signed)
Informed patient of this approval.

## 2023-02-05 ENCOUNTER — Encounter: Payer: Self-pay | Admitting: Internal Medicine

## 2023-02-05 ENCOUNTER — Other Ambulatory Visit: Payer: Self-pay | Admitting: Internal Medicine

## 2023-02-05 DIAGNOSIS — N3000 Acute cystitis without hematuria: Secondary | ICD-10-CM

## 2023-02-05 MED ORDER — NITROFURANTOIN MONOHYD MACRO 100 MG PO CAPS
100.0000 mg | ORAL_CAPSULE | Freq: Two times a day (BID) | ORAL | 0 refills | Status: DC
Start: 2023-02-05 — End: 2023-02-11

## 2023-02-05 NOTE — Progress Notes (Signed)
Documentation of care plan:   I had brief face to face encounter with this patient who I have seen before on Friday 02/05/23 while I was in a meeting with Tammy Peace in [place] office.  Patient asked for antibiotics for urinary tract infection (UTI).  No appointment had been made, so the interaction was documented here.  Conversation witnessed by [employer] who advised patient she needs to make appointments for these. Patient reported she wasn't able to urinate earlier in the day as the reason.  She showed point of care urinalysis  which had negative glucose, bili, ketone, sg 1.025 bllod negative prior history 6.,0 protein negative urology negative nit positive leucocyte(s) negative. She reported ongoing urinary tract symptom(s) similar to prior urinary tract infection (UTI).Patient  stated she would go to urgent care if I wasn't comfortable prescribing, but I agreed to do prescribe as long as she places MyChart message for documentation purposes, and I documented my reasoning for the antibiotic(s) here.  Reviewed : Allergies  Allergen Reactions   Cefazolin Hives   Ondansetron Hcl Hives   Sulfa Antibiotics Rash   Tramadol Hcl     Other reaction(s): chest pain   Emgality [Galcanezumab-Gnlm]    Ultram [Tramadol] Other (See Comments)    Tachycardia   Oxycodone Rash   Sulfasalazine Rash   Reviewed: Current Outpatient Medications on File Prior to Visit  Medication Sig Dispense Refill   albuterol (VENTOLIN HFA) 108 (90 Base) MCG/ACT inhaler Inhale 2 puffs into the lungs every 6 (six) hours as needed for wheezing or shortness of breath. 18 g 3   amLODipine (NORVASC) 10 MG tablet Take 1 tablet (10 mg total) by mouth daily. Replaces amlodipine 5, for uncontrolled blood pressure. 90 tablet 3   amphetamine-dextroamphetamine (ADDERALL) 20 MG tablet Take 1 tablet (20 mg total) by mouth 2 (two) times daily. 60 tablet 0   ARIPiprazole (ABILIFY) 2 MG tablet Take 1 tablet (2 mg total) by mouth daily. 30  tablet 2   budesonide-formoterol (SYMBICORT) 160-4.5 MCG/ACT inhaler Inhale 2 puffs into the lungs 2 (two) times daily. 1 each 3   chlorhexidine (PERIDEX) 0.12 % solution Use as directed 15 mLs in the mouth or throat 2 (two) times daily. 1893 mL 0   doxepin (SINEQUAN) 10 MG capsule Take 1 capsule (10 mg total) by mouth at bedtime. 30 capsule 1   Erenumab-aooe (AIMOVIG) 140 MG/ML SOAJ Inject 140 mg into the skin every 30 (thirty) days. 3 mL 3   escitalopram (LEXAPRO) 20 MG tablet Take 1 tablet (20 mg total) by mouth daily. 90 tablet 1   fluconazole (DIFLUCAN) 150 MG tablet Take 1 tablet (150 mg total) by mouth every three (3) days as needed. 2 tablet 0   fluticasone (FLONASE) 50 MCG/ACT nasal spray Place 1 spray into both nostrils daily. 16 g 0   gabapentin (NEURONTIN) 800 MG tablet Take 1 tablet (800 mg total) by mouth 3 (three) times daily. Replaces prior dosing schedule. 90 tablet 2   HYDROcodone-acetaminophen (NORCO) 10-325 MG tablet Take 1 tablet by mouth every 8 (eight) hours as needed. 60 tablet 0   losartan (COZAAR) 100 MG tablet Take 1 tablet (100 mg total) by mouth daily. 90 tablet 1   metoprolol tartrate (LOPRESSOR) 25 MG tablet Take 1 tablet (25 mg total) by mouth 2 (two) times daily. 180 tablet 0   nystatin (MYCOSTATIN) 100000 UNIT/ML suspension Take 5 mLs (500,000 Units total) by mouth 4 (four) times daily. Swish and hold in mouth few seconds  and swallow 60 mL 0   nystatin (MYCOSTATIN/NYSTOP) powder Apply 1 Application topically 3 (three) times daily. 60 g 11   nystatin cream (MYCOSTATIN) Apply 1 Application topically 2 (two) times daily. 30 g 0   nystatin cream (MYCOSTATIN) Apply 1 Application topically 2 (two) times daily. 30 g 5   ondansetron (ZOFRAN) 4 MG tablet Take 1 tablet (4 mg total) by mouth every 8 (eight) hours as needed for nausea or vomiting. 20 tablet 0   pantoprazole (PROTONIX) 40 MG tablet Take 1 tablet (40 mg total) by mouth daily. 90 tablet 0   potassium chloride  (KLOR-CON M) 20 MEQ tablet Take 1 tablet (20 mEq total) by mouth daily. 90 tablet 0   rizatriptan (MAXALT) 10 MG tablet Take 1 tablet (10 mg total) by mouth as needed for migraine. May repeat in 2 hours if needed 10 tablet 3   tiZANidine (ZANAFLEX) 4 MG tablet Take 1 tablet (4 mg total) by mouth every 6 (six) hours as needed for muscle spasms. 30 tablet 3   Current Facility-Administered Medications on File Prior to Visit  Medication Dose Route Frequency Provider Last Rate Last Admin   ondansetron (ZOFRAN-ODT) disintegrating tablet 4 mg  4 mg Oral Once [name], MD       Plan   Prescribed Macrobid to her usual pharmacy Sharing this documentation to Primary Care Provider (PCP).

## 2023-02-11 ENCOUNTER — Encounter: Payer: Self-pay | Admitting: Internal Medicine

## 2023-02-11 ENCOUNTER — Ambulatory Visit (INDEPENDENT_AMBULATORY_CARE_PROVIDER_SITE_OTHER): Payer: Medicaid Other | Admitting: Internal Medicine

## 2023-02-11 VITALS — Wt 144.8 lb

## 2023-02-11 DIAGNOSIS — J4521 Mild intermittent asthma with (acute) exacerbation: Secondary | ICD-10-CM | POA: Diagnosis not present

## 2023-02-11 DIAGNOSIS — N3000 Acute cystitis without hematuria: Secondary | ICD-10-CM

## 2023-02-11 DIAGNOSIS — Z87440 Personal history of urinary (tract) infections: Secondary | ICD-10-CM

## 2023-02-11 DIAGNOSIS — B370 Candidal stomatitis: Secondary | ICD-10-CM | POA: Diagnosis not present

## 2023-02-11 LAB — POC URINALSYSI DIPSTICK (AUTOMATED)
Bilirubin, UA: NEGATIVE
Blood, UA: NEGATIVE
Glucose, UA: NEGATIVE
Ketones, UA: 15
Nitrite, UA: NEGATIVE
Protein, UA: NEGATIVE
Spec Grav, UA: 1.025 (ref 1.010–1.025)
Urobilinogen, UA: 0.2 U/dL
pH, UA: 5 (ref 5.0–8.0)

## 2023-02-11 LAB — URINALYSIS, MICROSCOPIC ONLY

## 2023-02-11 MED ORDER — CEPHALEXIN 500 MG PO CAPS
500.0000 mg | ORAL_CAPSULE | Freq: Four times a day (QID) | ORAL | 0 refills | Status: DC
Start: 2023-02-11 — End: 2023-02-22

## 2023-02-11 MED ORDER — ALBUTEROL SULFATE HFA 108 (90 BASE) MCG/ACT IN AERS: 2.0000 | INHALATION_SPRAY | Freq: Four times a day (QID) | RESPIRATORY_TRACT | 3 refills | Status: DC | PRN

## 2023-02-11 MED ORDER — NYSTATIN 100000 UNIT/ML MT SUSP
5.0000 mL | Freq: Four times a day (QID) | ORAL | 0 refills | Status: DC
Start: 2023-02-11 — End: 2023-05-10

## 2023-02-11 NOTE — Progress Notes (Signed)
[facility]: [phone]   -- Medical Office Visit --  Patient:  Laurie Roman      Age: 47 y.o.       Sex:  female  Date:   02/11/2023 Patient Care Team: Jeani Sow, MD as PCP - General (Family Medicine) Today's Healthcare Provider: [name], MD      Assessment & Plan Acute cystitis without hematuria Persist due to Macrobid allergy, she reports she can safely take keflex despite cefazolin allergy Will get culture and microscopy History of UTI  Thrush Photographs Taken 02/11/2023 :   Has diflucan, sent magic mouthwash, cause unclear, will follow up Dr. Ruthine Dose 1-2wk  Mild intermittent asthma with acute exacerbation  Assessment and Plan    Urinary Tract Infection (UTI) She exhibits persistent UTI symptoms, including urgency, frequency, and flank pain, indicating a failed Macrobid trial due to severe itching and possible progression to pyelonephritis. We will collect a urine culture and start Keflex.  Oral Thrush She presents with white plaque on her tongue that is not easily removed, following recent Diflucan use without improvement. We will prescribe Magic Mouthwash and continue Diflucan if available.  Recurrent Kidney Stones Her history of kidney stones accompanies recurrent UTIs, with possible dehydration contributing to stone formation. We will perform urine microscopy to identify the stone type and advise increased fluid intake, particularly water.  Follow-up She reports multiple ongoing issues, including persistent thirst and lightheadedness, low potassium, and slightly high cholesterol. We will schedule a follow-up appointment with Dr. Kristine Linea in 2 weeks to reassess the UTI and discuss other unresolved issues.       ED Discharge Orders          Ordered    POCT Urinalysis Dipstick (Automated)        02/11/23 1305    Urine Culture        02/11/23 1322    Urine Microscopic Only        02/11/23 1322    cephALEXin (KEFLEX) 500 MG  capsule  4 times daily        02/11/23 1322    magic mouthwash (nystatin, lidocaine, diphenhydrAMINE, alum & mag hydroxide) suspension  4 times daily        02/11/23 1322    albuterol (VENTOLIN HFA) 108 (90 Base) MCG/ACT inhaler  Every 6 hours PRN        02/11/23 1323          Diagnoses and all orders for this visit: Acute cystitis without hematuria -     Urine Culture -     Urine Microscopic Only -     cephALEXin (KEFLEX) 500 MG capsule; Take 1 capsule (500 mg total) by mouth 4 (four) times daily. History of UTI -     POCT Urinalysis Dipstick (Automated) Thrush -     magic mouthwash (nystatin, lidocaine, diphenhydrAMINE, alum & mag hydroxide) suspension; Swish and swallow 5 mLs 4 (four) times daily. Mild intermittent asthma with acute exacerbation -     albuterol (VENTOLIN HFA) 108 (90 Base) MCG/ACT inhaler; Inhale 2 puffs into the lungs every 6 (six) hours as needed for wheezing or shortness of breath.  Recommended follow-up: No follow-ups on file.No future appointments.          Subjective   47 y.o. female who has Migraine; ADHD; Essential hypertension; Mild obstructive sleep apnea; Controlled substance agreement signed; Gastroesophageal reflux disease without esophagitis; Left sided sciatica; Asthma; Maxillary sinusitis; Anxiety; Primary insomnia; Depression, major, single episode, severe (  HCC); Cervicalgia; Chronic dental pain; Opioid dependence (HCC); B12 deficiency; Urinary incontinence; Flank pain; Uric acid crystalluria; Right sided sciatica; Intertrigo; Chronic bilateral low back pain with sciatica; and Chronic right hip pain on their problem list. Her reasons/main concerns/chief complaints for today's office visit are Urinary Tract Infection   ------------------------------------------------------------------------------------------------------------------------ AI-Extracted: Discussed the use of AI scribe software for clinical note transcription with the patient, who gave  verbal consent to proceed.  History of Present Illness   The patient presents with a persistent urinary tract infection (UTI), characterized by urgency, frequency, and flank pain. The symptoms are similar to her previous UTI experiences. She attempted treatment with Macrobid, but discontinued after two doses due to severe itching. The patient reports no improvement in UTI symptoms following the brief Macrobid treatment.  The patient has a history of kidney stones, which may be contributing to the recurrent UTIs. She reports not eating much and not knowing what dietary changes to make to prevent kidney stones.  The patient also presents with a thick white plaque on the tongue, suspected to be oral thrush. She denies taking any steroids or having HIV, which are common causes of thrush. The patient reports severe hives after taking certain antibiotics, but feels confident she can safely take Keflex.  The patient has been experiencing increased thirst and lightheadedness for several months, prior to the onset of the current UTI. She also reports a history of low potassium levels and slightly high cholesterol. The patient is advised to follow up on these symptoms after the UTI is resolved.      She has a past medical history of ADD (attention deficit disorder), Anxiety, Asthma, Blood transfusion without reported diagnosis, Depression, GERD (gastroesophageal reflux disease), IBS (irritable bowel syndrome), Insomnia, Lumbago, Migraine, Opioid dependence (HCC) (08/21/2022), Other chest pain (08/17/2022), Sciatica, Sleep apnea, Tietze's disease, and Urinary incontinence (10/26/2022).  Problem list overviews that were updated at today's visit: No problems updated. Current Outpatient Medications on File Prior to Visit  Medication Sig   amLODipine (NORVASC) 10 MG tablet Take 1 tablet (10 mg total) by mouth daily. Replaces amlodipine 5, for uncontrolled blood pressure.   amphetamine-dextroamphetamine  (ADDERALL) 20 MG tablet Take 1 tablet (20 mg total) by mouth 2 (two) times daily.   ARIPiprazole (ABILIFY) 2 MG tablet Take 1 tablet (2 mg total) by mouth daily.   budesonide-formoterol (SYMBICORT) 160-4.5 MCG/ACT inhaler Inhale 2 puffs into the lungs 2 (two) times daily.   chlorhexidine (PERIDEX) 0.12 % solution Use as directed 15 mLs in the mouth or throat 2 (two) times daily.   doxepin (SINEQUAN) 10 MG capsule Take 1 capsule (10 mg total) by mouth at bedtime.   Erenumab-aooe (AIMOVIG) 140 MG/ML SOAJ Inject 140 mg into the skin every 30 (thirty) days.   escitalopram (LEXAPRO) 20 MG tablet Take 1 tablet (20 mg total) by mouth daily.   fluconazole (DIFLUCAN) 150 MG tablet Take 1 tablet (150 mg total) by mouth every three (3) days as needed.   fluticasone (FLONASE) 50 MCG/ACT nasal spray Place 1 spray into both nostrils daily.   gabapentin (NEURONTIN) 800 MG tablet Take 1 tablet (800 mg total) by mouth 3 (three) times daily. Replaces prior dosing schedule.   HYDROcodone-acetaminophen (NORCO) 10-325 MG tablet Take 1 tablet by mouth every 8 (eight) hours as needed.   losartan (COZAAR) 100 MG tablet Take 1 tablet (100 mg total) by mouth daily.   metoprolol tartrate (LOPRESSOR) 25 MG tablet Take 1 tablet (25 mg total) by mouth 2 (two)  times daily.   nystatin (MYCOSTATIN) 100000 UNIT/ML suspension Take 5 mLs (500,000 Units total) by mouth 4 (four) times daily. Swish and hold in mouth few seconds and swallow   nystatin (MYCOSTATIN/NYSTOP) powder Apply 1 Application topically 3 (three) times daily.   nystatin cream (MYCOSTATIN) Apply 1 Application topically 2 (two) times daily.   nystatin cream (MYCOSTATIN) Apply 1 Application topically 2 (two) times daily.   ondansetron (ZOFRAN) 4 MG tablet Take 1 tablet (4 mg total) by mouth every 8 (eight) hours as needed for nausea or vomiting.   pantoprazole (PROTONIX) 40 MG tablet Take 1 tablet (40 mg total) by mouth daily.   potassium chloride (KLOR-CON M) 20 MEQ  tablet Take 1 tablet (20 mEq total) by mouth daily.   rizatriptan (MAXALT) 10 MG tablet Take 1 tablet (10 mg total) by mouth as needed for migraine. May repeat in 2 hours if needed   tiZANidine (ZANAFLEX) 4 MG tablet Take 1 tablet (4 mg total) by mouth every 6 (six) hours as needed for muscle spasms.   Current Facility-Administered Medications on File Prior to Visit  Medication   ondansetron (ZOFRAN-ODT) disintegrating tablet 4 mg   Medications Discontinued During This Encounter  Medication Reason   nitrofurantoin, macrocrystal-monohydrate, (MACROBID) 100 MG capsule No longer needed (for PRN medications)   albuterol (VENTOLIN HFA) 108 (90 Base) MCG/ACT inhaler Reorder     Objective   Physical Exam  Wt 144 lb 12.8 oz (65.7 kg)   BMI 29.25 kg/m  Wt Readings from Last 10 Encounters:  02/11/23 144 lb 12.8 oz (65.7 kg)  01/27/23 146 lb (66.2 kg)  01/25/23 146 lb 12.8 oz (66.6 kg)  01/01/23 146 lb 9.6 oz (66.5 kg)  12/29/22 146 lb 9.6 oz (66.5 kg)  12/14/22 143 lb 2 oz (64.9 kg)  11/04/22 140 lb 2 oz (63.6 kg)  10/05/22 137 lb 6 oz (62.3 kg)  08/26/22 144 lb (65.3 kg)  08/21/22 140 lb (63.5 kg)   Vital signs reviewed.  Nursing notes reviewed. Weight trend reviewed. Abnormalities and Problem-Specific physical exam findings:  plaque on tongue VISIT SUMMARY:  During your visit, we discussed your ongoing urinary tract infection (UTI), the white plaque on your tongue (suspected oral thrush), and your history of kidney stones. We also noted your increased thirst and lightheadedness, low potassium levels, and slightly high cholesterol. We have outlined a plan to address each of these issues and will follow up in two weeks.  YOUR PLAN:  -URINARY TRACT INFECTION (UTI): A UTI is an infection in any part of your urinary system. Your symptoms indicate that the previous treatment didn't work, so we will collect a urine sample for testing and start you on a new antibiotic called Keflex.  -ORAL  THRUSH: Oral thrush is a yeast infection in the mouth. We will prescribe a medication called Magic Mouthwash and continue with Diflucan if it's available to treat the white plaque on your tongue.  -RECURRENT KIDNEY STONES: Kidney stones are hard deposits made of minerals and salts that form inside your kidneys. We suspect that dehydration may be contributing to your kidney stones. We will perform a test to identify the type of stone and recommend that you increase your fluid intake, especially water.  INSTRUCTIONS:  Please start taking the prescribed medications as directed. Remember to drink plenty of water to help with your kidney stones and UTI. We will schedule a follow-up appointment with Dr. Kristine Linea in 2 weeks to reassess your UTI and discuss your other  symptoms. If your symptoms worsen or you experience any side effects from the medications, please contact our office immediately.   General Appearance:  No acute distress appreciable.   Well-groomed, healthy-appearing female.  Well proportioned with no abnormal fat distribution.  Good muscle tone. Pulmonary:  Normal work of breathing at rest, no respiratory distress apparent.    Musculoskeletal: All extremities are intact.  Neurological:  Awake, alert, oriented, and engaged.  No obvious focal neurological deficits or cognitive impairments.  Sensorium seems unclouded.   Speech is clear and coherent with logical content. Psychiatric:  Appropriate mood, pleasant and cooperative demeanor, thoughtful and engaged during the exam  Results   LABS Urinalysis: Elevated ketones Potassium: Low Cholesterol: Mid HDL: High WBC: Slightly elevated  PATHOLOGY Tongue cytology: Thick white plaque on tongue, does not easily come off        Results for orders placed or performed in visit on 02/11/23  POCT Urinalysis Dipstick (Automated)  Result Value Ref Range   Color, UA Yellow    Clarity, UA clear    Glucose, UA Negative Negative   Bilirubin, UA  negative    Ketones, UA 15    Spec Grav, UA 1.025 1.010 - 1.025   Blood, UA negative    pH, UA 5.0 5.0 - 8.0   Protein, UA Negative Negative   Urobilinogen, UA 0.2 0.2 or 1.0 E.U./dL   Nitrite, UA negative    Leukocytes, UA Trace (A) Negative    Office Visit on 02/11/2023  Component Date Value   Color, UA 02/11/2023 Yellow    Clarity, UA 02/11/2023 clear    Glucose, UA 02/11/2023 Negative    Bilirubin, UA 02/11/2023 negative    Ketones, UA 02/11/2023 15    Spec Grav, UA 02/11/2023 1.025    Blood, UA 02/11/2023 negative    pH, UA 02/11/2023 5.0    Protein, UA 02/11/2023 Negative    Urobilinogen, UA 02/11/2023 0.2    Nitrite, UA 02/11/2023 negative    Leukocytes, UA 02/11/2023 Trace (A)   Office Visit on 01/25/2023  Component Date Value   WBC 01/25/2023 11.8 (H)    RBC 01/25/2023 4.02    Hemoglobin 01/25/2023 12.5    HCT 01/25/2023 37.6    MCV 01/25/2023 93.4    MCHC 01/25/2023 33.2    RDW 01/25/2023 13.8    Platelets 01/25/2023 375.0    Neutrophils Relative % 01/25/2023 64.2    Lymphocytes Relative 01/25/2023 27.2    Monocytes Relative 01/25/2023 6.8    Eosinophils Relative 01/25/2023 1.1    Basophils Relative 01/25/2023 0.7    Neutro Abs 01/25/2023 7.6    Lymphs Abs 01/25/2023 3.2    Monocytes Absolute 01/25/2023 0.8    Eosinophils Absolute 01/25/2023 0.1    Basophils Absolute 01/25/2023 0.1    Sodium 01/25/2023 140    Potassium 01/25/2023 3.4 (L)    Chloride 01/25/2023 102    CO2 01/25/2023 24    Glucose, Bld 01/25/2023 153 (H)    BUN 01/25/2023 14    Creatinine, Ser 01/25/2023 0.62    Total Bilirubin 01/25/2023 0.2    Alkaline Phosphatase 01/25/2023 66    AST 01/25/2023 20    ALT 01/25/2023 18    Total Protein 01/25/2023 7.9    Albumin 01/25/2023 4.9    GFR 01/25/2023 106.14    Calcium 01/25/2023 9.8    Hgb A1c MFr Bld 01/25/2023 5.8    Vitamin B-12 01/25/2023 411    HAV 1 IGG,TYPE SPECIFIC * 01/25/2023 27.60 (H)  HSV 2 IGG,TYPE SPECIFIC * 01/25/2023  <0.90    RPR Ser Ql 01/25/2023 NON-REACTIVE    Hepatitis B Surface Ag 01/25/2023 Negative    Hep B E Ag 01/25/2023 Negative    Hep B C IgM 01/25/2023 Negative    Hep B Core Total Ab 01/25/2023 Negative    Hep B E Ab 01/25/2023 Non Reactive    Hep B Surface Ab, Qual 01/25/2023 Non Reactive    Hep C Virus Ab 01/25/2023 Non Reactive    HIV Screen 4th Generatio* 01/25/2023 Non Reactive    Neisseria Gonorrhea 01/25/2023 Negative    Chlamydia 01/25/2023 Negative    Trichomonas 01/25/2023 Negative    Bacterial Vaginitis (gar* 01/25/2023 Negative    Candida Vaginitis 01/25/2023 Positive (A)    Candida Glabrata 01/25/2023 Negative    Comment 01/25/2023 Normal Reference Range Candida Species - Negative    Comment 01/25/2023 Normal Reference Range Candida Galbrata - Negative    Comment 01/25/2023 Normal Reference Range Trichomonas - Negative    Comment 01/25/2023 Normal Reference Ranger Chlamydia - Negative    Comment 01/25/2023 Normal Reference Range Neisseria Gonorrhea - Negative    Comment 01/25/2023 Normal Reference Range Bacterial Vaginosis - Negative    Cholesterol 01/25/2023 255 (H)    Triglycerides 01/25/2023 258.0 (H)    HDL 01/25/2023 81.50    VLDL 01/25/2023 51.6 (H)    Total CHOL/HDL Ratio 01/25/2023 3    NonHDL 01/25/2023 173.08    TSH 01/25/2023 0.82    Direct LDL 01/25/2023 160.0   Office Visit on 12/29/2022  Component Date Value   Color, UA 12/29/2022 yellow    Clarity, UA 12/29/2022 clear    Glucose, UA 12/29/2022 Negative    Bilirubin, UA 12/29/2022 Negative    Ketones, UA 12/29/2022 Negative    Spec Grav, UA 12/29/2022 1.020    Blood, UA 12/29/2022 Negative    pH, UA 12/29/2022 6.0    Protein, UA 12/29/2022 Negative    Urobilinogen, UA 12/29/2022 0.2    Nitrite, UA 12/29/2022 Negative    Leukocytes, UA 12/29/2022 Negative   Video Visit on 11/11/2022  Component Date Value   Neisseria Gonorrhea 11/11/2022 Negative    Chlamydia 11/11/2022 Negative    Trichomonas  11/11/2022 Negative    Bacterial Vaginitis (gar* 11/11/2022 Positive (A)    Candida Vaginitis 11/11/2022 Negative    Candida Glabrata 11/11/2022 Negative    Comment 11/11/2022 Normal Reference Range Bacterial Vaginosis - Negative    Comment 11/11/2022 Normal Reference Range Candida Species - Negative    Comment 11/11/2022 Normal Reference Range Candida Galbrata - Negative    Comment 11/11/2022 Normal Reference Range Trichomonas - Negative    Comment 11/11/2022 Normal Reference Ranger Chlamydia - Negative    Comment 11/11/2022 Normal Reference Range Neisseria Gonorrhea - Negative   Office Visit on 10/26/2022  Component Date Value   Color, UA 10/26/2022 yellow    Clarity, UA 10/26/2022 clear    Glucose, UA 10/26/2022 Negative    Bilirubin, UA 10/26/2022 negative    Ketones, UA 10/26/2022 positive    Spec Grav, UA 10/26/2022 >=1.030 (A)    Blood, UA 10/26/2022 negative    pH, UA 10/26/2022 5.0    Protein, UA 10/26/2022 Negative    Urobilinogen, UA 10/26/2022 0.2    Nitrite, UA 10/26/2022 negative    Leukocytes, UA 10/26/2022 Trace (A)    MICRO NUMBER: 10/26/2022 [id number]    SPECIMEN QUALITY: 10/26/2022 Adequate    Sample Source 10/26/2022 URINE    STATUS: 10/26/2022 FINAL  Result: 10/26/2022 No Growth    Neisseria Gonorrhea 10/26/2022 Negative    Chlamydia 10/26/2022 Negative    Trichomonas 10/26/2022 Negative    Bacterial Vaginitis-Urine 10/26/2022 Negative    Candida Urine 10/26/2022 Negative    Molecular Comment 10/26/2022 For tests bacteria and/or candida, this specimen does not meet the    Molecular Comment 10/26/2022 strict criteria set by the FDA. The result interpretation should be    Molecular Comment 10/26/2022 considered in conjunction with the patient's clinical history.    Comment 10/26/2022 Normal Reference Ranger Chlamydia - Negative    Comment 10/26/2022 Normal Reference Range Neisseria Gonorrhea - Negative    Comment 10/26/2022 Normal Reference Range Trichomonas  - Negative    WBC, UA 10/26/2022 0-2/hpf    RBC / HPF 10/26/2022 none seen    Squamous Epithelial / HPF 10/26/2022 Rare(0-4/hpf)    Uric Acid Crys, UA 10/26/2022 Presence of (A)    Amorphous 10/26/2022 Present (A)   Office Visit on 08/17/2022  Component Date Value   Vitamin B-12 08/18/2022 592    Glucose, Bld 08/18/2022 84    BUN 08/18/2022 19    Creat 08/18/2022 0.59    BUN/Creatinine Ratio 08/18/2022 SEE NOTE:    Sodium 08/18/2022 141    Potassium 08/18/2022 3.7    Chloride 08/18/2022 105    CO2 08/18/2022 23    Calcium 08/18/2022 9.8    Total Protein 08/18/2022 7.7    Albumin 08/18/2022 5.0    Globulin 08/18/2022 2.7    AG Ratio 08/18/2022 1.9    Total Bilirubin 08/18/2022 0.3    Alkaline phosphatase (AP* 08/18/2022 77    AST 08/18/2022 17    ALT 08/18/2022 11    WBC 08/18/2022 9.3    RBC 08/18/2022 4.01    Hemoglobin 08/18/2022 12.2    HCT 08/18/2022 35.4    MCV 08/18/2022 88.3    MCH 08/18/2022 30.4    MCHC 08/18/2022 34.5    RDW 08/18/2022 12.5    Platelets 08/18/2022 314    MPV 08/18/2022 11.3    Neutro Abs 08/18/2022 4,371    Lymphs Abs 08/18/2022 3,906 (H)    Absolute Monocytes 08/18/2022 688    Eosinophils Absolute 08/18/2022 233    Basophils Absolute 08/18/2022 102    Neutrophils Relative % 08/18/2022 47    Total Lymphocyte 08/18/2022 42.0    Monocytes Relative 08/18/2022 7.4    Eosinophils Relative 08/18/2022 2.5    Basophils Relative 08/18/2022 1.1    D-Dimer, Quant 08/18/2022 0.22   Office Visit on 08/06/2022  Component Date Value   Color, UA 08/06/2022 cloudy    Clarity, UA 08/06/2022 yellow    Glucose, UA 08/06/2022 Negative    Bilirubin, UA 08/06/2022 Negative    Ketones, UA 08/06/2022 Negative    Spec Grav, UA 08/06/2022 1.025    Blood, UA 08/06/2022 3+    pH, UA 08/06/2022 6.0    Protein, UA 08/06/2022 Positive (A)    Urobilinogen, UA 08/06/2022 0.2    Nitrite, UA 08/06/2022 Negative    Leukocytes, UA 08/06/2022 Moderate (2+) (A)    Office Visit on 08/04/2022  Component Date Value   Color, UA 08/04/2022 YELLOW    Glucose, UA 08/04/2022 Negative    Bilirubin, UA 08/04/2022 NEG    Ketones, UA 08/04/2022 NEG    Spec Grav, UA 08/04/2022 1.025    Blood, UA 08/04/2022 POSITIVE    pH, UA 08/04/2022 6.0    Protein, UA 08/04/2022 Positive (A)    Urobilinogen, UA 08/04/2022 0.2  Nitrite, UA 08/04/2022 NEG    Leukocytes, UA 08/04/2022 Moderate (2+) (A)    MICRO NUMBER: 08/05/2022 [id number]    SPECIMEN QUALITY: 08/05/2022 Adequate    Sample Source 08/05/2022 URINE    STATUS: 08/05/2022 FINAL    ISOLATE 1: 08/05/2022 ESBL Escherichia coli (A)   Office Visit on 07/15/2022  Component Date Value   SARS Coronavirus 2 Ag 07/15/2022 Negative    Influenza A, POC 07/15/2022 Negative    Influenza B, POC 07/15/2022 Negative   Lab on 05/13/2022  Component Date Value   Neisseria Gonorrhea 05/13/2022 Negative    Chlamydia 05/13/2022 Negative    Trichomonas 05/13/2022 Negative    Bacterial Vaginitis-Urine 05/13/2022 Negative    Candida Urine 05/13/2022 Positive (A)    Candida Urine 05/13/2022 Negative (A)    Molecular Comment 05/13/2022 For tests bacteria and/or candida, this specimen does not meet the    Molecular Comment 05/13/2022 strict criteria set by the FDA. The result interpretation should be    Molecular Comment 05/13/2022 considered in conjunction with the patient's clinical history.    Comment 05/13/2022 Normal Reference Ranger Chlamydia - Negative    Comment 05/13/2022 Normal Reference Range Neisseria Gonorrhea - Negative    Comment 05/13/2022 Normal Reference Range Trichomonas - Negative    WBC 05/13/2022 12.1 (H)    RBC 05/13/2022 4.04    Hemoglobin 05/13/2022 12.6    HCT 05/13/2022 37.2    MCV 05/13/2022 92.2    MCHC 05/13/2022 33.8    RDW 05/13/2022 13.7    Platelets 05/13/2022 398.0    Neutrophils Relative % 05/13/2022 63.9    Lymphocytes Relative 05/13/2022 24.9    Monocytes Relative 05/13/2022 7.9     Eosinophils Relative 05/13/2022 2.2    Basophils Relative 05/13/2022 1.1    Neutro Abs 05/13/2022 7.7    Lymphs Abs 05/13/2022 3.0    Monocytes Absolute 05/13/2022 1.0    Eosinophils Absolute 05/13/2022 0.3    Basophils Absolute 05/13/2022 0.1    Vitamin B-12 05/13/2022 236    Iron 05/13/2022 63    TIBC 05/13/2022 374    %SAT 05/13/2022 17    Ferritin 05/13/2022 48   There may be more visits with results that are not included.   No image results found.   MR Lumbar Spine Wo Contrast  Result Date: 01/15/2023 CLINICAL DATA:  Low back pain radiating to the right hip and leg for the past 4 months. No injury or prior surgery. EXAM: MRI LUMBAR SPINE WITHOUT CONTRAST TECHNIQUE: Multiplanar, multisequence MR imaging of the lumbar spine was performed. No intravenous contrast was administered. COMPARISON:  MRI lumbar spine dated November 15, 2018. FINDINGS: Segmentation:  Standard. Alignment: Mild dextrocurvature of the upper lumbar spine. No listhesis. Vertebrae:  No fracture, evidence of discitis, or bone lesion. Conus medullaris and cauda equina: Conus extends to the L1-L2 level. Conus and cauda equina appear normal. 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 Otherwise negative. Disc levels: T12-L1 to L3-L4:  Negative. L4-L5: Negative disc. Progressive mild bilateral facet arthropathy. No stenosis. L5-S1: Negative disc. Progressive mild bilateral facet arthropathy. No stenosis. IMPRESSION: 1. Progressive mild bilateral facet arthropathy at L4-L5 and L5-S1. No stenosis or impingement. Electronically Signed   By: Obie Dredge M.D.   On: 01/15/2023 08:34    No results found.     Additional Info: This encounter employed real-time, collaborative documentation. The patient actively reviewed and updated their medical record on a shared screen, ensuring transparency and facilitating joint problem-solving for  the problem list, overview, and plan. This approach promotes accurate,  informed care. The treatment plan was discussed and reviewed in detail, including medication safety, potential side effects, and all patient questions. We confirmed understanding and comfort with the plan. Follow-up instructions were established, including contacting the office for any concerns, returning if symptoms worsen, persist, or new symptoms develop, and precautions for potential emergency department visits.

## 2023-02-11 NOTE — Patient Instructions (Signed)
VISIT SUMMARY:  During your visit, we discussed your ongoing urinary tract infection (UTI), the white plaque on your tongue (suspected oral thrush), and your history of kidney stones. We also noted your increased thirst and lightheadedness, low potassium levels, and slightly high cholesterol. We have outlined a plan to address each of these issues and will follow up in two weeks.  YOUR PLAN:  -URINARY TRACT INFECTION (UTI): A UTI is an infection in any part of your urinary system. Your symptoms indicate that the previous treatment didn't work, so we will collect a urine sample for testing and start you on a new antibiotic called Keflex.  -ORAL THRUSH: Oral thrush is a yeast infection in the mouth. We will prescribe a medication called Magic Mouthwash and continue with Diflucan if it's available to treat the white plaque on your tongue.  -RECURRENT KIDNEY STONES: Kidney stones are hard deposits made of minerals and salts that form inside your kidneys. We suspect that dehydration may be contributing to your kidney stones. We will perform a test to identify the type of stone and recommend that you increase your fluid intake, especially water.  INSTRUCTIONS:  Please start taking the prescribed medications as directed. Remember to drink plenty of water to help with your kidney stones and UTI. We will schedule a follow-up appointment with Dr. Kristine Linea in 2 weeks to reassess your UTI and discuss your other symptoms. If your symptoms worsen or you experience any side effects from the medications, please contact our office immediately.

## 2023-02-12 LAB — URINE CULTURE
MICRO NUMBER:: 15426337
SPECIMEN QUALITY:: ADEQUATE

## 2023-02-14 NOTE — Progress Notes (Signed)
FYI

## 2023-02-16 ENCOUNTER — Ambulatory Visit (INDEPENDENT_AMBULATORY_CARE_PROVIDER_SITE_OTHER): Payer: Medicaid Other | Admitting: Family Medicine

## 2023-02-16 ENCOUNTER — Encounter: Payer: Self-pay | Admitting: Family Medicine

## 2023-02-16 ENCOUNTER — Emergency Department
Admission: EM | Admit: 2023-02-16 | Discharge: 2023-02-16 | Disposition: A | Discharge status: Home / Self Care | Payer: Medicaid Other | Attending: Emergency Medicine | Admitting: Emergency Medicine

## 2023-02-16 ENCOUNTER — Other Ambulatory Visit: Payer: Self-pay

## 2023-02-16 VITALS — BP 118/79 | HR 154 | Temp 98.1°F | Ht 59.0 in | Wt 144.0 lb

## 2023-02-16 DIAGNOSIS — E860 Dehydration: Secondary | ICD-10-CM | POA: Diagnosis not present

## 2023-02-16 DIAGNOSIS — R000 Tachycardia, unspecified: Secondary | ICD-10-CM | POA: Diagnosis not present

## 2023-02-16 DIAGNOSIS — R531 Weakness: Secondary | ICD-10-CM | POA: Diagnosis not present

## 2023-02-16 DIAGNOSIS — I959 Hypotension, unspecified: Secondary | ICD-10-CM | POA: Diagnosis not present

## 2023-02-16 DIAGNOSIS — R42 Dizziness and giddiness: Secondary | ICD-10-CM | POA: Diagnosis not present

## 2023-02-16 DIAGNOSIS — R002 Palpitations: Secondary | ICD-10-CM | POA: Diagnosis not present

## 2023-02-16 LAB — COMPREHENSIVE METABOLIC PANEL
ALT: 15 U/L (ref 0–44)
AST: 23 U/L (ref 15–41)
Albumin: 4.1 g/dL (ref 3.5–5.0)
Alkaline Phosphatase: 54 U/L (ref 38–126)
Anion gap: 11 (ref 5–15)
BUN: 15 mg/dL (ref 6–20)
CO2: 24 mmol/L (ref 22–32)
Calcium: 8.8 mg/dL — ABNORMAL LOW (ref 8.9–10.3)
Chloride: 102 mmol/L (ref 98–111)
Creatinine, Ser: 0.54 mg/dL (ref 0.44–1.00)
GFR, Estimated: >60 mL/min (ref >60)
Glucose, Bld: 132 mg/dL — ABNORMAL HIGH (ref 70–99)
Potassium: 3.4 mmol/L — ABNORMAL LOW (ref 3.5–5.1)
Sodium: 137 mmol/L (ref 135–145)
Total Bilirubin: 0.6 mg/dL (ref 0.3–1.2)
Total Protein: 6.9 g/dL (ref 6.5–8.1)

## 2023-02-16 LAB — CBC
HCT: 33 % — ABNORMAL LOW (ref 36.0–46.0)
Hemoglobin: 11 g/dL — ABNORMAL LOW (ref 12.0–15.0)
MCH: 31 pg (ref 26.0–34.0)
MCHC: 33.3 g/dL (ref 30.0–36.0)
MCV: 93 fL (ref 80.0–100.0)
Platelets: 250 10*3/uL (ref 150–400)
RBC: 3.55 MIL/uL — ABNORMAL LOW (ref 3.87–5.11)
RDW: 12.5 % (ref 11.5–15.5)
WBC: 13.6 10*3/uL — ABNORMAL HIGH (ref 4.0–10.5)
nRBC: 0 % (ref 0.0–0.2)

## 2023-02-16 LAB — URINALYSIS, ROUTINE W REFLEX MICROSCOPIC
Bilirubin Urine: NEGATIVE
Glucose, UA: NEGATIVE mg/dL
Hgb urine dipstick: NEGATIVE
Ketones, ur: NEGATIVE mg/dL
Nitrite: NEGATIVE
Protein, ur: NEGATIVE mg/dL
Specific Gravity, Urine: 1.016 (ref 1.005–1.030)
pH: 5 (ref 5.0–8.0)

## 2023-02-16 LAB — TROPONIN I (HIGH SENSITIVITY)
Troponin I (High Sensitivity): <2 ng/L (ref <18)
Troponin I (High Sensitivity): 3 ng/L (ref <18)

## 2023-02-16 NOTE — Progress Notes (Signed)
Subjective:     Patient ID: Laurie Roman, female    DOB: 05-Oct-1975, 47 y.o.   MRN: [id number]  Chief Complaint  Patient presents with   Tachycardia    HPI Tachycardia - scribe started note as well.  Need to sign off.  See other note I created for visit    There are no preventive care reminders to display for this patient.  Past Medical History:  Diagnosis Date   ADD (attention deficit disorder)    Anxiety    Asthma    Blood transfusion without reported diagnosis    Depression    GERD (gastroesophageal reflux disease)    IBS (irritable bowel syndrome)    Insomnia    Lumbago    Migraine    Opioid dependence (HCC) 08/21/2022   Explained 08/21/22 that 4 months hydrocodone for pain management for delays in dental care has resulted in hyperalgesia and dependence   Other chest pain 08/17/2022   Normal EKG and D-dimer  Associated with lifting on father Doesn't radiate  Nonexertional  Not substernal chest pain more to the side, feels related to sleeping on it wrong (reproducible)   Sciatica    Sleep apnea    Tietze's disease    Urinary incontinence 10/26/2022    Past Surgical History:  Procedure Laterality Date   APPENDECTOMY     CESAREAN SECTION     TONSILLECTOMY     TOTAL ABDOMINAL HYSTERECTOMY W/ BILATERAL SALPINGOOPHORECTOMY     TUBAL LIGATION       Current Outpatient Medications:    albuterol (VENTOLIN HFA) 108 (90 Base) MCG/ACT inhaler, Inhale 2 puffs into the lungs every 6 (six) hours as needed for wheezing or shortness of breath., Disp: 18 g, Rfl: 3   amLODipine (NORVASC) 10 MG tablet, Take 1 tablet (10 mg total) by mouth daily. Replaces amlodipine 5, for uncontrolled blood pressure., Disp: 90 tablet, Rfl: 3   amphetamine-dextroamphetamine (ADDERALL) 20 MG tablet, Take 1 tablet (20 mg total) by mouth 2 (two) times daily., Disp: 60 tablet, Rfl: 0   ARIPiprazole (ABILIFY) 2 MG tablet, Take 1 tablet (2 mg total) by mouth daily., Disp: 30 tablet, Rfl: 2    budesonide-formoterol (SYMBICORT) 160-4.5 MCG/ACT inhaler, Inhale 2 puffs into the lungs 2 (two) times daily., Disp: 1 each, Rfl: 3   cephALEXin (KEFLEX) 500 MG capsule, Take 1 capsule (500 mg total) by mouth 4 (four) times daily., Disp: 14 capsule, Rfl: 0   chlorhexidine (PERIDEX) 0.12 % solution, Use as directed 15 mLs in the mouth or throat 2 (two) times daily., Disp: 1893 mL, Rfl: 0   doxepin (SINEQUAN) 10 MG capsule, Take 1 capsule (10 mg total) by mouth at bedtime., Disp: 30 capsule, Rfl: 1   Erenumab-aooe (AIMOVIG) 140 MG/ML SOAJ, Inject 140 mg into the skin every 30 (thirty) days., Disp: 3 mL, Rfl: 3   escitalopram (LEXAPRO) 20 MG tablet, Take 1 tablet (20 mg total) by mouth daily., Disp: 90 tablet, Rfl: 1   fluconazole (DIFLUCAN) 150 MG tablet, Take 1 tablet (150 mg total) by mouth every three (3) days as needed., Disp: 2 tablet, Rfl: 0   fluticasone (FLONASE) 50 MCG/ACT nasal spray, Place 1 spray into both nostrils daily., Disp: 16 g, Rfl: 0   gabapentin (NEURONTIN) 800 MG tablet, Take 1 tablet (800 mg total) by mouth 3 (three) times daily. Replaces prior dosing schedule., Disp: 90 tablet, Rfl: 2   HYDROcodone-acetaminophen (NORCO) 10-325 MG tablet, Take 1 tablet by mouth every 8 (eight)  hours as needed., Disp: 60 tablet, Rfl: 0   losartan (COZAAR) 100 MG tablet, Take 1 tablet (100 mg total) by mouth daily., Disp: 90 tablet, Rfl: 1   magic mouthwash (nystatin, lidocaine, diphenhydrAMINE, alum & mag hydroxide) suspension, Swish and swallow 5 mLs 4 (four) times daily., Disp: 180 mL, Rfl: 0   metoprolol tartrate (LOPRESSOR) 25 MG tablet, Take 1 tablet (25 mg total) by mouth 2 (two) times daily., Disp: 180 tablet, Rfl: 0   nystatin (MYCOSTATIN) 100000 UNIT/ML suspension, Take 5 mLs (500,000 Units total) by mouth 4 (four) times daily. Swish and hold in mouth few seconds and swallow, Disp: 60 mL, Rfl: 0   nystatin (MYCOSTATIN/NYSTOP) powder, Apply 1 Application topically 3 (three) times daily.,  Disp: 60 g, Rfl: 11   nystatin cream (MYCOSTATIN), Apply 1 Application topically 2 (two) times daily., Disp: 30 g, Rfl: 0   nystatin cream (MYCOSTATIN), Apply 1 Application topically 2 (two) times daily., Disp: 30 g, Rfl: 5   ondansetron (ZOFRAN) 4 MG tablet, Take 1 tablet (4 mg total) by mouth every 8 (eight) hours as needed for nausea or vomiting., Disp: 20 tablet, Rfl: 0   pantoprazole (PROTONIX) 40 MG tablet, Take 1 tablet (40 mg total) by mouth daily., Disp: 90 tablet, Rfl: 0   potassium chloride (KLOR-CON M) 20 MEQ tablet, Take 1 tablet (20 mEq total) by mouth daily., Disp: 90 tablet, Rfl: 0   rizatriptan (MAXALT) 10 MG tablet, Take 1 tablet (10 mg total) by mouth as needed for migraine. May repeat in 2 hours if needed, Disp: 10 tablet, Rfl: 3   tiZANidine (ZANAFLEX) 4 MG tablet, Take 1 tablet (4 mg total) by mouth every 6 (six) hours as needed for muscle spasms., Disp: 30 tablet, Rfl: 3  Current Facility-Administered Medications:    ondansetron (ZOFRAN-ODT) disintegrating tablet 4 mg, 4 mg, Oral, Once, [name], MD  Allergies  Allergen Reactions   Cefazolin Hives    Only IV formulation can safely take by mouth keflex   Ondansetron Hcl Hives   Sulfa Antibiotics Rash   Tramadol Hcl     Other reaction(s): chest pain   Emgality [Galcanezumab-Gnlm]    Macrobid [Nitrofurantoin] Itching    Red welts.   Ultram [Tramadol] Other (See Comments)    Tachycardia   Oxycodone Rash   Sulfasalazine Rash   ROS neg/noncontributory except as noted HPI/below      Objective:     BP 118/79   Pulse (!) 154   Temp 98.1 F (36.7 C) (Temporal)   Ht 4\' 11"  (1.499 m)   Wt 144 lb (65.3 kg)   SpO2 98%   BMI 29.08 kg/m  Wt Readings from Last 3 Encounters:  02/16/23 144 lb 9.6 oz (65.6 kg)  02/16/23 144 lb (65.3 kg)  02/11/23 144 lb 12.8 oz (65.7 kg)    Physical Exam   Gen: WDWN NAD HEENT: NCAT, conjunctiva not injected, sclera nonicteric NECK:  supple, no thyromegaly, no nodes, no  carotid bruits CARDIAC: tachyRRR, S1S2+, no murmur. DP 2+B LUNGS: CTAB. No wheezes ABDOMEN:  BS+, soft, NTND, No HSM, no masses EXT:  no edema MSK: no gross abnormalities.  NEURO: A&O x3.  CN II-XII intact.  PSYCH: normal mood. Good eye contact     Assessment & Plan:  Tachycardia, unspecified -     EKG 12-Lead    No follow-ups on file. I,Emily Lagle,acting as a scribe for Angelena Sole, MD.,have documented all relevant documentation on the behalf of Ruffin Frederick  Ruthine Dose, MD,as directed by  Angelena Sole, MD while in the presence of Angelena Sole, MD.   I, Angelena Sole, MD, have reviewed all documentation for this visit. The documentation on 02/16/23 for the exam, diagnosis, procedures, and orders are all accurate and complete.   Angelena Sole, MD

## 2023-02-16 NOTE — ED Triage Notes (Addendum)
Pt arrives via [facility] from home for generalized weakness and dizziness that started at work. Pt HR was 147 when she was at work and she took 12.5mg  of her metoprolol. After she got home, she kept feeling weak and took her BP and it was 80s systolic. Per EMS, pt was orthostatic from sitting to standing. Pt says she is currently on Keflex for UTI. CBG 142 per EMS. Pt arrives with IVF infusing.

## 2023-02-16 NOTE — ED Provider Notes (Signed)
[facility] Provider Note    Event Date/Time   First MD Initiated Contact with Patient 02/16/23 2043     (approximate)  History   Chief Complaint: Weakness  HPI  Laurie Roman is a 47 y.o. female with a past medical history of anxiety, gastric reflux, presents to the emergency department for palpitations.  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  She thought it was because her blood pressure could be elevated so she checked her blood pressure and found that her pulse rate was elevated.  Patient states her pulse was as high as 147 bpm.  Patient's doctor check an EKG showed sinus tachycardia and sent the patient to the emergency department for further evaluation.  Patient denies any chest pain any shortness of breath no leg pain or swelling.  No pleuritic pain.  Patient states she has worn a Holter monitor in the past with no concerning findings.  Physical Exam   Triage Vital Signs: ED Triage Vitals  Encounter Vitals Group     BP 02/16/23 1809 96/78     Systolic BP Percentile --      Diastolic BP Percentile --      Pulse Rate 02/16/23 1809 80     Resp 02/16/23 1809 16     Temp 02/16/23 1809 98.6 F (37 C)     Temp Source 02/16/23 1809 Oral     SpO2 02/16/23 1809 100 %     Weight 02/16/23 1812 144 lb 9.6 oz (65.6 kg)     Height 02/16/23 1812 4\' 11"  (1.499 m)     Head Circumference --      Peak Flow --      Pain Score 02/16/23 1812 0     Pain Loc --      Pain Education --      Exclude from Growth Chart --     Most recent vital signs: Vitals:   02/16/23 1809 02/16/23 2100  BP: 96/78 125/85  Pulse: 80 85  Resp: 16 15  Temp: 98.6 F (37 C)   SpO2: 100% 100%    General: Awake, no distress.  CV:  Good peripheral perfusion.  Regular rate and rhythm  Resp:  Normal effort.  Equal breath sounds bilaterally.  Abd:  No distention.  Soft, nontender.  No rebound or  guarding.  ED Results / Procedures / Treatments   EKG  EKG viewed and interpreted by myself shows a normal sinus rhythm at 92 bpm with a narrow QRS, normal axis, normal intervals, no concerning ST changes.  MEDICATIONS ORDERED IN ED: Medications - No data to display   IMPRESSION / MDM / ASSESSMENT AND PLAN / ED COURSE  I reviewed the triage vital signs and the nursing notes.  Patient's presentation is most consistent with acute presentation with potential threat to life or bodily function.  Patient presents to the emergency department for palpitations.  Patient's workup is reassuring.  EKG in the emergency department is reassuring shows a normal sinus rhythm.  Patient denies any symptoms currently.  Pulse rate currently in the 70s.  Workup shows a negative troponin reassuring CBC reassuring chemistry, reassuring urinalysis.  I discussed with the patient follow-up with cardiology for Holter monitor.  She states her PCP is already arranged a cardiology appointment for her.  Given the patient's otherwise reassuring workup reassuring vitals reassuring physical exam I believe the patient safe for discharge home with  outpatient follow-up.  FINAL CLINICAL IMPRESSION(S) / ED DIAGNOSES   Palpitations  Note:  This document was prepared using Dragon voice recognition software and may include unintentional dictation errors.   Minna Antis, MD 02/16/23 2320

## 2023-02-16 NOTE — Progress Notes (Signed)
Subjective:     Patient ID: Laurie Roman, female    DOB: [birth year], 47 y.o.   MRN: [id number]  Chief Complaint  Patient presents with   Tachycardia    HPI Work in employee for [employer], tachycardia.  Was just working and dizziness.  Then, feeling weak and "not right".  Checked vitals and bp's all over and HR 140-150's.   Didn't take adderall, no drugs, no sudafed.  No cp,n/v/d.   Some mild sob.  Had ha earlier today, but not currently. Usu HR <110.  Takes metoprolol at hs so not yet today Labs done 01/24/23.  TSH 0.82  There are no preventive care reminders to display for this patient.  Past Medical History:  Diagnosis Date   ADD (attention deficit disorder)    Anxiety    Asthma    Blood transfusion without reported diagnosis    Depression    GERD (gastroesophageal reflux disease)    IBS (irritable bowel syndrome)    Insomnia    Lumbago    Migraine    Opioid dependence (HCC) 08/21/2022   Explained 08/21/22 that 4 months hydrocodone for pain management for delays in dental care has resulted in hyperalgesia and dependence   Other chest pain 08/17/2022   Normal EKG and D-dimer  Associated with lifting on father Doesn't radiate  Nonexertional  Not substernal chest pain more to the side, feels related to sleeping on it wrong (reproducible)   Sciatica    Sleep apnea    Tietze's disease    Urinary incontinence 10/26/2022    Past Surgical History:  Procedure Laterality Date   APPENDECTOMY     CESAREAN SECTION     TONSILLECTOMY     TOTAL ABDOMINAL HYSTERECTOMY W/ BILATERAL SALPINGOOPHORECTOMY     TUBAL LIGATION       Current Outpatient Medications:    albuterol (VENTOLIN HFA) 108 (90 Base) MCG/ACT inhaler, Inhale 2 puffs into the lungs every 6 (six) hours as needed for wheezing or shortness of breath., Disp: 18 g, Rfl: 3   amLODipine (NORVASC) 10 MG tablet, Take 1 tablet (10 mg total) by mouth daily. Replaces amlodipine 5, for uncontrolled blood pressure.,  Disp: 90 tablet, Rfl: 3   amphetamine-dextroamphetamine (ADDERALL) 20 MG tablet, Take 1 tablet (20 mg total) by mouth 2 (two) times daily., Disp: 60 tablet, Rfl: 0   ARIPiprazole (ABILIFY) 2 MG tablet, Take 1 tablet (2 mg total) by mouth daily., Disp: 30 tablet, Rfl: 2   budesonide-formoterol (SYMBICORT) 160-4.5 MCG/ACT inhaler, Inhale 2 puffs into the lungs 2 (two) times daily., Disp: 1 each, Rfl: 3   cephALEXin (KEFLEX) 500 MG capsule, Take 1 capsule (500 mg total) by mouth 4 (four) times daily., Disp: 14 capsule, Rfl: 0   chlorhexidine (PERIDEX) 0.12 % solution, Use as directed 15 mLs in the mouth or throat 2 (two) times daily., Disp: 1893 mL, Rfl: 0   doxepin (SINEQUAN) 10 MG capsule, Take 1 capsule (10 mg total) by mouth at bedtime., Disp: 30 capsule, Rfl: 1   Erenumab-aooe (AIMOVIG) 140 MG/ML SOAJ, Inject 140 mg into the skin every 30 (thirty) days., Disp: 3 mL, Rfl: 3   escitalopram (LEXAPRO) 20 MG tablet, Take 1 tablet (20 mg total) by mouth daily., Disp: 90 tablet, Rfl: 1   fluconazole (DIFLUCAN) 150 MG tablet, Take 1 tablet (150 mg total) by mouth every three (3) days as needed., Disp: 2 tablet, Rfl: 0   fluticasone (FLONASE) 50 MCG/ACT nasal spray, Place 1 spray into  both nostrils daily., Disp: 16 g, Rfl: 0   gabapentin (NEURONTIN) 800 MG tablet, Take 1 tablet (800 mg total) by mouth 3 (three) times daily. Replaces prior dosing schedule., Disp: 90 tablet, Rfl: 2   HYDROcodone-acetaminophen (NORCO) 10-325 MG tablet, Take 1 tablet by mouth every 8 (eight) hours as needed., Disp: 60 tablet, Rfl: 0   losartan (COZAAR) 100 MG tablet, Take 1 tablet (100 mg total) by mouth daily., Disp: 90 tablet, Rfl: 1   magic mouthwash (nystatin, lidocaine, diphenhydrAMINE, alum & mag hydroxide) suspension, Swish and swallow 5 mLs 4 (four) times daily., Disp: 180 mL, Rfl: 0   metoprolol tartrate (LOPRESSOR) 25 MG tablet, Take 1 tablet (25 mg total) by mouth 2 (two) times daily., Disp: 180 tablet, Rfl: 0    nystatin (MYCOSTATIN) 100000 UNIT/ML suspension, Take 5 mLs (500,000 Units total) by mouth 4 (four) times daily. Swish and hold in mouth few seconds and swallow, Disp: 60 mL, Rfl: 0   nystatin (MYCOSTATIN/NYSTOP) powder, Apply 1 Application topically 3 (three) times daily., Disp: 60 g, Rfl: 11   nystatin cream (MYCOSTATIN), Apply 1 Application topically 2 (two) times daily., Disp: 30 g, Rfl: 0   nystatin cream (MYCOSTATIN), Apply 1 Application topically 2 (two) times daily., Disp: 30 g, Rfl: 5   ondansetron (ZOFRAN) 4 MG tablet, Take 1 tablet (4 mg total) by mouth every 8 (eight) hours as needed for nausea or vomiting., Disp: 20 tablet, Rfl: 0   pantoprazole (PROTONIX) 40 MG tablet, Take 1 tablet (40 mg total) by mouth daily., Disp: 90 tablet, Rfl: 0   potassium chloride (KLOR-CON M) 20 MEQ tablet, Take 1 tablet (20 mEq total) by mouth daily., Disp: 90 tablet, Rfl: 0   rizatriptan (MAXALT) 10 MG tablet, Take 1 tablet (10 mg total) by mouth as needed for migraine. May repeat in 2 hours if needed, Disp: 10 tablet, Rfl: 3   tiZANidine (ZANAFLEX) 4 MG tablet, Take 1 tablet (4 mg total) by mouth every 6 (six) hours as needed for muscle spasms., Disp: 30 tablet, Rfl: 3  Current Facility-Administered Medications:    ondansetron (ZOFRAN-ODT) disintegrating tablet 4 mg, 4 mg, Oral, Once, [name], MD  Allergies  Allergen Reactions   Cefazolin Hives    Only IV formulation can safely take by mouth keflex   Ondansetron Hcl Hives   Sulfa Antibiotics Rash   Tramadol Hcl     Other reaction(s): chest pain   Emgality [Galcanezumab-Gnlm]    Macrobid [Nitrofurantoin] Itching    Red welts.   Ultram [Tramadol] Other (See Comments)    Tachycardia   Oxycodone Rash   Sulfasalazine Rash   ROS neg/noncontributory except as noted HPI/below      Objective:     BP 118/79   Pulse (!) 154   SpO2 98%  Wt Readings from Last 3 Encounters:  02/11/23 144 lb 12.8 oz (65.7 kg)  01/27/23 146 lb (66.2 kg)   01/25/23 146 lb 12.8 oz (66.6 kg)    Physical Exam   Gen: WDWN NAD-tired appearing HEENT: NCAT, conjunctiva not injected, sclera nonicteric NECK:  supple, no thyromegaly, no nodes, no carotid bruits CARDIAC: tachy RRR, S1S2+, no murmur. LUNGS: CTAB. No wheezes ABDOMEN:  BS+, soft, NTND, No HSM, no masses EXT:  no edema MSK: no gross abnormalities.  NEURO: A&O x3.  CN II-XII intact.  PSYCH: normal mood. Good eye contact  EKG: sinus tach 118. 1 pvc. No st elevations.  Ns st depression  Re-eval 135pm-hr 91.  Pt  took metoprolol ago Re-eval-Pulse 77.  Ambulating 90's.  Drinking water.  Feeling better.  Tried to work, but "dizzy".  HR remained in 70-90 range for over 1 hr.  Pt preferred not going to ER.  Expressed my concerns that blood pressure and heart rate may drop more, or may all recur.  She thinks she will be "okay" and would like to just go home.  If recurs, needs to go straight to the emergency room.  Needs to drink plenty of fluids    Assessment & Plan:  Tachycardia, unspecified -     EKG 12-Lead   Tachycardia-etiology unclear.  Patient has a longstanding history of hypertension that is labile.  Still has not done her 24-hour urine for catecholamines.  Advise she really needs to get this done.  Not sure what precipitated this.  Needs to monitor closely.  Take metoprolol half tab twice daily.  If feeling worse, recurs with rapid heart rate, bp drops, etc, needs to go to the emergency room  No follow-ups on file.  Angelena Sole, MD

## 2023-02-18 ENCOUNTER — Other Ambulatory Visit: Payer: Self-pay | Admitting: *Deleted

## 2023-02-18 DIAGNOSIS — R000 Tachycardia, unspecified: Secondary | ICD-10-CM

## 2023-02-19 ENCOUNTER — Encounter: Payer: Self-pay | Admitting: Internal Medicine

## 2023-02-19 ENCOUNTER — Encounter: Payer: Self-pay | Admitting: Family Medicine

## 2023-02-22 ENCOUNTER — Other Ambulatory Visit: Payer: Medicaid Other

## 2023-02-22 ENCOUNTER — Encounter: Payer: Self-pay | Admitting: Internal Medicine

## 2023-02-22 ENCOUNTER — Ambulatory Visit (INDEPENDENT_AMBULATORY_CARE_PROVIDER_SITE_OTHER): Payer: Medicaid Other | Admitting: Internal Medicine

## 2023-02-22 VITALS — BP 123/85 | HR 111 | Temp 98.3°F | Resp 16 | Ht 59.0 in | Wt 144.0 lb

## 2023-02-22 DIAGNOSIS — E876 Hypokalemia: Secondary | ICD-10-CM | POA: Diagnosis not present

## 2023-02-22 DIAGNOSIS — D72828 Other elevated white blood cell count: Secondary | ICD-10-CM

## 2023-02-22 DIAGNOSIS — F902 Attention-deficit hyperactivity disorder, combined type: Secondary | ICD-10-CM

## 2023-02-22 DIAGNOSIS — M5431 Sciatica, right side: Secondary | ICD-10-CM | POA: Diagnosis not present

## 2023-02-22 DIAGNOSIS — R0602 Shortness of breath: Secondary | ICD-10-CM | POA: Diagnosis not present

## 2023-02-22 DIAGNOSIS — N390 Urinary tract infection, site not specified: Secondary | ICD-10-CM | POA: Diagnosis not present

## 2023-02-22 DIAGNOSIS — R7309 Other abnormal glucose: Secondary | ICD-10-CM

## 2023-02-22 DIAGNOSIS — R000 Tachycardia, unspecified: Secondary | ICD-10-CM | POA: Insufficient documentation

## 2023-02-22 DIAGNOSIS — F419 Anxiety disorder, unspecified: Secondary | ICD-10-CM | POA: Diagnosis not present

## 2023-02-22 DIAGNOSIS — I10 Essential (primary) hypertension: Secondary | ICD-10-CM | POA: Diagnosis not present

## 2023-02-22 LAB — COMPREHENSIVE METABOLIC PANEL
ALT: 16 U/L (ref 0–35)
AST: 19 U/L (ref 0–37)
Albumin: 4.8 g/dL (ref 3.5–5.2)
Alkaline Phosphatase: 72 U/L (ref 39–117)
BUN: 11 mg/dL (ref 6–23)
CO2: 28 meq/L (ref 19–32)
Calcium: 9.9 mg/dL (ref 8.4–10.5)
Chloride: 101 meq/L (ref 96–112)
Creatinine, Ser: 0.55 mg/dL (ref 0.40–1.20)
GFR: 109.19 mL/min (ref >60.00)
Glucose, Bld: 84 mg/dL (ref 70–99)
Potassium: 3.9 meq/L (ref 3.5–5.1)
Sodium: 139 meq/L (ref 135–145)
Total Bilirubin: 0.2 mg/dL (ref 0.2–1.2)
Total Protein: 7.9 g/dL (ref 6.0–8.3)

## 2023-02-22 LAB — CBC WITH DIFFERENTIAL/PLATELET
Basophils Absolute: 0.1 10*3/uL (ref 0.0–0.1)
Basophils Relative: 0.5 % (ref 0.0–3.0)
Eosinophils Absolute: 0.1 10*3/uL (ref 0.0–0.7)
Eosinophils Relative: 0.5 % (ref 0.0–5.0)
HCT: 38.9 % (ref 36.0–46.0)
Hemoglobin: 12.5 g/dL (ref 12.0–15.0)
Lymphocytes Relative: 22.4 % (ref 12.0–46.0)
Lymphs Abs: 3 10*3/uL (ref 0.7–4.0)
MCHC: 32.1 g/dL (ref 30.0–36.0)
MCV: 93.1 fl (ref 78.0–100.0)
Monocytes Absolute: 1.1 10*3/uL — ABNORMAL HIGH (ref 0.1–1.0)
Monocytes Relative: 8.3 % (ref 3.0–12.0)
Neutro Abs: 9.1 10*3/uL — ABNORMAL HIGH (ref 1.4–7.7)
Neutrophils Relative %: 68.3 % (ref 43.0–77.0)
Platelets: 374 10*3/uL (ref 150.0–400.0)
RBC: 4.18 Mil/uL (ref 3.87–5.11)
RDW: 13.5 % (ref 11.5–15.5)
WBC: 13.3 10*3/uL — ABNORMAL HIGH (ref 4.0–10.5)

## 2023-02-22 LAB — GLUCOSE, POCT (MANUAL RESULT ENTRY): POC Glucose: 107 mg/dL — AB (ref 70–99)

## 2023-02-22 LAB — POCT URINALYSIS DIPSTICK
Bilirubin, UA: NEGATIVE
Blood, UA: NEGATIVE
Glucose, UA: NEGATIVE
Ketones, UA: NEGATIVE
Leukocytes, UA: NEGATIVE
Nitrite, UA: NEGATIVE
Protein, UA: NEGATIVE
Spec Grav, UA: 1.02 (ref 1.010–1.025)
Urobilinogen, UA: 0.2 U/dL
pH, UA: 6 (ref 5.0–8.0)

## 2023-02-22 LAB — URINALYSIS, ROUTINE W REFLEX MICROSCOPIC
Bilirubin Urine: NEGATIVE
Hgb urine dipstick: NEGATIVE
Ketones, ur: NEGATIVE
Leukocytes,Ua: NEGATIVE
Nitrite: NEGATIVE
Specific Gravity, Urine: 1.015 (ref 1.000–1.030)
Total Protein, Urine: NEGATIVE
Urine Glucose: NEGATIVE
Urobilinogen, UA: 0.2 (ref 0.0–1.0)
pH: 6 (ref 5.0–8.0)

## 2023-02-22 LAB — D-DIMER, QUANTITATIVE: D-Dimer, Quant: 0.29 ug{FEU}/mL (ref <0.50)

## 2023-02-22 LAB — TROPONIN I: Troponin I: <3 ng/L (ref <=47)

## 2023-02-22 LAB — TROPONIN I (HIGH SENSITIVITY): High Sens Troponin I: 4 ng/L (ref 2–17)

## 2023-02-22 MED ORDER — AMPHETAMINE-DEXTROAMPHETAMINE 20 MG PO TABS
20.0000 mg | ORAL_TABLET | Freq: Two times a day (BID) | ORAL | 0 refills | Status: DC
Start: 2023-02-22 — End: 2023-02-22

## 2023-02-22 MED ORDER — HYDROCODONE-ACETAMINOPHEN 10-325 MG PO TABS
1.0000 | ORAL_TABLET | Freq: Three times a day (TID) | ORAL | 0 refills | Status: AC | PRN
Start: 2023-02-22 — End: ?

## 2023-02-22 MED ORDER — AMPHETAMINE-DEXTROAMPHETAMINE 20 MG PO TABS
20.0000 mg | ORAL_TABLET | Freq: Two times a day (BID) | ORAL | 0 refills | Status: DC
Start: 2023-02-22 — End: 2023-03-24

## 2023-02-22 MED ORDER — ARIPIPRAZOLE 2 MG PO TABS
2.0000 mg | ORAL_TABLET | Freq: Every day | ORAL | Status: DC | PRN
Start: 2023-02-22 — End: 2023-05-27

## 2023-02-22 MED ORDER — HYDROCODONE-ACETAMINOPHEN 10-325 MG PO TABS: 1.0000 | ORAL_TABLET | Freq: Three times a day (TID) | ORAL | 0 refills | Status: DC | PRN

## 2023-02-22 NOTE — Assessment & Plan Note (Addendum)
She has reported high blood pressure readings at home. We advise her to continue monitoring her blood pressure at home.

## 2023-02-22 NOTE — Assessment & Plan Note (Signed)
Refills medications due to normal provider Dr. Ruthine Dose, my colleague, currently out of office for next 2 weeks PDMP reviewed during this encounter. Sent these to [facility] later in day at patient request

## 2023-02-22 NOTE — Progress Notes (Signed)
[facility]: [phone]   -- Medical Office Visit --  Patient:  Laurie Roman      Age: 47 y.o.       Sex:  female  Date:   02/22/2023 Patient Care Team: Jeani Sow, MD as PCP - General (Family Medicine) Shaune Leeks as Social Worker Today's Healthcare Provider: [name], MD     Assessment & Plan Tachycardia Advised the patient that the most likely cause of her tachycardia and shortness of breath now that we have worked for little pain fruit is blood clot of the lungs I encouraged her to return to the ER but she side and so instead I went ahead and ordered a D-dimer to help decide about  a CT angiogram chest pulmonary embolism rule out.  Emergency room did not perform this (notes reviewed) but did note she did not report pleuritic pain. However she does report mild shortness of breath today.  Persistent high heart rate suggests possible AFib or multifocal atrial tachycardia, as indicated by EKG findings. She feels lightheaded and describes a "weird" sensation on the current Propranolol dose, with a noted low blood pressure during a recent ER visit. We will order a Holter monitor for further evaluation and check troponin levels to assess for cardiac strain. Adjusting the Propranolol dose will be considered pending the results of the workup.  She has not had any signs of infection such as a cough or fever but she has not had a chest x-ray or CAT scan of the chest to look for the cause of the tachycardia so I think this is the next step since it is persisting despite propranolol and even associated with mild shortness of breath  Anxiety This is mild currently and I don't think its a major contributor to the tachycardia although she is clearly very concerned about it and has been to the emergency room about it and the low blood pressure. I believe discerning the cause will resolve the anxiety. Attention deficit hyperactivity disorder (ADHD), combined  type Refills medications due to normal provider Dr. Ruthine Dose, my colleague, currently out of office for next 2 weeks PDMP reviewed during this encounter. Sent these to [facility] later in day at patient request  Right sided sciatica And patient will recommend injection Shortness of breath Persistent high heart rate suggests possible AFib or multifocal atrial tachycardia, as indicated by EKG findings. She feels lightheaded and describes a "weird" sensation on the current Propranolol dose, with a noted low blood pressure during a recent ER visit. We will order a Holter monitor for further evaluation and check troponin levels to assess for cardiac strain. Adjusting the Propranolol dose will be considered pending the results of the workup.  She presents with new onset shortness of breath and tachycardia, without fever, cough, or other infection signs. A D-dimer test will be ordered, followed by a CT angiogram of the chest if the D-dimer result is positive.  We discussed possibly just going to the emergency room today but the symptoms have been stable and she would rather not return again after you having just been a few days ago for low blood pressure and tachycardia and not having a great experience Urinary tract infection without hematuria, site unspecified She reports a strong smell to her urine following a recent course of antibiotics. A urinalysis and culture will be ordered, and if positive, treatment with appropriate antibiotics will commence.  This is 1 possible explanation for her high heart rate  and low blood pressure Essential hypertension She has reported high blood pressure readings at home. We advise her to continue monitoring her blood pressure at home. Other elevated white blood cell (WBC) count This was also elevated in the emergency room cause unclear she does not have other signs of infection could be stress related we will recheck the levels Hypokalemia This was 3.4 in the ER and  at home but she is reports she is taking a potassium supplement and might have a persistent UTI to explain it we will recheck Tachycardia, unspecified Source of this unclear but with blood pressure much lower than historical on propranolol, and dyspnea on exertion, needs pulmonary embolism ruled out. Will get ddimer, troponin recheck today.  Elevated glucose level Minimal- not concerning for diabetes mellitus. Likely stress related  Patient's normal primary care is out of town and I am helping to cover for and also she needs this visit to get approved to return to work.  Therefore I will refill her chronic controlled substances with no changes and I approve her to return to work immediately although we do need to adjust and monitor the propranolol and heart rate to make sure she stays within safe ranges so she works with me so she can just check it regularly during the day throughout the day and let me know if there is a problem.    Medication Management She is currently taking Propranolol, thyroid medication, Abilify as needed, amphetamines, and hydrocodone. We will change Abilify to as needed, as she reported, and renew prescriptions for amphetamines and hydrocodone. The Propranolol dose adjustment will be considered pending the results of the workup.  PDMP reviewed during this encounter.  Have coordinated management plan with Dr. Ruthine Dose who intends to shift chronic opioid management to pain management specialist once established.      Diagnoses and all orders for this visit: Tachycardia -     LONG TERM MONITOR (3-14 DAYS); Future -     D-Dimer, Quantitative -     Troponin I -     Urinalysis, Routine w reflex microscopic -     CBC with Differential/Platelet -     Comp Met (CMET) -     Troponin I (High Sensitivity); Future Anxiety -     ARIPiprazole (ABILIFY) 2 MG tablet; Take 1 tablet (2 mg total) by mouth daily as needed. Attention deficit hyperactivity disorder (ADHD), combined type -      amphetamine-dextroamphetamine (ADDERALL) 20 MG tablet; Take 1 tablet (20 mg total) by mouth 2 (two) times daily. Right sided sciatica -     HYDROcodone-acetaminophen (NORCO) 10-325 MG tablet; Take 1 tablet by mouth every 8 (eight) hours as needed. Shortness of breath -     LONG TERM MONITOR (3-14 DAYS); Future -     D-Dimer, Quantitative -     Troponin I -     Urinalysis, Routine w reflex microscopic -     CBC with Differential/Platelet -     Comp Met (CMET) -     Troponin I (High Sensitivity); Future Urinary tract infection without hematuria, site unspecified -     POCT Urinalysis Dipstick Essential hypertension -     Troponin I (High Sensitivity); Future Other elevated white blood cell (WBC) count Hypokalemia Tachycardia, unspecified Elevated glucose level -     POCT glucose (manual entry)  Recommended follow-up: No follow-ups on file. Future Appointments  Date Time Provider Department Center  03/24/2023  3:00 PM Shaune Leeks [facility] None  Subjective   47 y.o. female who has Migraine; ADHD; Essential hypertension; Mild obstructive sleep apnea; Controlled substance agreement signed; Gastroesophageal reflux disease without esophagitis; Left sided sciatica; Asthma; Maxillary sinusitis; Anxiety; Primary insomnia; Depression, major, single episode, severe (HCC); Cervicalgia; Chronic dental pain; Opioid dependence (HCC); B12 deficiency; Urinary incontinence; Flank pain; Uric acid crystalluria; Right sided sciatica; Intertrigo; Chronic bilateral low back pain with sciatica; Chronic right hip pain; and Tachycardia on their problem list. Her reasons/main concerns/chief complaints for today's office visit are Follow-up Micah Flesher to ED ON 9/10 for palpitations.)   ------------------------------------------------------------------------------------------------------------------------ AI-Extracted: Discussed the use of AI scribe software for clinical note transcription with the  patient, who gave verbal consent to proceed.  History of Present Illness   The patient, a phlebotomist, presented for an urgent check-in due to concerns about her heart. She reported feeling lightheaded and experiencing an elevated heart rate, which she described as her "heart acting stupid." These symptoms had been ongoing since the previous Wednesday and had caused concern at her workplace, prompting the visit. The patient reported that she had not felt her heart "flipping out," but her heart rate was consistently high, reaching 160 beats per minute.  The patient had been taking a beta blocker, propranolol, twice daily as prescribed by her primary care physician, Dr. Kristine Linea. However, she reported feeling lightheaded and "weird" after taking the medication, even when the dose was reduced to half. The patient expressed a desire to change or stop this medication due to these side effects.  In addition to the cardiac concerns, the patient reported a recent urinary tract infection (UTI) for which she had completed a course of cephalexin. She noted that her urine still had a strong smell, suggesting a possible ongoing infection.  The patient also reported shortness of breath, which was a new symptom associated with her elevated heart rate. She had been using an inhaler to manage this symptom. The patient also mentioned a history of high blood pressure, which she was managing with medication.  The patient's other medications included Adderall, which she had not taken for a week, and a thyroid medication, which she was taking at half the prescribed dose. She also reported taking potassium pills and a medication for sciatica pain.  The patient expressed anxiety about her symptoms and a fear of a potential heart attack. She reported feeling "scared" and "spaced out," particularly after taking her blood pressure medication. Despite these concerns, the patient felt comfortable returning to work, as she could  monitor her heart rate and blood pressure regularly throughout the day.  In summary, the patient presented with an elevated heart rate, lightheadedness, and shortness of breath, which she attributed to her heart condition and the side effects of her beta blocker medication. She also reported a recent UTI and ongoing urinary symptoms. The patient expressed significant anxiety about her symptoms and a desire to adjust her medication regimen.      She has a past medical history of ADD (attention deficit disorder), Anxiety, Asthma, Blood transfusion without reported diagnosis, Depression, GERD (gastroesophageal reflux disease), IBS (irritable bowel syndrome), Insomnia, Lumbago, Migraine, Opioid dependence (HCC) (08/21/2022), Other chest pain (08/17/2022), Sciatica, Sleep apnea, Tietze's disease, and Urinary incontinence (10/26/2022).  Problem list overviews that were updated at today's visit: Problem  Tachycardia  Right Sided Sciatica  Essential Hypertension   Current Outpatient Medications on File Prior to Visit  Medication Sig   albuterol (VENTOLIN HFA) 108 (90 Base) MCG/ACT inhaler Inhale 2 puffs into the lungs every 6 (six)  hours as needed for wheezing or shortness of breath.   amLODipine (NORVASC) 10 MG tablet Take 1 tablet (10 mg total) by mouth daily. Replaces amlodipine 5, for uncontrolled blood pressure.   budesonide-formoterol (SYMBICORT) 160-4.5 MCG/ACT inhaler Inhale 2 puffs into the lungs 2 (two) times daily.   chlorhexidine (PERIDEX) 0.12 % solution Use as directed 15 mLs in the mouth or throat 2 (two) times daily.   doxepin (SINEQUAN) 10 MG capsule Take 1 capsule (10 mg total) by mouth at bedtime.   Erenumab-aooe (AIMOVIG) 140 MG/ML SOAJ Inject 140 mg into the skin every 30 (thirty) days.   escitalopram (LEXAPRO) 20 MG tablet Take 1 tablet (20 mg total) by mouth daily.   fluconazole (DIFLUCAN) 150 MG tablet Take 1 tablet (150 mg total) by mouth every three (3) days as needed.    fluticasone (FLONASE) 50 MCG/ACT nasal spray Place 1 spray into both nostrils daily.   gabapentin (NEURONTIN) 800 MG tablet Take 1 tablet (800 mg total) by mouth 3 (three) times daily. Replaces prior dosing schedule.   losartan (COZAAR) 100 MG tablet Take 1 tablet (100 mg total) by mouth daily.   magic mouthwash (nystatin, lidocaine, diphenhydrAMINE, alum & mag hydroxide) suspension Swish and swallow 5 mLs 4 (four) times daily.   metoprolol tartrate (LOPRESSOR) 25 MG tablet Take 1 tablet (25 mg total) by mouth 2 (two) times daily.   nystatin (MYCOSTATIN) 100000 UNIT/ML suspension Take 5 mLs (500,000 Units total) by mouth 4 (four) times daily. Swish and hold in mouth few seconds and swallow   nystatin (MYCOSTATIN/NYSTOP) powder Apply 1 Application topically 3 (three) times daily.   nystatin cream (MYCOSTATIN) Apply 1 Application topically 2 (two) times daily.   nystatin cream (MYCOSTATIN) Apply 1 Application topically 2 (two) times daily.   ondansetron (ZOFRAN) 4 MG tablet Take 1 tablet (4 mg total) by mouth every 8 (eight) hours as needed for nausea or vomiting.   pantoprazole (PROTONIX) 40 MG tablet Take 1 tablet (40 mg total) by mouth daily.   potassium chloride (KLOR-CON M) 20 MEQ tablet Take 1 tablet (20 mEq total) by mouth daily.   rizatriptan (MAXALT) 10 MG tablet Take 1 tablet (10 mg total) by mouth as needed for migraine. May repeat in 2 hours if needed   tiZANidine (ZANAFLEX) 4 MG tablet Take 1 tablet (4 mg total) by mouth every 6 (six) hours as needed for muscle spasms.   Current Facility-Administered Medications on File Prior to Visit  Medication   ondansetron (ZOFRAN-ODT) disintegrating tablet 4 mg   Medications Discontinued During This Encounter  Medication Reason   cephALEXin (KEFLEX) 500 MG capsule Completed Course   ARIPiprazole (ABILIFY) 2 MG tablet    amphetamine-dextroamphetamine (ADDERALL) 20 MG tablet Reorder   HYDROcodone-acetaminophen (NORCO) 10-325 MG tablet Reorder    HYDROcodone-acetaminophen (NORCO) 10-325 MG tablet Expired Prescription   HYDROcodone-acetaminophen (NORCO) 10-325 MG tablet Expired Prescription   HYDROcodone-acetaminophen (NORCO) 10-325 MG tablet Expired Prescription   HYDROcodone-acetaminophen (NORCO) 10-325 MG tablet Expired Prescription   HYDROcodone-acetaminophen (NORCO) 10-325 MG tablet Expired Prescription   amphetamine-dextroamphetamine (ADDERALL) 20 MG tablet    HYDROcodone-acetaminophen (NORCO) 10-325 MG tablet      Objective   Physical Exam  BP 123/85   Pulse (!) 111   Temp 98.3 F (36.8 C)   Resp 16   Ht 4\' 11"  (1.499 m)   Wt 144 lb (65.3 kg)   SpO2 99%   BMI 29.08 kg/m  Wt Readings from Last 10 Encounters:  02/22/23 144 lb (  65.3 kg)  02/16/23 144 lb 9.6 oz (65.6 kg)  02/16/23 144 lb (65.3 kg)  02/11/23 144 lb 12.8 oz (65.7 kg)  01/27/23 146 lb (66.2 kg)  01/25/23 146 lb 12.8 oz (66.6 kg)  01/01/23 146 lb 9.6 oz (66.5 kg)  12/29/22 146 lb 9.6 oz (66.5 kg)  12/14/22 143 lb 2 oz (64.9 kg)  11/04/22 140 lb 2 oz (63.6 kg)   Vital signs reviewed.  Nursing notes reviewed. Weight trend reviewed. Abnormalities and Problem-Specific physical exam findings:  normal work of breathing - lungs clear, hyperdynamic heart sounds with loud s1, s2.  General Appearance:  No acute distress appreciable.   Well-groomed, healthy-appearing female.  Well proportioned with no abnormal fat distribution.  Good muscle tone. Pulmonary:  Normal work of breathing at rest, no respiratory distress apparent. SpO2: 99 %  Musculoskeletal: All extremities are intact.  Neurological:  Awake, alert, oriented, and engaged.  No obvious focal neurological deficits or cognitive impairments.  Sensorium seems unclouded.   Speech is clear and coherent with logical content. Psychiatric:  Appropriate mood, pleasant and cooperative demeanor, thoughtful and engaged during the exam  Results   LABS Troponin: 3 (02/17/2023) CBC: WBC elevated  (02/17/2023) Urinalysis: Normal (02/17/2023)  DIAGNOSTIC EKG: Tachycardia (02/17/2023)        Results for orders placed or performed in visit on 02/22/23  D-Dimer, Quantitative  Result Value Ref Range   D-Dimer, Quant 0.29 <0.50 mcg/mL FEU  Troponin I  Result Value Ref Range   Troponin I <3 < OR = 47 ng/L  Urinalysis, Routine w reflex microscopic  Result Value Ref Range   Color, Urine YELLOW Yellow;Lt. Yellow;Straw;Dark Yellow;Amber;Green;Red;Brown   APPearance CLEAR Clear;Turbid;Slightly Cloudy;Cloudy   Specific Gravity, Urine 1.015 1.000 - 1.030   pH 6.0 5.0 - 8.0   Total Protein, Urine NEGATIVE Negative   Urine Glucose NEGATIVE Negative   Ketones, ur NEGATIVE Negative   Bilirubin Urine NEGATIVE Negative   Hgb urine dipstick NEGATIVE Negative   Urobilinogen, UA 0.2 0.0 - 1.0   Leukocytes,Ua NEGATIVE Negative   Nitrite NEGATIVE Negative   WBC, UA 0-2/hpf 0-2/hpf   RBC / HPF 0-2/hpf 0-2/hpf   Squamous Epithelial / HPF Few(5-10/hpf) (A) Rare(0-4/hpf)   Bacteria, UA Rare(<10/hpf) (A) None  CBC with Differential/Platelet  Result Value Ref Range   WBC 13.3 (H) 4.0 - 10.5 K/uL   RBC 4.18 3.87 - 5.11 Mil/uL   Hemoglobin 12.5 12.0 - 15.0 g/dL   HCT 16.1 09.6 - 04.5 %   MCV 93.1 78.0 - 100.0 fl   MCHC 32.1 30.0 - 36.0 g/dL   RDW 40.9 81.1 - 91.4 %   Platelets 374.0 150.0 - 400.0 K/uL   Neutrophils Relative % 68.3 43.0 - 77.0 %   Lymphocytes Relative 22.4 12.0 - 46.0 %   Monocytes Relative 8.3 3.0 - 12.0 %   Eosinophils Relative 0.5 0.0 - 5.0 %   Basophils Relative 0.5 0.0 - 3.0 %   Neutro Abs 9.1 (H) 1.4 - 7.7 K/uL   Lymphs Abs 3.0 0.7 - 4.0 K/uL   Monocytes Absolute 1.1 (H) 0.1 - 1.0 K/uL   Eosinophils Absolute 0.1 0.0 - 0.7 K/uL   Basophils Absolute 0.1 0.0 - 0.1 K/uL  Comp Met (CMET)  Result Value Ref Range   Sodium 139 135 - 145 mEq/L   Potassium 3.9 3.5 - 5.1 mEq/L   Chloride 101 96 - 112 mEq/L   CO2 28 19 - 32 mEq/L   Glucose, Bld 84 70 -  99 mg/dL   BUN 11 6  - 23 mg/dL   Creatinine, Ser 8.29 0.40 - 1.20 mg/dL   Total Bilirubin 0.2 0.2 - 1.2 mg/dL   Alkaline Phosphatase 72 39 - 117 U/L   AST 19 0 - 37 U/L   ALT 16 0 - 35 U/L   Total Protein 7.9 6.0 - 8.3 g/dL   Albumin 4.8 3.5 - 5.2 g/dL   GFR 562.13 >08.65 mL/min   Calcium 9.9 8.4 - 10.5 mg/dL  POCT Urinalysis Dipstick  Result Value Ref Range   Color, UA yellow    Clarity, UA cloudy    Glucose, UA Negative Negative   Bilirubin, UA Negative    Ketones, UA Negative    Spec Grav, UA 1.020 1.010 - 1.025   Blood, UA Negative    pH, UA 6.0 5.0 - 8.0   Protein, UA Negative Negative   Urobilinogen, UA 0.2 0.2 or 1.0 E.U./dL   Nitrite, UA Negative    Leukocytes, UA Negative Negative   Appearance     Odor    POCT glucose (manual entry)  Result Value Ref Range   POC Glucose 107 (A) 70 - 99 mg/dl  Troponin I (High Sensitivity)  Result Value Ref Range   High Sens Troponin I 4 2 - 17 ng/L    Office Visit on 02/22/2023  Component Date Value   D-Dimer, Quant 02/22/2023 0.29    Troponin I 02/22/2023 <3    Color, Urine 02/22/2023 YELLOW    APPearance 02/22/2023 CLEAR    Specific Gravity, Urine 02/22/2023 1.015    pH 02/22/2023 6.0    Total Protein, Urine 02/22/2023 NEGATIVE    Urine Glucose 02/22/2023 NEGATIVE    Ketones, ur 02/22/2023 NEGATIVE    Bilirubin Urine 02/22/2023 NEGATIVE    Hgb urine dipstick 02/22/2023 NEGATIVE    Urobilinogen, UA 02/22/2023 0.2    Leukocytes,Ua 02/22/2023 NEGATIVE    Nitrite 02/22/2023 NEGATIVE    WBC, UA 02/22/2023 0-2/hpf    RBC / HPF 02/22/2023 0-2/hpf    Squamous Epithelial / HPF 02/22/2023 Few(5-10/hpf) (A)    Bacteria, UA 02/22/2023 Rare(<10/hpf) (A)    WBC 02/22/2023 13.3 (H)    RBC 02/22/2023 4.18    Hemoglobin 02/22/2023 12.5    HCT 02/22/2023 38.9    MCV 02/22/2023 93.1    MCHC 02/22/2023 32.1    RDW 02/22/2023 13.5    Platelets 02/22/2023 374.0    Neutrophils Relative % 02/22/2023 68.3    Lymphocytes Relative 02/22/2023 22.4     Monocytes Relative 02/22/2023 8.3    Eosinophils Relative 02/22/2023 0.5    Basophils Relative 02/22/2023 0.5    Neutro Abs 02/22/2023 9.1 (H)    Lymphs Abs 02/22/2023 3.0    Monocytes Absolute 02/22/2023 1.1 (H)    Eosinophils Absolute 02/22/2023 0.1    Basophils Absolute 02/22/2023 0.1    Sodium 02/22/2023 139    Potassium 02/22/2023 3.9    Chloride 02/22/2023 101    CO2 02/22/2023 28    Glucose, Bld 02/22/2023 84    BUN 02/22/2023 11    Creatinine, Ser 02/22/2023 0.55    Total Bilirubin 02/22/2023 0.2    Alkaline Phosphatase 02/22/2023 72    AST 02/22/2023 19    ALT 02/22/2023 16    Total Protein 02/22/2023 7.9    Albumin 02/22/2023 4.8    GFR 02/22/2023 109.19    Calcium 02/22/2023 9.9    Color, UA 02/22/2023 yellow    Clarity, UA 02/22/2023 cloudy  Glucose, UA 02/22/2023 Negative    Bilirubin, UA 02/22/2023 Negative    Ketones, UA 02/22/2023 Negative    Spec Grav, UA 02/22/2023 1.020    Blood, UA 02/22/2023 Negative    pH, UA 02/22/2023 6.0    Protein, UA 02/22/2023 Negative    Urobilinogen, UA 02/22/2023 0.2    Nitrite, UA 02/22/2023 Negative    Leukocytes, UA 02/22/2023 Negative    High Sens Troponin I 02/22/2023 4    POC Glucose 02/22/2023 107 (A)   Admission on 02/16/2023, Discharged on 02/16/2023  Component Date Value   WBC 02/16/2023 13.6 (H)    RBC 02/16/2023 3.55 (L)    Hemoglobin 02/16/2023 11.0 (L)    HCT 02/16/2023 33.0 (L)    MCV 02/16/2023 93.0    MCH 02/16/2023 31.0    MCHC 02/16/2023 33.3    RDW 02/16/2023 12.5    Platelets 02/16/2023 250    nRBC 02/16/2023 0.0    Color, Urine 02/16/2023 YELLOW (A)    APPearance 02/16/2023 CLOUDY (A)    Specific Gravity, Urine 02/16/2023 1.016    pH 02/16/2023 5.0    Glucose, UA 02/16/2023 NEGATIVE    Hgb urine dipstick 02/16/2023 NEGATIVE    Bilirubin Urine 02/16/2023 NEGATIVE    Ketones, ur 02/16/2023 NEGATIVE    Protein, ur 02/16/2023 NEGATIVE    Nitrite 02/16/2023 NEGATIVE    Leukocytes,Ua  02/16/2023 TRACE (A)    RBC / HPF 02/16/2023 0-5    WBC, UA 02/16/2023 6-10    Bacteria, UA 02/16/2023 RARE (A)    Squamous Epithelial / HPF 02/16/2023 21-50    Mucus 02/16/2023 PRESENT    Troponin I (High Sensiti* 02/16/2023 <2    Sodium 02/16/2023 137    Potassium 02/16/2023 3.4 (L)    Chloride 02/16/2023 102    CO2 02/16/2023 24    Glucose, Bld 02/16/2023 132 (H)    BUN 02/16/2023 15    Creatinine, Ser 02/16/2023 0.54    Calcium 02/16/2023 8.8 (L)    Total Protein 02/16/2023 6.9    Albumin 02/16/2023 4.1    AST 02/16/2023 23    ALT 02/16/2023 15    Alkaline Phosphatase 02/16/2023 54    Total Bilirubin 02/16/2023 0.6    GFR, Estimated 02/16/2023 >60    Anion gap 02/16/2023 11    Troponin I (High Sensiti* 02/16/2023 3   Office Visit on 02/11/2023  Component Date Value   Color, UA 02/11/2023 Yellow    Clarity, UA 02/11/2023 clear    Glucose, UA 02/11/2023 Negative    Bilirubin, UA 02/11/2023 negative    Ketones, UA 02/11/2023 15    Spec Grav, UA 02/11/2023 1.025    Blood, UA 02/11/2023 negative    pH, UA 02/11/2023 5.0    Protein, UA 02/11/2023 Negative    Urobilinogen, UA 02/11/2023 0.2    Nitrite, UA 02/11/2023 negative    Leukocytes, UA 02/11/2023 Trace (A)    MICRO NUMBER: 02/11/2023 [id number]    SPECIMEN QUALITY: 02/11/2023 Adequate    Sample Source 02/11/2023 URINE    STATUS: 02/11/2023 FINAL    Result: 02/11/2023                     Value:Mixed genital flora isolated. These superficial bacteria are not indicative of a urinary tract infection. No further organism identification is warranted on this specimen. If clinically indicated, recollect clean-catch, mid-stream urine and transfer  immediately to Urine Culture Transport Tube.    WBC, UA 02/11/2023 0-2/hpf    RBC /  HPF 02/11/2023 0-2/hpf    Mucus, UA 02/11/2023 Presence of (A)    Squamous Epithelial / HPF 02/11/2023 Few(5-10/hpf) (A)    Hyaline Casts, UA 02/11/2023 Presence of (A)   Office Visit on  01/25/2023  Component Date Value   WBC 01/25/2023 11.8 (H)    RBC 01/25/2023 4.02    Hemoglobin 01/25/2023 12.5    HCT 01/25/2023 37.6    MCV 01/25/2023 93.4    MCHC 01/25/2023 33.2    RDW 01/25/2023 13.8    Platelets 01/25/2023 375.0    Neutrophils Relative % 01/25/2023 64.2    Lymphocytes Relative 01/25/2023 27.2    Monocytes Relative 01/25/2023 6.8    Eosinophils Relative 01/25/2023 1.1    Basophils Relative 01/25/2023 0.7    Neutro Abs 01/25/2023 7.6    Lymphs Abs 01/25/2023 3.2    Monocytes Absolute 01/25/2023 0.8    Eosinophils Absolute 01/25/2023 0.1    Basophils Absolute 01/25/2023 0.1    Sodium 01/25/2023 140    Potassium 01/25/2023 3.4 (L)    Chloride 01/25/2023 102    CO2 01/25/2023 24    Glucose, Bld 01/25/2023 153 (H)    BUN 01/25/2023 14    Creatinine, Ser 01/25/2023 0.62    Total Bilirubin 01/25/2023 0.2    Alkaline Phosphatase 01/25/2023 66    AST 01/25/2023 20    ALT 01/25/2023 18    Total Protein 01/25/2023 7.9    Albumin 01/25/2023 4.9    GFR 01/25/2023 106.14    Calcium 01/25/2023 9.8    Hgb A1c MFr Bld 01/25/2023 5.8    Vitamin B-12 01/25/2023 411    HAV 1 IGG,TYPE SPECIFIC * 01/25/2023 27.60 (H)    HSV 2 IGG,TYPE SPECIFIC * 01/25/2023 <0.90    RPR Ser Ql 01/25/2023 NON-REACTIVE    Hepatitis B Surface Ag 01/25/2023 Negative    Hep B E Ag 01/25/2023 Negative    Hep B C IgM 01/25/2023 Negative    Hep B Core Total Ab 01/25/2023 Negative    Hep B E Ab 01/25/2023 Non Reactive    Hep B Surface Ab, Qual 01/25/2023 Non Reactive    Hep C Virus Ab 01/25/2023 Non Reactive    HIV Screen 4th Generatio* 01/25/2023 Non Reactive    Neisseria Gonorrhea 01/25/2023 Negative    Chlamydia 01/25/2023 Negative    Trichomonas 01/25/2023 Negative    Bacterial Vaginitis (gar* 01/25/2023 Negative    Candida Vaginitis 01/25/2023 Positive (A)    Candida Glabrata 01/25/2023 Negative    Comment 01/25/2023 Normal Reference Range Candida Species - Negative    Comment  01/25/2023 Normal Reference Range Candida Galbrata - Negative    Comment 01/25/2023 Normal Reference Range Trichomonas - Negative    Comment 01/25/2023 Normal Reference Ranger Chlamydia - Negative    Comment 01/25/2023 Normal Reference Range Neisseria Gonorrhea - Negative    Comment 01/25/2023 Normal Reference Range Bacterial Vaginosis - Negative    Cholesterol 01/25/2023 255 (H)    Triglycerides 01/25/2023 258.0 (H)    HDL 01/25/2023 81.50    VLDL 01/25/2023 51.6 (H)    Total CHOL/HDL Ratio 01/25/2023 3    NonHDL 01/25/2023 173.08    TSH 01/25/2023 0.82    Direct LDL 01/25/2023 160.0   Office Visit on 12/29/2022  Component Date Value   Color, UA 12/29/2022 yellow    Clarity, UA 12/29/2022 clear    Glucose, UA 12/29/2022 Negative    Bilirubin, UA 12/29/2022 Negative    Ketones, UA 12/29/2022 Negative    Spec Grav, UA 12/29/2022  1.020    Blood, UA 12/29/2022 Negative    pH, UA 12/29/2022 6.0    Protein, UA 12/29/2022 Negative    Urobilinogen, UA 12/29/2022 0.2    Nitrite, UA 12/29/2022 Negative    Leukocytes, UA 12/29/2022 Negative   Video Visit on 11/11/2022  Component Date Value   Neisseria Gonorrhea 11/11/2022 Negative    Chlamydia 11/11/2022 Negative    Trichomonas 11/11/2022 Negative    Bacterial Vaginitis (gar* 11/11/2022 Positive (A)    Candida Vaginitis 11/11/2022 Negative    Candida Glabrata 11/11/2022 Negative    Comment 11/11/2022 Normal Reference Range Bacterial Vaginosis - Negative    Comment 11/11/2022 Normal Reference Range Candida Species - Negative    Comment 11/11/2022 Normal Reference Range Candida Galbrata - Negative    Comment 11/11/2022 Normal Reference Range Trichomonas - Negative    Comment 11/11/2022 Normal Reference Ranger Chlamydia - Negative    Comment 11/11/2022 Normal Reference Range Neisseria Gonorrhea - Negative   Office Visit on 10/26/2022  Component Date Value   Color, UA 10/26/2022 yellow    Clarity, UA 10/26/2022 clear    Glucose, UA  10/26/2022 Negative    Bilirubin, UA 10/26/2022 negative    Ketones, UA 10/26/2022 positive    Spec Grav, UA 10/26/2022 >=1.030 (A)    Blood, UA 10/26/2022 negative    pH, UA 10/26/2022 5.0    Protein, UA 10/26/2022 Negative    Urobilinogen, UA 10/26/2022 0.2    Nitrite, UA 10/26/2022 negative    Leukocytes, UA 10/26/2022 Trace (A)    MICRO NUMBER: 10/26/2022 [id number]    SPECIMEN QUALITY: 10/26/2022 Adequate    Sample Source 10/26/2022 URINE    STATUS: 10/26/2022 FINAL    Result: 10/26/2022 No Growth    Neisseria Gonorrhea 10/26/2022 Negative    Chlamydia 10/26/2022 Negative    Trichomonas 10/26/2022 Negative    Bacterial Vaginitis-Urine 10/26/2022 Negative    Candida Urine 10/26/2022 Negative    Molecular Comment 10/26/2022 For tests bacteria and/or candida, this specimen does not meet the    Molecular Comment 10/26/2022 strict criteria set by the FDA. The result interpretation should be    Molecular Comment 10/26/2022 considered in conjunction with the patient's clinical history.    Comment 10/26/2022 Normal Reference Ranger Chlamydia - Negative    Comment 10/26/2022 Normal Reference Range Neisseria Gonorrhea - Negative    Comment 10/26/2022 Normal Reference Range Trichomonas - Negative    WBC, UA 10/26/2022 0-2/hpf    RBC / HPF 10/26/2022 none seen    Squamous Epithelial / HPF 10/26/2022 Rare(0-4/hpf)    Uric Acid Crys, UA 10/26/2022 Presence of (A)    Amorphous 10/26/2022 Present (A)   Office Visit on 08/17/2022  Component Date Value   Vitamin B-12 08/18/2022 592    Glucose, Bld 08/18/2022 84    BUN 08/18/2022 19    Creat 08/18/2022 0.59    BUN/Creatinine Ratio 08/18/2022 SEE NOTE:    Sodium 08/18/2022 141    Potassium 08/18/2022 3.7    Chloride 08/18/2022 105    CO2 08/18/2022 23    Calcium 08/18/2022 9.8    Total Protein 08/18/2022 7.7    Albumin 08/18/2022 5.0    Globulin 08/18/2022 2.7    AG Ratio 08/18/2022 1.9    Total Bilirubin 08/18/2022 0.3    Alkaline  phosphatase (AP* 08/18/2022 77    AST 08/18/2022 17    ALT 08/18/2022 11    WBC 08/18/2022 9.3    RBC 08/18/2022 4.01    Hemoglobin 08/18/2022 12.2  HCT 08/18/2022 35.4    MCV 08/18/2022 88.3    MCH 08/18/2022 30.4    MCHC 08/18/2022 34.5    RDW 08/18/2022 12.5    Platelets 08/18/2022 314    MPV 08/18/2022 11.3    Neutro Abs 08/18/2022 4,371    Lymphs Abs 08/18/2022 3,906 (H)    Absolute Monocytes 08/18/2022 688    Eosinophils Absolute 08/18/2022 233    Basophils Absolute 08/18/2022 102    Neutrophils Relative % 08/18/2022 47    Total Lymphocyte 08/18/2022 42.0    Monocytes Relative 08/18/2022 7.4    Eosinophils Relative 08/18/2022 2.5    Basophils Relative 08/18/2022 1.1    D-Dimer, Quant 08/18/2022 0.22   Office Visit on 08/06/2022  Component Date Value   Color, UA 08/06/2022 cloudy    Clarity, UA 08/06/2022 yellow    Glucose, UA 08/06/2022 Negative    Bilirubin, UA 08/06/2022 Negative    Ketones, UA 08/06/2022 Negative    Spec Grav, UA 08/06/2022 1.025    Blood, UA 08/06/2022 3+    pH, UA 08/06/2022 6.0    Protein, UA 08/06/2022 Positive (A)    Urobilinogen, UA 08/06/2022 0.2    Nitrite, UA 08/06/2022 Negative    Leukocytes, UA 08/06/2022 Moderate (2+) (A)   Office Visit on 08/04/2022  Component Date Value   Color, UA 08/04/2022 YELLOW    Glucose, UA 08/04/2022 Negative    Bilirubin, UA 08/04/2022 NEG    Ketones, UA 08/04/2022 NEG    Spec Grav, UA 08/04/2022 1.025    Blood, UA 08/04/2022 POSITIVE    pH, UA 08/04/2022 6.0    Protein, UA 08/04/2022 Positive (A)    Urobilinogen, UA 08/04/2022 0.2    Nitrite, UA 08/04/2022 NEG    Leukocytes, UA 08/04/2022 Moderate (2+) (A)    MICRO NUMBER: 08/05/2022 [id number]    SPECIMEN QUALITY: 08/05/2022 Adequate    Sample Source 08/05/2022 URINE    STATUS: 08/05/2022 FINAL    ISOLATE 1: 08/05/2022 ESBL Escherichia coli (A)   There may be more visits with results that are not included.   No image results found.   MR  Lumbar Spine Wo Contrast  Result Date: 01/15/2023 CLINICAL DATA:  Low back pain radiating to the right hip and leg for the past 4 months. No injury or prior surgery. EXAM: MRI LUMBAR SPINE WITHOUT CONTRAST TECHNIQUE: Multiplanar, multisequence MR imaging of the lumbar spine was performed. No intravenous contrast was administered. COMPARISON:  MRI lumbar spine dated November 15, 2018. FINDINGS: Segmentation:  Standard. Alignment: Mild dextrocurvature of the upper lumbar spine. No listhesis. Vertebrae:  No fracture, evidence of discitis, or bone lesion. Conus medullaris and cauda equina: Conus extends to the L1-L2 level. Conus and cauda equina appear normal. 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 Otherwise negative. Disc levels: T12-L1 to L3-L4:  Negative. L4-L5: Negative disc. Progressive mild bilateral facet arthropathy. No stenosis. L5-S1: Negative disc. Progressive mild bilateral facet arthropathy. No stenosis. IMPRESSION: 1. Progressive mild bilateral facet arthropathy at L4-L5 and L5-S1. No stenosis or impingement. Electronically Signed   By: Obie Dredge M.D.   On: 01/15/2023 08:34    No results found.     Additional Info: This encounter employed real-time, collaborative documentation. The patient actively reviewed and updated their medical record on a shared screen, ensuring transparency and facilitating joint problem-solving for the problem list, overview, and plan. This approach promotes accurate, informed care. The treatment plan was discussed and reviewed in detail, including medication safety, potential  side effects, and all patient questions. We confirmed understanding and comfort with the plan. Follow-up instructions were established, including contacting the office for any concerns, returning if symptoms worsen, persist, or new symptoms develop, and precautions for potential emergency department visits.

## 2023-02-22 NOTE — Telephone Encounter (Signed)
Patient saw Dr. Jon Billings today for an follow-up visit.

## 2023-02-22 NOTE — Assessment & Plan Note (Addendum)
Advised the patient that the most likely cause of her tachycardia and shortness of breath now that we have worked for little pain fruit is blood clot of the lungs I encouraged her to return to the ER but she side and so instead I went ahead and ordered a D-dimer to help decide about  a CT angiogram chest pulmonary embolism rule out.  Emergency room did not perform this (notes reviewed) but did note she did not report pleuritic pain. However she does report mild shortness of breath today.  Persistent high heart rate suggests possible AFib or multifocal atrial tachycardia, as indicated by EKG findings. She feels lightheaded and describes a "weird" sensation on the current Propranolol dose, with a noted low blood pressure during a recent ER visit. We will order a Holter monitor for further evaluation and check troponin levels to assess for cardiac strain. Adjusting the Propranolol dose will be considered pending the results of the workup.  She has not had any signs of infection such as a cough or fever but she has not had a chest x-ray or CAT scan of the chest to look for the cause of the tachycardia so I think this is the next step since it is persisting despite propranolol and even associated with mild shortness of breath

## 2023-02-22 NOTE — Patient Outreach (Signed)
Medicaid Managed Care Social Work Note  02/22/2023 Name:  Laurie Roman MRN:  [id number] DOB:  04-23-1976  Laurie Roman is an 47 y.o. year old female who is a primary patient of Jeani Sow, MD.  The Medicaid Managed Care Coordination team was consulted for assistance with:  Community Resources   Ms. Feggins was given information about Medicaid Managed Care Coordination team services today. Laurie Roman Patient agreed to services and verbal consent obtained.  Engaged with patient  for by telephone forinitial visit in response to referral for case management and/or care coordination services.   Assessments/Interventions:  Review of past medical history, allergies, medications, health status, including review of consultants reports, laboratory and other test data, was performed as part of comprehensive evaluation and provision of chronic care management services.  SDOH: (Social Determinant of Health) assessments and interventions performed: SDOH Interventions    Flowsheet Row Office Visit from 12/14/2022 in [facility] from 07/23/2022 in [facility] from 04/10/2022 in [facility] Video Visit from 09/11/2021 in [facility] Video Visit from 12/12/2020 in [facility] Office Visit from 05/16/2020 in [facility]  SDOH Interventions        Depression Interventions/Treatment  Currently on Treatment Currently on Treatment, Counseling Medication, Counseling Medication, Currently on Treatment Medication, Currently on Treatment Patient refuses Treatment     BSW completed a telephone outreach with patient. She states she is working for [employer]. Her husband past away 4 years ago and she is having a hard time transferring the loan into her name. Her father is living with her and her utility bill keeps going up. BSW and patient agreed to  send a list of resources that can help with the utility bill as well has some additional resources. Resources will be emailed to [email or web address].  Advanced Directives Status:  Not addressed in this encounter.  Care Plan                 Allergies  Allergen Reactions   Cefazolin Hives    Only IV formulation can safely take by mouth keflex   Ondansetron Hcl Hives   Sulfa Antibiotics Rash   Tramadol Hcl     Other reaction(s): chest pain   Emgality [Galcanezumab-Gnlm]    Macrobid [Nitrofurantoin] Itching    Red welts.   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   Flank pain 10/27/2022   Urinary incontinence 10/26/2022   B12 deficiency 10/05/2022   Opioid dependence (HCC) 08/21/2022   Chronic dental pain 06/29/2022   Cervicalgia 03/27/2022   Depression, major, single episode, severe (HCC) 05/16/2020   Primary insomnia 09/15/2019   Anxiety    Maxillary sinusitis 12/02/2018   Asthma 03/27/2018   Controlled substance agreement signed 10/13/2017   Migraine 08/21/2017   ADHD 08/21/2017   Essential hypertension 08/21/2017   Mild obstructive sleep apnea 02/18/2017   Left sided sciatica 09/27/2015   Gastroesophageal reflux disease without esophagitis 05/02/2014  Conditions to be addressed/monitored per PCP order:   utility resources  There are no care plans that you recently modified to display for this patient.   Follow up:  Patient agrees to Care Plan and Follow-up.  Plan: The Managed Medicaid care management team will reach out to the patient again over the  next 30 days.  Date/time of next scheduled Social Work care management/care coordination outreach:  03/24/23  Gus Puma, Kenard Gower, [facility]  Managed [facility] Social Worker [phone]

## 2023-02-22 NOTE — Assessment & Plan Note (Signed)
This is mild currently and I don't think its a major contributor to the tachycardia although she is clearly very concerned about it and has been to the emergency room about it and the low blood pressure. I believe discerning the cause will resolve the anxiety.

## 2023-02-22 NOTE — Assessment & Plan Note (Signed)
And patient will recommend injection

## 2023-02-22 NOTE — Patient Instructions (Signed)
Visit Information  Laurie Roman was given information about Medicaid Managed Care team care coordination services as a part of their [facility] Medicaid benefit. [name] verbally consented to engagement with the [facility] Managed Care team.   If you are experiencing a medical emergency, please call 911 or report to your local emergency department or urgent care.   If you have a non-emergency medical problem during routine business hours, please contact your provider's office and ask to speak with a nurse.   For questions related to your [facility], please call: [phone] or visit the homepage here: [email or web address]  If you would like to schedule transportation through your [facility], please call the following number at least 2 days in advance of your appointment: [phone]   Rides for urgent appointments can also be made after hours by calling Member Services.  Call the Behavioral Health Crisis Line at [phone], at any time, 24 hours a day, 7 days a week. If you are in danger or need immediate medical attention call 911.  If you would like help to quit smoking, call 1-800-QUIT-NOW (920-813-4987) OR Espaol: 1-855-Djelo-Ya (1-324-401-0272) o para ms informacin haga clic aqu or Text READY to 536-644 to register via text  Ms. Marik - following are the goals we discussed in your visit today:   Goals Addressed   None     Social Worker will follow up on 03/24/23.   Gus Puma, Kenard Gower, MHA [facility]  Managed Medicaid Social Worker [phone]   Following is a copy of your plan of care:  There are no care plans that you recently modified to display for this patient.

## 2023-02-22 NOTE — Patient Instructions (Addendum)
VISIT SUMMARY:  During your visit, we discussed your concerns about your heart, including your elevated heart rate and the side effects of your current medication, Propranolol. We also discussed your recent urinary tract infection (UTI) and ongoing urinary symptoms, as well as your high blood pressure. We have outlined a plan to address each of these issues and will adjust your medication regimen as necessary.  YOUR PLAN:  -ELEVATED HEART RATE: Your high heart rate could be due to a condition called atrial fibrillation or multifocal atrial tachycardia. We will use a device called a Holter monitor to track your heart rate and rhythm over time. We will also check a protein in your blood called troponin to see if your heart is under strain. Depending on the results, we may adjust your Propranolol dose.  -SHORTNESS OF BREATH: Your new symptom of shortness of breath could be due to a blood clot in your lungs, also known as a pulmonary embolism. We will do a blood test called a D-dimer test, and if it's positive, we will do a CT scan of your chest to look for clots.  -URINARY TRACT INFECTION: You still have symptoms of a UTI after finishing your antibiotics. We will do a urine test to check for infection. If the test is positive, we will start you on a different antibiotic.  -HIGH BLOOD PRESSURE: You have reported high blood pressure readings at home. Please continue to monitor your blood pressure at home and let us know if it remains high.  We won't make changes based n today's readings.  -MEDICATION MANAGEMENT: We will adjust your medications as needed based on your symptoms and the results of your tests. We will change your Abilify in the chart to as needed and renew your prescriptions for amphetamines and hydrocodone.  INSTRUCTIONS:  Please continue to monitor your heart rate and blood pressure at home. If you notice any changes in your symptoms or if your symptoms worsen, please contact us  immediately. We will be in touch with you once we have the results of your tests to discuss the next steps in your treatment plan.  They should call about the heart monitor.  Go straight to ER if any worsening of condition.   It was a pleasure seeing you today! Your health and satisfaction are our top priorities.  Laurie Hew, MD  Your Providers PCP: Jeani Sow, MD,  [phone]) Referring Provider: Jeani Sow, MD,  [phone])     NEXT STEPS: [x]  Early Intervention: Schedule sooner appointment, call our on-call services, or go to emergency room if there is any significant Increase in pain or discomfort New or worsening symptoms Sudden or severe changes in your health [x]  Flexible Follow-Up: We recommend a No follow-ups on file. for optimal routine care. This allows for progress monitoring and treatment adjustments. [x]  Preventive Care: Schedule your annual preventive care visit! It's typically covered by insurance and helps identify potential health issues early. [x]  Lab & X-ray Appointments: Incomplete tests scheduled today, or call to schedule. X-rays: [facility] (M-F, 8:30am-noon or 1pm-5pm). [x]  Medical Information Release: Sign a release form at front desk to obtain relevant medical information we don't have.  MAKING THE MOST OF OUR FOCUSED 20 MINUTE APPOINTMENTS: [x]   Clearly state your top concerns at the beginning of the visit to focus our discussion [x]   If you anticipate you will need more time, please inform the front desk during scheduling - we can book multiple appointments in the  same week. [x]   If you have transportation problems- use our convenient video appointments or ask about transportation support. [x]   We can get down to business faster if you use MyChart to update information before the visit and submit non-urgent questions before your visit. Thank you for taking the time to provide details through MyChart.  Let our nurse know and  she can import this information into your encounter documents.  Arrival and Wait Times: [x]   Arriving on time ensures that everyone receives prompt attention. [x]   Early morning (8a) and afternoon (1p) appointments tend to have shortest wait times. [x]   Unfortunately, we cannot delay appointments for late arrivals or hold slots during phone calls.  Getting Answers and Following Up [x]   Simple Questions & Concerns: For quick questions or basic follow-up after your visit, reach [place] at [phone] or MyChart messaging. [x]   Complex Concerns: If your concern is more complex, scheduling an appointment might be best. Discuss this with the staff to find the most suitable option. [x]   Lab & Imaging Results: We'll contact you directly if results are abnormal or you don't use MyChart. Most normal results will be on MyChart within 2-3 business days, with a review message from Dr. Jon Billings. Haven't heard back in 2 weeks? Need results sooner? Contact us at [phone]. [x]   Referrals: Our referral coordinator will manage specialist referrals. The specialist's office should contact you within 2 weeks to schedule an appointment. Call us if you haven't heard from them after 2 weeks.  Staying Connected [x]   MyChart: Activate your MyChart for the fastest way to access results and message [place]. See the last page of this paperwork for instructions on how to activate.  Bring to Your Next Appointment [x]   Medications: Please bring all your medication bottles to your next appointment to ensure we have an accurate record of your prescriptions. [x]   Health Diaries: If you're monitoring any health conditions at home, keeping a diary of your readings can be very helpful for discussions at your next appointment.  Billing [x]   X-ray & Lab Orders: These are billed by separate companies. Contact the invoicing company directly for questions or concerns. [x]   Visit Charges: Discuss any billing inquiries with our  administrative services team.  Your Satisfaction Matters [x]   Share Your Experience: We strive for your satisfaction! If you have any complaints, or preferably compliments, please let Dr. Jon Billings know directly or contact our Practice Administrators, Edwena Felty or [employer], by asking at the front desk.   Reviewing Your Records [x]   Review this early draft of your clinical encounter notes below and the final encounter summary tomorrow on MyChart after its been completed.  All orders placed so far are visible here: Tachycardia Assessment & Plan: Advised the patient that the most likely cause of her tachycardia and shortness of breath now that we have worked for little pain fruit is blood clot of the lungs I encouraged her to return to the ER but she side and so instead I went ahead and ordered a CT angiogram of her chest to look for you and to determine testing  Persistent high heart rate suggests possible AFib or multifocal atrial tachycardia, as indicated by EKG findings. She feels lightheaded and describes a "weird" sensation on the current Propranolol dose, with a noted low blood pressure during a recent ER visit. We will order a Holter monitor for further evaluation and check troponin levels to assess for cardiac strain. Adjusting the Propranolol dose will be  considered pending the results of the workup.  She has not had any signs of infection such as a cough or fever but she has not had a chest x-ray or CAT scan of the chest to look for the cause of the tachycardia so I think this is the next step since it is persisting despite propranolol and even associated with mild shortness of breath  Orders: -     LONG TERM MONITOR (3-14 DAYS); Future -     D-dimer, quantitative -     Troponin I - -     Urinalysis, Routine w reflex microscopic -     CBC with Differential/Platelet -     Comprehensive metabolic panel  Anxiety -     ARIPiprazole; Take 1 tablet (2 mg total) by mouth daily as  needed.  Attention deficit hyperactivity disorder (ADHD), combined type -     Amphetamine-Dextroamphetamine; Take 1 tablet (20 mg total) by mouth 2 (two) times daily.  Dispense: 60 tablet; Refill: 0  Right sided sciatica Assessment & Plan: And patient will recommend injection  Orders: -     HYDROcodone-Acetaminophen; Take 1 tablet by mouth every 8 (eight) hours as needed.  Dispense: 60 tablet; Refill: 0  Shortness of breath -     LONG TERM MONITOR (3-14 DAYS); Future -     D-dimer, quantitative -     Troponin I - -     Urinalysis, Routine w reflex microscopic -     CBC with Differential/Platelet -     Comprehensive metabolic panel  Urinary tract infection without hematuria, site unspecified -     POCT urinalysis dipstick  Essential hypertension Assessment & Plan: She has reported high blood pressure readings at home. We advise her to continue monitoring her blood pressure at home.   Other elevated white blood cell (WBC) count  Hypokalemia

## 2023-02-23 ENCOUNTER — Ambulatory Visit (INDEPENDENT_AMBULATORY_CARE_PROVIDER_SITE_OTHER): Payer: Medicaid Other | Admitting: *Deleted

## 2023-02-23 DIAGNOSIS — E538 Deficiency of other specified B group vitamins: Secondary | ICD-10-CM

## 2023-02-23 MED ORDER — CYANOCOBALAMIN 1000 MCG/ML IJ SOLN
1000.0000 ug | Freq: Once | INTRAMUSCULAR | Status: AC
Start: 2023-02-23 — End: 2023-02-23
  Administered 2023-02-23: 1000 ug via INTRAMUSCULAR

## 2023-02-23 NOTE — Progress Notes (Signed)
Per orders of Dr. Ruthine Dose, injection of B 12 given in left deltoid per patient preference by Jobe Gibbon, CMA. Patient tolerated injection well. Patient reminded to schedule next injection.

## 2023-02-24 ENCOUNTER — Telehealth: Payer: Self-pay | Admitting: *Deleted

## 2023-02-24 ENCOUNTER — Other Ambulatory Visit: Payer: Self-pay | Admitting: Internal Medicine

## 2023-02-24 DIAGNOSIS — I10 Essential (primary) hypertension: Secondary | ICD-10-CM

## 2023-02-24 DIAGNOSIS — E876 Hypokalemia: Secondary | ICD-10-CM

## 2023-02-24 DIAGNOSIS — F902 Attention-deficit hyperactivity disorder, combined type: Secondary | ICD-10-CM

## 2023-02-24 DIAGNOSIS — N390 Urinary tract infection, site not specified: Secondary | ICD-10-CM

## 2023-02-24 DIAGNOSIS — R0602 Shortness of breath: Secondary | ICD-10-CM

## 2023-02-24 DIAGNOSIS — M5431 Sciatica, right side: Secondary | ICD-10-CM

## 2023-02-24 DIAGNOSIS — D72828 Other elevated white blood cell count: Secondary | ICD-10-CM

## 2023-02-24 DIAGNOSIS — F419 Anxiety disorder, unspecified: Secondary | ICD-10-CM

## 2023-02-24 DIAGNOSIS — R7309 Other abnormal glucose: Secondary | ICD-10-CM

## 2023-02-24 DIAGNOSIS — R000 Tachycardia, unspecified: Secondary | ICD-10-CM

## 2023-02-24 NOTE — Telephone Encounter (Signed)
Order corrected and changed to be done by [facility] office, read my Dr. Anne Fu. Patient scheduled to have monitor applied in office 02/25/23, 12:30 PM at the [facility] location.

## 2023-02-24 NOTE — Telephone Encounter (Signed)
-----   Message from April H sent at 02/24/2023 11:49 AM EDT ----- Patient requesting call back in regards to monitor.

## 2023-02-25 ENCOUNTER — Ambulatory Visit: Payer: Medicaid Other | Attending: Internal Medicine

## 2023-02-25 DIAGNOSIS — R0602 Shortness of breath: Secondary | ICD-10-CM

## 2023-02-25 DIAGNOSIS — R000 Tachycardia, unspecified: Secondary | ICD-10-CM | POA: Diagnosis not present

## 2023-02-25 NOTE — Progress Notes (Unsigned)
ZIO XT serial # [id number] from office inventory applied to patient.

## 2023-02-26 ENCOUNTER — Ambulatory Visit (INDEPENDENT_AMBULATORY_CARE_PROVIDER_SITE_OTHER): Payer: Medicaid Other | Admitting: Internal Medicine

## 2023-02-26 ENCOUNTER — Encounter: Payer: Self-pay | Admitting: Internal Medicine

## 2023-02-26 VITALS — BP 140/87 | HR 90 | Temp 98.2°F | Resp 18 | Ht 59.0 in | Wt 147.0 lb

## 2023-02-26 DIAGNOSIS — R0781 Pleurodynia: Secondary | ICD-10-CM | POA: Diagnosis not present

## 2023-02-26 DIAGNOSIS — R8290 Unspecified abnormal findings in urine: Secondary | ICD-10-CM

## 2023-02-26 DIAGNOSIS — I10 Essential (primary) hypertension: Secondary | ICD-10-CM

## 2023-02-26 DIAGNOSIS — M60009 Infective myositis, unspecified site: Secondary | ICD-10-CM | POA: Diagnosis not present

## 2023-02-26 DIAGNOSIS — R0789 Other chest pain: Secondary | ICD-10-CM

## 2023-02-26 DIAGNOSIS — R1011 Right upper quadrant pain: Secondary | ICD-10-CM | POA: Diagnosis not present

## 2023-02-26 DIAGNOSIS — B3731 Acute candidiasis of vulva and vagina: Secondary | ICD-10-CM

## 2023-02-26 LAB — POC URINALSYSI DIPSTICK (AUTOMATED)
Bilirubin, UA: NEGATIVE
Blood, UA: NEGATIVE
Glucose, UA: NEGATIVE
Ketones, UA: NEGATIVE
Leukocytes, UA: NEGATIVE
Nitrite, UA: POSITIVE
Protein, UA: NEGATIVE
Spec Grav, UA: 1.02 (ref 1.010–1.025)
Urobilinogen, UA: 0.2 E.U./dL
pH, UA: 6 (ref 5.0–8.0)

## 2023-02-26 MED ORDER — FLUCONAZOLE 150 MG PO TABS
150.0000 mg | ORAL_TABLET | ORAL | 0 refills | Status: DC | PRN
Start: 2023-02-26 — End: 2023-05-18

## 2023-02-26 MED ORDER — DOXYCYCLINE HYCLATE 100 MG PO TABS
100.0000 mg | ORAL_TABLET | Freq: Two times a day (BID) | ORAL | 0 refills | Status: AC
Start: 2023-02-26 — End: ?

## 2023-02-26 NOTE — Progress Notes (Unsigned)
[facility]: [phone]   -- Medical Office Visit --  Patient:  Laurie Roman      Age: 47 y.o.       Sex:  female  Date:   02/26/2023 Patient Care Team: Jeani Sow, MD as PCP - General (Family Medicine) Shaune Leeks as Social Worker Today's Healthcare Provider: [name], MD      Assessment & Plan Other chest pain With exquisite tenderness, not so much on the right lateral/right upper quadrant.  This is noncardiac chest pain but I completed an EKG per patient request EKG showed marked improvement when compared to recent September 10 EKG Exquisite tenderness in the right upper chest, worse with deep inspiration and lying down. No radiation to the back. Recent history of intense scratching due to an allergic reaction. High white blood cell count suggestive of an infection. Differential diagnosis includes infective myositis and musculoskeletal pain. -Order chest X-ray to rule out aortic dissection. -Perform EKG to evaluate cardiac function. -Start Doxycycline for presumed infective myositis.  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 Abnormal urine odor She fixates on this but it seems due to increased muscle breakdown and she has protein in the urine I believe it fits best with the theory of myositis I propose for symptoms Pleurodynia We have a negative D-dimer and a another possible explanation and reproducibility to the chest pain and I do not think CT angiogram to rule out pulmonary embolism is necessary at this time but we will obtain chest x-ray per patient request Infective myositis, unspecified site Feeling the best explanation for the heart rate variability the blood pressure dropping low last week the persistently elevated white blood cell count and today's complaint of chest and abdominal tenderness is that there is probably a superficial cellulitic process or myositis process in the chest wall  and abdominal wall and she even reports that she did scratch and break her skin with deep scratching not long ago in these areas.  Therefore were going to try an antibiotic for presumed infective myositis as this is the best explanation for all of her symptoms.   She would rather avoid blood draw due to recently doing and very very difficult stick but she does want EKG and did recent negative blood draws for D-dimer and troponin. RUQ pain More lateral than right upper quadrant of abdomen since last Tuesday,  Less severe than chest pain, possibly related to musculoskeletal issues or known kidney stones. -Consider ultrasound gallbladder if symptoms persist after antibiotic treatment. No rebound and minimal sensitivity to moderate right upper quadrant of abdomen palpation but we discussed acute cholecystitis. Essential hypertension Patient reports taking a quarter of her beta blocker medication due to fatigue side effects. -Continue close follow-up. Current blood pressure acceptable given illness. No change Vagina, candidiasis Prophylaxis for Antibiotic-Induced Yeast Infection Patient has a history of yeast infections following antibiotic use. -Prescribe Diflucan to prevent yeast infection during antibiotic treatment.     Diagnoses and all orders for this visit: Abnormal urine odor -     POCT Urinalysis Dipstick (Automated) Pleurodynia -     DG Chest 2 View; Future Other chest pain -     EKG 12-Lead Infective myositis, unspecified site -     doxycycline (VIBRA-TABS) 100 MG tablet; Take 1 tablet (100 mg total) by mouth 2 (two) times daily. RUQ pain Vaginal candidiasis -     fluconazole (DIFLUCAN) 150 MG tablet; Take 1 tablet (  150 mg total) by mouth every three (3) days as needed.  Recommended follow-up: No follow-ups on file. Future Appointments  Date Time Provider Department Center  03/24/2023  3:00 PM Shaune Leeks [facility] None  05/03/2023  3:00 PM Jake Bathe, MD [id number]          Subjective   47 y.o. female who has Migraine; ADHD; Essential hypertension; Mild obstructive sleep apnea; Controlled substance agreement signed; Gastroesophageal reflux disease without esophagitis; Left sided sciatica; Asthma; Maxillary sinusitis; Anxiety; Primary insomnia; Depression, major, single episode, severe (HCC); Cervicalgia; Chronic dental pain; Opioid dependence (HCC); B12 deficiency; Urinary incontinence; Flank pain; Uric acid crystalluria; Right sided sciatica; Intertrigo; Chronic bilateral low back pain with sciatica; Chronic right hip pain; and Tachycardia on their problem list. Her reasons/main concerns/chief complaints for today's office visit are Follow-up (Follow-up on pain on right side and kidney infection, urine still has odor)   ------------------------------------------------------------------------------------------------------------------------ AI-Extracted: Discussed the use of AI scribe software for clinical note transcription with the patient, who gave verbal consent to proceed.  History of Present Illness   The patient, a 47 year old with a history of blood pressure and heart rate instabilities, presents with chest pain and right abdominal pain. The chest pain is described as different from her usual experience with pain and is triggered by deep inspiration. The pain is also associated with tenderness to touch, which worsens with lying down and taking a deep breath. The patient denies any memory of pulling a muscle. The patient also reports a recent episode of intense scratching due to an allergic reaction to an antibiotic, which could have led to a possible infection.       She has a past medical history of ADD (attention deficit disorder), Anxiety, Asthma, Blood transfusion without reported diagnosis, Depression, GERD (gastroesophageal reflux disease), IBS (irritable bowel syndrome), Insomnia, Lumbago, Migraine, Opioid dependence (HCC)  (08/21/2022), Other chest pain (08/17/2022), Sciatica, Sleep apnea, Tietze's disease, and Urinary incontinence (10/26/2022).  Problem list overviews that were updated at today's visit: No problems updated. Current Outpatient Medications on File Prior to Visit  Medication Sig   albuterol (VENTOLIN HFA) 108 (90 Base) MCG/ACT inhaler Inhale 2 puffs into the lungs every 6 (six) hours as needed for wheezing or shortness of breath.   amLODipine (NORVASC) 10 MG tablet Take 1 tablet (10 mg total) by mouth daily. Replaces amlodipine 5, for uncontrolled blood pressure.   amphetamine-dextroamphetamine (ADDERALL) 20 MG tablet Take 1 tablet (20 mg total) by mouth 2 (two) times daily.   ARIPiprazole (ABILIFY) 2 MG tablet Take 1 tablet (2 mg total) by mouth daily as needed.   budesonide-formoterol (SYMBICORT) 160-4.5 MCG/ACT inhaler Inhale 2 puffs into the lungs 2 (two) times daily.   chlorhexidine (PERIDEX) 0.12 % solution Use as directed 15 mLs in the mouth or throat 2 (two) times daily.   doxepin (SINEQUAN) 10 MG capsule Take 1 capsule (10 mg total) by mouth at bedtime.   Erenumab-aooe (AIMOVIG) 140 MG/ML SOAJ Inject 140 mg into the skin every 30 (thirty) days.   escitalopram (LEXAPRO) 20 MG tablet Take 1 tablet (20 mg total) by mouth daily.   fluticasone (FLONASE) 50 MCG/ACT nasal spray Place 1 spray into both nostrils daily.   gabapentin (NEURONTIN) 800 MG tablet Take 1 tablet (800 mg total) by mouth 3 (three) times daily. Replaces prior dosing schedule.   HYDROcodone-acetaminophen (NORCO) 10-325 MG tablet Take 1 tablet by mouth every 8 (eight) hours as needed.   losartan (COZAAR) 100 MG tablet  Take 1 tablet (100 mg total) by mouth daily.   magic mouthwash (nystatin, lidocaine, diphenhydrAMINE, alum & mag hydroxide) suspension Swish and swallow 5 mLs 4 (four) times daily.   metoprolol tartrate (LOPRESSOR) 25 MG tablet Take 1 tablet (25 mg total) by mouth 2 (two) times daily.   nystatin (MYCOSTATIN) 100000  UNIT/ML suspension Take 5 mLs (500,000 Units total) by mouth 4 (four) times daily. Swish and hold in mouth few seconds and swallow   nystatin (MYCOSTATIN/NYSTOP) powder Apply 1 Application topically 3 (three) times daily.   nystatin cream (MYCOSTATIN) Apply 1 Application topically 2 (two) times daily.   nystatin cream (MYCOSTATIN) Apply 1 Application topically 2 (two) times daily.   ondansetron (ZOFRAN) 4 MG tablet Take 1 tablet (4 mg total) by mouth every 8 (eight) hours as needed for nausea or vomiting.   pantoprazole (PROTONIX) 40 MG tablet Take 1 tablet (40 mg total) by mouth daily.   potassium chloride (KLOR-CON M) 20 MEQ tablet Take 1 tablet (20 mEq total) by mouth daily.   rizatriptan (MAXALT) 10 MG tablet Take 1 tablet (10 mg total) by mouth as needed for migraine. May repeat in 2 hours if needed   tiZANidine (ZANAFLEX) 4 MG tablet Take 1 tablet (4 mg total) by mouth every 6 (six) hours as needed for muscle spasms.   Current Facility-Administered Medications on File Prior to Visit  Medication   ondansetron (ZOFRAN-ODT) disintegrating tablet 4 mg   Medications Discontinued During This Encounter  Medication Reason   fluconazole (DIFLUCAN) 150 MG tablet Reorder     Objective   Physical Exam  BP (!) 140/87   Pulse 90   Temp 98.2 F (36.8 C) (Temporal)   Resp 18   Ht 4\' 11"  (1.499 m)   Wt 147 lb (66.7 kg)   SpO2 97%   BMI 29.69 kg/m  Wt Readings from Last 10 Encounters:  02/26/23 147 lb (66.7 kg)  02/22/23 144 lb (65.3 kg)  02/16/23 144 lb 9.6 oz (65.6 kg)  02/16/23 144 lb (65.3 kg)  02/11/23 144 lb 12.8 oz (65.7 kg)  01/27/23 146 lb (66.2 kg)  01/25/23 146 lb 12.8 oz (66.6 kg)  01/01/23 146 lb 9.6 oz (66.5 kg)  12/29/22 146 lb 9.6 oz (66.5 kg)  12/14/22 143 lb 2 oz (64.9 kg)   Vital signs reviewed.  Nursing notes reviewed. Weight trend reviewed. Abnormalities and Problem-Specific physical exam findings:  pain and tenderness to superficial touch right breast/chest- much  less tenderness to right lateral / right upper quadrant of abdomen /right flank tenderness. She reports no skin changes in the area (skin not examined)  General Appearance:  No acute distress appreciable.   Well-groomed, healthy-appearing female.  Well proportioned with no abnormal fat distribution.  Good muscle tone. Pulmonary:  Normal work of breathing at rest, no respiratory distress apparent. SpO2: 97 %  Musculoskeletal: All extremities are intact.  Neurological:  Awake, alert, oriented, and engaged.  No obvious focal neurological deficits or cognitive impairments.  Sensorium seems unclouded.   Speech is clear and coherent with logical content. Psychiatric:  Appropriate mood, pleasant and cooperative demeanor, thoughtful and engaged during the exam  Results   LABS WBC: 13.3 D-dimer: negative        I reviewed and interpreted the EKG for the patient EKG: sinus rhythm , normal  axis, normal intervals, left atrial hypertrophy, no st or t wave changes, there are chronic j wave changes and large p waves unchanged from prior EKG 02/16/2023  Results for orders placed or performed in visit on 02/26/23  POCT Urinalysis Dipstick (Automated)  Result Value Ref Range   Color, UA YELLOW    Clarity, UA CLOUDY    Glucose, UA Negative Negative   Bilirubin, UA NEG    Ketones, UA NEG    Spec Grav, UA 1.020 1.010 - 1.025   Blood, UA NEG    pH, UA 6.0 5.0 - 8.0   Protein, UA Negative Negative   Urobilinogen, UA 0.2 0.2 or 1.0 E.U./dL   Nitrite, UA POS    Leukocytes, UA Negative Negative    Office Visit on 02/26/2023  Component Date Value   Color, UA 02/26/2023 YELLOW    Clarity, UA 02/26/2023 CLOUDY    Glucose, UA 02/26/2023 Negative    Bilirubin, UA 02/26/2023 NEG    Ketones, UA 02/26/2023 NEG    Spec Grav, UA 02/26/2023 1.020    Blood, UA 02/26/2023 NEG    pH, UA 02/26/2023 6.0    Protein, UA 02/26/2023 Negative    Urobilinogen, UA 02/26/2023 0.2    Nitrite, UA 02/26/2023 POS     Leukocytes, UA 02/26/2023 Negative   Office Visit on 02/22/2023  Component Date Value   D-Dimer, Quant 02/22/2023 0.29    Troponin I 02/22/2023 <3    Color, Urine 02/22/2023 YELLOW    APPearance 02/22/2023 CLEAR    Specific Gravity, Urine 02/22/2023 1.015    pH 02/22/2023 6.0    Total Protein, Urine 02/22/2023 NEGATIVE    Urine Glucose 02/22/2023 NEGATIVE    Ketones, ur 02/22/2023 NEGATIVE    Bilirubin Urine 02/22/2023 NEGATIVE    Hgb urine dipstick 02/22/2023 NEGATIVE    Urobilinogen, UA 02/22/2023 0.2    Leukocytes,Ua 02/22/2023 NEGATIVE    Nitrite 02/22/2023 NEGATIVE    WBC, UA 02/22/2023 0-2/hpf    RBC / HPF 02/22/2023 0-2/hpf    Squamous Epithelial / HPF 02/22/2023 Few(5-10/hpf) (A)    Bacteria, UA 02/22/2023 Rare(<10/hpf) (A)    WBC 02/22/2023 13.3 (H)    RBC 02/22/2023 4.18    Hemoglobin 02/22/2023 12.5    HCT 02/22/2023 38.9    MCV 02/22/2023 93.1    MCHC 02/22/2023 32.1    RDW 02/22/2023 13.5    Platelets 02/22/2023 374.0    Neutrophils Relative % 02/22/2023 68.3    Lymphocytes Relative 02/22/2023 22.4    Monocytes Relative 02/22/2023 8.3    Eosinophils Relative 02/22/2023 0.5    Basophils Relative 02/22/2023 0.5    Neutro Abs 02/22/2023 9.1 (H)    Lymphs Abs 02/22/2023 3.0    Monocytes Absolute 02/22/2023 1.1 (H)    Eosinophils Absolute 02/22/2023 0.1    Basophils Absolute 02/22/2023 0.1    Sodium 02/22/2023 139    Potassium 02/22/2023 3.9    Chloride 02/22/2023 101    CO2 02/22/2023 28    Glucose, Bld 02/22/2023 84    BUN 02/22/2023 11    Creatinine, Ser 02/22/2023 0.55    Total Bilirubin 02/22/2023 0.2    Alkaline Phosphatase 02/22/2023 72    AST 02/22/2023 19    ALT 02/22/2023 16    Total Protein 02/22/2023 7.9    Albumin 02/22/2023 4.8    GFR 02/22/2023 109.19    Calcium 02/22/2023 9.9    Color, UA 02/22/2023 yellow    Clarity, UA 02/22/2023 cloudy    Glucose, UA 02/22/2023 Negative    Bilirubin, UA 02/22/2023 Negative    Ketones, UA 02/22/2023  Negative    Spec Grav, UA 02/22/2023 1.020    Blood,  UA 02/22/2023 Negative    pH, UA 02/22/2023 6.0    Protein, UA 02/22/2023 Negative    Urobilinogen, UA 02/22/2023 0.2    Nitrite, UA 02/22/2023 Negative    Leukocytes, UA 02/22/2023 Negative    High Sens Troponin I 02/22/2023 4    POC Glucose 02/22/2023 107 (A)   Admission on 02/16/2023, Discharged on 02/16/2023  Component Date Value   WBC 02/16/2023 13.6 (H)    RBC 02/16/2023 3.55 (L)    Hemoglobin 02/16/2023 11.0 (L)    HCT 02/16/2023 33.0 (L)    MCV 02/16/2023 93.0    MCH 02/16/2023 31.0    MCHC 02/16/2023 33.3    RDW 02/16/2023 12.5    Platelets 02/16/2023 250    nRBC 02/16/2023 0.0    Color, Urine 02/16/2023 YELLOW (A)    APPearance 02/16/2023 CLOUDY (A)    Specific Gravity, Urine 02/16/2023 1.016    pH 02/16/2023 5.0    Glucose, UA 02/16/2023 NEGATIVE    Hgb urine dipstick 02/16/2023 NEGATIVE    Bilirubin Urine 02/16/2023 NEGATIVE    Ketones, ur 02/16/2023 NEGATIVE    Protein, ur 02/16/2023 NEGATIVE    Nitrite 02/16/2023 NEGATIVE    Leukocytes,Ua 02/16/2023 TRACE (A)    RBC / HPF 02/16/2023 0-5    WBC, UA 02/16/2023 6-10    Bacteria, UA 02/16/2023 RARE (A)    Squamous Epithelial / HPF 02/16/2023 21-50    Mucus 02/16/2023 PRESENT    Troponin I (High Sensiti* 02/16/2023 <2    Sodium 02/16/2023 137    Potassium 02/16/2023 3.4 (L)    Chloride 02/16/2023 102    CO2 02/16/2023 24    Glucose, Bld 02/16/2023 132 (H)    BUN 02/16/2023 15    Creatinine, Ser 02/16/2023 0.54    Calcium 02/16/2023 8.8 (L)    Total Protein 02/16/2023 6.9    Albumin 02/16/2023 4.1    AST 02/16/2023 23    ALT 02/16/2023 15    Alkaline Phosphatase 02/16/2023 54    Total Bilirubin 02/16/2023 0.6    GFR, Estimated 02/16/2023 >60    Anion gap 02/16/2023 11    Troponin I (High Sensiti* 02/16/2023 3   Office Visit on 02/11/2023  Component Date Value   Color, UA 02/11/2023 Yellow    Clarity, UA 02/11/2023 clear    Glucose, UA 02/11/2023  Negative    Bilirubin, UA 02/11/2023 negative    Ketones, UA 02/11/2023 15    Spec Grav, UA 02/11/2023 1.025    Blood, UA 02/11/2023 negative    pH, UA 02/11/2023 5.0    Protein, UA 02/11/2023 Negative    Urobilinogen, UA 02/11/2023 0.2    Nitrite, UA 02/11/2023 negative    Leukocytes, UA 02/11/2023 Trace (A)    MICRO NUMBER: 02/11/2023 [id number]    SPECIMEN QUALITY: 02/11/2023 Adequate    Sample Source 02/11/2023 URINE    STATUS: 02/11/2023 FINAL    Result: 02/11/2023                     Value:Mixed genital flora isolated. These superficial bacteria are not indicative of a urinary tract infection. No further organism identification is warranted on this specimen. If clinically indicated, recollect clean-catch, mid-stream urine and transfer  immediately to Urine Culture Transport Tube.    WBC, UA 02/11/2023 0-2/hpf    RBC / HPF 02/11/2023 0-2/hpf    Mucus, UA 02/11/2023 Presence of (A)    Squamous Epithelial / HPF 02/11/2023 Few(5-10/hpf) (A)    Hyaline Casts, UA 02/11/2023 Presence  of (A)   Office Visit on 01/25/2023  Component Date Value   WBC 01/25/2023 11.8 (H)    RBC 01/25/2023 4.02    Hemoglobin 01/25/2023 12.5    HCT 01/25/2023 37.6    MCV 01/25/2023 93.4    MCHC 01/25/2023 33.2    RDW 01/25/2023 13.8    Platelets 01/25/2023 375.0    Neutrophils Relative % 01/25/2023 64.2    Lymphocytes Relative 01/25/2023 27.2    Monocytes Relative 01/25/2023 6.8    Eosinophils Relative 01/25/2023 1.1    Basophils Relative 01/25/2023 0.7    Neutro Abs 01/25/2023 7.6    Lymphs Abs 01/25/2023 3.2    Monocytes Absolute 01/25/2023 0.8    Eosinophils Absolute 01/25/2023 0.1    Basophils Absolute 01/25/2023 0.1    Sodium 01/25/2023 140    Potassium 01/25/2023 3.4 (L)    Chloride 01/25/2023 102    CO2 01/25/2023 24    Glucose, Bld 01/25/2023 153 (H)    BUN 01/25/2023 14    Creatinine, Ser 01/25/2023 0.62    Total Bilirubin 01/25/2023 0.2    Alkaline Phosphatase 01/25/2023 66    AST  01/25/2023 20    ALT 01/25/2023 18    Total Protein 01/25/2023 7.9    Albumin 01/25/2023 4.9    GFR 01/25/2023 106.14    Calcium 01/25/2023 9.8    Hgb A1c MFr Bld 01/25/2023 5.8    Vitamin B-12 01/25/2023 411    HAV 1 IGG,TYPE SPECIFIC * 01/25/2023 27.60 (H)    HSV 2 IGG,TYPE SPECIFIC * 01/25/2023 <0.90    RPR Ser Ql 01/25/2023 NON-REACTIVE    Hepatitis B Surface Ag 01/25/2023 Negative    Hep B E Ag 01/25/2023 Negative    Hep B C IgM 01/25/2023 Negative    Hep B Core Total Ab 01/25/2023 Negative    Hep B E Ab 01/25/2023 Non Reactive    Hep B Surface Ab, Qual 01/25/2023 Non Reactive    Hep C Virus Ab 01/25/2023 Non Reactive    HIV Screen 4th Generatio* 01/25/2023 Non Reactive    Neisseria Gonorrhea 01/25/2023 Negative    Chlamydia 01/25/2023 Negative    Trichomonas 01/25/2023 Negative    Bacterial Vaginitis (gar* 01/25/2023 Negative    Candida Vaginitis 01/25/2023 Positive (A)    Candida Glabrata 01/25/2023 Negative    Comment 01/25/2023 Normal Reference Range Candida Species - Negative    Comment 01/25/2023 Normal Reference Range Candida Galbrata - Negative    Comment 01/25/2023 Normal Reference Range Trichomonas - Negative    Comment 01/25/2023 Normal Reference Ranger Chlamydia - Negative    Comment 01/25/2023 Normal Reference Range Neisseria Gonorrhea - Negative    Comment 01/25/2023 Normal Reference Range Bacterial Vaginosis - Negative    Cholesterol 01/25/2023 255 (H)    Triglycerides 01/25/2023 258.0 (H)    HDL 01/25/2023 81.50    VLDL 01/25/2023 51.6 (H)    Total CHOL/HDL Ratio 01/25/2023 3    NonHDL 01/25/2023 173.08    TSH 01/25/2023 0.82    Direct LDL 01/25/2023 160.0   Office Visit on 12/29/2022  Component Date Value   Color, UA 12/29/2022 yellow    Clarity, UA 12/29/2022 clear    Glucose, UA 12/29/2022 Negative    Bilirubin, UA 12/29/2022 Negative    Ketones, UA 12/29/2022 Negative    Spec Grav, UA 12/29/2022 1.020    Blood, UA 12/29/2022 Negative    pH, UA  12/29/2022 6.0    Protein, UA 12/29/2022 Negative    Urobilinogen, UA 12/29/2022 0.2  Nitrite, UA 12/29/2022 Negative    Leukocytes, UA 12/29/2022 Negative   Video Visit on 11/11/2022  Component Date Value   Neisseria Gonorrhea 11/11/2022 Negative    Chlamydia 11/11/2022 Negative    Trichomonas 11/11/2022 Negative    Bacterial Vaginitis (gar* 11/11/2022 Positive (A)    Candida Vaginitis 11/11/2022 Negative    Candida Glabrata 11/11/2022 Negative    Comment 11/11/2022 Normal Reference Range Bacterial Vaginosis - Negative    Comment 11/11/2022 Normal Reference Range Candida Species - Negative    Comment 11/11/2022 Normal Reference Range Candida Galbrata - Negative    Comment 11/11/2022 Normal Reference Range Trichomonas - Negative    Comment 11/11/2022 Normal Reference Ranger Chlamydia - Negative    Comment 11/11/2022 Normal Reference Range Neisseria Gonorrhea - Negative   Office Visit on 10/26/2022  Component Date Value   Color, UA 10/26/2022 yellow    Clarity, UA 10/26/2022 clear    Glucose, UA 10/26/2022 Negative    Bilirubin, UA 10/26/2022 negative    Ketones, UA 10/26/2022 positive    Spec Grav, UA 10/26/2022 >=1.030 (A)    Blood, UA 10/26/2022 negative    pH, UA 10/26/2022 5.0    Protein, UA 10/26/2022 Negative    Urobilinogen, UA 10/26/2022 0.2    Nitrite, UA 10/26/2022 negative    Leukocytes, UA 10/26/2022 Trace (A)    MICRO NUMBER: 10/26/2022 [id number]    SPECIMEN QUALITY: 10/26/2022 Adequate    Sample Source 10/26/2022 URINE    STATUS: 10/26/2022 FINAL    Result: 10/26/2022 No Growth    Neisseria Gonorrhea 10/26/2022 Negative    Chlamydia 10/26/2022 Negative    Trichomonas 10/26/2022 Negative    Bacterial Vaginitis-Urine 10/26/2022 Negative    Candida Urine 10/26/2022 Negative    Molecular Comment 10/26/2022 For tests bacteria and/or candida, this specimen does not meet the    Molecular Comment 10/26/2022 strict criteria set by the FDA. The result interpretation  should be    Molecular Comment 10/26/2022 considered in conjunction with the patient's clinical history.    Comment 10/26/2022 Normal Reference Ranger Chlamydia - Negative    Comment 10/26/2022 Normal Reference Range Neisseria Gonorrhea - Negative    Comment 10/26/2022 Normal Reference Range Trichomonas - Negative    WBC, UA 10/26/2022 0-2/hpf    RBC / HPF 10/26/2022 none seen    Squamous Epithelial / HPF 10/26/2022 Rare(0-4/hpf)    Uric Acid Crys, UA 10/26/2022 Presence of (A)    Amorphous 10/26/2022 Present (A)   Office Visit on 08/17/2022  Component Date Value   Vitamin B-12 08/18/2022 592    Glucose, Bld 08/18/2022 84    BUN 08/18/2022 19    Creat 08/18/2022 0.59    BUN/Creatinine Ratio 08/18/2022 SEE NOTE:    Sodium 08/18/2022 141    Potassium 08/18/2022 3.7    Chloride 08/18/2022 105    CO2 08/18/2022 23    Calcium 08/18/2022 9.8    Total Protein 08/18/2022 7.7    Albumin 08/18/2022 5.0    Globulin 08/18/2022 2.7    AG Ratio 08/18/2022 1.9    Total Bilirubin 08/18/2022 0.3    Alkaline phosphatase (AP* 08/18/2022 77    AST 08/18/2022 17    ALT 08/18/2022 11    WBC 08/18/2022 9.3    RBC 08/18/2022 4.01    Hemoglobin 08/18/2022 12.2    HCT 08/18/2022 35.4    MCV 08/18/2022 88.3    MCH 08/18/2022 30.4    MCHC 08/18/2022 34.5    RDW 08/18/2022 12.5    Platelets 08/18/2022  314    MPV 08/18/2022 11.3    Neutro Abs 08/18/2022 4,371    Lymphs Abs 08/18/2022 3,906 (H)    Absolute Monocytes 08/18/2022 688    Eosinophils Absolute 08/18/2022 233    Basophils Absolute 08/18/2022 102    Neutrophils Relative % 08/18/2022 47    Total Lymphocyte 08/18/2022 42.0    Monocytes Relative 08/18/2022 7.4    Eosinophils Relative 08/18/2022 2.5    Basophils Relative 08/18/2022 1.1    D-Dimer, Quant 08/18/2022 0.22   Office Visit on 08/06/2022  Component Date Value   Color, UA 08/06/2022 cloudy    Clarity, UA 08/06/2022 yellow    Glucose, UA 08/06/2022 Negative    Bilirubin, UA  08/06/2022 Negative    Ketones, UA 08/06/2022 Negative    Spec Grav, UA 08/06/2022 1.025    Blood, UA 08/06/2022 3+    pH, UA 08/06/2022 6.0    Protein, UA 08/06/2022 Positive (A)    Urobilinogen, UA 08/06/2022 0.2    Nitrite, UA 08/06/2022 Negative    Leukocytes, UA 08/06/2022 Moderate (2+) (A)   There may be more visits with results that are not included.   No image results found.   MR Lumbar Spine Wo Contrast  Result Date: 01/15/2023 CLINICAL DATA:  Low back pain radiating to the right hip and leg for the past 4 months. No injury or prior surgery. EXAM: MRI LUMBAR SPINE WITHOUT CONTRAST TECHNIQUE: Multiplanar, multisequence MR imaging of the lumbar spine was performed. No intravenous contrast was administered. COMPARISON:  MRI lumbar spine dated November 15, 2018. FINDINGS: Segmentation:  Standard. Alignment: Mild dextrocurvature of the upper lumbar spine. No listhesis. Vertebrae:  No fracture, evidence of discitis, or bone lesion. Conus medullaris and cauda equina: Conus extends to the L1-L2 level. Conus and cauda equina appear normal. 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 Otherwise negative. Disc levels: T12-L1 to L3-L4:  Negative. L4-L5: Negative disc. Progressive mild bilateral facet arthropathy. No stenosis. L5-S1: Negative disc. Progressive mild bilateral facet arthropathy. No stenosis. IMPRESSION: 1. Progressive mild bilateral facet arthropathy at L4-L5 and L5-S1. No stenosis or impingement. Electronically Signed   By: Obie Dredge M.D.   On: 01/15/2023 08:34    No results found.     Additional Info: This encounter employed real-time, collaborative documentation. The patient actively reviewed and updated their medical record on a shared screen, ensuring transparency and facilitating joint problem-solving for the problem list, overview, and plan. This approach promotes accurate, informed care. The treatment plan was discussed and reviewed in detail,  including medication safety, potential side effects, and all patient questions. We confirmed understanding and comfort with the plan. Follow-up instructions were established, including contacting the office for any concerns, returning if symptoms worsen, persist, or new symptoms develop, and precautions for potential emergency department visits.

## 2023-02-27 NOTE — Assessment & Plan Note (Signed)
Patient reports taking a quarter of her beta blocker medication due to fatigue side effects. -Continue close follow-up. Current blood pressure acceptable given illness. No change

## 2023-02-27 NOTE — Patient Instructions (Signed)
VISIT SUMMARY:  During your visit, we discussed your chest pain and right abdominal pain. We suspect that your chest pain may be due to an infection in your muscles, possibly caused by intense scratching due to an allergic reaction. Your abdominal pain may be due to muscle issues or kidney stones. We also discussed your high blood pressure and the need to prevent a yeast infection during your antibiotic treatment.  YOUR PLAN:  -CHEST PAIN: We suspect that your chest pain may be due to an infection in your muscles. We will perform a chest X-ray and an EKG to further evaluate this. Your EKG looked improved. We will also start you on Doxycycline, an antibiotic, to treat the presumed infection.  -RIGHT UPPER QUADRANT ABDOMINAL PAIN: Your abdominal pain may be due to muscle issues or kidney stones. If your symptoms persist after the antibiotic treatment, we may consider an ultrasound to further investigate.  -HYPERTENSION: You reported taking a quarter of your beta blocker medication due to fatigue side effects. We will continue to closely monitor your blood pressure.  -PROPHYLAXIS FOR ANTIBIOTIC-INDUCED YEAST INFECTION: Since you have a history of yeast infections following antibiotic use, we will prescribe Diflucan to prevent a yeast infection during your antibiotic treatment.  The antibiotics are prescribed on the assumption that you have infection in the tender areas from scratching the skin previously.  INSTRUCTIONS:  Please take your medications as prescribed. If your symptoms persist or worsen, please contact our office immediately. We will schedule a follow-up appointment to monitor your progress.

## 2023-03-01 ENCOUNTER — Ambulatory Visit (INDEPENDENT_AMBULATORY_CARE_PROVIDER_SITE_OTHER)
Admission: RE | Admit: 2023-03-01 | Discharge: 2023-03-01 | Disposition: A | Discharge status: Home / Self Care | Payer: Medicaid Other | Source: Ambulatory Visit | Attending: Internal Medicine | Admitting: Internal Medicine

## 2023-03-01 DIAGNOSIS — R079 Chest pain, unspecified: Secondary | ICD-10-CM | POA: Diagnosis not present

## 2023-03-01 DIAGNOSIS — R0602 Shortness of breath: Secondary | ICD-10-CM | POA: Diagnosis not present

## 2023-03-01 DIAGNOSIS — R0781 Pleurodynia: Secondary | ICD-10-CM | POA: Diagnosis not present

## 2023-03-02 ENCOUNTER — Other Ambulatory Visit: Payer: Self-pay

## 2023-03-02 DIAGNOSIS — R1011 Right upper quadrant pain: Secondary | ICD-10-CM

## 2023-03-02 NOTE — Progress Notes (Signed)
This normal but the right upper quadrant of abdomen has continued - we will get right upper quadrant of abdomen ultrasound stat as its severe pain and associated with blood pressure and HR changes

## 2023-03-02 NOTE — Addendum Note (Signed)
Addended by: Donzetta Starch on: 03/02/2023 04:14 PM   Modules accepted: Orders

## 2023-03-03 ENCOUNTER — Other Ambulatory Visit: Payer: Self-pay | Admitting: Internal Medicine

## 2023-03-03 ENCOUNTER — Ambulatory Visit (INDEPENDENT_AMBULATORY_CARE_PROVIDER_SITE_OTHER): Payer: Medicaid Other

## 2023-03-03 ENCOUNTER — Ambulatory Visit (HOSPITAL_BASED_OUTPATIENT_CLINIC_OR_DEPARTMENT_OTHER)
Admission: RE | Admit: 2023-03-03 | Discharge: 2023-03-03 | Disposition: A | Discharge status: Home / Self Care | Payer: Medicaid Other | Source: Ambulatory Visit | Attending: Internal Medicine | Admitting: Internal Medicine

## 2023-03-03 VITALS — BP 123/87

## 2023-03-03 DIAGNOSIS — G8929 Other chronic pain: Secondary | ICD-10-CM | POA: Diagnosis not present

## 2023-03-03 DIAGNOSIS — R1011 Right upper quadrant pain: Secondary | ICD-10-CM | POA: Diagnosis not present

## 2023-03-03 DIAGNOSIS — E538 Deficiency of other specified B group vitamins: Secondary | ICD-10-CM | POA: Diagnosis not present

## 2023-03-03 DIAGNOSIS — M5442 Lumbago with sciatica, left side: Secondary | ICD-10-CM | POA: Diagnosis not present

## 2023-03-03 DIAGNOSIS — R000 Tachycardia, unspecified: Secondary | ICD-10-CM

## 2023-03-03 DIAGNOSIS — M5441 Lumbago with sciatica, right side: Secondary | ICD-10-CM | POA: Diagnosis not present

## 2023-03-03 DIAGNOSIS — D72829 Elevated white blood cell count, unspecified: Secondary | ICD-10-CM

## 2023-03-03 DIAGNOSIS — R0602 Shortness of breath: Secondary | ICD-10-CM

## 2023-03-03 MED ORDER — CYANOCOBALAMIN 1000 MCG/ML IJ SOLN
1000.0000 ug | Freq: Once | INTRAMUSCULAR | Status: AC
Start: 2023-03-03 — End: 2023-03-03
  Administered 2023-03-03: 1000 ug via INTRAMUSCULAR

## 2023-03-03 MED ORDER — KETOROLAC TROMETHAMINE 60 MG/2ML IM SOLN
60.0000 mg | Freq: Once | INTRAMUSCULAR | Status: AC
Start: 2023-03-03 — End: 2023-03-03
  Administered 2023-03-03: 60 mg via INTRAMUSCULAR

## 2023-03-03 NOTE — Progress Notes (Signed)
Pt is here for B12 injection and Toradol shot for Dr. Glenetta Hew. Pt tolerated well.

## 2023-03-05 ENCOUNTER — Other Ambulatory Visit: Payer: Self-pay

## 2023-03-05 DIAGNOSIS — D72829 Elevated white blood cell count, unspecified: Secondary | ICD-10-CM

## 2023-03-05 DIAGNOSIS — R0602 Shortness of breath: Secondary | ICD-10-CM

## 2023-03-05 DIAGNOSIS — R000 Tachycardia, unspecified: Secondary | ICD-10-CM | POA: Diagnosis not present

## 2023-03-05 DIAGNOSIS — R7309 Other abnormal glucose: Secondary | ICD-10-CM

## 2023-03-05 LAB — CBC WITH DIFFERENTIAL/PLATELET
Absolute Monocytes: 883 {cells}/uL (ref 200–950)
Basophils Absolute: 85 {cells}/uL (ref 0–200)
Basophils Relative: 0.7 %
Eosinophils Absolute: 145 {cells}/uL (ref 15–500)
Eosinophils Relative: 1.2 %
HCT: 36.2 % (ref 35.0–45.0)
Hemoglobin: 12.2 g/dL (ref 11.7–15.5)
Lymphs Abs: 2190 {cells}/uL (ref 850–3900)
MCH: 30.7 pg (ref 27.0–33.0)
MCHC: 33.7 g/dL (ref 32.0–36.0)
MCV: 91.2 fL (ref 80.0–100.0)
MPV: 11.2 fL (ref 7.5–12.5)
Monocytes Relative: 7.3 %
Neutro Abs: 8797 {cells}/uL — ABNORMAL HIGH (ref 1500–7800)
Neutrophils Relative %: 72.7 %
Platelets: 399 10*3/uL (ref 140–400)
RBC: 3.97 10*6/uL (ref 3.80–5.10)
RDW: 12.8 % (ref 11.0–15.0)
Total Lymphocyte: 18.1 %
WBC: 12.1 10*3/uL — ABNORMAL HIGH (ref 3.8–10.8)

## 2023-03-05 NOTE — Addendum Note (Signed)
Addended by: Donzetta Starch on: 03/05/2023 03:26 PM   Modules accepted: Orders

## 2023-03-08 ENCOUNTER — Other Ambulatory Visit: Payer: Medicaid Other

## 2023-03-09 DIAGNOSIS — N3946 Mixed incontinence: Secondary | ICD-10-CM | POA: Diagnosis not present

## 2023-03-09 DIAGNOSIS — R350 Frequency of micturition: Secondary | ICD-10-CM | POA: Diagnosis not present

## 2023-03-09 DIAGNOSIS — R351 Nocturia: Secondary | ICD-10-CM | POA: Diagnosis not present

## 2023-03-10 ENCOUNTER — Ambulatory Visit (INDEPENDENT_AMBULATORY_CARE_PROVIDER_SITE_OTHER): Payer: Medicaid Other | Admitting: Family Medicine

## 2023-03-10 ENCOUNTER — Encounter: Payer: Self-pay | Admitting: Family Medicine

## 2023-03-10 VITALS — BP 122/72 | HR 96 | Temp 98.0°F | Resp 18 | Ht 59.0 in | Wt 143.0 lb

## 2023-03-10 DIAGNOSIS — G8929 Other chronic pain: Secondary | ICD-10-CM

## 2023-03-10 DIAGNOSIS — F902 Attention-deficit hyperactivity disorder, combined type: Secondary | ICD-10-CM | POA: Diagnosis not present

## 2023-03-10 DIAGNOSIS — M5442 Lumbago with sciatica, left side: Secondary | ICD-10-CM | POA: Diagnosis not present

## 2023-03-10 DIAGNOSIS — R000 Tachycardia, unspecified: Secondary | ICD-10-CM

## 2023-03-10 DIAGNOSIS — I10 Essential (primary) hypertension: Secondary | ICD-10-CM | POA: Diagnosis not present

## 2023-03-10 DIAGNOSIS — F419 Anxiety disorder, unspecified: Secondary | ICD-10-CM

## 2023-03-10 DIAGNOSIS — R0789 Other chest pain: Secondary | ICD-10-CM | POA: Diagnosis not present

## 2023-03-10 DIAGNOSIS — M5441 Lumbago with sciatica, right side: Secondary | ICD-10-CM | POA: Diagnosis not present

## 2023-03-10 NOTE — Assessment & Plan Note (Signed)
Chronic.  Worsening.  Question from caffeine use (patient has cut down), Adderall (taking or not taking does not seem to make a difference), anxiety, SVT, A-fib, etc.  Await conclusion of Zio patch.  Has appoint with cardiology end of November.

## 2023-03-10 NOTE — Assessment & Plan Note (Signed)
Chronic.  Not ideal.  MRI unremarkable.  On chronic opioids.  Needs to call and schedule pain mgmt.  Will start to wean hydrocodone.  Start with 10/325 3 times daily for 1 week, then go to twice daily for 1 to 2 weeks.  Then will decrease the dose.

## 2023-03-10 NOTE — Patient Instructions (Signed)
It was very nice to see you today!  Get compression stockings Do the urine   PLEASE NOTE:  If you had any lab tests please let us know if you have not heard back within a few days. You may see your results on MyChart before we have a chance to review them but we will give you a call once they are reviewed by [place]. If we ordered any referrals today, please let us know if you have not heard from their office within the next week.   Please try these tips to maintain a healthy lifestyle:  Eat most of your calories during the day when you are active. Eliminate processed foods including packaged sweets (pies, cakes, cookies), reduce intake of potatoes, white bread, white pasta, and white rice. Look for whole grain options, oat flour or almond flour.  Each meal should contain half fruits/vegetables, one quarter protein, and one quarter carbs (no bigger than a computer mouse).  Cut down on sweet beverages. This includes juice, soda, and sweet tea. Also watch fruit intake, though this is a healthier sweet option, it still contains natural sugar! Limit to 3 servings daily.  Drink at least 1 glass of water with each meal and aim for at least 8 glasses per day  Exercise at least 150 minutes every week.

## 2023-03-10 NOTE — Progress Notes (Signed)
Subjective:     Patient ID: Laurie Roman, female    DOB: November 14, 1975, 47 y.o.   MRN: [id number]  Chief Complaint  Patient presents with   Follow-up    Follow-up to discuss medication    HPI Urine Frequency/ Incontinence - Endorses urgency and frequency and freq UTI. States she woke up one night to urinating during her sleep while in the bed. Visited the urologist yesterday. Will be doing CT and other tests  HTN - Pt is on amlodipine 10 mg daily, losartan 100 mg daily, and metoprolol 25 mg BID(but only taking 1/4 tab at HS only as bp was going too low). Bp's running unchecked. Bp at initial check was 144/92. Bp at recheck was 122/72.No /cough/sob. Complains of lightheaded and dizziness while showering or after activity. Is wearing her Holter Monitor for around 1-2 weeks now. Doesn't see Card until 11/25.  Wonders if has POTS.  Has not done 24 hr urine for metanephrines yet.  Stopped caffeine. CP has improved a lot but still there on R some.  Does have some mild edema occassionally   ADD/ADHD - Is complaint with Adderall 20 mg, is not taking her Adderall everyday or BID. Doesn't seem to affect bp or HR.   Anxiety - Complains of stress and over thinking. Reports that wearing the heart monitor and her lightheadedness is increasing her stress about her health. Is compliant with Lexapro 20 mg. No SI  Fatigue - Endorses increased fatigue. On the weekends, she complains of no energy and has to say in bed the entire day. Chronic pain-hasn't made appt yet w/pain mgmt.  Doesn't want to do butrans d/t bp and HR issues.  Takes hydro 3-4x/day.  Willing to start weaning.  Denies any illicit drugs.    There are no preventive care reminders to display for this patient.  Past Medical History:  Diagnosis Date   ADD (attention deficit disorder)    Anxiety    Asthma    Blood transfusion without reported diagnosis    Depression    GERD (gastroesophageal reflux disease)    IBS (irritable bowel syndrome)     Insomnia    Lumbago    Migraine    Opioid dependence (HCC) 08/21/2022   Explained 08/21/22 that 4 months hydrocodone for pain management for delays in dental care has resulted in hyperalgesia and dependence   Other chest pain 08/17/2022   Normal EKG and D-dimer  Associated with lifting on father Doesn't radiate  Nonexertional  Not substernal chest pain more to the side, feels related to sleeping on it wrong (reproducible)   Sciatica    Sleep apnea    Tietze's disease    Urinary incontinence 10/26/2022    Past Surgical History:  Procedure Laterality Date   APPENDECTOMY     CESAREAN SECTION     TONSILLECTOMY     TOTAL ABDOMINAL HYSTERECTOMY W/ BILATERAL SALPINGOOPHORECTOMY     TUBAL LIGATION       Current Outpatient Medications:    albuterol (VENTOLIN HFA) 108 (90 Base) MCG/ACT inhaler, Inhale 2 puffs into the lungs every 6 (six) hours as needed for wheezing or shortness of breath., Disp: 18 g, Rfl: 3   amLODipine (NORVASC) 10 MG tablet, Take 1 tablet (10 mg total) by mouth daily. Replaces amlodipine 5, for uncontrolled blood pressure., Disp: 90 tablet, Rfl: 3   amphetamine-dextroamphetamine (ADDERALL) 20 MG tablet, Take 1 tablet (20 mg total) by mouth 2 (two) times daily., Disp: 60 tablet, Rfl: 0  ARIPiprazole (ABILIFY) 2 MG tablet, Take 1 tablet (2 mg total) by mouth daily as needed., Disp: , Rfl:    budesonide-formoterol (SYMBICORT) 160-4.5 MCG/ACT inhaler, Inhale 2 puffs into the lungs 2 (two) times daily., Disp: 1 each, Rfl: 3   chlorhexidine (PERIDEX) 0.12 % solution, Use as directed 15 mLs in the mouth or throat 2 (two) times daily., Disp: 1893 mL, Rfl: 0   doxepin (SINEQUAN) 10 MG capsule, Take 1 capsule (10 mg total) by mouth at bedtime., Disp: 30 capsule, Rfl: 1   Erenumab-aooe (AIMOVIG) 140 MG/ML SOAJ, Inject 140 mg into the skin every 30 (thirty) days., Disp: 3 mL, Rfl: 3   escitalopram (LEXAPRO) 20 MG tablet, Take 1 tablet (20 mg total) by mouth daily., Disp: 90 tablet,  Rfl: 1   fluticasone (FLONASE) 50 MCG/ACT nasal spray, Place 1 spray into both nostrils daily., Disp: 16 g, Rfl: 0   gabapentin (NEURONTIN) 800 MG tablet, Take 1 tablet (800 mg total) by mouth 3 (three) times daily. Replaces prior dosing schedule., Disp: 90 tablet, Rfl: 2   HYDROcodone-acetaminophen (NORCO) 10-325 MG tablet, Take 1 tablet by mouth every 8 (eight) hours as needed., Disp: 60 tablet, Rfl: 0   losartan (COZAAR) 100 MG tablet, Take 1 tablet (100 mg total) by mouth daily., Disp: 90 tablet, Rfl: 1   magic mouthwash (nystatin, lidocaine, diphenhydrAMINE, alum & mag hydroxide) suspension, Swish and swallow 5 mLs 4 (four) times daily., Disp: 180 mL, Rfl: 0   metoprolol tartrate (LOPRESSOR) 25 MG tablet, Take 1 tablet (25 mg total) by mouth 2 (two) times daily., Disp: 180 tablet, Rfl: 0   nystatin (MYCOSTATIN) 100000 UNIT/ML suspension, Take 5 mLs (500,000 Units total) by mouth 4 (four) times daily. Swish and hold in mouth few seconds and swallow, Disp: 60 mL, Rfl: 0   nystatin (MYCOSTATIN/NYSTOP) powder, Apply 1 Application topically 3 (three) times daily., Disp: 60 g, Rfl: 11   nystatin cream (MYCOSTATIN), Apply 1 Application topically 2 (two) times daily., Disp: 30 g, Rfl: 5   ondansetron (ZOFRAN) 4 MG tablet, Take 1 tablet (4 mg total) by mouth every 8 (eight) hours as needed for nausea or vomiting., Disp: 20 tablet, Rfl: 0   pantoprazole (PROTONIX) 40 MG tablet, Take 1 tablet (40 mg total) by mouth daily., Disp: 90 tablet, Rfl: 0   potassium chloride (KLOR-CON M) 20 MEQ tablet, Take 1 tablet (20 mEq total) by mouth daily., Disp: 90 tablet, Rfl: 0   rizatriptan (MAXALT) 10 MG tablet, Take 1 tablet (10 mg total) by mouth as needed for migraine. May repeat in 2 hours if needed, Disp: 10 tablet, Rfl: 3   tiZANidine (ZANAFLEX) 4 MG tablet, Take 1 tablet (4 mg total) by mouth every 6 (six) hours as needed for muscle spasms., Disp: 30 tablet, Rfl: 3   fluconazole (DIFLUCAN) 150 MG tablet, Take 1  tablet (150 mg total) by mouth every three (3) days as needed. (Patient not taking: Reported on 03/10/2023), Disp: 2 tablet, Rfl: 0  Current Facility-Administered Medications:    ondansetron (ZOFRAN-ODT) disintegrating tablet 4 mg, 4 mg, Oral, Once, [name], MD  Allergies  Allergen Reactions   Cefazolin Hives    Only IV formulation can safely take by mouth keflex   Ondansetron Hcl Hives   Sulfa Antibiotics Rash   Tramadol Hcl     Other reaction(s): chest pain   Emgality [Galcanezumab-Gnlm]    Macrobid [Nitrofurantoin] Itching    Red welts.   Ultram [Tramadol] Other (See Comments)  Tachycardia   Oxycodone Rash   Sulfasalazine Rash   ROS neg/noncontributory except as noted HPI/below      Objective:     BP 122/72 (BP Location: Left Arm, Patient Position: Sitting, Cuff Size: Large)   Pulse 96   Temp 98 F (36.7 C) (Temporal)   Resp 18   Ht 4\' 11"  (1.499 m)   Wt 143 lb (64.9 kg)   SpO2 100%   BMI 28.88 kg/m  Wt Readings from Last 3 Encounters:  03/10/23 143 lb (64.9 kg)  02/26/23 147 lb (66.7 kg)  02/22/23 144 lb (65.3 kg)    Physical Exam   Gen: WDWN NAD HEENT: NCAT, conjunctiva not injected, sclera nonicteric NECK:  supple, no thyromegaly, no nodes, no carotid bruits CARDIAC: RRR, S1S2+, no murmur. DP 2+B.  Zio present LUNGS: CTAB. No wheezes ABDOMEN:  BS+, soft, No HSM, no masses +mild TTP right upper quadrant and right upper chest wall EXT:  + trace edema right ankle MSK: no gross abnormalities.  NEURO: A&O x3.  CN II-XII intact.  PSYCH: normal mood. Good eye contact    Assessment & Plan:  Essential hypertension Assessment & Plan: Chronic.  At times, well-controlled on amlodipine 10 mg, losartan 100 mg, Lopressor 6.25 mg at at bedtime.  However, at other times she has spikes of her blood pressure with tachycardia.  Consider pheochromocytoma (patient will get her 24-hour urine done over the weekend).  Question from pain, question from meds, stress,  other.  She does have a Zio patch on.  When increases Lopressor, blood pressures are lower.  Advised to wear compression stockings.  She does see cardiology in November.  Has decreased caffeine use.  Need to monitor closely.   Tachycardia Assessment & Plan: Chronic.  Worsening.  Question from caffeine use (patient has cut down), Adderall (taking or not taking does not seem to make a difference), anxiety, SVT, A-fib, etc.  Await conclusion of Zio patch.  Has appoint with cardiology end of November.   Other chest pain  Chronic bilateral low back pain with bilateral sciatica Assessment & Plan: Chronic.  Not ideal.  MRI unremarkable.  On chronic opioids.  Needs to call and schedule pain mgmt.  Will start to wean hydrocodone.  Start with 10/325 3 times daily for 1 week, then go to twice daily for 1 to 2 weeks.  Then will decrease the dose.   Attention deficit hyperactivity disorder (ADHD), combined type Assessment & Plan: Chronic.  Controlled on Adderall 20 mg twice daily as needed.  Advised to hold the medicine so we can get the 24-hour urine metanephrines done.   Anxiety Assessment & Plan: Chronic.  Not ideally controlled.  Continue Lexapro 20 mg daily.  Now she is concerned with her health.  Workup is in progress.   Chest pain-w/u neg ACS.  ?GERD,anx,muscle, other.  U/s gb normal, d dimer and troponin normal.  Improving.  Monitor.  Return in about 4 weeks (around 04/07/2023) for chronic follow-up. Germaine Pomfret Rice,acting as a scribe for Angelena Sole, MD.,have documented all relevant documentation on the behalf of Angelena Sole, MD,as directed by  Angelena Sole, MD while in the presence of Angelena Sole, MD.  I, Angelena Sole, MD, have reviewed all documentation for this visit. The documentation on 03/10/23 for the exam, diagnosis, procedures, and orders are all accurate and complete.  Angelena Sole, MD

## 2023-03-10 NOTE — Assessment & Plan Note (Signed)
Chronic.  At times, well-controlled on amlodipine 10 mg, losartan 100 mg, Lopressor 6.25 mg at at bedtime.  However, at other times she has spikes of her blood pressure with tachycardia.  Consider pheochromocytoma (patient will get her 24-hour urine done over the weekend).  Question from pain, question from meds, stress, other.  She does have a Zio patch on.  When increases Lopressor, blood pressures are lower.  Advised to wear compression stockings.  She does see cardiology in November.  Has decreased caffeine use.  Need to monitor closely.

## 2023-03-10 NOTE — Assessment & Plan Note (Signed)
Chronic.  Controlled on Adderall 20 mg twice daily as needed.  Advised to hold the medicine so we can get the 24-hour urine metanephrines done.

## 2023-03-10 NOTE — Assessment & Plan Note (Signed)
Chronic.  Not ideally controlled.  Continue Lexapro 20 mg daily.  Now she is concerned with her health.  Workup is in progress.

## 2023-03-11 ENCOUNTER — Other Ambulatory Visit: Payer: Self-pay | Admitting: Family Medicine

## 2023-03-11 DIAGNOSIS — M5431 Sciatica, right side: Secondary | ICD-10-CM

## 2023-03-11 MED ORDER — HYDROCODONE-ACETAMINOPHEN 10-325 MG PO TABS: 1.0000 | ORAL_TABLET | Freq: Three times a day (TID) | ORAL | 0 refills | Status: DC | PRN

## 2023-03-12 ENCOUNTER — Other Ambulatory Visit: Payer: Self-pay | Admitting: Family Medicine

## 2023-03-12 DIAGNOSIS — M5432 Sciatica, left side: Secondary | ICD-10-CM

## 2023-03-16 ENCOUNTER — Other Ambulatory Visit: Payer: Self-pay

## 2023-03-16 DIAGNOSIS — R110 Nausea: Secondary | ICD-10-CM

## 2023-03-16 MED ORDER — ONDANSETRON HCL 4 MG PO TABS: 4.0000 mg | ORAL_TABLET | Freq: Three times a day (TID) | ORAL | 0 refills | Status: DC | PRN

## 2023-03-24 ENCOUNTER — Encounter: Payer: Self-pay | Admitting: Internal Medicine

## 2023-03-24 ENCOUNTER — Telehealth (INDEPENDENT_AMBULATORY_CARE_PROVIDER_SITE_OTHER): Payer: Medicaid Other | Admitting: Internal Medicine

## 2023-03-24 ENCOUNTER — Other Ambulatory Visit: Payer: Self-pay

## 2023-03-24 VITALS — BP 155/96 | HR 89 | Wt 145.0 lb

## 2023-03-24 DIAGNOSIS — Z79899 Other long term (current) drug therapy: Secondary | ICD-10-CM | POA: Diagnosis not present

## 2023-03-24 DIAGNOSIS — F902 Attention-deficit hyperactivity disorder, combined type: Secondary | ICD-10-CM

## 2023-03-24 DIAGNOSIS — F1123 Opioid dependence with withdrawal: Secondary | ICD-10-CM | POA: Diagnosis not present

## 2023-03-24 MED ORDER — BUPRENORPHINE HCL-NALOXONE HCL 8-2 MG SL FILM: 1.0000 | ORAL_FILM | Freq: Every day | SUBLINGUAL | 0 refills | Status: DC

## 2023-03-24 MED ORDER — AMPHETAMINE-DEXTROAMPHETAMINE 20 MG PO TABS
20.0000 mg | ORAL_TABLET | Freq: Two times a day (BID) | ORAL | 0 refills | Status: DC
Start: 2023-03-24 — End: 2023-05-10

## 2023-03-24 NOTE — Patient Instructions (Signed)
Visit Information  Ms. Daigneault was given information about Medicaid Managed Care team care coordination services as a part of their [facility] Community Plan Medicaid benefit. [name] verbally consented to engagement with the [facility] Managed Care team.   If you are experiencing a medical emergency, please call 911 or report to your local emergency department or urgent care.   If you have a non-emergency medical problem during routine business hours, please contact your provider's office and ask to speak with a nurse.   For questions related to your [facility], please call: [phone] or visit the homepage here: [email or web address]  If you would like to schedule transportation through your [facility], please call the following number at least 2 days in advance of your appointment: [phone]   Rides for urgent appointments can also be made after hours by calling Member Services.  Call the Behavioral Health Crisis Line at [phone], at any time, 24 hours a day, 7 days a week. If you are in danger or need immediate medical attention call 911.  If you would like help to quit smoking, call 1-800-QUIT-NOW ((980)837-0576) OR Espaol: 1-855-Djelo-Ya (1-324-401-0272) o para ms informacin haga clic aqu or Text READY to 536-644 to register via text  Ms. Betker - following are the goals we discussed in your visit today:   Goals Addressed   None      The  Patient                                              has been provided with contact information for the Managed Medicaid care management team and has been advised to call with any health related questions or concerns.  Gus Puma, Kenard Gower, MHA [facility]  Managed Medicaid Social Worker [phone]   Following is a copy of your plan of care:  There are no care plans that you recently modified to display for this  patient.

## 2023-03-24 NOTE — Assessment & Plan Note (Deleted)
She is currently on Adderall and has requested a refill. We will provide a one-month refill of Adderall and continue to monitor her ADHD symptoms and medication adherence. Discussed this plan with her Primary Care Provider (PCP).

## 2023-03-24 NOTE — Patient Outreach (Signed)
Medicaid Managed Care Social Work Note  03/24/2023 Name:  Laurie Roman MRN:  [id number] DOB:  10/23/1975  Laurie Roman is an 47 y.o. year old female who is a primary patient of Jeani Sow, MD.  The Medicaid Managed Care Coordination team was consulted for assistance with:  Community Resources   Laurie Roman was given information about Medicaid Managed Care Coordination team services today. Laurie Roman Patient agreed to services and verbal consent obtained.  Engaged with patient  for by telephone forfollow up visit in response to referral for case management and/or care coordination services.   Assessments/Interventions:  Review of past medical history, allergies, medications, health status, including review of consultants reports, laboratory and other test data, was performed as part of comprehensive evaluation and provision of chronic care management services.  SDOH: (Social Determinant of Health) assessments and interventions performed: SDOH Interventions    Flowsheet Row Video Visit from 03/24/2023 in [facility] from 12/14/2022 in [facility] from 07/23/2022 in [facility] from 04/10/2022 in [facility] Video Visit from 09/11/2021 in [facility] Video Visit from 12/12/2020 in [facility]  SDOH Interventions        Depression Interventions/Treatment  Currently on Treatment Currently on Treatment Currently on Treatment, Counseling Medication, Counseling Medication, Currently on Treatment Medication, Currently on Treatment      BSW completed a  telephone outreach with patient, she states she has been out of work for  few days and did not receive the resources. Patient states she was able to make a payment. BSW will resend resources to patients email [email or web address]. No other resources are needed at this  time. Advanced Directives Status:  Not addressed in this encounter.  Care Plan                 Allergies  Allergen Reactions   Cefazolin Hives    Only IV formulation can safely take by mouth keflex   Other Hives    Odansetron injection.   Sulfa Antibiotics Rash   Tramadol Hcl     Other reaction(s): chest pain   Emgality [Galcanezumab-Gnlm]    Macrobid [Nitrofurantoin] Itching    Red welts.   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   Flank pain 10/27/2022   Urinary incontinence 10/26/2022   B12 deficiency 10/05/2022   Opioid dependence (HCC) 08/21/2022   Chronic dental pain 06/29/2022   Cervicalgia 03/27/2022   Depression, major, single episode, severe (HCC) 05/16/2020   Primary insomnia 09/15/2019   Anxiety    Maxillary sinusitis 12/02/2018   Asthma 03/27/2018   Controlled substance agreement signed 10/13/2017   Migraine 08/21/2017   ADHD 08/21/2017   Essential hypertension 08/21/2017   Mild obstructive sleep apnea 02/18/2017   Left sided sciatica 09/27/2015   Gastroesophageal reflux disease without esophagitis 05/02/2014    Conditions to be addressed/monitored per PCP order:   community resources  There are no care plans that you recently modified to  display for this patient.   Follow up:  Patient agrees to Care Plan and Follow-up.  Plan: The  Patient has been provided with contact information for the Managed Medicaid care management team and has been advised to call with any health related questions or concerns.    Abelino Derrick, MHA [facility]   Managed [facility] Social Worker [phone]

## 2023-03-24 NOTE — Progress Notes (Addendum)
[facility]: [phone]   Virtual Medical Office Visit - Video Telemedicine   Patient:   Laurie Roman ([birth year]) located at home MRN:   [id number]      Date:   03/24/2023  PCP:    Jeani Sow, MD    Today's Healthcare Provider: [name], MD located at office: [facility] at [facility] [address] [place] [address] Today's Telemedicine visit was conducted via Video  after consent for telemedicine was obtained:  Video connection was never lost All video encounter participant identities and locations confirmed visually and verbally. Assessment & Plan Opioid dependence with withdrawal (HCC) She has been on chronic opioid therapy for over a year and is experiencing withdrawal symptoms after discontinuing use. She wishes to transition to Suboxone for chronic pain management and to avoid the highs and lows of her current opioid medication, with a supportive environment and motivation for change. We will start Suboxone 8mg  strips, instructing her to begin with a quarter to half strip per day. A follow-up appointment is scheduled in 5 days to assess response and adjust dosage as needed. She is advised to avoid alcohol and sedative medications while on Suboxone. Discussed this plan with her Primary Care Provider (PCP) who does not manage suboxone- plan will be for me to just manage the opioid dependence and she will retain Primary Care Provider (PCP) Dr. Ruthine Dose for all other chronic medications. Attention deficit hyperactivity disorder (ADHD), combined type She is currently on Adderall and has requested a refill. We will provide a one-month refill of Adderall and continue to monitor her ADHD symptoms and medication adherence. Discussed this plan with her Primary Care Provider (PCP). High risk medication use She reports she is stopping schedule 2 pain medications and ready to switch to schedule 3 suboxone, tired of getting withdrawal and other  issues from trying to control her pain with these. Warned her of risks of mixing with other sedatives. General Health Maintenance / Followup Plans   A follow-up appointment is scheduled in 5 days to assess response to Suboxone and adjust dosage as needed. We will continue to monitor her ADHD symptoms and medication adherence.     Diagnoses and all orders for this visit: Opioid dependence with withdrawal (HCC) -     Buprenorphine HCl-Naloxone HCl (SUBOXONE) 8-2 MG FILM; Place 1 Film under the tongue daily. Start just a quarter film per day for first dose. Ok to go up to a full film first day if inadequate relief after 30 minutes, by taking it a quarter film at a time. Attention deficit hyperactivity disorder (ADHD), combined type -     amphetamine-dextroamphetamine (ADDERALL) 20 MG tablet; Take 1 tablet (20 mg total) by mouth 2 (two) times daily.  Recommended follow-up: No follow-ups on file. Future Appointments  Date Time Provider Department Center  05/03/2023  3:00 PM Jake Bathe, MD [facility]      Subjective   47 y.o. female who has Migraine; ADHD; Essential hypertension; Mild obstructive sleep apnea; Controlled substance agreement signed; Gastroesophageal reflux disease without esophagitis; Left sided sciatica; Asthma; Maxillary sinusitis; Anxiety; Primary insomnia; Depression, major, single episode, severe (HCC); Cervicalgia; Chronic dental pain; Opioid dependence (HCC); B12 deficiency; Urinary incontinence; Flank pain; Uric acid crystalluria; Right sided sciatica; Intertrigo; Chronic bilateral low back pain with sciatica; Chronic right hip pain; and Tachycardia on their problem list. Her reasons/main concerns/chief complaints for today's office visit are Personal Problem (Pt did not want to state reason,  only a personal problem to discuss only with Dr Jon Billings)    ------------------------------------------------------------------------------------------------------------------------ AI-Extracted: Discussed the use of AI scribe software for clinical note transcription with the patient, who gave verbal consent to proceed.  History of Present Illness   The patient, with a history of chronic pain managed with hydrocodone for over a year, presents with concerns about dependence on the medication. She reports experiencing withdrawal symptoms, including increased blood pressure and restlessness, after discontinuing the medication three days prior. The decision to stop was influenced by her children's fear of a relapse into past pill misuse. The patient also mentions a history of depression, which she believes may be exacerbated by the hydrocodone.  The patient has previously tried Subutex for pain management but reports it made her feel unwell. She expresses a desire to start Suboxone, specifically in the form of strips, as an alternative to manage her chronic pain and prevent withdrawal symptoms. She expresses a strong desire to avoid falling back into past patterns of misuse and to maintain her ability to function well, particularly for her children.  The patient also mentions taking Adderall, which is due for a refill. She reports that the medication helps with her ability to concentrate, particularly in relation to her work. She expresses a desire to continue this medication.  She expresses understanding and willingness to follow this advice.  She expresses a commitment to managing her health and improving her wellbeing.      She has a past medical history of ADD (attention deficit disorder), Anxiety, Asthma, Blood transfusion without reported diagnosis, Depression, GERD (gastroesophageal reflux disease), IBS (irritable bowel syndrome), Insomnia, Lumbago, Migraine, Opioid dependence (HCC) (08/21/2022), Other chest pain (08/17/2022), Sciatica, Sleep apnea,  Tietze's disease, and Urinary incontinence (10/26/2022).  Problem list overviews that were updated at today's visit: No problems updated. Current Outpatient Medications on File Prior to Visit  Medication Sig   albuterol (VENTOLIN HFA) 108 (90 Base) MCG/ACT inhaler Inhale 2 puffs into the lungs every 6 (six) hours as needed for wheezing or shortness of breath.   amLODipine (NORVASC) 10 MG tablet Take 1 tablet (10 mg total) by mouth daily. Replaces amlodipine 5, for uncontrolled blood pressure.   ARIPiprazole (ABILIFY) 2 MG tablet Take 1 tablet (2 mg total) by mouth daily as needed.   budesonide-formoterol (SYMBICORT) 160-4.5 MCG/ACT inhaler Inhale 2 puffs into the lungs 2 (two) times daily.   chlorhexidine (PERIDEX) 0.12 % solution Use as directed 15 mLs in the mouth or throat 2 (two) times daily.   doxepin (SINEQUAN) 10 MG capsule Take 1 capsule (10 mg total) by mouth at bedtime.   Erenumab-aooe (AIMOVIG) 140 MG/ML SOAJ Inject 140 mg into the skin every 30 (thirty) days.   escitalopram (LEXAPRO) 20 MG tablet Take 1 tablet (20 mg total) by mouth daily.   fluconazole (DIFLUCAN) 150 MG tablet Take 1 tablet (150 mg total) by mouth every three (3) days as needed.   fluticasone (FLONASE) 50 MCG/ACT nasal spray Place 1 spray into both nostrils daily.   gabapentin (NEURONTIN) 800 MG tablet Take 1 tablet (800 mg total) by mouth 3 (three) times daily. Replaces prior dosing schedule.   HYDROcodone-acetaminophen (NORCO) 10-325 MG tablet Take 1 tablet by mouth every 8 (eight) hours as needed.   losartan (COZAAR) 100 MG tablet Take 1 tablet (100 mg total) by mouth daily.   magic mouthwash (nystatin, lidocaine, diphenhydrAMINE, alum & mag hydroxide) suspension Swish and swallow 5 mLs 4 (four) times daily.   metoprolol tartrate (LOPRESSOR) 25  MG tablet Take 1 tablet (25 mg total) by mouth 2 (two) times daily.   nystatin (MYCOSTATIN) 100000 UNIT/ML suspension Take 5 mLs (500,000 Units total) by mouth 4 (four)  times daily. Swish and hold in mouth few seconds and swallow   nystatin (MYCOSTATIN/NYSTOP) powder Apply 1 Application topically 3 (three) times daily.   nystatin cream (MYCOSTATIN) Apply 1 Application topically 2 (two) times daily.   ondansetron (ZOFRAN) 4 MG tablet Take 1 tablet (4 mg total) by mouth every 8 (eight) hours as needed for nausea or vomiting.   pantoprazole (PROTONIX) 40 MG tablet Take 1 tablet (40 mg total) by mouth daily.   potassium chloride (KLOR-CON M) 20 MEQ tablet Take 1 tablet (20 mEq total) by mouth daily.   rizatriptan (MAXALT) 10 MG tablet Take 1 tablet (10 mg total) by mouth as needed for migraine. May repeat in 2 hours if needed   tiZANidine (ZANAFLEX) 4 MG tablet Take 1 tablet (4 mg total) by mouth every 6 (six) hours as needed for muscle spasms.   Current Facility-Administered Medications on File Prior to Visit  Medication   ondansetron (ZOFRAN-ODT) disintegrating tablet 4 mg   Medications Discontinued During This Encounter  Medication Reason   amphetamine-dextroamphetamine (ADDERALL) 20 MG tablet Reorder     Objective   Physical Exam  BP (!) 155/96   Pulse 89   Wt 145 lb (65.8 kg)   BMI 29.29 kg/m  Wt Readings from Last 10 Encounters:  03/24/23 145 lb (65.8 kg)  03/10/23 143 lb (64.9 kg)  02/26/23 147 lb (66.7 kg)  02/22/23 144 lb (65.3 kg)  02/16/23 144 lb 9.6 oz (65.6 kg)  02/16/23 144 lb (65.3 kg)  02/11/23 144 lb 12.8 oz (65.7 kg)  01/27/23 146 lb (66.2 kg)  01/25/23 146 lb 12.8 oz (66.6 kg)  01/01/23 146 lb 9.6 oz (66.5 kg)   Vital signs reviewed.  Nursing notes reviewed. Weight trend reviewed. Abnormalities and Problem-Specific physical exam findings:  video  General Appearance:  No acute distress appreciable.   Well-groomed, ill-appearing female.  Well proportioned with no abnormal fat distribution.   Pulmonary:  Normal work of breathing at rest, no respiratory distress apparent.    Neurological:  Awake, alert, oriented, and engaged.  No  obvious focal neurological deficits or cognitive impairments.  Sensorium seems unclouded.   Speech is clear and coherent with logical content. Psychiatric:  Appropriate mood, pleasant and cooperative demeanor, thoughtful and engaged during the exam  Results            No results found for any visits on 03/24/23.  Clinical Support on 03/03/2023  Component Date Value   WBC 03/05/2023 12.1 (H)    RBC 03/05/2023 3.97    Hemoglobin 03/05/2023 12.2    HCT 03/05/2023 36.2    MCV 03/05/2023 91.2    MCH 03/05/2023 30.7    MCHC 03/05/2023 33.7    RDW 03/05/2023 12.8    Platelets 03/05/2023 399    MPV 03/05/2023 11.2    Neutro Abs 03/05/2023 8,797 (H)    Lymphs Abs 03/05/2023 2,190    Absolute Monocytes 03/05/2023 883    Eosinophils Absolute 03/05/2023 145    Basophils Absolute 03/05/2023 85    Neutrophils Relative % 03/05/2023 72.7    Total Lymphocyte 03/05/2023 18.1    Monocytes Relative 03/05/2023 7.3    Eosinophils Relative 03/05/2023 1.2    Basophils Relative 03/05/2023 0.7   Office Visit on 02/26/2023  Component Date Value   Color, UA 02/26/2023  YELLOW    Clarity, UA 02/26/2023 CLOUDY    Glucose, UA 02/26/2023 Negative    Bilirubin, UA 02/26/2023 NEG    Ketones, UA 02/26/2023 NEG    Spec Grav, UA 02/26/2023 1.020    Blood, UA 02/26/2023 NEG    pH, UA 02/26/2023 6.0    Protein, UA 02/26/2023 Negative    Urobilinogen, UA 02/26/2023 0.2    Nitrite, UA 02/26/2023 POS    Leukocytes, UA 02/26/2023 Negative   Office Visit on 02/22/2023  Component Date Value   D-Dimer, Quant 02/22/2023 0.29    Troponin I 02/22/2023 <3    Color, Urine 02/22/2023 YELLOW    APPearance 02/22/2023 CLEAR    Specific Gravity, Urine 02/22/2023 1.015    pH 02/22/2023 6.0    Total Protein, Urine 02/22/2023 NEGATIVE    Urine Glucose 02/22/2023 NEGATIVE    Ketones, ur 02/22/2023 NEGATIVE    Bilirubin Urine 02/22/2023 NEGATIVE    Hgb urine dipstick 02/22/2023 NEGATIVE    Urobilinogen, UA 02/22/2023  0.2    Leukocytes,Ua 02/22/2023 NEGATIVE    Nitrite 02/22/2023 NEGATIVE    WBC, UA 02/22/2023 0-2/hpf    RBC / HPF 02/22/2023 0-2/hpf    Squamous Epithelial / HPF 02/22/2023 Few(5-10/hpf) (A)    Bacteria, UA 02/22/2023 Rare(<10/hpf) (A)    WBC 02/22/2023 13.3 (H)    RBC 02/22/2023 4.18    Hemoglobin 02/22/2023 12.5    HCT 02/22/2023 38.9    MCV 02/22/2023 93.1    MCHC 02/22/2023 32.1    RDW 02/22/2023 13.5    Platelets 02/22/2023 374.0    Neutrophils Relative % 02/22/2023 68.3    Lymphocytes Relative 02/22/2023 22.4    Monocytes Relative 02/22/2023 8.3    Eosinophils Relative 02/22/2023 0.5    Basophils Relative 02/22/2023 0.5    Neutro Abs 02/22/2023 9.1 (H)    Lymphs Abs 02/22/2023 3.0    Monocytes Absolute 02/22/2023 1.1 (H)    Eosinophils Absolute 02/22/2023 0.1    Basophils Absolute 02/22/2023 0.1    Sodium 02/22/2023 139    Potassium 02/22/2023 3.9    Chloride 02/22/2023 101    CO2 02/22/2023 28    Glucose, Bld 02/22/2023 84    BUN 02/22/2023 11    Creatinine, Ser 02/22/2023 0.55    Total Bilirubin 02/22/2023 0.2    Alkaline Phosphatase 02/22/2023 72    AST 02/22/2023 19    ALT 02/22/2023 16    Total Protein 02/22/2023 7.9    Albumin 02/22/2023 4.8    GFR 02/22/2023 109.19    Calcium 02/22/2023 9.9    Color, UA 02/22/2023 yellow    Clarity, UA 02/22/2023 cloudy    Glucose, UA 02/22/2023 Negative    Bilirubin, UA 02/22/2023 Negative    Ketones, UA 02/22/2023 Negative    Spec Grav, UA 02/22/2023 1.020    Blood, UA 02/22/2023 Negative    pH, UA 02/22/2023 6.0    Protein, UA 02/22/2023 Negative    Urobilinogen, UA 02/22/2023 0.2    Nitrite, UA 02/22/2023 Negative    Leukocytes, UA 02/22/2023 Negative    High Sens Troponin I 02/22/2023 4    POC Glucose 02/22/2023 107 (A)   Admission on 02/16/2023, Discharged on 02/16/2023  Component Date Value   WBC 02/16/2023 13.6 (H)    RBC 02/16/2023 3.55 (L)    Hemoglobin 02/16/2023 11.0 (L)    HCT 02/16/2023 33.0 (L)     MCV 02/16/2023 93.0    MCH 02/16/2023 31.0    MCHC 02/16/2023 33.3    RDW  02/16/2023 12.5    Platelets 02/16/2023 250    nRBC 02/16/2023 0.0    Color, Urine 02/16/2023 YELLOW (A)    APPearance 02/16/2023 CLOUDY (A)    Specific Gravity, Urine 02/16/2023 1.016    pH 02/16/2023 5.0    Glucose, UA 02/16/2023 NEGATIVE    Hgb urine dipstick 02/16/2023 NEGATIVE    Bilirubin Urine 02/16/2023 NEGATIVE    Ketones, ur 02/16/2023 NEGATIVE    Protein, ur 02/16/2023 NEGATIVE    Nitrite 02/16/2023 NEGATIVE    Leukocytes,Ua 02/16/2023 TRACE (A)    RBC / HPF 02/16/2023 0-5    WBC, UA 02/16/2023 6-10    Bacteria, UA 02/16/2023 RARE (A)    Squamous Epithelial / HPF 02/16/2023 21-50    Mucus 02/16/2023 PRESENT    Troponin I (High Sensiti* 02/16/2023 <2    Sodium 02/16/2023 137    Potassium 02/16/2023 3.4 (L)    Chloride 02/16/2023 102    CO2 02/16/2023 24    Glucose, Bld 02/16/2023 132 (H)    BUN 02/16/2023 15    Creatinine, Ser 02/16/2023 0.54    Calcium 02/16/2023 8.8 (L)    Total Protein 02/16/2023 6.9    Albumin 02/16/2023 4.1    AST 02/16/2023 23    ALT 02/16/2023 15    Alkaline Phosphatase 02/16/2023 54    Total Bilirubin 02/16/2023 0.6    GFR, Estimated 02/16/2023 >60    Anion gap 02/16/2023 11    Troponin I (High Sensiti* 02/16/2023 3   Office Visit on 02/11/2023  Component Date Value   Color, UA 02/11/2023 Yellow    Clarity, UA 02/11/2023 clear    Glucose, UA 02/11/2023 Negative    Bilirubin, UA 02/11/2023 negative    Ketones, UA 02/11/2023 15    Spec Grav, UA 02/11/2023 1.025    Blood, UA 02/11/2023 negative    pH, UA 02/11/2023 5.0    Protein, UA 02/11/2023 Negative    Urobilinogen, UA 02/11/2023 0.2    Nitrite, UA 02/11/2023 negative    Leukocytes, UA 02/11/2023 Trace (A)    MICRO NUMBER: 02/11/2023 [id number]    SPECIMEN QUALITY: 02/11/2023 Adequate    Sample Source 02/11/2023 URINE    STATUS: 02/11/2023 FINAL    Result: 02/11/2023                     Value:Mixed  genital flora isolated. These superficial bacteria are not indicative of a urinary tract infection. No further organism identification is warranted on this specimen. If clinically indicated, recollect clean-catch, mid-stream urine and transfer  immediately to Urine Culture Transport Tube.    WBC, UA 02/11/2023 0-2/hpf    RBC / HPF 02/11/2023 0-2/hpf    Mucus, UA 02/11/2023 Presence of (A)    Squamous Epithelial / HPF 02/11/2023 Few(5-10/hpf) (A)    Hyaline Casts, UA 02/11/2023 Presence of (A)   Office Visit on 01/25/2023  Component Date Value   WBC 01/25/2023 11.8 (H)    RBC 01/25/2023 4.02    Hemoglobin 01/25/2023 12.5    HCT 01/25/2023 37.6    MCV 01/25/2023 93.4    MCHC 01/25/2023 33.2    RDW 01/25/2023 13.8    Platelets 01/25/2023 375.0    Neutrophils Relative % 01/25/2023 64.2    Lymphocytes Relative 01/25/2023 27.2    Monocytes Relative 01/25/2023 6.8    Eosinophils Relative 01/25/2023 1.1    Basophils Relative 01/25/2023 0.7    Neutro Abs 01/25/2023 7.6    Lymphs Abs 01/25/2023 3.2    Monocytes Absolute 01/25/2023  0.8    Eosinophils Absolute 01/25/2023 0.1    Basophils Absolute 01/25/2023 0.1    Sodium 01/25/2023 140    Potassium 01/25/2023 3.4 (L)    Chloride 01/25/2023 102    CO2 01/25/2023 24    Glucose, Bld 01/25/2023 153 (H)    BUN 01/25/2023 14    Creatinine, Ser 01/25/2023 0.62    Total Bilirubin 01/25/2023 0.2    Alkaline Phosphatase 01/25/2023 66    AST 01/25/2023 20    ALT 01/25/2023 18    Total Protein 01/25/2023 7.9    Albumin 01/25/2023 4.9    GFR 01/25/2023 106.14    Calcium 01/25/2023 9.8    Hgb A1c MFr Bld 01/25/2023 5.8    Vitamin B-12 01/25/2023 411    HAV 1 IGG,TYPE SPECIFIC * 01/25/2023 27.60 (H)    HSV 2 IGG,TYPE SPECIFIC * 01/25/2023 <0.90    RPR Ser Ql 01/25/2023 NON-REACTIVE    Hepatitis B Surface Ag 01/25/2023 Negative    Hep B E Ag 01/25/2023 Negative    Hep B C IgM 01/25/2023 Negative    Hep B Core Total Ab 01/25/2023 Negative     Hep B E Ab 01/25/2023 Non Reactive    Hep B Surface Ab, Qual 01/25/2023 Non Reactive    Hep C Virus Ab 01/25/2023 Non Reactive    HIV Screen 4th Generatio* 01/25/2023 Non Reactive    Neisseria Gonorrhea 01/25/2023 Negative    Chlamydia 01/25/2023 Negative    Trichomonas 01/25/2023 Negative    Bacterial Vaginitis (gar* 01/25/2023 Negative    Candida Vaginitis 01/25/2023 Positive (A)    Candida Glabrata 01/25/2023 Negative    Comment 01/25/2023 Normal Reference Range Candida Species - Negative    Comment 01/25/2023 Normal Reference Range Candida Galbrata - Negative    Comment 01/25/2023 Normal Reference Range Trichomonas - Negative    Comment 01/25/2023 Normal Reference Ranger Chlamydia - Negative    Comment 01/25/2023 Normal Reference Range Neisseria Gonorrhea - Negative    Comment 01/25/2023 Normal Reference Range Bacterial Vaginosis - Negative    Cholesterol 01/25/2023 255 (H)    Triglycerides 01/25/2023 258.0 (H)    HDL 01/25/2023 81.50    VLDL 01/25/2023 51.6 (H)    Total CHOL/HDL Ratio 01/25/2023 3    NonHDL 01/25/2023 173.08    TSH 01/25/2023 0.82    Direct LDL 01/25/2023 160.0   Office Visit on 12/29/2022  Component Date Value   Color, UA 12/29/2022 yellow    Clarity, UA 12/29/2022 clear    Glucose, UA 12/29/2022 Negative    Bilirubin, UA 12/29/2022 Negative    Ketones, UA 12/29/2022 Negative    Spec Grav, UA 12/29/2022 1.020    Blood, UA 12/29/2022 Negative    pH, UA 12/29/2022 6.0    Protein, UA 12/29/2022 Negative    Urobilinogen, UA 12/29/2022 0.2    Nitrite, UA 12/29/2022 Negative    Leukocytes, UA 12/29/2022 Negative   Video Visit on 11/11/2022  Component Date Value   Neisseria Gonorrhea 11/11/2022 Negative    Chlamydia 11/11/2022 Negative    Trichomonas 11/11/2022 Negative    Bacterial Vaginitis (gar* 11/11/2022 Positive (A)    Candida Vaginitis 11/11/2022 Negative    Candida Glabrata 11/11/2022 Negative    Comment 11/11/2022 Normal Reference Range Bacterial  Vaginosis - Negative    Comment 11/11/2022 Normal Reference Range Candida Species - Negative    Comment 11/11/2022 Normal Reference Range Candida Galbrata - Negative    Comment 11/11/2022 Normal Reference Range Trichomonas - Negative    Comment 11/11/2022  Normal Reference Ranger Chlamydia - Negative    Comment 11/11/2022 Normal Reference Range Neisseria Gonorrhea - Negative   Office Visit on 10/26/2022  Component Date Value   Color, UA 10/26/2022 yellow    Clarity, UA 10/26/2022 clear    Glucose, UA 10/26/2022 Negative    Bilirubin, UA 10/26/2022 negative    Ketones, UA 10/26/2022 positive    Spec Grav, UA 10/26/2022 >=1.030 (A)    Blood, UA 10/26/2022 negative    pH, UA 10/26/2022 5.0    Protein, UA 10/26/2022 Negative    Urobilinogen, UA 10/26/2022 0.2    Nitrite, UA 10/26/2022 negative    Leukocytes, UA 10/26/2022 Trace (A)    MICRO NUMBER: 10/26/2022 [id number]    SPECIMEN QUALITY: 10/26/2022 Adequate    Sample Source 10/26/2022 URINE    STATUS: 10/26/2022 FINAL    Result: 10/26/2022 No Growth    Neisseria Gonorrhea 10/26/2022 Negative    Chlamydia 10/26/2022 Negative    Trichomonas 10/26/2022 Negative    Bacterial Vaginitis-Urine 10/26/2022 Negative    Candida Urine 10/26/2022 Negative    Molecular Comment 10/26/2022 For tests bacteria and/or candida, this specimen does not meet the    Molecular Comment 10/26/2022 strict criteria set by the FDA. The result interpretation should be    Molecular Comment 10/26/2022 considered in conjunction with the patient's clinical history.    Comment 10/26/2022 Normal Reference Ranger Chlamydia - Negative    Comment 10/26/2022 Normal Reference Range Neisseria Gonorrhea - Negative    Comment 10/26/2022 Normal Reference Range Trichomonas - Negative    WBC, UA 10/26/2022 0-2/hpf    RBC / HPF 10/26/2022 none seen    Squamous Epithelial / HPF 10/26/2022 Rare(0-4/hpf)    Uric Acid Crys, UA 10/26/2022 Presence of (A)    Amorphous 10/26/2022  Present (A)   Office Visit on 08/17/2022  Component Date Value   Vitamin B-12 08/18/2022 592    Glucose, Bld 08/18/2022 84    BUN 08/18/2022 19    Creat 08/18/2022 0.59    BUN/Creatinine Ratio 08/18/2022 SEE NOTE:    Sodium 08/18/2022 141    Potassium 08/18/2022 3.7    Chloride 08/18/2022 105    CO2 08/18/2022 23    Calcium 08/18/2022 9.8    Total Protein 08/18/2022 7.7    Albumin 08/18/2022 5.0    Globulin 08/18/2022 2.7    AG Ratio 08/18/2022 1.9    Total Bilirubin 08/18/2022 0.3    Alkaline phosphatase (AP* 08/18/2022 77    AST 08/18/2022 17    ALT 08/18/2022 11    WBC 08/18/2022 9.3    RBC 08/18/2022 4.01    Hemoglobin 08/18/2022 12.2    HCT 08/18/2022 35.4    MCV 08/18/2022 88.3    MCH 08/18/2022 30.4    MCHC 08/18/2022 34.5    RDW 08/18/2022 12.5    Platelets 08/18/2022 314    MPV 08/18/2022 11.3    Neutro Abs 08/18/2022 4,371    Lymphs Abs 08/18/2022 3,906 (H)    Absolute Monocytes 08/18/2022 688    Eosinophils Absolute 08/18/2022 233    Basophils Absolute 08/18/2022 102    Neutrophils Relative % 08/18/2022 47    Total Lymphocyte 08/18/2022 42.0    Monocytes Relative 08/18/2022 7.4    Eosinophils Relative 08/18/2022 2.5    Basophils Relative 08/18/2022 1.1    D-Dimer, Quant 08/18/2022 0.22   There may be more visits with results that are not included.   No image results found.   US Abdomen Limited RUQ (LIVER/GB)  Result  Date: 03/03/2023 CLINICAL DATA:  Right upper quadrant abdominal pain for several days. EXAM: ULTRASOUND ABDOMEN LIMITED RIGHT UPPER QUADRANT COMPARISON:  September 02, 2017. FINDINGS: Gallbladder: No gallstones or wall thickening visualized. No sonographic Murphy sign noted by sonographer. Common bile duct: Diameter: 4 mm which is within normal limits. Liver: No focal lesion identified. Within normal limits in parenchymal echogenicity. Portal vein is patent on color Doppler imaging with normal direction of blood flow towards the liver. Other: None.  Electronically Signed   By: Lupita Raider M.D.   On: 03/03/2023 15:23   DG Chest 2 View  Result Date: 03/01/2023 CLINICAL DATA:  Left-sided chest pain and shortness of breath for 2 weeks EXAM: CHEST - 2 VIEW COMPARISON:  06/05/2021 FINDINGS: Midline trachea.  Normal heart size and mediastinal contours. Sharp costophrenic angles.  No pneumothorax.  Clear lungs. Loop recorder projects over the left side of the chest. IMPRESSION: No active cardiopulmonary disease. Electronically Signed   By: Jeronimo Greaves M.D.   On: 03/01/2023 10:20   MR Lumbar Spine Wo Contrast  Result Date: 01/15/2023 CLINICAL DATA:  Low back pain radiating to the right hip and leg for the past 4 months. No injury or prior surgery. EXAM: MRI LUMBAR SPINE WITHOUT CONTRAST TECHNIQUE: Multiplanar, multisequence MR imaging of the lumbar spine was performed. No intravenous contrast was administered. COMPARISON:  MRI lumbar spine dated November 15, 2018. FINDINGS: Segmentation:  Standard. Alignment: Mild dextrocurvature of the upper lumbar spine. No listhesis. Vertebrae:  No fracture, evidence of discitis, or bone lesion. Conus medullaris and cauda equina: Conus extends to the L1-L2 level. Conus and cauda equina appear normal. 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 Otherwise negative. Disc levels: T12-L1 to L3-L4:  Negative. L4-L5: Negative disc. Progressive mild bilateral facet arthropathy. No stenosis. L5-S1: Negative disc. Progressive mild bilateral facet arthropathy. No stenosis. IMPRESSION: 1. Progressive mild bilateral facet arthropathy at L4-L5 and L5-S1. No stenosis or impingement. Electronically Signed   By: Obie Dredge M.D.   On: 01/15/2023 08:34    US Abdomen Limited RUQ (LIVER/GB)  Result Date: 03/03/2023 CLINICAL DATA:  Right upper quadrant abdominal pain for several days. EXAM: ULTRASOUND ABDOMEN LIMITED RIGHT UPPER QUADRANT COMPARISON:  September 02, 2017. FINDINGS: Gallbladder: No gallstones or  wall thickening visualized. No sonographic Murphy sign noted by sonographer. Common bile duct: Diameter: 4 mm which is within normal limits. Liver: No focal lesion identified. Within normal limits in parenchymal echogenicity. Portal vein is patent on color Doppler imaging with normal direction of blood flow towards the liver. Other: None. Electronically Signed   By: Lupita Raider M.D.   On: 03/03/2023 15:23    .vide     Additional Info: This encounter employed real-time, collaborative documentation. The patient actively reviewed and updated their medical record on a shared screen, ensuring transparency and facilitating joint problem-solving for the problem list, overview, and plan. This approach promotes accurate, informed care. The treatment plan was discussed and reviewed in detail, including medication safety, potential side effects, and all patient questions. We confirmed understanding and comfort with the plan. Follow-up instructions were established, including contacting the office for any concerns, returning if symptoms worsen, persist, or new symptoms develop, and precautions for potential emergency department visits.

## 2023-03-24 NOTE — Patient Instructions (Signed)
VISIT SUMMARY:  During our visit, we discussed your concerns about dependence on hydrocodone, a medication you've been using for chronic pain. You've been experiencing withdrawal symptoms since stopping the medication. We also discussed your history of depression and your use of Adderall for concentration. You expressed a desire to switch to Suboxone to manage your pain and prevent withdrawal symptoms, and to continue using Adderall.  YOUR PLAN:  -OPIOID DEPENDENCE: You've been using an opioid medication for chronic pain and are experiencing withdrawal symptoms since stopping. We've decided to switch you to Suboxone, a medication that can help manage your pain and prevent withdrawal symptoms. This should help you avoid the highs and lows associated with your current medication.  -ADHD: You're currently using Adderall to help with concentration. We've decided to continue this medication and will provide a one-month refill. We'll continue to monitor your symptoms and how well you're sticking to your medication regimen.  INSTRUCTIONS:  We've scheduled a follow-up appointment in 5 days to see how you're responding to Suboxone and to adjust the dosage if needed. During this appointment, we'll also continue to monitor your ADHD symptoms and how well you're sticking to your medication regimen. Please remember to avoid alcohol and sedative medications while on Suboxone.

## 2023-03-24 NOTE — Assessment & Plan Note (Deleted)
She has been on chronic opioid therapy for over a year and is experiencing withdrawal symptoms after discontinuing use. She wishes to transition to Suboxone for chronic pain management and to avoid the highs and lows of her current opioid medication, with a supportive environment and motivation for change. We will start Suboxone 8mg  strips, instructing her to begin with a quarter to half strip per day. A follow-up appointment is scheduled in 5 days to assess response and adjust dosage as needed. She is advised to avoid alcohol and sedative medications while on Suboxone. Discussed this plan with her Primary Care Provider (PCP) who does not manage suboxone- plan will be for me to just manage the opioid dependence and she will retain Primary Care Provider (PCP) Dr. Ruthine Dose for all other chronic medications.

## 2023-03-25 ENCOUNTER — Ambulatory Visit: Payer: Medicaid Other | Admitting: *Deleted

## 2023-03-25 DIAGNOSIS — R519 Headache, unspecified: Secondary | ICD-10-CM

## 2023-03-25 MED ORDER — KETOROLAC TROMETHAMINE 60 MG/2ML IM SOLN
60.0000 mg | Freq: Once | INTRAMUSCULAR | Status: AC
Start: 2023-03-25 — End: 2023-03-25
  Administered 2023-03-25: 60 mg via INTRAMUSCULAR

## 2023-03-25 NOTE — Progress Notes (Signed)
Per orders of Dr. Jon Billings, injection of Toradol 60 mg  given in left ventrogluteal per patient preference by Jobe Gibbon, CMA. Patient tolerated injection well.

## 2023-03-26 ENCOUNTER — Ambulatory Visit (INDEPENDENT_AMBULATORY_CARE_PROVIDER_SITE_OTHER): Payer: Medicaid Other | Admitting: Internal Medicine

## 2023-03-26 ENCOUNTER — Encounter: Payer: Self-pay | Admitting: Internal Medicine

## 2023-03-26 ENCOUNTER — Telehealth: Payer: Self-pay | Admitting: Family Medicine

## 2023-03-26 VITALS — BP 126/85 | HR 95 | Temp 97.7°F | Ht 59.0 in | Wt 141.6 lb

## 2023-03-26 DIAGNOSIS — G43909 Migraine, unspecified, not intractable, without status migrainosus: Secondary | ICD-10-CM

## 2023-03-26 DIAGNOSIS — F1123 Opioid dependence with withdrawal: Secondary | ICD-10-CM | POA: Diagnosis not present

## 2023-03-26 DIAGNOSIS — D72829 Elevated white blood cell count, unspecified: Secondary | ICD-10-CM

## 2023-03-26 DIAGNOSIS — G8929 Other chronic pain: Secondary | ICD-10-CM

## 2023-03-26 DIAGNOSIS — M5441 Lumbago with sciatica, right side: Secondary | ICD-10-CM | POA: Diagnosis not present

## 2023-03-26 DIAGNOSIS — M5442 Lumbago with sciatica, left side: Secondary | ICD-10-CM | POA: Diagnosis not present

## 2023-03-26 DIAGNOSIS — Z79899 Other long term (current) drug therapy: Secondary | ICD-10-CM

## 2023-03-26 MED ORDER — KETOROLAC TROMETHAMINE 60 MG/2ML IM SOLN
60.0000 mg | Freq: Once | INTRAMUSCULAR | Status: AC
Start: 2023-03-26 — End: 2023-03-26
  Administered 2023-03-26: 60 mg via INTRAMUSCULAR

## 2023-03-26 MED ORDER — BUPRENORPHINE HCL-NALOXONE HCL 8-2 MG SL FILM: ORAL_FILM | SUBLINGUAL | 0 refills | Status: DC

## 2023-03-26 NOTE — Patient Instructions (Addendum)
VISIT SUMMARY:  During our appointment, we discussed your progress with Suboxone therapy for opioid dependence, your migraines, heart issues, and elevated CBC. You've been feeling better overall since starting Suboxone, but you've experienced some side effects like achy legs and diarrhea at night, and headaches. You also expressed concern about potential urges to use opioids during periods of inactivity. We've made some adjustments to your treatment plan to address these issues.  YOUR PLAN:  -OPIOID DEPENDENCE: This is a condition where you have a withdrawal if not using opioids, which can lead to misuse and/or reduced ability to function. We're increasing your Suboxone dosage to 1.5 films per day to better manage your symptoms, especially at night. We're also encouraging you to stay hydrated and increase your fiber intake to prevent constipation, a potential side effect of Suboxone.  -MIGRAINE: You've been prescribed Aimovig for your migraines, which are severe headaches often accompanied by other symptoms like nausea and sensitivity to light and sound. We'll continue with this medication as prescribed.  Increase fluid intake should also reduce headaches from suboxone and they tend to fade away.  -CARDIAC ARRHYTHMIA: This is a condition where your heart beats irregularly, which can cause symptoms like dizziness and palpitations. We'll continue with your current management strategy, which involves resting when you feel your heart racing.  -ELEVATED CBC: Your Complete Blood Count (CBC) test results have been consistently high, which could indicate inflammation or infection. We'll retest your CBC and review the results to rule out serious conditions like lymphoma. If constipation becomes an issue, we may add a stool softener to your treatment plan.  INSTRUCTIONS:  We'll continue to monitor for Suboxone appropriate use with wrapper counts. Please remember to stay hydrated and increase your fiber intake  to manage potential constipation from the increased Suboxone dose. We'll have a follow-up appointment in 8-10 days to reassess your Suboxone dose and review your CBC results.

## 2023-03-26 NOTE — Assessment & Plan Note (Deleted)
She is on Suboxone 8mg  daily, effectively managing withdrawal symptoms but reports occasional urges to use opioids, especially when in pain or fatigued. We discussed the importance of consistent dosing and the risk of constipation with Suboxone. We will increase Suboxone to 1.5 films per day, allowing her to divide the dose as needed, encourage hydration and fiber intake to manage potential constipation, and order a 10-day supply of Suboxone.

## 2023-03-26 NOTE — Progress Notes (Signed)
[facility]: [phone]   -- Medical Office Visit --  Patient:  Laurie Roman      Age: 47 y.o.       Sex:  female  Date:   03/26/2023 Patient Care Team: Jeani Sow, MD as PCP - General (Family Medicine) Shaune Leeks as Social Worker Today's Healthcare Provider: [name], MD   Assessment & Plan Opioid dependence with withdrawal [facility]) She is on Suboxone 8mg  daily, effectively managing withdrawal symptoms but reports occasional urges to use opioids, especially when in pain or fatigued. We discussed the importance of consistent dosing and the risk of constipation with Suboxone. We will increase Suboxone to 1.5 films per day, allowing her to divide the dose as needed, encourage hydration and fiber intake to manage potential constipation, and order a 10-day supply of Suboxone. Leukocytosis, unspecified type She has a longstanding history of elevated CBC, possibly due to chronic inflammation or infection, and has requested a retest. We will order a CBC and pathology smear review to rule out lymphoma. If constipation becomes an issue, we will consider adding a stool softener. Chronic bilateral low back pain with bilateral sciatica  Migraine without status migrainosus, not intractable, unspecified migraine type She has a prescription for Aimovig with remaining refills. We will continue Aimovig as prescribed. High risk medication use Assessment: Risks and benefits were weighed and continued maintenance of the controlled substance prescription will be provided.   Relevant comorbid conditions include chronic pain.  These factors are taken into account in the overall treatment plan to minimize potential interactions or complications.   Diversion Prevention, Regulatory Protocols, Adherence:  No evidence of misuse or diversion has been identified during this visit, and there has been no suspicion of such behavior.  Patient has been briefed on our diversion  prevention protocol, which is integral to our medication management strategy.  Explained and confirmed understanding of the importance of bringing medications in their original containers for verification, the option of submitting time-stamped medication photos via MyChart, and the necessity of routine urine drug screenings.   Terms were agreed upon, signed by written contract, and this is in place to ensure the safe and effective use of medically necessary controlled substance prescriptions, and to fulfill our regulatory obligations.  Adherence to our prescribing agreement has been exemplary, with no indications of misuse or diversion. We will continue to monitor and support the agreed-upon treatment plan, ensuring compliance with safety protocols and regulatory requirements.   Plan: Continued education about risks and benefits and safe use was also provided.  Importance of securing medication has been reviewed.   Risks Reviewed:  Due to sedatives being given, patient was instructed not to drive,   Sleep and Anxiety Medications;  Also recommended to not to take more than prescribed Pain, Sleep and Anxiety Medications, not to mix any such medicines with each other or alcohol.   PDMP reviewed during this encounter. We need to get urine drug screen. Obtained contract for scanning today. I will manage suboxone and Dr. Ruthine Dose will manage remaining care going forward have discuss with Dr. Ruthine Dose  General Health Maintenance   We will continue monitoring for signs of Suboxone side effect(s) and appropriate use, encourage her to maintain hydration and fiber intake to manage potential constipation from the increased Suboxone dose, and plan for a follow-up appointment in 8-10 days to reassess the Suboxone dose and review CBC results.           Diagnoses and all  orders for this visit: Leukocytosis, unspecified type -     CBC with Differential/Platelet -     Pathologist smear review Opioid dependence with  withdrawal (HCC) -     Buprenorphine HCl-Naloxone HCl (SUBOXONE) 8-2 MG FILM; Take 1.5 films daily ok to divide as needed.  But over time you should notice that it last all day and you can just take it all at once. Chronic bilateral low back pain with bilateral sciatica -     ketorolac (TORADOL) injection 60 mg  Recommended follow-up: No follow-ups on file. Future Appointments  Date Time Provider Department Center  05/03/2023  3:00 PM Jake Bathe, MD [id number]        Subjective   47 y.o. female who has Migraine; ADHD; Essential hypertension; Mild obstructive sleep apnea; Controlled substance agreement signed; Gastroesophageal reflux disease without esophagitis; Left sided sciatica; Asthma; Maxillary sinusitis; Anxiety; Primary insomnia; Depression, major, single episode, severe (HCC); Cervicalgia; Chronic dental pain; Opioid dependence (HCC); B12 deficiency; Urinary incontinence; Flank pain; Uric acid crystalluria; Right sided sciatica; Intertrigo; Chronic bilateral low back pain with sciatica; Chronic right hip pain; and Tachycardia on their problem list. Her reasons/main concerns/chief complaints for today's office visit are Discuss medication   ------------------------------------------------------------------------------------------------------------------------ AI-Extracted: Discussed the use of AI scribe software for clinical note transcription with the patient, who gave verbal consent to proceed.  History of Present Illness   The patient, with a history of opioid dependence, presents for a follow-up consultation after initiating Suboxone therapy. She reports feeling "excellent" and "energetic" since starting the medication, with a noted improvement in mood and overall demeanor. The patient has been taking 8mg  of Suboxone daily, divided into small doses throughout the day, which has effectively managed withdrawal symptoms.  However, the patient has experienced some  challenges with the medication. She reports waking up in the middle of the night with achy legs and diarrhea, suggesting the medication may be wearing off faster than expected. The patient also mentions a headache, which she attributes to the medication and her eating habits. Despite these side effects, the patient expresses a desire to continue with the treatment, requesting a slight increase in dosage to manage the nighttime symptoms better.  In addition to opioid dependence, the patient has a history of heart issues, which she reports are still causing some dizziness. She has been managing these symptoms by stopping and resting when she feels her heart racing. The patient also mentions a history of migraines, for which she is taking Aimovig. She requests a check on the refill status of this medication.  The patient also discusses her fear of relapse, particularly during periods of inactivity. She expresses concern about the upcoming weekend when she will not be as busy and fears that the urges to use opioids may return. The patient is determined to continue her recovery journey and appreciates the flexibility of her Suboxone dosage, which allows her to manage her symptoms effectively.  In summary, the patient is responding positively to Suboxone therapy for opioid dependence, with improved mood and energy levels. She is managing some side effects and has requested a slight increase in dosage to better manage nighttime symptoms. The patient remains committed to her recovery and is actively engaged in her treatment plan.      She has a past medical history of ADD (attention deficit disorder), Anxiety, Asthma, Blood transfusion without reported diagnosis, Depression, GERD (gastroesophageal reflux disease), IBS (irritable bowel syndrome), Insomnia, Lumbago, Migraine, Opioid dependence (HCC) (08/21/2022), Other chest pain (  08/17/2022), Sciatica, Sleep apnea, Tietze's disease, and Urinary incontinence  (10/26/2022).  Problem list overviews that were updated at today's visit: No problems updated. Current Outpatient Medications on File Prior to Visit  Medication Sig   albuterol (VENTOLIN HFA) 108 (90 Base) MCG/ACT inhaler Inhale 2 puffs into the lungs every 6 (six) hours as needed for wheezing or shortness of breath.   amLODipine (NORVASC) 10 MG tablet Take 1 tablet (10 mg total) by mouth daily. Replaces amlodipine 5, for uncontrolled blood pressure.   amphetamine-dextroamphetamine (ADDERALL) 20 MG tablet Take 1 tablet (20 mg total) by mouth 2 (two) times daily.   ARIPiprazole (ABILIFY) 2 MG tablet Take 1 tablet (2 mg total) by mouth daily as needed.   budesonide-formoterol (SYMBICORT) 160-4.5 MCG/ACT inhaler Inhale 2 puffs into the lungs 2 (two) times daily.   chlorhexidine (PERIDEX) 0.12 % solution Use as directed 15 mLs in the mouth or throat 2 (two) times daily.   doxepin (SINEQUAN) 10 MG capsule Take 1 capsule (10 mg total) by mouth at bedtime.   Erenumab-aooe (AIMOVIG) 140 MG/ML SOAJ Inject 140 mg into the skin every 30 (thirty) days.   escitalopram (LEXAPRO) 20 MG tablet Take 1 tablet (20 mg total) by mouth daily.   fluconazole (DIFLUCAN) 150 MG tablet Take 1 tablet (150 mg total) by mouth every three (3) days as needed.   fluticasone (FLONASE) 50 MCG/ACT nasal spray Place 1 spray into both nostrils daily.   gabapentin (NEURONTIN) 800 MG tablet Take 1 tablet (800 mg total) by mouth 3 (three) times daily. Replaces prior dosing schedule.   losartan (COZAAR) 100 MG tablet Take 1 tablet (100 mg total) by mouth daily.   magic mouthwash (nystatin, lidocaine, diphenhydrAMINE, alum & mag hydroxide) suspension Swish and swallow 5 mLs 4 (four) times daily.   metoprolol tartrate (LOPRESSOR) 25 MG tablet Take 1 tablet (25 mg total) by mouth 2 (two) times daily.   nystatin (MYCOSTATIN) 100000 UNIT/ML suspension Take 5 mLs (500,000 Units total) by mouth 4 (four) times daily. Swish and hold in mouth few  seconds and swallow   nystatin (MYCOSTATIN/NYSTOP) powder Apply 1 Application topically 3 (three) times daily.   nystatin cream (MYCOSTATIN) Apply 1 Application topically 2 (two) times daily.   ondansetron (ZOFRAN) 4 MG tablet Take 1 tablet (4 mg total) by mouth every 8 (eight) hours as needed for nausea or vomiting.   pantoprazole (PROTONIX) 40 MG tablet Take 1 tablet (40 mg total) by mouth daily.   potassium chloride (KLOR-CON M) 20 MEQ tablet Take 1 tablet (20 mEq total) by mouth daily.   rizatriptan (MAXALT) 10 MG tablet Take 1 tablet (10 mg total) by mouth as needed for migraine. May repeat in 2 hours if needed   tiZANidine (ZANAFLEX) 4 MG tablet Take 1 tablet (4 mg total) by mouth every 6 (six) hours as needed for muscle spasms.   Current Facility-Administered Medications on File Prior to Visit  Medication   ondansetron (ZOFRAN-ODT) disintegrating tablet 4 mg   Medications Discontinued During This Encounter  Medication Reason   HYDROcodone-acetaminophen (NORCO) 10-325 MG tablet Completed Course   Buprenorphine HCl-Naloxone HCl (SUBOXONE) 8-2 MG FILM Reorder     Objective   Physical Exam  BP 126/85 (BP Location: Left Arm, Patient Position: Sitting)   Pulse 95   Temp 97.7 F (36.5 C) (Temporal)   Ht 4\' 11"  (1.499 m)   Wt 141 lb 9.6 oz (64.2 kg)   SpO2 99%   BMI 28.60 kg/m  Wt Readings from  Last 10 Encounters:  03/26/23 141 lb 9.6 oz (64.2 kg)  03/24/23 145 lb (65.8 kg)  03/10/23 143 lb (64.9 kg)  02/26/23 147 lb (66.7 kg)  02/22/23 144 lb (65.3 kg)  02/16/23 144 lb 9.6 oz (65.6 kg)  02/16/23 144 lb (65.3 kg)  02/11/23 144 lb 12.8 oz (65.7 kg)  01/27/23 146 lb (66.2 kg)  01/25/23 146 lb 12.8 oz (66.6 kg)   Vital signs reviewed.  Nursing notes reviewed. Weight trend reviewed. Abnormalities and Problem-Specific physical exam findings:  appear better than in last video visit.  General Appearance:  No acute distress appreciable.   Well-groomed, healthy-appearing female.   Well proportioned with no abnormal fat distribution.  Good muscle tone. Pulmonary:  Normal work of breathing at rest, no respiratory distress apparent. SpO2: 99 %  Musculoskeletal: All extremities are intact.  Neurological:  Awake, alert, oriented, and engaged.  No obvious focal neurological deficits or cognitive impairments.  Sensorium seems unclouded.   Speech is clear and coherent with logical content. Psychiatric:  Appropriate mood, pleasant and cooperative demeanor, thoughtful and engaged during the exam  Results   LABS CBC: Elevated        No results found for any visits on 03/26/23.  Clinical Support on 03/03/2023  Component Date Value   WBC 03/05/2023 12.1 (H)    RBC 03/05/2023 3.97    Hemoglobin 03/05/2023 12.2    HCT 03/05/2023 36.2    MCV 03/05/2023 91.2    MCH 03/05/2023 30.7    MCHC 03/05/2023 33.7    RDW 03/05/2023 12.8    Platelets 03/05/2023 399    MPV 03/05/2023 11.2    Neutro Abs 03/05/2023 8,797 (H)    Lymphs Abs 03/05/2023 2,190    Absolute Monocytes 03/05/2023 883    Eosinophils Absolute 03/05/2023 145    Basophils Absolute 03/05/2023 85    Neutrophils Relative % 03/05/2023 72.7    Total Lymphocyte 03/05/2023 18.1    Monocytes Relative 03/05/2023 7.3    Eosinophils Relative 03/05/2023 1.2    Basophils Relative 03/05/2023 0.7   Office Visit on 02/26/2023  Component Date Value   Color, UA 02/26/2023 YELLOW    Clarity, UA 02/26/2023 CLOUDY    Glucose, UA 02/26/2023 Negative    Bilirubin, UA 02/26/2023 NEG    Ketones, UA 02/26/2023 NEG    Spec Grav, UA 02/26/2023 1.020    Blood, UA 02/26/2023 NEG    pH, UA 02/26/2023 6.0    Protein, UA 02/26/2023 Negative    Urobilinogen, UA 02/26/2023 0.2    Nitrite, UA 02/26/2023 POS    Leukocytes, UA 02/26/2023 Negative   Office Visit on 02/22/2023  Component Date Value   D-Dimer, Quant 02/22/2023 0.29    Troponin I 02/22/2023 <3    Color, Urine 02/22/2023 YELLOW    APPearance 02/22/2023 CLEAR    Specific  Gravity, Urine 02/22/2023 1.015    pH 02/22/2023 6.0    Total Protein, Urine 02/22/2023 NEGATIVE    Urine Glucose 02/22/2023 NEGATIVE    Ketones, ur 02/22/2023 NEGATIVE    Bilirubin Urine 02/22/2023 NEGATIVE    Hgb urine dipstick 02/22/2023 NEGATIVE    Urobilinogen, UA 02/22/2023 0.2    Leukocytes,Ua 02/22/2023 NEGATIVE    Nitrite 02/22/2023 NEGATIVE    WBC, UA 02/22/2023 0-2/hpf    RBC / HPF 02/22/2023 0-2/hpf    Squamous Epithelial / HPF 02/22/2023 Few(5-10/hpf) (A)    Bacteria, UA 02/22/2023 Rare(<10/hpf) (A)    WBC 02/22/2023 13.3 (H)    RBC 02/22/2023 4.18  Hemoglobin 02/22/2023 12.5    HCT 02/22/2023 38.9    MCV 02/22/2023 93.1    MCHC 02/22/2023 32.1    RDW 02/22/2023 13.5    Platelets 02/22/2023 374.0    Neutrophils Relative % 02/22/2023 68.3    Lymphocytes Relative 02/22/2023 22.4    Monocytes Relative 02/22/2023 8.3    Eosinophils Relative 02/22/2023 0.5    Basophils Relative 02/22/2023 0.5    Neutro Abs 02/22/2023 9.1 (H)    Lymphs Abs 02/22/2023 3.0    Monocytes Absolute 02/22/2023 1.1 (H)    Eosinophils Absolute 02/22/2023 0.1    Basophils Absolute 02/22/2023 0.1    Sodium 02/22/2023 139    Potassium 02/22/2023 3.9    Chloride 02/22/2023 101    CO2 02/22/2023 28    Glucose, Bld 02/22/2023 84    BUN 02/22/2023 11    Creatinine, Ser 02/22/2023 0.55    Total Bilirubin 02/22/2023 0.2    Alkaline Phosphatase 02/22/2023 72    AST 02/22/2023 19    ALT 02/22/2023 16    Total Protein 02/22/2023 7.9    Albumin 02/22/2023 4.8    GFR 02/22/2023 109.19    Calcium 02/22/2023 9.9    Color, UA 02/22/2023 yellow    Clarity, UA 02/22/2023 cloudy    Glucose, UA 02/22/2023 Negative    Bilirubin, UA 02/22/2023 Negative    Ketones, UA 02/22/2023 Negative    Spec Grav, UA 02/22/2023 1.020    Blood, UA 02/22/2023 Negative    pH, UA 02/22/2023 6.0    Protein, UA 02/22/2023 Negative    Urobilinogen, UA 02/22/2023 0.2    Nitrite, UA 02/22/2023 Negative    Leukocytes, UA  02/22/2023 Negative    High Sens Troponin I 02/22/2023 4    POC Glucose 02/22/2023 107 (A)   Admission on 02/16/2023, Discharged on 02/16/2023  Component Date Value   WBC 02/16/2023 13.6 (H)    RBC 02/16/2023 3.55 (L)    Hemoglobin 02/16/2023 11.0 (L)    HCT 02/16/2023 33.0 (L)    MCV 02/16/2023 93.0    MCH 02/16/2023 31.0    MCHC 02/16/2023 33.3    RDW 02/16/2023 12.5    Platelets 02/16/2023 250    nRBC 02/16/2023 0.0    Color, Urine 02/16/2023 YELLOW (A)    APPearance 02/16/2023 CLOUDY (A)    Specific Gravity, Urine 02/16/2023 1.016    pH 02/16/2023 5.0    Glucose, UA 02/16/2023 NEGATIVE    Hgb urine dipstick 02/16/2023 NEGATIVE    Bilirubin Urine 02/16/2023 NEGATIVE    Ketones, ur 02/16/2023 NEGATIVE    Protein, ur 02/16/2023 NEGATIVE    Nitrite 02/16/2023 NEGATIVE    Leukocytes,Ua 02/16/2023 TRACE (A)    RBC / HPF 02/16/2023 0-5    WBC, UA 02/16/2023 6-10    Bacteria, UA 02/16/2023 RARE (A)    Squamous Epithelial / HPF 02/16/2023 21-50    Mucus 02/16/2023 PRESENT    Troponin I (High Sensiti* 02/16/2023 <2    Sodium 02/16/2023 137    Potassium 02/16/2023 3.4 (L)    Chloride 02/16/2023 102    CO2 02/16/2023 24    Glucose, Bld 02/16/2023 132 (H)    BUN 02/16/2023 15    Creatinine, Ser 02/16/2023 0.54    Calcium 02/16/2023 8.8 (L)    Total Protein 02/16/2023 6.9    Albumin 02/16/2023 4.1    AST 02/16/2023 23    ALT 02/16/2023 15    Alkaline Phosphatase 02/16/2023 54    Total Bilirubin 02/16/2023 0.6    GFR,  Estimated 02/16/2023 >60    Anion gap 02/16/2023 11    Troponin I (High Sensiti* 02/16/2023 3   Office Visit on 02/11/2023  Component Date Value   Color, UA 02/11/2023 Yellow    Clarity, UA 02/11/2023 clear    Glucose, UA 02/11/2023 Negative    Bilirubin, UA 02/11/2023 negative    Ketones, UA 02/11/2023 15    Spec Grav, UA 02/11/2023 1.025    Blood, UA 02/11/2023 negative    pH, UA 02/11/2023 5.0    Protein, UA 02/11/2023 Negative    Urobilinogen, UA  02/11/2023 0.2    Nitrite, UA 02/11/2023 negative    Leukocytes, UA 02/11/2023 Trace (A)    MICRO NUMBER: 02/11/2023 [id number]    SPECIMEN QUALITY: 02/11/2023 Adequate    Sample Source 02/11/2023 URINE    STATUS: 02/11/2023 FINAL    Result: 02/11/2023                     Value:Mixed genital flora isolated. These superficial bacteria are not indicative of a urinary tract infection. No further organism identification is warranted on this specimen. If clinically indicated, recollect clean-catch, mid-stream urine and transfer  immediately to Urine Culture Transport Tube.    WBC, UA 02/11/2023 0-2/hpf    RBC / HPF 02/11/2023 0-2/hpf    Mucus, UA 02/11/2023 Presence of (A)    Squamous Epithelial / HPF 02/11/2023 Few(5-10/hpf) (A)    Hyaline Casts, UA 02/11/2023 Presence of (A)   Office Visit on 01/25/2023  Component Date Value   WBC 01/25/2023 11.8 (H)    RBC 01/25/2023 4.02    Hemoglobin 01/25/2023 12.5    HCT 01/25/2023 37.6    MCV 01/25/2023 93.4    MCHC 01/25/2023 33.2    RDW 01/25/2023 13.8    Platelets 01/25/2023 375.0    Neutrophils Relative % 01/25/2023 64.2    Lymphocytes Relative 01/25/2023 27.2    Monocytes Relative 01/25/2023 6.8    Eosinophils Relative 01/25/2023 1.1    Basophils Relative 01/25/2023 0.7    Neutro Abs 01/25/2023 7.6    Lymphs Abs 01/25/2023 3.2    Monocytes Absolute 01/25/2023 0.8    Eosinophils Absolute 01/25/2023 0.1    Basophils Absolute 01/25/2023 0.1    Sodium 01/25/2023 140    Potassium 01/25/2023 3.4 (L)    Chloride 01/25/2023 102    CO2 01/25/2023 24    Glucose, Bld 01/25/2023 153 (H)    BUN 01/25/2023 14    Creatinine, Ser 01/25/2023 0.62    Total Bilirubin 01/25/2023 0.2    Alkaline Phosphatase 01/25/2023 66    AST 01/25/2023 20    ALT 01/25/2023 18    Total Protein 01/25/2023 7.9    Albumin 01/25/2023 4.9    GFR 01/25/2023 106.14    Calcium 01/25/2023 9.8    Hgb A1c MFr Bld 01/25/2023 5.8    Vitamin B-12 01/25/2023 411    HAV 1  IGG,TYPE SPECIFIC * 01/25/2023 27.60 (H)    HSV 2 IGG,TYPE SPECIFIC * 01/25/2023 <0.90    RPR Ser Ql 01/25/2023 NON-REACTIVE    Hepatitis B Surface Ag 01/25/2023 Negative    Hep B E Ag 01/25/2023 Negative    Hep B C IgM 01/25/2023 Negative    Hep B Core Total Ab 01/25/2023 Negative    Hep B E Ab 01/25/2023 Non Reactive    Hep B Surface Ab, Qual 01/25/2023 Non Reactive    Hep C Virus Ab 01/25/2023 Non Reactive    HIV Screen 4th Generatio* 01/25/2023 Non  Reactive    Neisseria Gonorrhea 01/25/2023 Negative    Chlamydia 01/25/2023 Negative    Trichomonas 01/25/2023 Negative    Bacterial Vaginitis (gar* 01/25/2023 Negative    Candida Vaginitis 01/25/2023 Positive (A)    Candida Glabrata 01/25/2023 Negative    Comment 01/25/2023 Normal Reference Range Candida Species - Negative    Comment 01/25/2023 Normal Reference Range Candida Galbrata - Negative    Comment 01/25/2023 Normal Reference Range Trichomonas - Negative    Comment 01/25/2023 Normal Reference Ranger Chlamydia - Negative    Comment 01/25/2023 Normal Reference Range Neisseria Gonorrhea - Negative    Comment 01/25/2023 Normal Reference Range Bacterial Vaginosis - Negative    Cholesterol 01/25/2023 255 (H)    Triglycerides 01/25/2023 258.0 (H)    HDL 01/25/2023 81.50    VLDL 01/25/2023 51.6 (H)    Total CHOL/HDL Ratio 01/25/2023 3    NonHDL 01/25/2023 173.08    TSH 01/25/2023 0.82    Direct LDL 01/25/2023 160.0   Office Visit on 12/29/2022  Component Date Value   Color, UA 12/29/2022 yellow    Clarity, UA 12/29/2022 clear    Glucose, UA 12/29/2022 Negative    Bilirubin, UA 12/29/2022 Negative    Ketones, UA 12/29/2022 Negative    Spec Grav, UA 12/29/2022 1.020    Blood, UA 12/29/2022 Negative    pH, UA 12/29/2022 6.0    Protein, UA 12/29/2022 Negative    Urobilinogen, UA 12/29/2022 0.2    Nitrite, UA 12/29/2022 Negative    Leukocytes, UA 12/29/2022 Negative   Video Visit on 11/11/2022  Component Date Value   Neisseria  Gonorrhea 11/11/2022 Negative    Chlamydia 11/11/2022 Negative    Trichomonas 11/11/2022 Negative    Bacterial Vaginitis (gar* 11/11/2022 Positive (A)    Candida Vaginitis 11/11/2022 Negative    Candida Glabrata 11/11/2022 Negative    Comment 11/11/2022 Normal Reference Range Bacterial Vaginosis - Negative    Comment 11/11/2022 Normal Reference Range Candida Species - Negative    Comment 11/11/2022 Normal Reference Range Candida Galbrata - Negative    Comment 11/11/2022 Normal Reference Range Trichomonas - Negative    Comment 11/11/2022 Normal Reference Ranger Chlamydia - Negative    Comment 11/11/2022 Normal Reference Range Neisseria Gonorrhea - Negative   Office Visit on 10/26/2022  Component Date Value   Color, UA 10/26/2022 yellow    Clarity, UA 10/26/2022 clear    Glucose, UA 10/26/2022 Negative    Bilirubin, UA 10/26/2022 negative    Ketones, UA 10/26/2022 positive    Spec Grav, UA 10/26/2022 >=1.030 (A)    Blood, UA 10/26/2022 negative    pH, UA 10/26/2022 5.0    Protein, UA 10/26/2022 Negative    Urobilinogen, UA 10/26/2022 0.2    Nitrite, UA 10/26/2022 negative    Leukocytes, UA 10/26/2022 Trace (A)    MICRO NUMBER: 10/26/2022 [id number]    SPECIMEN QUALITY: 10/26/2022 Adequate    Sample Source 10/26/2022 URINE    STATUS: 10/26/2022 FINAL    Result: 10/26/2022 No Growth    Neisseria Gonorrhea 10/26/2022 Negative    Chlamydia 10/26/2022 Negative    Trichomonas 10/26/2022 Negative    Bacterial Vaginitis-Urine 10/26/2022 Negative    Candida Urine 10/26/2022 Negative    Molecular Comment 10/26/2022 For tests bacteria and/or candida, this specimen does not meet the    Molecular Comment 10/26/2022 strict criteria set by the FDA. The result interpretation should be    Molecular Comment 10/26/2022 considered in conjunction with the patient's clinical history.    Comment 10/26/2022 Normal  Reference Ranger Chlamydia - Negative    Comment 10/26/2022 Normal Reference Range  Neisseria Gonorrhea - Negative    Comment 10/26/2022 Normal Reference Range Trichomonas - Negative    WBC, UA 10/26/2022 0-2/hpf    RBC / HPF 10/26/2022 none seen    Squamous Epithelial / HPF 10/26/2022 Rare(0-4/hpf)    Uric Acid Crys, UA 10/26/2022 Presence of (A)    Amorphous 10/26/2022 Present (A)   Office Visit on 08/17/2022  Component Date Value   Vitamin B-12 08/18/2022 592    Glucose, Bld 08/18/2022 84    BUN 08/18/2022 19    Creat 08/18/2022 0.59    BUN/Creatinine Ratio 08/18/2022 SEE NOTE:    Sodium 08/18/2022 141    Potassium 08/18/2022 3.7    Chloride 08/18/2022 105    CO2 08/18/2022 23    Calcium 08/18/2022 9.8    Total Protein 08/18/2022 7.7    Albumin 08/18/2022 5.0    Globulin 08/18/2022 2.7    AG Ratio 08/18/2022 1.9    Total Bilirubin 08/18/2022 0.3    Alkaline phosphatase (AP* 08/18/2022 77    AST 08/18/2022 17    ALT 08/18/2022 11    WBC 08/18/2022 9.3    RBC 08/18/2022 4.01    Hemoglobin 08/18/2022 12.2    HCT 08/18/2022 35.4    MCV 08/18/2022 88.3    MCH 08/18/2022 30.4    MCHC 08/18/2022 34.5    RDW 08/18/2022 12.5    Platelets 08/18/2022 314    MPV 08/18/2022 11.3    Neutro Abs 08/18/2022 4,371    Lymphs Abs 08/18/2022 3,906 (H)    Absolute Monocytes 08/18/2022 688    Eosinophils Absolute 08/18/2022 233    Basophils Absolute 08/18/2022 102    Neutrophils Relative % 08/18/2022 47    Total Lymphocyte 08/18/2022 42.0    Monocytes Relative 08/18/2022 7.4    Eosinophils Relative 08/18/2022 2.5    Basophils Relative 08/18/2022 1.1    D-Dimer, Quant 08/18/2022 0.22   There may be more visits with results that are not included.   No image results found.   US Abdomen Limited RUQ (LIVER/GB)  Result Date: 03/03/2023 CLINICAL DATA:  Right upper quadrant abdominal pain for several days. EXAM: ULTRASOUND ABDOMEN LIMITED RIGHT UPPER QUADRANT COMPARISON:  September 02, 2017. FINDINGS: Gallbladder: No gallstones or wall thickening visualized. No sonographic  Murphy sign noted by sonographer. Common bile duct: Diameter: 4 mm which is within normal limits. Liver: No focal lesion identified. Within normal limits in parenchymal echogenicity. Portal vein is patent on color Doppler imaging with normal direction of blood flow towards the liver. Other: None. Electronically Signed   By: Lupita Raider M.D.   On: 03/03/2023 15:23   DG Chest 2 View  Result Date: 03/01/2023 CLINICAL DATA:  Left-sided chest pain and shortness of breath for 2 weeks EXAM: CHEST - 2 VIEW COMPARISON:  06/05/2021 FINDINGS: Midline trachea.  Normal heart size and mediastinal contours. Sharp costophrenic angles.  No pneumothorax.  Clear lungs. Loop recorder projects over the left side of the chest. IMPRESSION: No active cardiopulmonary disease. Electronically Signed   By: Jeronimo Greaves M.D.   On: 03/01/2023 10:20   MR Lumbar Spine Wo Contrast  Result Date: 01/15/2023 CLINICAL DATA:  Low back pain radiating to the right hip and leg for the past 4 months. No injury or prior surgery. EXAM: MRI LUMBAR SPINE WITHOUT CONTRAST TECHNIQUE: Multiplanar, multisequence MR imaging of the lumbar spine was performed. No intravenous contrast was administered. COMPARISON:  MRI  lumbar spine dated November 15, 2018. FINDINGS: Segmentation:  Standard. Alignment: Mild dextrocurvature of the upper lumbar spine. No listhesis. Vertebrae:  No fracture, evidence of discitis, or bone lesion. Conus medullaris and cauda equina: Conus extends to the L1-L2 level. Conus and cauda equina appear normal. 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 Otherwise negative. Disc levels: T12-L1 to L3-L4:  Negative. L4-L5: Negative disc. Progressive mild bilateral facet arthropathy. No stenosis. L5-S1: Negative disc. Progressive mild bilateral facet arthropathy. No stenosis. IMPRESSION: 1. Progressive mild bilateral facet arthropathy at L4-L5 and L5-S1. No stenosis or impingement. Electronically Signed   By:  Obie Dredge M.D.   On: 01/15/2023 08:34    US Abdomen Limited RUQ (LIVER/GB)  Result Date: 03/03/2023 CLINICAL DATA:  Right upper quadrant abdominal pain for several days. EXAM: ULTRASOUND ABDOMEN LIMITED RIGHT UPPER QUADRANT COMPARISON:  September 02, 2017. FINDINGS: Gallbladder: No gallstones or wall thickening visualized. No sonographic Murphy sign noted by sonographer. Common bile duct: Diameter: 4 mm which is within normal limits. Liver: No focal lesion identified. Within normal limits in parenchymal echogenicity. Portal vein is patent on color Doppler imaging with normal direction of blood flow towards the liver. Other: None. Electronically Signed   By: Lupita Raider M.D.   On: 03/03/2023 15:23     Additional Info: This encounter employed real-time, collaborative documentation. The patient actively reviewed and updated their medical record on a shared screen, ensuring transparency and facilitating joint problem-solving for the problem list, overview, and plan. This approach promotes accurate, informed care. The treatment plan was discussed and reviewed in detail, including medication safety, potential side effects, and all patient questions. We confirmed understanding and comfort with the plan. Follow-up instructions were established, including contacting the office for any concerns, returning if symptoms worsen, persist, or new symptoms develop, and precautions for potential emergency department visits.

## 2023-03-26 NOTE — Assessment & Plan Note (Deleted)
She has a prescription for Aimovig with remaining refills. We will continue Aimovig as prescribed.

## 2023-03-26 NOTE — Telephone Encounter (Signed)
Received faxed  document FMLA, to be filled out by provider. Patient requested to send it back via Fax . Document is located in providers tray at front office.Please advise

## 2023-03-29 NOTE — Telephone Encounter (Signed)
Form placed on provider's desk for review and signature. 

## 2023-03-30 ENCOUNTER — Other Ambulatory Visit: Payer: Self-pay | Admitting: Family Medicine

## 2023-03-30 DIAGNOSIS — Z79899 Other long term (current) drug therapy: Secondary | ICD-10-CM

## 2023-03-31 ENCOUNTER — Other Ambulatory Visit: Payer: Self-pay

## 2023-03-31 ENCOUNTER — Other Ambulatory Visit: Payer: Self-pay | Admitting: *Deleted

## 2023-03-31 DIAGNOSIS — E538 Deficiency of other specified B group vitamins: Secondary | ICD-10-CM

## 2023-03-31 DIAGNOSIS — D72829 Elevated white blood cell count, unspecified: Secondary | ICD-10-CM | POA: Diagnosis not present

## 2023-03-31 DIAGNOSIS — I10 Essential (primary) hypertension: Secondary | ICD-10-CM

## 2023-03-31 DIAGNOSIS — Z0279 Encounter for issue of other medical certificate: Secondary | ICD-10-CM

## 2023-03-31 DIAGNOSIS — G43909 Migraine, unspecified, not intractable, without status migrainosus: Secondary | ICD-10-CM

## 2023-03-31 NOTE — Telephone Encounter (Signed)
Form faxed to [employer]

## 2023-03-31 NOTE — Addendum Note (Signed)
Addended by: Nash Shearer D on: 03/31/2023 04:46 PM   Modules accepted: Orders

## 2023-03-31 NOTE — Telephone Encounter (Signed)
Form faxed back with confirmation.

## 2023-03-31 NOTE — Telephone Encounter (Signed)
Another FMLA form has been received. Placed in the provider's box

## 2023-04-01 LAB — COMPREHENSIVE METABOLIC PANEL
AG Ratio: 1.4 (calc) (ref 1.0–2.5)
ALT: 9 U/L (ref 6–29)
AST: 17 U/L (ref 10–35)
Albumin: 4.8 g/dL (ref 3.6–5.1)
Alkaline phosphatase (APISO): 74 U/L (ref 31–125)
BUN: 14 mg/dL (ref 7–25)
CO2: 26 mmol/L (ref 20–32)
Calcium: 10.2 mg/dL (ref 8.6–10.2)
Chloride: 99 mmol/L (ref 98–110)
Creat: 0.64 mg/dL (ref 0.50–0.99)
Globulin: 3.5 g/dL (ref 1.9–3.7)
Glucose, Bld: 97 mg/dL (ref 65–99)
Potassium: 3.7 mmol/L (ref 3.5–5.3)
Sodium: 140 mmol/L (ref 135–146)
Total Bilirubin: 0.3 mg/dL (ref 0.2–1.2)
Total Protein: 8.3 g/dL — ABNORMAL HIGH (ref 6.1–8.1)

## 2023-04-01 LAB — CBC WITH DIFFERENTIAL/PLATELET
Absolute Lymphocytes: 3192 {cells}/uL (ref 850–3900)
Absolute Monocytes: 877 {cells}/uL (ref 200–950)
Basophils Absolute: 82 {cells}/uL (ref 0–200)
Basophils Relative: 0.6 %
Eosinophils Absolute: 247 {cells}/uL (ref 15–500)
Eosinophils Relative: 1.8 %
HCT: 36.9 % (ref 35.0–45.0)
Hemoglobin: 12.1 g/dL (ref 11.7–15.5)
MCH: 30.7 pg (ref 27.0–33.0)
MCHC: 32.8 g/dL (ref 32.0–36.0)
MCV: 93.7 fL (ref 80.0–100.0)
MPV: 11.2 fL (ref 7.5–12.5)
Monocytes Relative: 6.4 %
Neutro Abs: 9302 {cells}/uL — ABNORMAL HIGH (ref 1500–7800)
Neutrophils Relative %: 67.9 %
Platelets: 381 10*3/uL (ref 140–400)
RBC: 3.94 10*6/uL (ref 3.80–5.10)
RDW: 12.3 % (ref 11.0–15.0)
Total Lymphocyte: 23.3 %
WBC: 13.7 10*3/uL — ABNORMAL HIGH (ref 3.8–10.8)

## 2023-04-01 LAB — VITAMIN B12: Vitamin B-12: 865 pg/mL (ref 200–1100)

## 2023-04-01 LAB — PATHOLOGIST SMEAR REVIEW: Path Review: 8

## 2023-04-02 ENCOUNTER — Encounter: Payer: Self-pay | Admitting: Internal Medicine

## 2023-04-02 ENCOUNTER — Ambulatory Visit (INDEPENDENT_AMBULATORY_CARE_PROVIDER_SITE_OTHER): Payer: Medicaid Other | Admitting: Internal Medicine

## 2023-04-02 VITALS — BP 123/82 | HR 94 | Temp 96.9°F | Ht 59.0 in | Wt 141.2 lb

## 2023-04-02 DIAGNOSIS — F17200 Nicotine dependence, unspecified, uncomplicated: Secondary | ICD-10-CM | POA: Diagnosis not present

## 2023-04-02 DIAGNOSIS — J4521 Mild intermittent asthma with (acute) exacerbation: Secondary | ICD-10-CM

## 2023-04-02 DIAGNOSIS — F1123 Opioid dependence with withdrawal: Secondary | ICD-10-CM

## 2023-04-02 DIAGNOSIS — M5127 Other intervertebral disc displacement, lumbosacral region: Secondary | ICD-10-CM | POA: Diagnosis not present

## 2023-04-02 DIAGNOSIS — F1120 Opioid dependence, uncomplicated: Secondary | ICD-10-CM | POA: Diagnosis not present

## 2023-04-02 MED ORDER — KETOROLAC TROMETHAMINE 10 MG PO TABS: 10.0000 mg | ORAL_TABLET | Freq: Four times a day (QID) | ORAL | 0 refills | Status: DC | PRN

## 2023-04-02 MED ORDER — KETOROLAC TROMETHAMINE 60 MG/2ML IM SOLN: 60.0000 mg | Freq: Once | INTRAMUSCULAR | Status: DC

## 2023-04-02 MED ORDER — BUPRENORPHINE HCL-NALOXONE HCL 8-2 MG SL FILM: ORAL_FILM | SUBLINGUAL | 0 refills | Status: DC

## 2023-04-03 NOTE — Progress Notes (Signed)
[facility]: [id number]   -- Medical Office Visit --  Patient:  Laurie Roman      Age: 47 y.o.       Sex:  female  Date:   04/02/2023 Today's Healthcare Provider: [name], MD  ============================================================================================= Assessment Plan    Assessment & Plan Uncomplicated opioid dependence [facility]) She is on Suboxone for chronic pain and opioid dependence and expresses a desire to taper off medication. Suboxone will continue at the current dose with a month's supply provided. She is advised against attempting a rapid taper and recommended to reduce no more than a quarter film per day per week if desired. A follow-up appointment in one month is scheduled to assess progress and adjust the plan as needed. 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 Toradol tablets are prescribed for short-term use. A letter of support for work accommodations, recommending a height-adjustable chair for frequent changes in sitting and standing positions, will be provided. Mild intermittent asthma with acute exacerbation She reports shortness of breath and is currently using a rescue inhaler. She will provide the name of her preferred daily inhaler, which will be prescribed if possible. Smoking She is a current smoker and declined smoking cessation counseling at this time. No change in plan.   Diagnoses and all orders for this visit: Uncomplicated opioid dependence (HCC) -     ketorolac (TORADOL) injection 60 mg -     Buprenorphine HCl-Naloxone HCl (SUBOXONE) 8-2 MG FILM; Take 1.5 films daily ok to divide as needed.  But over time you should notice that it last all day and you can just take it all at  once. 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  Recommended follow-up:  30 days  Patient Care Team: Jeani Sow, MD as PCP - General (Family Medicine) Shaune Leeks as Social Worker   Subjective   47 y.o. female who has Migraine; ADHD; Essential hypertension; Mild obstructive sleep apnea; Controlled substance agreement signed; Gastroesophageal reflux disease without esophagitis; Left sided sciatica; Asthma; Maxillary sinusitis; Anxiety; Primary insomnia; Depression, major, single episode, severe (HCC); Cervicalgia; Chronic dental pain; Opioid dependence (HCC); B12 deficiency; Urinary incontinence; Flank pain; Uric acid crystalluria; Right sided sciatica; Intertrigo; Chronic bilateral low back pain with sciatica; Chronic right hip pain; and Tachycardia on their problem list.. Main reasons for visit/main concerns/chief complaint: No chief complaint on file. ------------------------------------------------------------------------------------------------------------------------ AI-Extracted: Discussed the use of AI scribe software for clinical note transcription with the patient, who gave verbal consent to proceed.  History of Present Illness   The patient, with a history of hip arthritis and sciatica, presents with increased hip pain and back discomfort. She attributes this to increased standing and physical activity at work over the past two days. The patient describes the pain as localized to the hip area, particularly when bending, but also acknowledges that it could be radiating from the back. She has previously had an MRI for the sciatica, but the outcome of this is not discussed.  The patient also reports shortness of  breath and has been  using an inhaler for relief. She expresses interest in a daily inhaler for more consistent management of this symptom.  In addition to these issues, the patient has been managing her pain with Toradol injections and requests a continuation of this treatment. She also requests Toradol pills for additional pain relief, acknowledging past stomach discomfort with prolonged use.  The patient is currently on Suboxone for chronic pain and dependency management. She expresses a desire to taper off this medication in the future, but also acknowledges the need for its continued use in the present due to her current pain flare-up. The patient has been taking one and a half films of Suboxone daily, occasionally adjusting the dose based on her pain levels. She expresses concern about exceeding the maximum daily dose of two films.      Note that patient  has a past medical history of ADD (attention deficit disorder), Anxiety, Asthma, Blood transfusion without reported diagnosis, Depression, GERD (gastroesophageal reflux disease), IBS (irritable bowel syndrome), Insomnia, Lumbago, Migraine, Opioid dependence (HCC) (08/21/2022), Other chest pain (08/17/2022), Sciatica, Sleep apnea, Tietze's disease, and Urinary incontinence (10/26/2022).  Problem list overviews that were updated at today's visit:No problems updated.  Med reconciliation: Current Outpatient Medications on File Prior to Visit  Medication Sig   albuterol (VENTOLIN HFA) 108 (90 Base) MCG/ACT inhaler Inhale 2 puffs into the lungs every 6 (six) hours as needed for wheezing or shortness of breath.   amLODipine (NORVASC) 10 MG tablet Take 1 tablet (10 mg total) by mouth daily. Replaces amlodipine 5, for uncontrolled blood pressure.   amphetamine-dextroamphetamine (ADDERALL) 20 MG tablet Take 1 tablet (20 mg total) by mouth 2 (two) times daily.   ARIPiprazole (ABILIFY) 2 MG tablet Take 1 tablet (2 mg total) by mouth daily as needed.   budesonide-formoterol  (SYMBICORT) 160-4.5 MCG/ACT inhaler Inhale 2 puffs into the lungs 2 (two) times daily.   Erenumab-aooe (AIMOVIG) 140 MG/ML SOAJ Inject 140 mg into the skin every 30 (thirty) days.   escitalopram (LEXAPRO) 20 MG tablet Take 1 tablet (20 mg total) by mouth daily.   fluconazole (DIFLUCAN) 150 MG tablet Take 1 tablet (150 mg total) by mouth every three (3) days as needed.   fluticasone (FLONASE) 50 MCG/ACT nasal spray Place 1 spray into both nostrils daily.   gabapentin (NEURONTIN) 800 MG tablet Take 1 tablet (800 mg total) by mouth 3 (three) times daily. Replaces prior dosing schedule.   losartan (COZAAR) 100 MG tablet Take 1 tablet (100 mg total) by mouth daily.   magic mouthwash (nystatin, lidocaine, diphenhydrAMINE, alum & mag hydroxide) suspension Swish and swallow 5 mLs 4 (four) times daily.   metoprolol tartrate (LOPRESSOR) 25 MG tablet Take 1 tablet (25 mg total) by mouth 2 (two) times daily.   nystatin (MYCOSTATIN) 100000 UNIT/ML suspension Take 5 mLs (500,000 Units total) by mouth 4 (four) times daily. Swish and hold in mouth few seconds and swallow   nystatin (MYCOSTATIN/NYSTOP) powder Apply 1 Application topically 3 (three) times daily.   nystatin cream (MYCOSTATIN) Apply 1 Application topically 2 (two) times daily.   ondansetron (ZOFRAN) 4 MG tablet Take 1 tablet (4 mg total) by mouth every 8 (eight) hours as needed for nausea or vomiting.   pantoprazole (PROTONIX) 40 MG tablet Take 1 tablet (40 mg total) by mouth daily.   potassium chloride (KLOR-CON M) 20 MEQ tablet Take 1 tablet (20 mEq total) by mouth daily.   rizatriptan (MAXALT) 10 MG tablet Take 1 tablet (10  mg total) by mouth as needed for migraine. May repeat in 2 hours if needed   tiZANidine (ZANAFLEX) 4 MG tablet Take 1 tablet (4 mg total) by mouth every 6 (six) hours as needed for muscle spasms.   chlorhexidine (PERIDEX) 0.12 % solution Use as directed 15 mLs in the mouth or throat 2 (two) times daily. (Patient not taking:  Reported on 04/02/2023)   doxepin (SINEQUAN) 10 MG capsule Take 1 capsule (10 mg total) by mouth at bedtime. (Patient not taking: Reported on 04/02/2023)   Current Facility-Administered Medications on File Prior to Visit  Medication   ondansetron (ZOFRAN-ODT) disintegrating tablet 4 mg   Medications Discontinued During This Encounter  Medication Reason   Buprenorphine HCl-Naloxone HCl (SUBOXONE) 8-2 MG FILM Reorder    Objective   Physical Exam  BP 123/82 (BP Location: Left Arm, Patient Position: Sitting)   Pulse 94   Temp (!) 96.9 F (36.1 C) (Temporal)   Ht 4\' 11"  (1.499 m)   Wt 141 lb 3.2 oz (64 kg)   SpO2 98%   BMI 28.52 kg/m  Wt Readings from Last 10 Encounters:  04/02/23 141 lb 3.2 oz (64 kg)  03/26/23 141 lb 9.6 oz (64.2 kg)  03/24/23 145 lb (65.8 kg)  03/10/23 143 lb (64.9 kg)  02/26/23 147 lb (66.7 kg)  02/22/23 144 lb (65.3 kg)  02/16/23 144 lb 9.6 oz (65.6 kg)  02/16/23 144 lb (65.3 kg)  02/11/23 144 lb 12.8 oz (65.7 kg)  01/27/23 146 lb (66.2 kg)   Vital signs reviewed.  Nursing notes reviewed. Weight trend reviewed. Abnormalities and Problem-Specific physical exam findings:  stressed, grimaces in pain with sit to stand  General Appearance:  No acute distress appreciable.   Well-groomed, healthy-appearing female.  Well proportioned with no abnormal fat distribution.  Good muscle tone. Pulmonary:  Normal work of breathing at rest, no respiratory distress apparent. SpO2: 98 %  Musculoskeletal: All extremities are intact.  Neurological:  Awake, alert, oriented, and engaged.  No obvious focal neurological deficits or cognitive impairments.  Sensorium seems unclouded.   Speech is clear and coherent with logical content. Psychiatric:  Appropriate mood, pleasant and cooperative demeanor, thoughtful and engaged during the exam.  Results   RADIOLOGY Lumbar spine MRI: Severe lumbar degenerative disc disease        No results found for any visits on 04/02/23.  Orders  Only on 03/31/2023  Component Date Value   Glucose, Bld 03/31/2023 97    BUN 03/31/2023 14    Creat 03/31/2023 0.64    BUN/Creatinine Ratio 03/31/2023 SEE NOTE:    Sodium 03/31/2023 140    Potassium 03/31/2023 3.7    Chloride 03/31/2023 99    CO2 03/31/2023 26    Calcium 03/31/2023 10.2    Total Protein 03/31/2023 8.3 (H)    Albumin 03/31/2023 4.8    Globulin 03/31/2023 3.5    AG Ratio 03/31/2023 1.4    Total Bilirubin 03/31/2023 0.3    Alkaline phosphatase (AP* 03/31/2023 74    AST 03/31/2023 17    ALT 03/31/2023 9    Vitamin B-12 03/31/2023 865    WBC 03/31/2023 13.7 (H)    RBC 03/31/2023 3.94    Hemoglobin 03/31/2023 12.1    HCT 03/31/2023 36.9    MCV 03/31/2023 93.7    MCH 03/31/2023 30.7    MCHC 03/31/2023 32.8    RDW 03/31/2023 12.3    Platelets 03/31/2023 381    MPV 03/31/2023 11.2    Neutro Abs  03/31/2023 9,302 (H)    Absolute Lymphocytes 03/31/2023 3,192    Absolute Monocytes 03/31/2023 877    Eosinophils Absolute 03/31/2023 247    Basophils Absolute 03/31/2023 82    Neutrophils Relative % 03/31/2023 67.9    Total Lymphocyte 03/31/2023 23.3    Monocytes Relative 03/31/2023 6.4    Eosinophils Relative 03/31/2023 1.8    Basophils Relative 03/31/2023 0.6   Office Visit on 03/26/2023  Component Date Value   Path Review 03/31/2023 8   Clinical Support on 03/03/2023  Component Date Value   WBC 03/05/2023 12.1 (H)    RBC 03/05/2023 3.97    Hemoglobin 03/05/2023 12.2    HCT 03/05/2023 36.2    MCV 03/05/2023 91.2    MCH 03/05/2023 30.7    MCHC 03/05/2023 33.7    RDW 03/05/2023 12.8    Platelets 03/05/2023 399    MPV 03/05/2023 11.2    Neutro Abs 03/05/2023 8,797 (H)    Lymphs Abs 03/05/2023 2,190    Absolute Monocytes 03/05/2023 883    Eosinophils Absolute 03/05/2023 145    Basophils Absolute 03/05/2023 85    Neutrophils Relative % 03/05/2023 72.7    Total Lymphocyte 03/05/2023 18.1    Monocytes Relative 03/05/2023 7.3    Eosinophils Relative 03/05/2023  1.2    Basophils Relative 03/05/2023 0.7   Office Visit on 02/26/2023  Component Date Value   Color, UA 02/26/2023 YELLOW    Clarity, UA 02/26/2023 CLOUDY    Glucose, UA 02/26/2023 Negative    Bilirubin, UA 02/26/2023 NEG    Ketones, UA 02/26/2023 NEG    Spec Grav, UA 02/26/2023 1.020    Blood, UA 02/26/2023 NEG    pH, UA 02/26/2023 6.0    Protein, UA 02/26/2023 Negative    Urobilinogen, UA 02/26/2023 0.2    Nitrite, UA 02/26/2023 POS    Leukocytes, UA 02/26/2023 Negative   Office Visit on 02/22/2023  Component Date Value   D-Dimer, Quant 02/22/2023 0.29    Troponin I 02/22/2023 <3    Color, Urine 02/22/2023 YELLOW    APPearance 02/22/2023 CLEAR    Specific Gravity, Urine 02/22/2023 1.015    pH 02/22/2023 6.0    Total Protein, Urine 02/22/2023 NEGATIVE    Urine Glucose 02/22/2023 NEGATIVE    Ketones, ur 02/22/2023 NEGATIVE    Bilirubin Urine 02/22/2023 NEGATIVE    Hgb urine dipstick 02/22/2023 NEGATIVE    Urobilinogen, UA 02/22/2023 0.2    Leukocytes,Ua 02/22/2023 NEGATIVE    Nitrite 02/22/2023 NEGATIVE    WBC, UA 02/22/2023 0-2/hpf    RBC / HPF 02/22/2023 0-2/hpf    Squamous Epithelial / HPF 02/22/2023 Few(5-10/hpf) (A)    Bacteria, UA 02/22/2023 Rare(<10/hpf) (A)    WBC 02/22/2023 13.3 (H)    RBC 02/22/2023 4.18    Hemoglobin 02/22/2023 12.5    HCT 02/22/2023 38.9    MCV 02/22/2023 93.1    MCHC 02/22/2023 32.1    RDW 02/22/2023 13.5    Platelets 02/22/2023 374.0    Neutrophils Relative % 02/22/2023 68.3    Lymphocytes Relative 02/22/2023 22.4    Monocytes Relative 02/22/2023 8.3    Eosinophils Relative 02/22/2023 0.5    Basophils Relative 02/22/2023 0.5    Neutro Abs 02/22/2023 9.1 (H)    Lymphs Abs 02/22/2023 3.0    Monocytes Absolute 02/22/2023 1.1 (H)    Eosinophils Absolute 02/22/2023 0.1    Basophils Absolute 02/22/2023 0.1    Sodium 02/22/2023 139    Potassium 02/22/2023 3.9    Chloride 02/22/2023 101  CO2 02/22/2023 28    Glucose, Bld 02/22/2023 84     BUN 02/22/2023 11    Creatinine, Ser 02/22/2023 0.55    Total Bilirubin 02/22/2023 0.2    Alkaline Phosphatase 02/22/2023 72    AST 02/22/2023 19    ALT 02/22/2023 16    Total Protein 02/22/2023 7.9    Albumin 02/22/2023 4.8    GFR 02/22/2023 109.19    Calcium 02/22/2023 9.9    Color, UA 02/22/2023 yellow    Clarity, UA 02/22/2023 cloudy    Glucose, UA 02/22/2023 Negative    Bilirubin, UA 02/22/2023 Negative    Ketones, UA 02/22/2023 Negative    Spec Grav, UA 02/22/2023 1.020    Blood, UA 02/22/2023 Negative    pH, UA 02/22/2023 6.0    Protein, UA 02/22/2023 Negative    Urobilinogen, UA 02/22/2023 0.2    Nitrite, UA 02/22/2023 Negative    Leukocytes, UA 02/22/2023 Negative    High Sens Troponin I 02/22/2023 4    POC Glucose 02/22/2023 107 (A)   Admission on 02/16/2023, Discharged on 02/16/2023  Component Date Value   WBC 02/16/2023 13.6 (H)    RBC 02/16/2023 3.55 (L)    Hemoglobin 02/16/2023 11.0 (L)    HCT 02/16/2023 33.0 (L)    MCV 02/16/2023 93.0    MCH 02/16/2023 31.0    MCHC 02/16/2023 33.3    RDW 02/16/2023 12.5    Platelets 02/16/2023 250    nRBC 02/16/2023 0.0    Color, Urine 02/16/2023 YELLOW (A)    APPearance 02/16/2023 CLOUDY (A)    Specific Gravity, Urine 02/16/2023 1.016    pH 02/16/2023 5.0    Glucose, UA 02/16/2023 NEGATIVE    Hgb urine dipstick 02/16/2023 NEGATIVE    Bilirubin Urine 02/16/2023 NEGATIVE    Ketones, ur 02/16/2023 NEGATIVE    Protein, ur 02/16/2023 NEGATIVE    Nitrite 02/16/2023 NEGATIVE    Leukocytes,Ua 02/16/2023 TRACE (A)    RBC / HPF 02/16/2023 0-5    WBC, UA 02/16/2023 6-10    Bacteria, UA 02/16/2023 RARE (A)    Squamous Epithelial / HPF 02/16/2023 21-50    Mucus 02/16/2023 PRESENT    Troponin I (High Sensiti* 02/16/2023 <2    Sodium 02/16/2023 137    Potassium 02/16/2023 3.4 (L)    Chloride 02/16/2023 102    CO2 02/16/2023 24    Glucose, Bld 02/16/2023 132 (H)    BUN 02/16/2023 15    Creatinine, Ser 02/16/2023 0.54     Calcium 02/16/2023 8.8 (L)    Total Protein 02/16/2023 6.9    Albumin 02/16/2023 4.1    AST 02/16/2023 23    ALT 02/16/2023 15    Alkaline Phosphatase 02/16/2023 54    Total Bilirubin 02/16/2023 0.6    GFR, Estimated 02/16/2023 >60    Anion gap 02/16/2023 11    Troponin I (High Sensiti* 02/16/2023 3   Office Visit on 02/11/2023  Component Date Value   Color, UA 02/11/2023 Yellow    Clarity, UA 02/11/2023 clear    Glucose, UA 02/11/2023 Negative    Bilirubin, UA 02/11/2023 negative    Ketones, UA 02/11/2023 15    Spec Grav, UA 02/11/2023 1.025    Blood, UA 02/11/2023 negative    pH, UA 02/11/2023 5.0    Protein, UA 02/11/2023 Negative    Urobilinogen, UA 02/11/2023 0.2    Nitrite, UA 02/11/2023 negative    Leukocytes, UA 02/11/2023 Trace (A)    MICRO NUMBER: 02/11/2023 [id number]    SPECIMEN QUALITY:  02/11/2023 Adequate    Sample Source 02/11/2023 URINE    STATUS: 02/11/2023 FINAL    Result: 02/11/2023                     Value:Mixed genital flora isolated. These superficial bacteria are not indicative of a urinary tract infection. No further organism identification is warranted on this specimen. If clinically indicated, recollect clean-catch, mid-stream urine and transfer  immediately to Urine Culture Transport Tube.    WBC, UA 02/11/2023 0-2/hpf    RBC / HPF 02/11/2023 0-2/hpf    Mucus, UA 02/11/2023 Presence of (A)    Squamous Epithelial / HPF 02/11/2023 Few(5-10/hpf) (A)    Hyaline Casts, UA 02/11/2023 Presence of (A)   Office Visit on 01/25/2023  Component Date Value   WBC 01/25/2023 11.8 (H)    RBC 01/25/2023 4.02    Hemoglobin 01/25/2023 12.5    HCT 01/25/2023 37.6    MCV 01/25/2023 93.4    MCHC 01/25/2023 33.2    RDW 01/25/2023 13.8    Platelets 01/25/2023 375.0    Neutrophils Relative % 01/25/2023 64.2    Lymphocytes Relative 01/25/2023 27.2    Monocytes Relative 01/25/2023 6.8    Eosinophils Relative 01/25/2023 1.1    Basophils Relative 01/25/2023 0.7     Neutro Abs 01/25/2023 7.6    Lymphs Abs 01/25/2023 3.2    Monocytes Absolute 01/25/2023 0.8    Eosinophils Absolute 01/25/2023 0.1    Basophils Absolute 01/25/2023 0.1    Sodium 01/25/2023 140    Potassium 01/25/2023 3.4 (L)    Chloride 01/25/2023 102    CO2 01/25/2023 24    Glucose, Bld 01/25/2023 153 (H)    BUN 01/25/2023 14    Creatinine, Ser 01/25/2023 0.62    Total Bilirubin 01/25/2023 0.2    Alkaline Phosphatase 01/25/2023 66    AST 01/25/2023 20    ALT 01/25/2023 18    Total Protein 01/25/2023 7.9    Albumin 01/25/2023 4.9    GFR 01/25/2023 106.14    Calcium 01/25/2023 9.8    Hgb A1c MFr Bld 01/25/2023 5.8    Vitamin B-12 01/25/2023 411    HAV 1 IGG,TYPE SPECIFIC * 01/25/2023 27.60 (H)    HSV 2 IGG,TYPE SPECIFIC * 01/25/2023 <0.90    RPR Ser Ql 01/25/2023 NON-REACTIVE    Hepatitis B Surface Ag 01/25/2023 Negative    Hep B E Ag 01/25/2023 Negative    Hep B C IgM 01/25/2023 Negative    Hep B Core Total Ab 01/25/2023 Negative    Hep B E Ab 01/25/2023 Non Reactive    Hep B Surface Ab, Qual 01/25/2023 Non Reactive    Hep C Virus Ab 01/25/2023 Non Reactive    HIV Screen 4th Generatio* 01/25/2023 Non Reactive    Neisseria Gonorrhea 01/25/2023 Negative    Chlamydia 01/25/2023 Negative    Trichomonas 01/25/2023 Negative    Bacterial Vaginitis (gar* 01/25/2023 Negative    Candida Vaginitis 01/25/2023 Positive (A)    Candida Glabrata 01/25/2023 Negative    Comment 01/25/2023 Normal Reference Range Candida Species - Negative    Comment 01/25/2023 Normal Reference Range Candida Galbrata - Negative    Comment 01/25/2023 Normal Reference Range Trichomonas - Negative    Comment 01/25/2023 Normal Reference Ranger Chlamydia - Negative    Comment 01/25/2023 Normal Reference Range Neisseria Gonorrhea - Negative    Comment 01/25/2023 Normal Reference Range Bacterial Vaginosis - Negative    Cholesterol 01/25/2023 255 (H)    Triglycerides 01/25/2023 258.0 (H)  HDL 01/25/2023 81.50     VLDL 01/25/2023 51.6 (H)    Total CHOL/HDL Ratio 01/25/2023 3    NonHDL 01/25/2023 173.08    TSH 01/25/2023 0.82    Direct LDL 01/25/2023 160.0   Office Visit on 12/29/2022  Component Date Value   Color, UA 12/29/2022 yellow    Clarity, UA 12/29/2022 clear    Glucose, UA 12/29/2022 Negative    Bilirubin, UA 12/29/2022 Negative    Ketones, UA 12/29/2022 Negative    Spec Grav, UA 12/29/2022 1.020    Blood, UA 12/29/2022 Negative    pH, UA 12/29/2022 6.0    Protein, UA 12/29/2022 Negative    Urobilinogen, UA 12/29/2022 0.2    Nitrite, UA 12/29/2022 Negative    Leukocytes, UA 12/29/2022 Negative   Video Visit on 11/11/2022  Component Date Value   Neisseria Gonorrhea 11/11/2022 Negative    Chlamydia 11/11/2022 Negative    Trichomonas 11/11/2022 Negative    Bacterial Vaginitis (gar* 11/11/2022 Positive (A)    Candida Vaginitis 11/11/2022 Negative    Candida Glabrata 11/11/2022 Negative    Comment 11/11/2022 Normal Reference Range Bacterial Vaginosis - Negative    Comment 11/11/2022 Normal Reference Range Candida Species - Negative    Comment 11/11/2022 Normal Reference Range Candida Galbrata - Negative    Comment 11/11/2022 Normal Reference Range Trichomonas - Negative    Comment 11/11/2022 Normal Reference Ranger Chlamydia - Negative    Comment 11/11/2022 Normal Reference Range Neisseria Gonorrhea - Negative   There may be more visits with results that are not included.   No image results found.   US Abdomen Limited RUQ (LIVER/GB)  Result Date: 03/03/2023 CLINICAL DATA:  Right upper quadrant abdominal pain for several days. EXAM: ULTRASOUND ABDOMEN LIMITED RIGHT UPPER QUADRANT COMPARISON:  September 02, 2017. FINDINGS: Gallbladder: No gallstones or wall thickening visualized. No sonographic Murphy sign noted by sonographer. Common bile duct: Diameter: 4 mm which is within normal limits. Liver: No focal lesion identified. Within normal limits in parenchymal echogenicity. Portal vein  is patent on color Doppler imaging with normal direction of blood flow towards the liver. Other: None. Electronically Signed   By: Lupita Raider M.D.   On: 03/03/2023 15:23   DG Chest 2 View  Result Date: 03/01/2023 CLINICAL DATA:  Left-sided chest pain and shortness of breath for 2 weeks EXAM: CHEST - 2 VIEW COMPARISON:  06/05/2021 FINDINGS: Midline trachea.  Normal heart size and mediastinal contours. Sharp costophrenic angles.  No pneumothorax.  Clear lungs. Loop recorder projects over the left side of the chest. IMPRESSION: No active cardiopulmonary disease. Electronically Signed   By: Jeronimo Greaves M.D.   On: 03/01/2023 10:20   MR Lumbar Spine Wo Contrast  Result Date: 01/15/2023 CLINICAL DATA:  Low back pain radiating to the right hip and leg for the past 4 months. No injury or prior surgery. EXAM: MRI LUMBAR SPINE WITHOUT CONTRAST TECHNIQUE: Multiplanar, multisequence MR imaging of the lumbar spine was performed. No intravenous contrast was administered. COMPARISON:  MRI lumbar spine dated November 15, 2018. FINDINGS: Segmentation:  Standard. Alignment: Mild dextrocurvature of the upper lumbar spine. No listhesis. Vertebrae:  No fracture, evidence of discitis, or bone lesion. Conus medullaris and cauda equina: Conus extends to the L1-L2 level. Conus and cauda equina appear normal. 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 Otherwise negative. Disc levels: T12-L1 to L3-L4:  Negative. L4-L5: Negative disc. Progressive mild bilateral facet arthropathy. No stenosis. L5-S1: Negative disc. Progressive mild bilateral facet  arthropathy. No stenosis. IMPRESSION: 1. Progressive mild bilateral facet arthropathy at L4-L5 and L5-S1. No stenosis or impingement. Electronically Signed   By: Obie Dredge M.D.   On: 01/15/2023 08:34    US Abdomen Limited RUQ (LIVER/GB)  Result Date: 03/03/2023 CLINICAL DATA:  Right upper quadrant abdominal pain for several days. EXAM: ULTRASOUND  ABDOMEN LIMITED RIGHT UPPER QUADRANT COMPARISON:  September 02, 2017. FINDINGS: Gallbladder: No gallstones or wall thickening visualized. No sonographic Murphy sign noted by sonographer. Common bile duct: Diameter: 4 mm which is within normal limits. Liver: No focal lesion identified. Within normal limits in parenchymal echogenicity. Portal vein is patent on color Doppler imaging with normal direction of blood flow towards the liver. Other: None. Electronically Signed   By: Lupita Raider M.D.   On: 03/03/2023 15:23     Additional Info: This encounter employed real-time, collaborative documentation. The patient actively reviewed and updated their medical record on a shared screen, ensuring transparency and facilitating joint problem-solving for the problem list, overview, and plan. This approach promotes accurate, informed care. The treatment plan was discussed and reviewed in detail, including medication safety, potential side effects, and all patient questions. We confirmed understanding and comfort with the plan. Follow-up instructions were established, including contacting the office for any concerns, returning if symptoms worsen, persist, or new symptoms develop, and precautions for potential emergency department visits.

## 2023-04-03 NOTE — Assessment & Plan Note (Signed)
She is on Suboxone for chronic pain and opioid dependence and expresses a desire to taper off medication. Suboxone will continue at the current dose with a month's supply provided. She is advised against attempting a rapid taper and recommended to reduce no more than a quarter film per day per week if desired. A follow-up appointment in one month is scheduled to assess progress and adjust the plan as needed.

## 2023-04-03 NOTE — Patient Instructions (Addendum)
VISIT SUMMARY:  During today's visit, we discussed your increased hip and back pain, shortness of breath, and current pain management strategies. We also reviewed your use of Suboxone for chronic pain and dependency management, and your interest in tapering off this medication in the future.  YOUR PLAN:  -LOWER BACK PAIN: Your lower back pain, which is likely due to increased standing at work and may be radiating to your hip, will be managed with a Toradol injection today for immediate relief. You are also prescribed Toradol tablets for short-term use. Additionally, a letter of support for work accommodations, recommending a height-adjustable chair, will be provided to help manage your pain at work.  -OPIOID DEPENDENCE: You are currently taking Suboxone for chronic pain and opioid dependence. While you have expressed a desire to taper off this medication, it is important to do so gradually. You will continue with your current dose, and a month's supply has been provided. If you wish to taper, reduce by no more than a quarter film per day per week. We will reassess your progress in one month.  Please send pictures to MyChart or bring container with uses/unused films to appointments.   -ASTHMA: You have reported shortness of breath and are using a rescue inhaler. Please provide the name of your preferred daily inhaler, and we will prescribe it if possible.  -SMOKING: You are a current smoker and have declined smoking cessation counseling at this time. No changes to your current plan are needed.  INSTRUCTIONS:  Please follow up in one month to assess your progress with Suboxone and to adjust your pain management plan as needed. If you experience any new or worsening symptoms, contact our office immediately.

## 2023-04-03 NOTE — Assessment & Plan Note (Signed)
She reports shortness of breath and is currently using a rescue inhaler. She will provide the name of her preferred daily inhaler, which will be prescribed if possible.

## 2023-04-04 NOTE — Progress Notes (Signed)
Notify patient if not seen in MyChart. This  is very reassuring that whatever was going on with you heart before seems to have gone away, and was likely a temporary response to some sort of stress or infection that has self-resolved.  Going forward, plan is for me to manage suboxone only and leave the remainder of the care to Primary Care Provider (PCP) Dr. Ruthine Dose.  Of course I remain available for acute illnesses as well when Dr. Ruthine Dose is not here

## 2023-04-05 ENCOUNTER — Other Ambulatory Visit: Payer: Self-pay | Admitting: Family Medicine

## 2023-04-05 ENCOUNTER — Ambulatory Visit (INDEPENDENT_AMBULATORY_CARE_PROVIDER_SITE_OTHER): Payer: Medicaid Other

## 2023-04-05 DIAGNOSIS — M5127 Other intervertebral disc displacement, lumbosacral region: Secondary | ICD-10-CM | POA: Diagnosis not present

## 2023-04-05 DIAGNOSIS — G43909 Migraine, unspecified, not intractable, without status migrainosus: Secondary | ICD-10-CM

## 2023-04-05 DIAGNOSIS — D72829 Elevated white blood cell count, unspecified: Secondary | ICD-10-CM

## 2023-04-05 MED ORDER — KETOROLAC TROMETHAMINE 60 MG/2ML IM SOLN
60.0000 mg | Freq: Once | INTRAMUSCULAR | Status: AC
  Administered 2023-04-05: 60 mg via INTRAMUSCULAR

## 2023-04-05 NOTE — Progress Notes (Signed)
Patient was given toradol injection in the right quadricep today. Patient tolerated injection well. Governor Rooks, CMA

## 2023-04-05 NOTE — Addendum Note (Signed)
Addended by: Donzetta Starch on: 04/05/2023 05:05 PM   Modules accepted: Orders

## 2023-04-05 NOTE — Progress Notes (Signed)
D/w pt.  Stable since 2013, but some elliptocytes and clumped plts.  Per path review, will see if iron can be added

## 2023-04-16 ENCOUNTER — Other Ambulatory Visit: Payer: Self-pay

## 2023-04-16 ENCOUNTER — Other Ambulatory Visit (INDEPENDENT_AMBULATORY_CARE_PROVIDER_SITE_OTHER): Payer: Self-pay

## 2023-04-16 ENCOUNTER — Other Ambulatory Visit: Payer: Medicaid Other

## 2023-04-16 DIAGNOSIS — M5441 Lumbago with sciatica, right side: Secondary | ICD-10-CM | POA: Diagnosis not present

## 2023-04-16 DIAGNOSIS — E538 Deficiency of other specified B group vitamins: Secondary | ICD-10-CM

## 2023-04-16 DIAGNOSIS — M5442 Lumbago with sciatica, left side: Secondary | ICD-10-CM | POA: Diagnosis not present

## 2023-04-16 DIAGNOSIS — M5127 Other intervertebral disc displacement, lumbosacral region: Secondary | ICD-10-CM

## 2023-04-16 DIAGNOSIS — D72829 Elevated white blood cell count, unspecified: Secondary | ICD-10-CM

## 2023-04-16 DIAGNOSIS — I10 Essential (primary) hypertension: Secondary | ICD-10-CM

## 2023-04-16 DIAGNOSIS — G8929 Other chronic pain: Secondary | ICD-10-CM | POA: Diagnosis not present

## 2023-04-16 MED ORDER — KETOROLAC TROMETHAMINE 60 MG/2ML IM SOLN
60.0000 mg | Freq: Once | INTRAMUSCULAR | Status: AC
  Administered 2023-04-16: 60 mg via INTRAMUSCULAR

## 2023-04-19 ENCOUNTER — Encounter: Payer: Self-pay | Admitting: Internal Medicine

## 2023-04-19 ENCOUNTER — Ambulatory Visit (INDEPENDENT_AMBULATORY_CARE_PROVIDER_SITE_OTHER): Payer: Medicaid Other | Admitting: Internal Medicine

## 2023-04-19 VITALS — BP 121/86 | HR 87 | Temp 98.1°F | Ht 59.0 in | Wt 141.6 lb

## 2023-04-19 DIAGNOSIS — N760 Acute vaginitis: Secondary | ICD-10-CM

## 2023-04-19 DIAGNOSIS — K5903 Drug induced constipation: Secondary | ICD-10-CM | POA: Insufficient documentation

## 2023-04-19 DIAGNOSIS — I10 Essential (primary) hypertension: Secondary | ICD-10-CM | POA: Diagnosis not present

## 2023-04-19 DIAGNOSIS — K219 Gastro-esophageal reflux disease without esophagitis: Secondary | ICD-10-CM | POA: Diagnosis not present

## 2023-04-19 DIAGNOSIS — N3001 Acute cystitis with hematuria: Secondary | ICD-10-CM | POA: Diagnosis not present

## 2023-04-19 DIAGNOSIS — E538 Deficiency of other specified B group vitamins: Secondary | ICD-10-CM

## 2023-04-19 DIAGNOSIS — R000 Tachycardia, unspecified: Secondary | ICD-10-CM | POA: Diagnosis not present

## 2023-04-19 DIAGNOSIS — B9689 Other specified bacterial agents as the cause of diseases classified elsewhere: Secondary | ICD-10-CM

## 2023-04-19 DIAGNOSIS — F11288 Opioid dependence with other opioid-induced disorder: Secondary | ICD-10-CM | POA: Diagnosis not present

## 2023-04-19 DIAGNOSIS — D72829 Elevated white blood cell count, unspecified: Secondary | ICD-10-CM | POA: Diagnosis not present

## 2023-04-19 DIAGNOSIS — G43509 Persistent migraine aura without cerebral infarction, not intractable, without status migrainosus: Secondary | ICD-10-CM | POA: Diagnosis not present

## 2023-04-19 LAB — POCT URINALYSIS DIPSTICK
Bilirubin, UA: NEGATIVE
Blood, UA: POSITIVE
Glucose, UA: NEGATIVE
Ketones, UA: NEGATIVE
Nitrite, UA: NEGATIVE
Protein, UA: POSITIVE — AB
Spec Grav, UA: >=1.03 — AB (ref 1.010–1.025)
Urobilinogen, UA: 0.2 U/dL
pH, UA: 5 (ref 5.0–8.0)

## 2023-04-19 MED ORDER — METRONIDAZOLE 500 MG PO TABS: 500.0000 mg | ORAL_TABLET | Freq: Two times a day (BID) | ORAL | 0 refills | Status: AC

## 2023-04-19 MED ORDER — LINACLOTIDE 145 MCG PO CAPS: 145.0000 ug | ORAL_CAPSULE | Freq: Every day | ORAL | 3 refills | Status: DC

## 2023-04-19 MED ORDER — CEPHALEXIN 500 MG PO CAPS: 500.0000 mg | ORAL_CAPSULE | Freq: Three times a day (TID) | ORAL | 0 refills | Status: DC

## 2023-04-19 MED ORDER — PROBIOTIC DAILY PO CAPS: 1.0000 | ORAL_CAPSULE | Freq: Every day | ORAL | 2 refills | Status: DC

## 2023-04-19 MED ORDER — BUPRENORPHINE HCL-NALOXONE HCL 8-2 MG SL FILM: 2.0000 | ORAL_FILM | Freq: Every day | SUBLINGUAL | 0 refills | Status: DC

## 2023-04-19 NOTE — Assessment & Plan Note (Addendum)
Chronic back pain has not been adequately controlled with the current Suboxone dose. We will increase Suboxone to 16 mg daily and prescribe 60 films for one month. She also doing intermittent Toradol.PDMP reviewed during this encounter.

## 2023-04-19 NOTE — Patient Instructions (Addendum)
VISIT SUMMARY:  During today's visit, we discussed your ongoing chronic back pain, recent urinary tract infection (UTI), constipation, and concerns about bacterial vaginosis (BV). We reviewed your current medications and made adjustments to better manage your symptoms. We also discussed your upcoming urology appointment and the need for further evaluation of your recurrent UTIs.  YOUR PLAN:  -CHRONIC BACK PAIN: Chronic back pain is persistent pain in the back that can be difficult to manage. We have increased your Suboxone dose to 16 mg daily and prescribed 60 films for one month. Please ensure you sign a pain management contract.  -URINARY TRACT INFECTION (UTI): A UTI is an infection in any part of your urinary system. We have prescribed cephalexin for 10 days and advised taking probiotics to prevent C. diff. A urine culture has been ordered, and you are referred to urology for a cystoscopy to rule out bladder cancer.  -BACTERIAL VAGINOSIS (BV): BV is a bacterial infection in the vagina that can cause a change in odor. We have prescribed metronidazole (Flagyl) to treat this condition.  -CONSTIPATION: Constipation is difficulty in passing stools, often caused by medications like Suboxone. We recommend daily stool softeners, fiber supplements, and stimulant laxatives if needed. We will prescribe Linzess if it is covered by your insurance.  -GASTROESOPHAGEAL REFLUX DISEASE (GERD): GERD is a condition where stomach acid frequently flows back into the tube connecting your mouth and stomach. We will continue Protonix and advise avoiding Toradol unless absolutely necessary.  INSTRUCTIONS:  Please follow up in 30 days and ensure you attend your urology appointment on November 11.

## 2023-04-19 NOTE — Addendum Note (Signed)
Addended by: Nash Shearer D on: 04/19/2023 03:51 PM   Modules accepted: Orders

## 2023-04-19 NOTE — Assessment & Plan Note (Signed)
She request to be checked for this and folate

## 2023-04-19 NOTE — Progress Notes (Signed)
[facility]: [phone]   -- Medical Office Visit --  Patient:  Laurie Roman      Age: 47 y.o.       Sex:  female  Date:   04/19/2023 Today's Healthcare Provider: [name], MD  ==========================================================================     Assessment & Plan Acute cystitis with hematuria The presence of hematuria, proteinuria, and pyuria alongside pain in the kidney area and hips may indicate a complicated UTI or kidney stone. The bladder sling could be contributing to recurrent UTIs, and a smoking history raises concern for bladder cancer, necessitating a cystoscopy. We will order a urine culture, prescribe cephalexin for 10 days, advise taking probiotics to prevent C. diff, and refer her to urology for a cystoscopy to rule out bladder cancer.  Opioid dependence with other opioid-induced disorder (HCC) Chronic back pain has not been adequately controlled with the current Suboxone dose. We will increase Suboxone to 16 mg daily and prescribe 60 films for one month. She also doing intermittent Toradol.PDMP reviewed during this encounter.  Drug-induced constipation Constipation is likely secondary to Suboxone use. She has intermittently used stool softeners. We discussed the need for daily stool softeners, fiber supplements, and the potential use of stimulant laxatives. We will prescribe Linzess if covered by insurance, recommend daily stool softeners and fiber supplements, and advise stimulant laxatives like Exlax, Senna, or Dulcolax if needed.  Tachycardia  B12 deficiency She request to be checked for this and folate BV (bacterial vaginosis) She exhibits symptoms consistent with BV, including an odor change, and has previously used metronidazole. We discussed gastrointestinal upset as a side effect and will prescribe metronidazole (Flagyl).  Gastroesophageal reflux disease without esophagitis Heartburn is likely exacerbated by  intermittent Toradol use. We discussed the ulcer risk from Toradol and the importance of Protonix. We will continue Protonix and advise avoiding Toradol unless necessary.  Leukocytosis, unspecified type  Essential hypertension  Diagnoses and all orders for this visit: Opioid dependence with other opioid-induced disorder (HCC) -     Buprenorphine HCl-Naloxone HCl (SUBOXONE) 8-2 MG FILM; Place 2 Film under the tongue daily at 6 (six) AM. Acute cystitis with hematuria -     Urine Culture -     POCT Urinalysis Dipstick -     cephALEXin (KEFLEX) 500 MG capsule; Take 1 capsule (500 mg total) by mouth 3 (three) times daily. -     Probiotic Product (PROBIOTIC DAILY) CAPS; Take 1 capsule by mouth daily. -     Urinalysis, Routine w reflex microscopic; Future Drug-induced constipation -     linaclotide (LINZESS) 145 MCG CAPS capsule; Take 1 capsule (145 mcg total) by mouth daily before breakfast. Tachycardia B12 deficiency -     Comp Met (CMET) -     CBC w/Diff -     B12 and Folate Panel; Future BV (bacterial vaginosis) -     metroNIDAZOLE (FLAGYL) 500 MG tablet; Take 1 tablet (500 mg total) by mouth 2 (two) times daily for 7 days. Gastroesophageal reflux disease without esophagitis Leukocytosis, unspecified type -     Iron, TIBC and Ferritin Panel -     Comp Met (CMET) -     CBC w/Diff Essential hypertension -     Comp Met (CMET) -     CBC w/Diff  Recommended follow-up: No follow-ups on file. Future Appointments  Date Time Provider Department Center  05/03/2023  3:00 PM Jake Bathe, MD [facility]  05/10/2023 11:20 AM [name], MD [facility]  Patient Care Team: Jeani Sow, MD as PCP - General (Family Medicine) Shaune Leeks as Social Worker    SUBJECTIVE: 47 y.o. female who has Migraine; ADHD; Essential hypertension; Mild obstructive sleep apnea; Controlled substance agreement signed; Gastroesophageal reflux disease without esophagitis; Left  sided sciatica; Asthma; Maxillary sinusitis; Anxiety; Primary insomnia; Depression, major, single episode, severe (HCC); Cervicalgia; Chronic dental pain; Opioid dependence (HCC); B12 deficiency; Urinary incontinence; Flank pain; Uric acid crystalluria; Right sided sciatica; Intertrigo; Chronic bilateral low back pain with sciatica; Chronic right hip pain; Tachycardia; and Drug-induced constipation on their problem list.. Main reasons for visit/main concerns/chief complaint: No chief complaint on file.   ------------------------------------------------------------------------------------------------------------------------ AI-Extracted: Discussed the use of AI scribe software for clinical note transcription with the patient, who gave verbal consent to proceed.  History of Present Illness   The patient, with a history of chronic back pain managed with Suboxone, presented with concerns about her medication regimen and a recent urinary tract infection (UTI). She reported that she had been taking up to two Suboxone films per day for pain control, but was unsure about the remaining supply and the appropriate timing for her next appointment. She also expressed concerns about a recent UTI, noting the presence of blood, protein, and white blood cells in her urine.  The patient's back pain, despite an increased dose of Suboxone, remained uncontrolled, leading to requests for Toradol. She reported that her pain was not localized to the kidney area, but rather extended to her hips, which had been causing significant discomfort in recent days.  In addition to her back pain and UTI, the patient also reported issues with constipation, a common side effect of Suboxone. She had been taking stool softeners to manage this, but was interested in exploring other options, such as Linzess.  The patient also mentioned a history of a bladder sling procedure, which she believed might be contributing to her recurrent UTIs. She  reported that she had an upcoming appointment with a urologist to investigate this further.  Lastly, the patient reported concerns about a change in her vaginal odor, suspecting bacterial vaginosis (BV). She requested a prescription for metronidazole (Flagyl) to manage this.       Note that patient  has a past medical history of ADD (attention deficit disorder), Anxiety, Asthma, Blood transfusion without reported diagnosis, Depression, GERD (gastroesophageal reflux disease), IBS (irritable bowel syndrome), Insomnia, Lumbago, Migraine, Opioid dependence (HCC) (08/21/2022), Other chest pain (08/17/2022), Sciatica, Sleep apnea, Tietze's disease, and Urinary incontinence (10/26/2022).  Problem list overviews that were updated at today's visit: Problem  Drug-Induced Constipation  B12 Deficiency  Opioid Dependence (Hcc)   Explained 08/21/22 that 4 months hydrocodone for pain management for delays in dental care has resulted in hyperalgesia and dependence Her dependence is iatrogenic (caused by medical need for pain management for prolonged dental pain with unavoidable delays in dental care)   Gastroesophageal Reflux Disease Without Esophagitis    Med reconciliation: Current Outpatient Medications on File Prior to Visit  Medication Sig   albuterol (VENTOLIN HFA) 108 (90 Base) MCG/ACT inhaler Inhale 2 puffs into the lungs every 6 (six) hours as needed for wheezing or shortness of breath.   amLODipine (NORVASC) 10 MG tablet Take 1 tablet (10 mg total) by mouth daily. Replaces amlodipine 5, for uncontrolled blood pressure.   amphetamine-dextroamphetamine (ADDERALL) 20 MG tablet Take 1 tablet (20 mg total) by mouth 2 (two) times daily.   ARIPiprazole (ABILIFY) 2 MG tablet Take 1 tablet (2 mg total) by mouth  daily as needed.   budesonide-formoterol (SYMBICORT) 160-4.5 MCG/ACT inhaler Inhale 2 puffs into the lungs 2 (two) times daily.   chlorhexidine (PERIDEX) 0.12 % solution Use as directed 15 mLs in  the mouth or throat 2 (two) times daily.   doxepin (SINEQUAN) 10 MG capsule Take 1 capsule (10 mg total) by mouth at bedtime.   Erenumab-aooe (AIMOVIG) 140 MG/ML SOAJ Inject 140 mg into the skin every 30 (thirty) days.   escitalopram (LEXAPRO) 20 MG tablet Take 1 tablet (20 mg total) by mouth daily.   fluconazole (DIFLUCAN) 150 MG tablet Take 1 tablet (150 mg total) by mouth every three (3) days as needed.   fluticasone (FLONASE) 50 MCG/ACT nasal spray Place 1 spray into both nostrils daily.   gabapentin (NEURONTIN) 800 MG tablet Take 1 tablet (800 mg total) by mouth 3 (three) times daily. Replaces prior dosing schedule.   ketorolac (TORADOL) 10 MG tablet Take 1 tablet (10 mg total) by mouth every 6 (six) hours as needed.   losartan (COZAAR) 100 MG tablet Take 1 tablet (100 mg total) by mouth daily.   magic mouthwash (nystatin, lidocaine, diphenhydrAMINE, alum & mag hydroxide) suspension Swish and swallow 5 mLs 4 (four) times daily.   metoprolol tartrate (LOPRESSOR) 25 MG tablet Take 1 tablet (25 mg total) by mouth 2 (two) times daily.   nystatin (MYCOSTATIN) 100000 UNIT/ML suspension Take 5 mLs (500,000 Units total) by mouth 4 (four) times daily. Swish and hold in mouth few seconds and swallow   nystatin (MYCOSTATIN/NYSTOP) powder Apply 1 Application topically 3 (three) times daily.   nystatin cream (MYCOSTATIN) Apply 1 Application topically 2 (two) times daily.   ondansetron (ZOFRAN) 4 MG tablet Take 1 tablet (4 mg total) by mouth every 8 (eight) hours as needed for nausea or vomiting.   pantoprazole (PROTONIX) 40 MG tablet Take 1 tablet (40 mg total) by mouth daily.   potassium chloride (KLOR-CON M) 20 MEQ tablet Take 1 tablet (20 mEq total) by mouth daily.   rizatriptan (MAXALT) 10 MG tablet Take 1 tablet (10 mg total) by mouth as needed for migraine. May repeat in 2 hours if needed   tiZANidine (ZANAFLEX) 4 MG tablet Take 1 tablet (4 mg total) by mouth every 6 (six) hours as needed for muscle  spasms.   Current Facility-Administered Medications on File Prior to Visit  Medication   ondansetron (ZOFRAN-ODT) disintegrating tablet 4 mg   Medications Discontinued During This Encounter  Medication Reason   Buprenorphine HCl-Naloxone HCl (SUBOXONE) 8-2 MG FILM Dose change     Objective   Physical Exam     04/19/2023    1:05 PM 04/02/2023    1:16 PM 03/26/2023    1:05 PM  Vitals with BMI  Height 4\' 11"  4\' 11"  4\' 11"   Weight 141 lbs 10 oz 141 lbs 3 oz 141 lbs 10 oz  BMI 28.58 28.5 28.58  Systolic 121 123 454  Diastolic 86 82 85  Pulse 87 94 95   Wt Readings from Last 10 Encounters:  04/19/23 141 lb 9.6 oz (64.2 kg)  04/02/23 141 lb 3.2 oz (64 kg)  03/26/23 141 lb 9.6 oz (64.2 kg)  03/24/23 145 lb (65.8 kg)  03/10/23 143 lb (64.9 kg)  02/26/23 147 lb (66.7 kg)  02/22/23 144 lb (65.3 kg)  02/16/23 144 lb 9.6 oz (65.6 kg)  02/16/23 144 lb (65.3 kg)  02/11/23 144 lb 12.8 oz (65.7 kg)   Vital signs reviewed.  Nursing notes reviewed. Weight trend  reviewed. Abnormalities and Problem-Specific physical exam findings:  appears well ,no CVA tenderness but some lower back tenderness is present. General Appearance:  No acute distress appreciable.   Well-groomed, healthy-appearing female.  Well proportioned with no abnormal fat distribution.  Good muscle tone. Pulmonary:  Normal work of breathing at rest, no respiratory distress apparent. SpO2: 99 %  Musculoskeletal: All extremities are intact.  Neurological:  Awake, alert, oriented, and engaged.  No obvious focal neurological deficits or cognitive impairments.  Sensorium seems unclouded.   Speech is clear and coherent with logical content. Psychiatric:  Appropriate mood, pleasant and cooperative demeanor, thoughtful and engaged during the exam  Results   LABS Urinalysis: Hematuria, proteinuria, pyuria (04/13/2023)        Results for orders placed or performed in visit on 04/19/23  POCT Urinalysis Dipstick  Result Value Ref  Range   Color, UA yellow    Clarity, UA cloudy    Glucose, UA Negative Negative   Bilirubin, UA Negative    Ketones, UA Negative    Spec Grav, UA >=1.030 (A) 1.010 - 1.025   Blood, UA Positive    pH, UA 5.0 5.0 - 8.0   Protein, UA Positive (A) Negative   Urobilinogen, UA 0.2 0.2 or 1.0 E.U./dL   Nitrite, UA Negative    Leukocytes, UA Moderate (2+) (A) Negative   Appearance     Odor      Office Visit on 04/19/2023  Component Date Value   Color, UA 04/19/2023 yellow    Clarity, UA 04/19/2023 cloudy    Glucose, UA 04/19/2023 Negative    Bilirubin, UA 04/19/2023 Negative    Ketones, UA 04/19/2023 Negative    Spec Grav, UA 04/19/2023 >=1.030 (A)    Blood, UA 04/19/2023 Positive    pH, UA 04/19/2023 5.0    Protein, UA 04/19/2023 Positive (A)    Urobilinogen, UA 04/19/2023 0.2    Nitrite, UA 04/19/2023 Negative    Leukocytes, UA 04/19/2023 Moderate (2+) (A)   Orders Only on 03/31/2023  Component Date Value   Glucose, Bld 03/31/2023 97    BUN 03/31/2023 14    Creat 03/31/2023 0.64    BUN/Creatinine Ratio 03/31/2023 SEE NOTE:    Sodium 03/31/2023 140    Potassium 03/31/2023 3.7    Chloride 03/31/2023 99    CO2 03/31/2023 26    Calcium 03/31/2023 10.2    Total Protein 03/31/2023 8.3 (H)    Albumin 03/31/2023 4.8    Globulin 03/31/2023 3.5    AG Ratio 03/31/2023 1.4    Total Bilirubin 03/31/2023 0.3    Alkaline phosphatase (AP* 03/31/2023 74    AST 03/31/2023 17    ALT 03/31/2023 9    Vitamin B-12 03/31/2023 865    WBC 03/31/2023 13.7 (H)    RBC 03/31/2023 3.94    Hemoglobin 03/31/2023 12.1    HCT 03/31/2023 36.9    MCV 03/31/2023 93.7    MCH 03/31/2023 30.7    MCHC 03/31/2023 32.8    RDW 03/31/2023 12.3    Platelets 03/31/2023 381    MPV 03/31/2023 11.2    Neutro Abs 03/31/2023 9,302 (H)    Absolute Lymphocytes 03/31/2023 3,192    Absolute Monocytes 03/31/2023 877    Eosinophils Absolute 03/31/2023 247    Basophils Absolute 03/31/2023 82    Neutrophils Relative  % 03/31/2023 67.9    Total Lymphocyte 03/31/2023 23.3    Monocytes Relative 03/31/2023 6.4    Eosinophils Relative 03/31/2023 1.8    Basophils Relative 03/31/2023  0.6   Office Visit on 03/26/2023  Component Date Value   Path Review 03/31/2023 8   Clinical Support on 03/03/2023  Component Date Value   WBC 03/05/2023 12.1 (H)    RBC 03/05/2023 3.97    Hemoglobin 03/05/2023 12.2    HCT 03/05/2023 36.2    MCV 03/05/2023 91.2    MCH 03/05/2023 30.7    MCHC 03/05/2023 33.7    RDW 03/05/2023 12.8    Platelets 03/05/2023 399    MPV 03/05/2023 11.2    Neutro Abs 03/05/2023 8,797 (H)    Lymphs Abs 03/05/2023 2,190    Absolute Monocytes 03/05/2023 883    Eosinophils Absolute 03/05/2023 145    Basophils Absolute 03/05/2023 85    Neutrophils Relative % 03/05/2023 72.7    Total Lymphocyte 03/05/2023 18.1    Monocytes Relative 03/05/2023 7.3    Eosinophils Relative 03/05/2023 1.2    Basophils Relative 03/05/2023 0.7   Office Visit on 02/26/2023  Component Date Value   Color, UA 02/26/2023 YELLOW    Clarity, UA 02/26/2023 CLOUDY    Glucose, UA 02/26/2023 Negative    Bilirubin, UA 02/26/2023 NEG    Ketones, UA 02/26/2023 NEG    Spec Grav, UA 02/26/2023 1.020    Blood, UA 02/26/2023 NEG    pH, UA 02/26/2023 6.0    Protein, UA 02/26/2023 Negative    Urobilinogen, UA 02/26/2023 0.2    Nitrite, UA 02/26/2023 POS    Leukocytes, UA 02/26/2023 Negative   Office Visit on 02/22/2023  Component Date Value   D-Dimer, Quant 02/22/2023 0.29    Troponin I 02/22/2023 <3    Color, Urine 02/22/2023 YELLOW    APPearance 02/22/2023 CLEAR    Specific Gravity, Urine 02/22/2023 1.015    pH 02/22/2023 6.0    Total Protein, Urine 02/22/2023 NEGATIVE    Urine Glucose 02/22/2023 NEGATIVE    Ketones, ur 02/22/2023 NEGATIVE    Bilirubin Urine 02/22/2023 NEGATIVE    Hgb urine dipstick 02/22/2023 NEGATIVE    Urobilinogen, UA 02/22/2023 0.2    Leukocytes,Ua 02/22/2023 NEGATIVE    Nitrite 02/22/2023  NEGATIVE    WBC, UA 02/22/2023 0-2/hpf    RBC / HPF 02/22/2023 0-2/hpf    Squamous Epithelial / HPF 02/22/2023 Few(5-10/hpf) (A)    Bacteria, UA 02/22/2023 Rare(<10/hpf) (A)    WBC 02/22/2023 13.3 (H)    RBC 02/22/2023 4.18    Hemoglobin 02/22/2023 12.5    HCT 02/22/2023 38.9    MCV 02/22/2023 93.1    MCHC 02/22/2023 32.1    RDW 02/22/2023 13.5    Platelets 02/22/2023 374.0    Neutrophils Relative % 02/22/2023 68.3    Lymphocytes Relative 02/22/2023 22.4    Monocytes Relative 02/22/2023 8.3    Eosinophils Relative 02/22/2023 0.5    Basophils Relative 02/22/2023 0.5    Neutro Abs 02/22/2023 9.1 (H)    Lymphs Abs 02/22/2023 3.0    Monocytes Absolute 02/22/2023 1.1 (H)    Eosinophils Absolute 02/22/2023 0.1    Basophils Absolute 02/22/2023 0.1    Sodium 02/22/2023 139    Potassium 02/22/2023 3.9    Chloride 02/22/2023 101    CO2 02/22/2023 28    Glucose, Bld 02/22/2023 84    BUN 02/22/2023 11    Creatinine, Ser 02/22/2023 0.55    Total Bilirubin 02/22/2023 0.2    Alkaline Phosphatase 02/22/2023 72    AST 02/22/2023 19    ALT 02/22/2023 16    Total Protein 02/22/2023 7.9    Albumin 02/22/2023 4.8  GFR 02/22/2023 109.19    Calcium 02/22/2023 9.9    Color, UA 02/22/2023 yellow    Clarity, UA 02/22/2023 cloudy    Glucose, UA 02/22/2023 Negative    Bilirubin, UA 02/22/2023 Negative    Ketones, UA 02/22/2023 Negative    Spec Grav, UA 02/22/2023 1.020    Blood, UA 02/22/2023 Negative    pH, UA 02/22/2023 6.0    Protein, UA 02/22/2023 Negative    Urobilinogen, UA 02/22/2023 0.2    Nitrite, UA 02/22/2023 Negative    Leukocytes, UA 02/22/2023 Negative    High Sens Troponin I 02/22/2023 4    POC Glucose 02/22/2023 107 (A)   Admission on 02/16/2023, Discharged on 02/16/2023  Component Date Value   WBC 02/16/2023 13.6 (H)    RBC 02/16/2023 3.55 (L)    Hemoglobin 02/16/2023 11.0 (L)    HCT 02/16/2023 33.0 (L)    MCV 02/16/2023 93.0    MCH 02/16/2023 31.0    MCHC  02/16/2023 33.3    RDW 02/16/2023 12.5    Platelets 02/16/2023 250    nRBC 02/16/2023 0.0    Color, Urine 02/16/2023 YELLOW (A)    APPearance 02/16/2023 CLOUDY (A)    Specific Gravity, Urine 02/16/2023 1.016    pH 02/16/2023 5.0    Glucose, UA 02/16/2023 NEGATIVE    Hgb urine dipstick 02/16/2023 NEGATIVE    Bilirubin Urine 02/16/2023 NEGATIVE    Ketones, ur 02/16/2023 NEGATIVE    Protein, ur 02/16/2023 NEGATIVE    Nitrite 02/16/2023 NEGATIVE    Leukocytes,Ua 02/16/2023 TRACE (A)    RBC / HPF 02/16/2023 0-5    WBC, UA 02/16/2023 6-10    Bacteria, UA 02/16/2023 RARE (A)    Squamous Epithelial / HPF 02/16/2023 21-50    Mucus 02/16/2023 PRESENT    Troponin I (High Sensiti* 02/16/2023 <2    Sodium 02/16/2023 137    Potassium 02/16/2023 3.4 (L)    Chloride 02/16/2023 102    CO2 02/16/2023 24    Glucose, Bld 02/16/2023 132 (H)    BUN 02/16/2023 15    Creatinine, Ser 02/16/2023 0.54    Calcium 02/16/2023 8.8 (L)    Total Protein 02/16/2023 6.9    Albumin 02/16/2023 4.1    AST 02/16/2023 23    ALT 02/16/2023 15    Alkaline Phosphatase 02/16/2023 54    Total Bilirubin 02/16/2023 0.6    GFR, Estimated 02/16/2023 >60    Anion gap 02/16/2023 11    Troponin I (High Sensiti* 02/16/2023 3   Office Visit on 02/11/2023  Component Date Value   Color, UA 02/11/2023 Yellow    Clarity, UA 02/11/2023 clear    Glucose, UA 02/11/2023 Negative    Bilirubin, UA 02/11/2023 negative    Ketones, UA 02/11/2023 15    Spec Grav, UA 02/11/2023 1.025    Blood, UA 02/11/2023 negative    pH, UA 02/11/2023 5.0    Protein, UA 02/11/2023 Negative    Urobilinogen, UA 02/11/2023 0.2    Nitrite, UA 02/11/2023 negative    Leukocytes, UA 02/11/2023 Trace (A)    MICRO NUMBER: 02/11/2023 [id number]    SPECIMEN QUALITY: 02/11/2023 Adequate    Sample Source 02/11/2023 URINE    STATUS: 02/11/2023 FINAL    Result: 02/11/2023                     Value:Mixed genital flora isolated. These superficial bacteria are  not indicative of a urinary tract infection. No further organism identification is warranted on this specimen. If  clinically indicated, recollect clean-catch, mid-stream urine and transfer  immediately to Urine Culture Transport Tube.    WBC, UA 02/11/2023 0-2/hpf    RBC / HPF 02/11/2023 0-2/hpf    Mucus, UA 02/11/2023 Presence of (A)    Squamous Epithelial / HPF 02/11/2023 Few(5-10/hpf) (A)    Hyaline Casts, UA 02/11/2023 Presence of (A)   Office Visit on 01/25/2023  Component Date Value   WBC 01/25/2023 11.8 (H)    RBC 01/25/2023 4.02    Hemoglobin 01/25/2023 12.5    HCT 01/25/2023 37.6    MCV 01/25/2023 93.4    MCHC 01/25/2023 33.2    RDW 01/25/2023 13.8    Platelets 01/25/2023 375.0    Neutrophils Relative % 01/25/2023 64.2    Lymphocytes Relative 01/25/2023 27.2    Monocytes Relative 01/25/2023 6.8    Eosinophils Relative 01/25/2023 1.1    Basophils Relative 01/25/2023 0.7    Neutro Abs 01/25/2023 7.6    Lymphs Abs 01/25/2023 3.2    Monocytes Absolute 01/25/2023 0.8    Eosinophils Absolute 01/25/2023 0.1    Basophils Absolute 01/25/2023 0.1    Sodium 01/25/2023 140    Potassium 01/25/2023 3.4 (L)    Chloride 01/25/2023 102    CO2 01/25/2023 24    Glucose, Bld 01/25/2023 153 (H)    BUN 01/25/2023 14    Creatinine, Ser 01/25/2023 0.62    Total Bilirubin 01/25/2023 0.2    Alkaline Phosphatase 01/25/2023 66    AST 01/25/2023 20    ALT 01/25/2023 18    Total Protein 01/25/2023 7.9    Albumin 01/25/2023 4.9    GFR 01/25/2023 106.14    Calcium 01/25/2023 9.8    Hgb A1c MFr Bld 01/25/2023 5.8    Vitamin B-12 01/25/2023 411    HAV 1 IGG,TYPE SPECIFIC * 01/25/2023 27.60 (H)    HSV 2 IGG,TYPE SPECIFIC * 01/25/2023 <0.90    RPR Ser Ql 01/25/2023 NON-REACTIVE    Hepatitis B Surface Ag 01/25/2023 Negative    Hep B E Ag 01/25/2023 Negative    Hep B C IgM 01/25/2023 Negative    Hep B Core Total Ab 01/25/2023 Negative    Hep B E Ab 01/25/2023 Non Reactive    Hep B Surface Ab,  Qual 01/25/2023 Non Reactive    Hep C Virus Ab 01/25/2023 Non Reactive    HIV Screen 4th Generatio* 01/25/2023 Non Reactive    Neisseria Gonorrhea 01/25/2023 Negative    Chlamydia 01/25/2023 Negative    Trichomonas 01/25/2023 Negative    Bacterial Vaginitis (gar* 01/25/2023 Negative    Candida Vaginitis 01/25/2023 Positive (A)    Candida Glabrata 01/25/2023 Negative    Comment 01/25/2023 Normal Reference Range Candida Species - Negative    Comment 01/25/2023 Normal Reference Range Candida Galbrata - Negative    Comment 01/25/2023 Normal Reference Range Trichomonas - Negative    Comment 01/25/2023 Normal Reference Ranger Chlamydia - Negative    Comment 01/25/2023 Normal Reference Range Neisseria Gonorrhea - Negative    Comment 01/25/2023 Normal Reference Range Bacterial Vaginosis - Negative    Cholesterol 01/25/2023 255 (H)    Triglycerides 01/25/2023 258.0 (H)    HDL 01/25/2023 81.50    VLDL 01/25/2023 51.6 (H)    Total CHOL/HDL Ratio 01/25/2023 3    NonHDL 01/25/2023 173.08    TSH 01/25/2023 0.82    Direct LDL 01/25/2023 160.0   Office Visit on 12/29/2022  Component Date Value   Color, UA 12/29/2022 yellow    Clarity, UA 12/29/2022 clear  Glucose, UA 12/29/2022 Negative    Bilirubin, UA 12/29/2022 Negative    Ketones, UA 12/29/2022 Negative    Spec Grav, UA 12/29/2022 1.020    Blood, UA 12/29/2022 Negative    pH, UA 12/29/2022 6.0    Protein, UA 12/29/2022 Negative    Urobilinogen, UA 12/29/2022 0.2    Nitrite, UA 12/29/2022 Negative    Leukocytes, UA 12/29/2022 Negative   There may be more visits with results that are not included.   No image results found.   LONG TERM MONITOR (3-14 DAYS)  Result Date: 04/04/2023 NSR with sinus brady (51/min) and sinus tachy (150/min), ave 94/min. Rare PVC's and PAC's, both less than 1% burden. No VT, SVT or atrial fib No prolonged pauses Symptoms recorded coincide with PVC's and NSR. Patch Wear Time:  13 days and 12 hours  ([id number] to 2024-10-03T01:27:53-0400)  US Abdomen Limited RUQ (LIVER/GB)  Result Date: 03/03/2023 CLINICAL DATA:  Right upper quadrant abdominal pain for several days. EXAM: ULTRASOUND ABDOMEN LIMITED RIGHT UPPER QUADRANT COMPARISON:  September 02, 2017. FINDINGS: Gallbladder: No gallstones or wall thickening visualized. No sonographic Murphy sign noted by sonographer. Common bile duct: Diameter: 4 mm which is within normal limits. Liver: No focal lesion identified. Within normal limits in parenchymal echogenicity. Portal vein is patent on color Doppler imaging with normal direction of blood flow towards the liver. Other: None. Electronically Signed   By: Lupita Raider M.D.   On: 03/03/2023 15:23   DG Chest 2 View  Result Date: 03/01/2023 CLINICAL DATA:  Left-sided chest pain and shortness of breath for 2 weeks EXAM: CHEST - 2 VIEW COMPARISON:  06/05/2021 FINDINGS: Midline trachea.  Normal heart size and mediastinal contours. Sharp costophrenic angles.  No pneumothorax.  Clear lungs. Loop recorder projects over the left side of the chest. IMPRESSION: No active cardiopulmonary disease. Electronically Signed   By: Jeronimo Greaves M.D.   On: 03/01/2023 10:20    US Abdomen Limited RUQ (LIVER/GB)  Result Date: 03/03/2023 CLINICAL DATA:  Right upper quadrant abdominal pain for several days. EXAM: ULTRASOUND ABDOMEN LIMITED RIGHT UPPER QUADRANT COMPARISON:  September 02, 2017. FINDINGS: Gallbladder: No gallstones or wall thickening visualized. No sonographic Murphy sign noted by sonographer. Common bile duct: Diameter: 4 mm which is within normal limits. Liver: No focal lesion identified. Within normal limits in parenchymal echogenicity. Portal vein is patent on color Doppler imaging with normal direction of blood flow towards the liver. Other: None. Electronically Signed   By: Lupita Raider M.D.   On: 03/03/2023 15:23         Additional Info: This encounter employed real-time, collaborative  documentation. The patient actively reviewed and updated their medical record on a shared screen, ensuring transparency and facilitating joint problem-solving for the problem list, overview, and plan. This approach promotes accurate, informed care. The treatment plan was discussed and reviewed in detail, including medication safety, potential side effects, and all patient questions. We confirmed understanding and comfort with the plan. Follow-up instructions were established, including contacting the office for any concerns, returning if symptoms worsen, persist, or new symptoms develop, and precautions for potential emergency department visits.

## 2023-04-19 NOTE — Assessment & Plan Note (Signed)
Heartburn is likely exacerbated by intermittent Toradol use. We discussed the ulcer risk from Toradol and the importance of Protonix. We will continue Protonix and advise avoiding Toradol unless necessary.

## 2023-04-19 NOTE — Assessment & Plan Note (Signed)
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 She has intermittently used stool softeners. We discussed the need for daily stool softeners, fiber supplements, and the potential use of stimulant laxatives. We will prescribe Linzess if covered by insurance, recommend daily stool softeners and fiber supplements, and advise stimulant laxatives like Exlax, Senna, or Dulcolax if needed.

## 2023-04-20 ENCOUNTER — Encounter: Payer: Self-pay | Admitting: Internal Medicine

## 2023-04-20 LAB — URINALYSIS, ROUTINE W REFLEX MICROSCOPIC
Bilirubin Urine: NEGATIVE
Glucose, UA: NEGATIVE
Hgb urine dipstick: NEGATIVE
Ketones, ur: NEGATIVE
Nitrite: POSITIVE — AB
Specific Gravity, Urine: 1.025 (ref 1.001–1.035)
pH: <=5 (ref 5.0–8.0)

## 2023-04-20 LAB — CBC WITH DIFFERENTIAL/PLATELET
Absolute Lymphocytes: 2619 {cells}/uL (ref 850–3900)
Absolute Monocytes: 1109 {cells}/uL — ABNORMAL HIGH (ref 200–950)
Basophils Absolute: 65 {cells}/uL (ref 0–200)
Basophils Relative: 0.5 %
Eosinophils Absolute: 168 {cells}/uL (ref 15–500)
Eosinophils Relative: 1.3 %
HCT: 32.4 % — ABNORMAL LOW (ref 35.0–45.0)
Hemoglobin: 10.7 g/dL — ABNORMAL LOW (ref 11.7–15.5)
MCH: 30.5 pg (ref 27.0–33.0)
MCHC: 33 g/dL (ref 32.0–36.0)
MCV: 92.3 fL (ref 80.0–100.0)
MPV: 12.6 fL — ABNORMAL HIGH (ref 7.5–12.5)
Monocytes Relative: 8.6 %
Neutro Abs: 8940 {cells}/uL — ABNORMAL HIGH (ref 1500–7800)
Neutrophils Relative %: 69.3 %
Platelets: 290 10*3/uL (ref 140–400)
RBC: 3.51 10*6/uL — ABNORMAL LOW (ref 3.80–5.10)
RDW: 12.5 % (ref 11.0–15.0)
Total Lymphocyte: 20.3 %
WBC: 12.9 10*3/uL — ABNORMAL HIGH (ref 3.8–10.8)

## 2023-04-20 LAB — IRON,TIBC AND FERRITIN PANEL
%SAT: 7 % — ABNORMAL LOW (ref 16–45)
Ferritin: 25 ng/mL (ref 16–232)
Iron: 24 ug/dL — ABNORMAL LOW (ref 40–190)
TIBC: 326 ug/dL (ref 250–450)

## 2023-04-20 LAB — COMPREHENSIVE METABOLIC PANEL
AG Ratio: 1.7 (calc) (ref 1.0–2.5)
ALT: 12 U/L (ref 6–29)
AST: 16 U/L (ref 10–35)
Albumin: 4.4 g/dL (ref 3.6–5.1)
Alkaline phosphatase (APISO): 77 U/L (ref 31–125)
BUN: 20 mg/dL (ref 7–25)
CO2: 23 mmol/L (ref 20–32)
Calcium: 9.2 mg/dL (ref 8.6–10.2)
Chloride: 103 mmol/L (ref 98–110)
Creat: 0.64 mg/dL (ref 0.50–0.99)
Globulin: 2.6 g/dL (ref 1.9–3.7)
Glucose, Bld: 76 mg/dL (ref 65–99)
Potassium: 4.7 mmol/L (ref 3.5–5.3)
Sodium: 139 mmol/L (ref 135–146)
Total Bilirubin: 0.2 mg/dL (ref 0.2–1.2)
Total Protein: 7 g/dL (ref 6.1–8.1)

## 2023-04-20 LAB — URINE CULTURE
MICRO NUMBER:: 15712840
SPECIMEN QUALITY:: ADEQUATE

## 2023-04-20 LAB — B12 AND FOLATE PANEL
Folate: 5 ng/mL — ABNORMAL LOW
Vitamin B-12: 644 pg/mL (ref 200–1100)

## 2023-04-20 LAB — MICROSCOPIC MESSAGE

## 2023-04-20 NOTE — Progress Notes (Signed)
   Culture results: Mixed genital flora isolated. These superficial bacteria are not indicative of a urinary tract infection. No further organism identification is warranted on this specimen. If clinically indicated, recollect clean-catch, mid-stream urine and transfer immediately to Urine Culture Transport Tube.

## 2023-04-20 NOTE — Progress Notes (Signed)
Patient was given a toradol injection on 04/16/2023. Dr. Jon Billings verbally gave permission for the injection. Patient tolerated well. Donzetta Starch, CMA

## 2023-04-21 ENCOUNTER — Other Ambulatory Visit (HOSPITAL_BASED_OUTPATIENT_CLINIC_OR_DEPARTMENT_OTHER): Payer: Self-pay

## 2023-04-21 ENCOUNTER — Ambulatory Visit: Payer: Medicaid Other | Admitting: Internal Medicine

## 2023-04-21 ENCOUNTER — Encounter: Payer: Self-pay | Admitting: Internal Medicine

## 2023-04-21 VITALS — BP 129/90 | HR 95 | Temp 97.8°F | Ht 59.0 in | Wt 141.6 lb

## 2023-04-21 DIAGNOSIS — G2581 Restless legs syndrome: Secondary | ICD-10-CM

## 2023-04-21 DIAGNOSIS — F11288 Opioid dependence with other opioid-induced disorder: Secondary | ICD-10-CM | POA: Diagnosis not present

## 2023-04-21 DIAGNOSIS — E538 Deficiency of other specified B group vitamins: Secondary | ICD-10-CM

## 2023-04-21 DIAGNOSIS — E611 Iron deficiency: Secondary | ICD-10-CM

## 2023-04-21 MED ORDER — FERROUS GLUCONATE 324 (38 FE) MG PO TABS
324.0000 mg | ORAL_TABLET | Freq: Every day | ORAL | 0 refills | Status: DC
  Filled 2023-04-21 (×2): qty 100, 100d supply, fill #0

## 2023-04-21 MED ORDER — B-12 1000 MCG SL SUBL
1.0000 | SUBLINGUAL_TABLET | Freq: Every day | SUBLINGUAL | 3 refills | Status: DC
  Filled 2023-04-21: qty 90, fill #0

## 2023-04-21 MED ORDER — SUBOXONE 8-2 MG SL FILM
2.0000 | ORAL_FILM | Freq: Every day | SUBLINGUAL | 0 refills | Status: DC
  Filled 2023-04-21: qty 5, 2d supply, fill #0
  Filled 2023-04-21: qty 60, 30d supply, fill #0
  Filled 2023-04-21: qty 6, 3d supply, fill #0
  Filled 2023-04-26: qty 54, 27d supply, fill #1
  Filled 2023-04-26: qty 3, 2d supply, fill #1
  Filled 2023-04-26: qty 51, 25d supply, fill #1
  Filled 2023-04-26: qty 54, 27d supply, fill #1

## 2023-04-21 MED ORDER — ROPINIROLE HCL 0.25 MG PO TABS: 0.2500 mg | ORAL_TABLET | Freq: Three times a day (TID) | ORAL | 2 refills | Status: DC

## 2023-04-21 NOTE — Assessment & Plan Note (Signed)
Nutritional Deficiency   She does not take a multivitamin and has a poor appetite, leading to nutritional deficiencies. We encouraged the consumption of protein shakes if poor appetite continues. We recommend starting a multivitamin and encourage the consumption of protein shakes if poor appetite continues.

## 2023-04-21 NOTE — Progress Notes (Signed)
[facility]: [phone]   -- Medical Office Visit --  Patient:  Laurie Roman      Age: 47 y.o.       Sex:  female  Date:   04/21/2023 Today's Healthcare Provider: [name], MD  ==========================================================================     Assessment & Plan Restless legs syndrome Restless Leg Syndrome (RLS)   She exhibits recurrence of RLS symptoms, including leg pain, tingling, and involuntary movements, worsening at night since discontinuing pain medication, likely exacerbated by low iron levels. We suspect secondary RLS due to low iron or back issues and have discussed Requip as a temporary measure until iron levels are corrected, noting its sedative effects and extensive side effect profile. We will prescribe Requip (ropinirole) with a low starting dose and titrate slowly, monitor for side effects including sedation, and reassess the need for Requip once iron levels are corrected. Opioid dependence with other opioid-induced disorder (HCC) Suboxone Management   She is currently on Suboxone for opioid dependence but had to reduce the dose due to pharmacy issues, potentially contributing to RLS symptoms. We discussed correcting the prescription and sending it to a different pharmacy to avoid delays. We will correct the Suboxone prescription to ensure the appropriate dose and send the prescription to a different pharmacy if necessary to avoid delays. Iron deficiency Iron Deficiency   Recent lab work shows low iron levels, potentially contributing to RLS, with poor dietary iron intake noted. We discussed the risk of constipation with iron supplements and the potential need for IV iron if oral iron is not tolerated, encouraging dietary intake of iron-rich foods. We will prescribe ferrous gluconate, monitor for constipation and discontinue iron if severe constipation occurs, consider IV iron if oral iron is not tolerated, encourage dietary intake  of iron-rich foods, and recheck iron levels in 1-3 months. B12 deficiency Nutritional Deficiency   She does not take a multivitamin and has a poor appetite, leading to nutritional deficiencies. We encouraged the consumption of protein shakes if poor appetite continues. We recommend starting a multivitamin and encourage the consumption of protein shakes if poor appetite continues. Follow-up   We will schedule a follow-up appointment in 1 months to recheck iron levels and assess the effectiveness of Requip and iron supplementation. Diagnoses and all orders for this visit: Restless legs syndrome -     rOPINIRole (REQUIP) 0.25 MG tablet; Take 1 tablet (0.25 mg total) by mouth 3 (three) times daily. 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 Iron deficiency -     ferrous gluconate (FERGON) 324 MG tablet; Take 1 tablet (324 mg total) by mouth daily. Take with laxatives. B12 deficiency -     Cyanocobalamin (B-12) 1000 MCG SUBL; Place 1 tablet under the tongue daily at 6 (six) AM.  Recommended follow-up: No follow-ups on file. Future Appointments  Date Time Provider Department Center  05/03/2023  3:00 PM Jake Bathe, MD [facility]  05/10/2023 11:20 AM [name], MD [facility]  Patient Care Team: Jeani Sow, MD as PCP - General (Family Medicine) Shaune Leeks as Social Worker    SUBJECTIVE: 47 y.o. female who has Migraine; ADHD; Essential hypertension; Mild obstructive sleep apnea; Controlled substance agreement signed; Gastroesophageal reflux disease without esophagitis; Left sided sciatica; Asthma; Maxillary sinusitis; Anxiety; Primary insomnia; Depression, major, single episode, severe (HCC); Cervicalgia; Chronic dental pain;  Opioid dependence (HCC); B12 deficiency; Urinary incontinence; Flank pain; Uric acid crystalluria; Right  sided sciatica; Intertrigo; Chronic bilateral low back pain with sciatica; Chronic right hip pain; Tachycardia; and Drug-induced constipation on their problem list.. Main reasons for visit/main concerns/chief complaint: Restless legs   ------------------------------------------------------------------------------------------------------------------------ AI-Extracted: Discussed the use of AI scribe software for clinical note transcription with the patient, who gave verbal consent to proceed.  History of Present Illness   The patient, with a history of substance use disorder and depression, presents with complaints of restless leg syndrome (RLS) that has been worsening recently. The patient reports a previous diagnosis of RLS, but the symptoms have been more pronounced lately, particularly at night. The patient describes the symptoms as a sensation of her legs "jumping" and a constant need to move them. The patient also reports that these symptoms have been disrupting her sleep and causing leg soreness the following morning.  The patient has been on Suboxone for her substance use disorder and has recently had to reduce her dose due to prescription issues. The patient also reports a history of depression, which seems to be exacerbated during the holiday season due to personal losses. The patient has been on Lexapro for her depression.  The patient also reports a lack of appetite and significant weight loss over the past six months. Recent lab work has shown low iron levels, which the patient believes may be contributing to her RLS symptoms. However, the patient has concerns about potential constipation from the iron supplements, as she already has issues with bowel movements.  The patient has previously been prescribed Abilify for her depression but has been hesitant to take it due to concerns about side effects. The patient has expressed a willingness to consider a small dose of Abilify in the future.  The patient also reports that she has been taking over-the-counter leg cramp medication, which provides some relief for her RLS symptoms.       Note that patient  has a past medical history of ADD (attention deficit disorder), Anxiety, Asthma, Blood transfusion without reported diagnosis, Depression, GERD (gastroesophageal reflux disease), IBS (irritable bowel syndrome), Insomnia, Lumbago, Migraine, Opioid dependence (HCC) (08/21/2022), Other chest pain (08/17/2022), Sciatica, Sleep apnea, Tietze's disease, and Urinary incontinence (10/26/2022).  Problem list overviews that were updated at today's visit:No problems updated.  Med reconciliation: Current Outpatient Medications on File Prior to Visit  Medication Sig   albuterol (VENTOLIN HFA) 108 (90 Base) MCG/ACT inhaler Inhale 2 puffs into the lungs every 6 (six) hours as needed for wheezing or shortness of breath.   amLODipine (NORVASC) 10 MG tablet Take 1 tablet (10 mg total) by mouth daily. Replaces amlodipine 5, for uncontrolled blood pressure.   amphetamine-dextroamphetamine (ADDERALL) 20 MG tablet Take 1 tablet (20 mg total) by mouth 2 (two) times daily.   ARIPiprazole (ABILIFY) 2 MG tablet Take 1 tablet (2 mg total) by mouth daily as needed.   budesonide-formoterol (SYMBICORT) 160-4.5 MCG/ACT inhaler Inhale 2 puffs into the lungs 2 (two) times daily.   cephALEXin (KEFLEX) 500 MG capsule Take 1 capsule (500 mg total) by mouth 3 (three) times daily.   chlorhexidine (PERIDEX) 0.12 % solution Use as directed 15 mLs in the mouth or throat 2 (two) times daily.   doxepin (SINEQUAN) 10 MG capsule Take 1 capsule (10 mg total) by mouth at bedtime.   Erenumab-aooe (AIMOVIG) 140 MG/ML SOAJ Inject 140 mg into the skin every 30 (thirty) days.   escitalopram (LEXAPRO) 20 MG tablet Take 1 tablet (  20 mg total) by mouth daily.   fluconazole (DIFLUCAN) 150 MG tablet Take 1 tablet (150 mg total) by mouth every three (3) days as needed.   fluticasone (FLONASE)  50 MCG/ACT nasal spray Place 1 spray into both nostrils daily.   gabapentin (NEURONTIN) 800 MG tablet Take 1 tablet (800 mg total) by mouth 3 (three) times daily. Replaces prior dosing schedule.   ketorolac (TORADOL) 10 MG tablet Take 1 tablet (10 mg total) by mouth every 6 (six) hours as needed.   linaclotide (LINZESS) 145 MCG CAPS capsule Take 1 capsule (145 mcg total) by mouth daily before breakfast.   losartan (COZAAR) 100 MG tablet Take 1 tablet (100 mg total) by mouth daily.   magic mouthwash (nystatin, lidocaine, diphenhydrAMINE, alum & mag hydroxide) suspension Swish and swallow 5 mLs 4 (four) times daily.   metoprolol tartrate (LOPRESSOR) 25 MG tablet Take 1 tablet (25 mg total) by mouth 2 (two) times daily.   metroNIDAZOLE (FLAGYL) 500 MG tablet Take 1 tablet (500 mg total) by mouth 2 (two) times daily for 7 days.   nystatin (MYCOSTATIN) 100000 UNIT/ML suspension Take 5 mLs (500,000 Units total) by mouth 4 (four) times daily. Swish and hold in mouth few seconds and swallow   nystatin (MYCOSTATIN/NYSTOP) powder Apply 1 Application topically 3 (three) times daily.   nystatin cream (MYCOSTATIN) Apply 1 Application topically 2 (two) times daily.   ondansetron (ZOFRAN) 4 MG tablet Take 1 tablet (4 mg total) by mouth every 8 (eight) hours as needed for nausea or vomiting.   pantoprazole (PROTONIX) 40 MG tablet Take 1 tablet (40 mg total) by mouth daily.   potassium chloride (KLOR-CON M) 20 MEQ tablet Take 1 tablet (20 mEq total) by mouth daily.   Probiotic Product (PROBIOTIC DAILY) CAPS Take 1 capsule by mouth daily.   rizatriptan (MAXALT) 10 MG tablet Take 1 tablet (10 mg total) by mouth as needed for migraine. May repeat in 2 hours if needed   tiZANidine (ZANAFLEX) 4 MG tablet Take 1 tablet (4 mg total) by mouth every 6 (six) hours as needed for muscle spasms.   Current Facility-Administered Medications on File Prior to Visit  Medication   ondansetron (ZOFRAN-ODT) disintegrating tablet 4 mg    Medications Discontinued During This Encounter  Medication Reason   Buprenorphine HCl-Naloxone HCl (SUBOXONE) 8-2 MG FILM Reorder     Objective   Physical Exam     04/21/2023    4:53 PM 04/21/2023    4:26 PM 04/19/2023    1:05 PM  Vitals with BMI  Height  4\' 11"  4\' 11"   Weight  141 lbs 10 oz 141 lbs 10 oz  BMI  28.58 28.58  Systolic 129 136 272  Diastolic 90 96 86  Pulse  95 87   Wt Readings from Last 10 Encounters:  04/21/23 141 lb 9.6 oz (64.2 kg)  04/19/23 141 lb 9.6 oz (64.2 kg)  04/02/23 141 lb 3.2 oz (64 kg)  03/26/23 141 lb 9.6 oz (64.2 kg)  03/24/23 145 lb (65.8 kg)  03/10/23 143 lb (64.9 kg)  02/26/23 147 lb (66.7 kg)  02/22/23 144 lb (65.3 kg)  02/16/23 144 lb 9.6 oz (65.6 kg)  02/16/23 144 lb (65.3 kg)   Vital signs reviewed.  Nursing notes reviewed. Weight trend reviewed. Abnormalities and Problem-Specific physical exam findings:    General Appearance:  No acute distress appreciable.   Well-groomed, healthy-appearing female.  Well proportioned with no abnormal fat distribution.  Good muscle tone. Pulmonary:  Normal work of breathing at rest, no respiratory distress apparent. SpO2: 100 %  Musculoskeletal: All extremities are intact.  Neurological:  Awake, alert, oriented, and engaged.  No obvious focal neurological deficits or cognitive impairments.  Sensorium seems unclouded.   Speech is clear and coherent with logical content. Psychiatric:  Appropriate mood, pleasant and cooperative demeanor, thoughtful and engaged during the exam  Results   LABS Iron panel: low (04/16/2023)        No results found for any visits on 04/21/23.  Office Visit on 04/19/2023  Component Date Value   MICRO NUMBER: 04/19/2023 [id number]    SPECIMEN QUALITY: 04/19/2023 Adequate    Sample Source 04/19/2023 NOT GIVEN    STATUS: 04/19/2023 FINAL    Result: 04/19/2023                     Value:Mixed genital flora isolated. These superficial bacteria are not indicative of a  urinary tract infection. No further organism identification is warranted on this specimen. If clinically indicated, recollect clean-catch, mid-stream urine and transfer  immediately to Urine Culture Transport Tube.    Color, UA 04/19/2023 yellow    Clarity, UA 04/19/2023 cloudy    Glucose, UA 04/19/2023 Negative    Bilirubin, UA 04/19/2023 Negative    Ketones, UA 04/19/2023 Negative    Spec Grav, UA 04/19/2023 >=1.030 (A)    Blood, UA 04/19/2023 Positive    pH, UA 04/19/2023 5.0    Protein, UA 04/19/2023 Positive (A)    Urobilinogen, UA 04/19/2023 0.2    Nitrite, UA 04/19/2023 Negative    Leukocytes, UA 04/19/2023 Moderate (2+) (A)    Iron 04/19/2023 24 (L)    TIBC 04/19/2023 326    %SAT 04/19/2023 7 (L)    Ferritin 04/19/2023 25    Glucose, Bld 04/19/2023 76    BUN 04/19/2023 20    Creat 04/19/2023 0.64    BUN/Creatinine Ratio 04/19/2023 SEE NOTE:    Sodium 04/19/2023 139    Potassium 04/19/2023 4.7    Chloride 04/19/2023 103    CO2 04/19/2023 23    Calcium 04/19/2023 9.2    Total Protein 04/19/2023 7.0    Albumin 04/19/2023 4.4    Globulin 04/19/2023 2.6    AG Ratio 04/19/2023 1.7    Total Bilirubin 04/19/2023 0.2    Alkaline phosphatase (AP* 04/19/2023 77    AST 04/19/2023 16    ALT 04/19/2023 12    WBC 04/19/2023 12.9 (H)    RBC 04/19/2023 3.51 (L)    Hemoglobin 04/19/2023 10.7 (L)    HCT 04/19/2023 32.4 (L)    MCV 04/19/2023 92.3    MCH 04/19/2023 30.5    MCHC 04/19/2023 33.0    RDW 04/19/2023 12.5    Platelets 04/19/2023 290    MPV 04/19/2023 12.6 (H)    Neutro Abs 04/19/2023 8,940 (H)    Absolute Lymphocytes 04/19/2023 2,619    Absolute Monocytes 04/19/2023 1,109 (H)    Eosinophils Absolute 04/19/2023 168    Basophils Absolute 04/19/2023 65    Neutrophils Relative % 04/19/2023 69.3    Total Lymphocyte 04/19/2023 20.3    Monocytes Relative 04/19/2023 8.6    Eosinophils Relative 04/19/2023 1.3    Basophils Relative 04/19/2023 0.5    Smear Review 04/19/2023      Color, Urine 04/19/2023 YELLOW    APPearance 04/19/2023 CLOUDY (A)    Specific Gravity, Urine 04/19/2023 1.025    pH 04/19/2023 < OR = 5.0    Glucose, UA 04/19/2023 NEGATIVE  Bilirubin Urine 04/19/2023 NEGATIVE    Ketones, ur 04/19/2023 NEGATIVE    Hgb urine dipstick 04/19/2023 NEGATIVE    Protein, ur 04/19/2023 TRACE (A)    Nitrite 04/19/2023 POSITIVE (A)    Leukocytes,Ua 04/19/2023 2+ (A)    WBC, UA 04/19/2023 40-60 (A)    RBC / HPF 04/19/2023 0-2    Squamous Epithelial / HPF 04/19/2023 10-20 (A)    Bacteria, UA 04/19/2023 MANY (A)    Calcium Oxalate Crystal 04/19/2023 MANY (A)    Hyaline Cast 04/19/2023 0-5 (A)    Vitamin B-12 04/19/2023 644    Folate 04/19/2023 5.0 (L)    Note 04/19/2023    Orders Only on 03/31/2023  Component Date Value   Glucose, Bld 03/31/2023 97    BUN 03/31/2023 14    Creat 03/31/2023 0.64    BUN/Creatinine Ratio 03/31/2023 SEE NOTE:    Sodium 03/31/2023 140    Potassium 03/31/2023 3.7    Chloride 03/31/2023 99    CO2 03/31/2023 26    Calcium 03/31/2023 10.2    Total Protein 03/31/2023 8.3 (H)    Albumin 03/31/2023 4.8    Globulin 03/31/2023 3.5    AG Ratio 03/31/2023 1.4    Total Bilirubin 03/31/2023 0.3    Alkaline phosphatase (AP* 03/31/2023 74    AST 03/31/2023 17    ALT 03/31/2023 9    Vitamin B-12 03/31/2023 865    WBC 03/31/2023 13.7 (H)    RBC 03/31/2023 3.94    Hemoglobin 03/31/2023 12.1    HCT 03/31/2023 36.9    MCV 03/31/2023 93.7    MCH 03/31/2023 30.7    MCHC 03/31/2023 32.8    RDW 03/31/2023 12.3    Platelets 03/31/2023 381    MPV 03/31/2023 11.2    Neutro Abs 03/31/2023 9,302 (H)    Absolute Lymphocytes 03/31/2023 3,192    Absolute Monocytes 03/31/2023 877    Eosinophils Absolute 03/31/2023 247    Basophils Absolute 03/31/2023 82    Neutrophils Relative % 03/31/2023 67.9    Total Lymphocyte 03/31/2023 23.3    Monocytes Relative 03/31/2023 6.4    Eosinophils Relative 03/31/2023 1.8    Basophils Relative  03/31/2023 0.6   Office Visit on 03/26/2023  Component Date Value   Path Review 03/31/2023 8   Clinical Support on 03/03/2023  Component Date Value   WBC 03/05/2023 12.1 (H)    RBC 03/05/2023 3.97    Hemoglobin 03/05/2023 12.2    HCT 03/05/2023 36.2    MCV 03/05/2023 91.2    MCH 03/05/2023 30.7    MCHC 03/05/2023 33.7    RDW 03/05/2023 12.8    Platelets 03/05/2023 399    MPV 03/05/2023 11.2    Neutro Abs 03/05/2023 8,797 (H)    Lymphs Abs 03/05/2023 2,190    Absolute Monocytes 03/05/2023 883    Eosinophils Absolute 03/05/2023 145    Basophils Absolute 03/05/2023 85    Neutrophils Relative % 03/05/2023 72.7    Total Lymphocyte 03/05/2023 18.1    Monocytes Relative 03/05/2023 7.3    Eosinophils Relative 03/05/2023 1.2    Basophils Relative 03/05/2023 0.7   Office Visit on 02/26/2023  Component Date Value   Color, UA 02/26/2023 YELLOW    Clarity, UA 02/26/2023 CLOUDY    Glucose, UA 02/26/2023 Negative    Bilirubin, UA 02/26/2023 NEG    Ketones, UA 02/26/2023 NEG    Spec Grav, UA 02/26/2023 1.020    Blood, UA 02/26/2023 NEG    pH, UA 02/26/2023 6.0  Protein, UA 02/26/2023 Negative    Urobilinogen, UA 02/26/2023 0.2    Nitrite, UA 02/26/2023 POS    Leukocytes, UA 02/26/2023 Negative   Office Visit on 02/22/2023  Component Date Value   D-Dimer, Quant 02/22/2023 0.29    Troponin I 02/22/2023 <3    Color, Urine 02/22/2023 YELLOW    APPearance 02/22/2023 CLEAR    Specific Gravity, Urine 02/22/2023 1.015    pH 02/22/2023 6.0    Total Protein, Urine 02/22/2023 NEGATIVE    Urine Glucose 02/22/2023 NEGATIVE    Ketones, ur 02/22/2023 NEGATIVE    Bilirubin Urine 02/22/2023 NEGATIVE    Hgb urine dipstick 02/22/2023 NEGATIVE    Urobilinogen, UA 02/22/2023 0.2    Leukocytes,Ua 02/22/2023 NEGATIVE    Nitrite 02/22/2023 NEGATIVE    WBC, UA 02/22/2023 0-2/hpf    RBC / HPF 02/22/2023 0-2/hpf    Squamous Epithelial / HPF 02/22/2023 Few(5-10/hpf) (A)    Bacteria, UA 02/22/2023  Rare(<10/hpf) (A)    WBC 02/22/2023 13.3 (H)    RBC 02/22/2023 4.18    Hemoglobin 02/22/2023 12.5    HCT 02/22/2023 38.9    MCV 02/22/2023 93.1    MCHC 02/22/2023 32.1    RDW 02/22/2023 13.5    Platelets 02/22/2023 374.0    Neutrophils Relative % 02/22/2023 68.3    Lymphocytes Relative 02/22/2023 22.4    Monocytes Relative 02/22/2023 8.3    Eosinophils Relative 02/22/2023 0.5    Basophils Relative 02/22/2023 0.5    Neutro Abs 02/22/2023 9.1 (H)    Lymphs Abs 02/22/2023 3.0    Monocytes Absolute 02/22/2023 1.1 (H)    Eosinophils Absolute 02/22/2023 0.1    Basophils Absolute 02/22/2023 0.1    Sodium 02/22/2023 139    Potassium 02/22/2023 3.9    Chloride 02/22/2023 101    CO2 02/22/2023 28    Glucose, Bld 02/22/2023 84    BUN 02/22/2023 11    Creatinine, Ser 02/22/2023 0.55    Total Bilirubin 02/22/2023 0.2    Alkaline Phosphatase 02/22/2023 72    AST 02/22/2023 19    ALT 02/22/2023 16    Total Protein 02/22/2023 7.9    Albumin 02/22/2023 4.8    GFR 02/22/2023 109.19    Calcium 02/22/2023 9.9    Color, UA 02/22/2023 yellow    Clarity, UA 02/22/2023 cloudy    Glucose, UA 02/22/2023 Negative    Bilirubin, UA 02/22/2023 Negative    Ketones, UA 02/22/2023 Negative    Spec Grav, UA 02/22/2023 1.020    Blood, UA 02/22/2023 Negative    pH, UA 02/22/2023 6.0    Protein, UA 02/22/2023 Negative    Urobilinogen, UA 02/22/2023 0.2    Nitrite, UA 02/22/2023 Negative    Leukocytes, UA 02/22/2023 Negative    High Sens Troponin I 02/22/2023 4    POC Glucose 02/22/2023 107 (A)   Admission on 02/16/2023, Discharged on 02/16/2023  Component Date Value   WBC 02/16/2023 13.6 (H)    RBC 02/16/2023 3.55 (L)    Hemoglobin 02/16/2023 11.0 (L)    HCT 02/16/2023 33.0 (L)    MCV 02/16/2023 93.0    MCH 02/16/2023 31.0    MCHC 02/16/2023 33.3    RDW 02/16/2023 12.5    Platelets 02/16/2023 250    nRBC 02/16/2023 0.0    Color, Urine 02/16/2023 YELLOW (A)    APPearance 02/16/2023 CLOUDY (A)     Specific Gravity, Urine 02/16/2023 1.016    pH 02/16/2023 5.0    Glucose, UA 02/16/2023 NEGATIVE    Hgb  urine dipstick 02/16/2023 NEGATIVE    Bilirubin Urine 02/16/2023 NEGATIVE    Ketones, ur 02/16/2023 NEGATIVE    Protein, ur 02/16/2023 NEGATIVE    Nitrite 02/16/2023 NEGATIVE    Leukocytes,Ua 02/16/2023 TRACE (A)    RBC / HPF 02/16/2023 0-5    WBC, UA 02/16/2023 6-10    Bacteria, UA 02/16/2023 RARE (A)    Squamous Epithelial / HPF 02/16/2023 21-50    Mucus 02/16/2023 PRESENT    Troponin I (High Sensiti* 02/16/2023 <2    Sodium 02/16/2023 137    Potassium 02/16/2023 3.4 (L)    Chloride 02/16/2023 102    CO2 02/16/2023 24    Glucose, Bld 02/16/2023 132 (H)    BUN 02/16/2023 15    Creatinine, Ser 02/16/2023 0.54    Calcium 02/16/2023 8.8 (L)    Total Protein 02/16/2023 6.9    Albumin 02/16/2023 4.1    AST 02/16/2023 23    ALT 02/16/2023 15    Alkaline Phosphatase 02/16/2023 54    Total Bilirubin 02/16/2023 0.6    GFR, Estimated 02/16/2023 >60    Anion gap 02/16/2023 11    Troponin I (High Sensiti* 02/16/2023 3   Office Visit on 02/11/2023  Component Date Value   Color, UA 02/11/2023 Yellow    Clarity, UA 02/11/2023 clear    Glucose, UA 02/11/2023 Negative    Bilirubin, UA 02/11/2023 negative    Ketones, UA 02/11/2023 15    Spec Grav, UA 02/11/2023 1.025    Blood, UA 02/11/2023 negative    pH, UA 02/11/2023 5.0    Protein, UA 02/11/2023 Negative    Urobilinogen, UA 02/11/2023 0.2    Nitrite, UA 02/11/2023 negative    Leukocytes, UA 02/11/2023 Trace (A)    MICRO NUMBER: 02/11/2023 [id number]    SPECIMEN QUALITY: 02/11/2023 Adequate    Sample Source 02/11/2023 URINE    STATUS: 02/11/2023 FINAL    Result: 02/11/2023                     Value:Mixed genital flora isolated. These superficial bacteria are not indicative of a urinary tract infection. No further organism identification is warranted on this specimen. If clinically indicated, recollect clean-catch, mid-stream  urine and transfer  immediately to Urine Culture Transport Tube.    WBC, UA 02/11/2023 0-2/hpf    RBC / HPF 02/11/2023 0-2/hpf    Mucus, UA 02/11/2023 Presence of (A)    Squamous Epithelial / HPF 02/11/2023 Few(5-10/hpf) (A)    Hyaline Casts, UA 02/11/2023 Presence of (A)   Office Visit on 01/25/2023  Component Date Value   WBC 01/25/2023 11.8 (H)    RBC 01/25/2023 4.02    Hemoglobin 01/25/2023 12.5    HCT 01/25/2023 37.6    MCV 01/25/2023 93.4    MCHC 01/25/2023 33.2    RDW 01/25/2023 13.8    Platelets 01/25/2023 375.0    Neutrophils Relative % 01/25/2023 64.2    Lymphocytes Relative 01/25/2023 27.2    Monocytes Relative 01/25/2023 6.8    Eosinophils Relative 01/25/2023 1.1    Basophils Relative 01/25/2023 0.7    Neutro Abs 01/25/2023 7.6    Lymphs Abs 01/25/2023 3.2    Monocytes Absolute 01/25/2023 0.8    Eosinophils Absolute 01/25/2023 0.1    Basophils Absolute 01/25/2023 0.1    Sodium 01/25/2023 140    Potassium 01/25/2023 3.4 (L)    Chloride 01/25/2023 102    CO2 01/25/2023 24    Glucose, Bld 01/25/2023 153 (H)    BUN 01/25/2023 14  Creatinine, Ser 01/25/2023 0.62    Total Bilirubin 01/25/2023 0.2    Alkaline Phosphatase 01/25/2023 66    AST 01/25/2023 20    ALT 01/25/2023 18    Total Protein 01/25/2023 7.9    Albumin 01/25/2023 4.9    GFR 01/25/2023 106.14    Calcium 01/25/2023 9.8    Hgb A1c MFr Bld 01/25/2023 5.8    Vitamin B-12 01/25/2023 411    HAV 1 IGG,TYPE SPECIFIC * 01/25/2023 27.60 (H)    HSV 2 IGG,TYPE SPECIFIC * 01/25/2023 <0.90    RPR Ser Ql 01/25/2023 NON-REACTIVE    Hepatitis B Surface Ag 01/25/2023 Negative    Hep B E Ag 01/25/2023 Negative    Hep B C IgM 01/25/2023 Negative    Hep B Core Total Ab 01/25/2023 Negative    Hep B E Ab 01/25/2023 Non Reactive    Hep B Surface Ab, Qual 01/25/2023 Non Reactive    Hep C Virus Ab 01/25/2023 Non Reactive    HIV Screen 4th Generatio* 01/25/2023 Non Reactive    Neisseria Gonorrhea 01/25/2023 Negative     Chlamydia 01/25/2023 Negative    Trichomonas 01/25/2023 Negative    Bacterial Vaginitis (gar* 01/25/2023 Negative    Candida Vaginitis 01/25/2023 Positive (A)    Candida Glabrata 01/25/2023 Negative    Comment 01/25/2023 Normal Reference Range Candida Species - Negative    Comment 01/25/2023 Normal Reference Range Candida Galbrata - Negative    Comment 01/25/2023 Normal Reference Range Trichomonas - Negative    Comment 01/25/2023 Normal Reference Ranger Chlamydia - Negative    Comment 01/25/2023 Normal Reference Range Neisseria Gonorrhea - Negative    Comment 01/25/2023 Normal Reference Range Bacterial Vaginosis - Negative    Cholesterol 01/25/2023 255 (H)    Triglycerides 01/25/2023 258.0 (H)    HDL 01/25/2023 81.50    VLDL 01/25/2023 51.6 (H)    Total CHOL/HDL Ratio 01/25/2023 3    NonHDL 01/25/2023 173.08    TSH 01/25/2023 0.82    Direct LDL 01/25/2023 160.0   Office Visit on 12/29/2022  Component Date Value   Color, UA 12/29/2022 yellow    Clarity, UA 12/29/2022 clear    Glucose, UA 12/29/2022 Negative    Bilirubin, UA 12/29/2022 Negative    Ketones, UA 12/29/2022 Negative    Spec Grav, UA 12/29/2022 1.020    Blood, UA 12/29/2022 Negative    pH, UA 12/29/2022 6.0    Protein, UA 12/29/2022 Negative    Urobilinogen, UA 12/29/2022 0.2    Nitrite, UA 12/29/2022 Negative    Leukocytes, UA 12/29/2022 Negative   There may be more visits with results that are not included.   No image results found.   LONG TERM MONITOR (3-14 DAYS)  Result Date: 04/04/2023 NSR with sinus brady (51/min) and sinus tachy (150/min), ave 94/min. Rare PVC's and PAC's, both less than 1% burden. No VT, SVT or atrial fib No prolonged pauses Symptoms recorded coincide with PVC's and NSR. Patch Wear Time:  13 days and 12 hours (2024-09-19T13:[id number] to 2024-10-03T01:27:53-0400)  US Abdomen Limited RUQ (LIVER/GB)  Result Date: 03/03/2023 CLINICAL DATA:  Right upper quadrant abdominal pain for several  days. EXAM: ULTRASOUND ABDOMEN LIMITED RIGHT UPPER QUADRANT COMPARISON:  September 02, 2017. FINDINGS: Gallbladder: No gallstones or wall thickening visualized. No sonographic Murphy sign noted by sonographer. Common bile duct: Diameter: 4 mm which is within normal limits. Liver: No focal lesion identified. Within normal limits in parenchymal echogenicity. Portal vein is patent on color Doppler imaging with normal direction of  blood flow towards the liver. Other: None. Electronically Signed   By: Lupita Raider M.D.   On: 03/03/2023 15:23   DG Chest 2 View  Result Date: 03/01/2023 CLINICAL DATA:  Left-sided chest pain and shortness of breath for 2 weeks EXAM: CHEST - 2 VIEW COMPARISON:  06/05/2021 FINDINGS: Midline trachea.  Normal heart size and mediastinal contours. Sharp costophrenic angles.  No pneumothorax.  Clear lungs. Loop recorder projects over the left side of the chest. IMPRESSION: No active cardiopulmonary disease. Electronically Signed   By: Jeronimo Greaves M.D.   On: 03/01/2023 10:20    US Abdomen Limited RUQ (LIVER/GB)  Result Date: 03/03/2023 CLINICAL DATA:  Right upper quadrant abdominal pain for several days. EXAM: ULTRASOUND ABDOMEN LIMITED RIGHT UPPER QUADRANT COMPARISON:  September 02, 2017. FINDINGS: Gallbladder: No gallstones or wall thickening visualized. No sonographic Murphy sign noted by sonographer. Common bile duct: Diameter: 4 mm which is within normal limits. Liver: No focal lesion identified. Within normal limits in parenchymal echogenicity. Portal vein is patent on color Doppler imaging with normal direction of blood flow towards the liver. Other: None. Electronically Signed   By: Lupita Raider M.D.   On: 03/03/2023 15:23         Additional Info: This encounter employed real-time, collaborative documentation. The patient actively reviewed and updated their medical record on a shared screen, ensuring transparency and facilitating joint problem-solving for the problem list,  overview, and plan. This approach promotes accurate, informed care. The treatment plan was discussed and reviewed in detail, including medication safety, potential side effects, and all patient questions. We confirmed understanding and comfort with the plan. Follow-up instructions were established, including contacting the office for any concerns, returning if symptoms worsen, persist, or new symptoms develop, and precautions for potential emergency department visits.

## 2023-04-21 NOTE — Patient Instructions (Signed)
VISIT SUMMARY:  During today's visit, we discussed your worsening restless leg syndrome (RLS), low iron levels, Suboxone management, depression, and nutritional deficiencies. We reviewed your symptoms and concerns, and we have developed a plan to address each issue.  YOUR PLAN:  -RESTLESS LEG SYNDROME (RLS): Restless Leg Syndrome is a condition that causes an uncontrollable urge to move your legs, usually due to uncomfortable sensations. We will start you on Requip (ropinirole) at a low dose and gradually increase it while monitoring for side effects. This medication should help manage your symptoms until your iron levels are corrected.  -IRON DEFICIENCY: Iron deficiency means you have low levels of iron in your blood, which can contribute to RLS. We will prescribe ferrous gluconate to help increase your iron levels. Be aware of potential constipation, and if it becomes severe, we may consider IV iron. We also encourage you to eat more iron-rich foods.  -SUBOXONE MANAGEMENT: Suboxone is a medication used to treat opioid dependence. We will correct your prescription and send it to a different pharmacy to ensure you receive the appropriate dose without delays.  -DEPRESSION AND ANXIETY: Depression and anxiety are mental health conditions that can affect your mood and overall well-being. You are currently taking Lexapro, and we discussed the possibility of adding Abilify at a low dose to help manage your symptoms. We will consider alternative treatments if you remain uncomfortable with Abilify.  -NUTRITIONAL DEFICIENCY: Nutritional deficiency means your body is not getting enough essential nutrients. We recommend starting a multivitamin and consuming protein shakes if your appetite remains poor.  INSTRUCTIONS:  Please schedule a follow-up appointment in 1-3 months to recheck your iron levels and assess the effectiveness of Requip and iron supplementation.

## 2023-04-21 NOTE — Telephone Encounter (Signed)
Patient's review of lab results/notes letter confirmed.

## 2023-04-21 NOTE — Assessment & Plan Note (Signed)
Suboxone Management   She is currently on Suboxone for opioid dependence but had to reduce the dose due to pharmacy issues, potentially contributing to RLS symptoms. We discussed correcting the prescription and sending it to a different pharmacy to avoid delays. We will correct the Suboxone prescription to ensure the appropriate dose and send the prescription to a different pharmacy if necessary to avoid delays.

## 2023-04-22 ENCOUNTER — Other Ambulatory Visit (HOSPITAL_BASED_OUTPATIENT_CLINIC_OR_DEPARTMENT_OTHER): Payer: Self-pay

## 2023-04-22 ENCOUNTER — Other Ambulatory Visit: Payer: Self-pay

## 2023-04-22 DIAGNOSIS — G2581 Restless legs syndrome: Secondary | ICD-10-CM

## 2023-04-22 MED ORDER — ROPINIROLE HCL 0.25 MG PO TABS: 0.2500 mg | ORAL_TABLET | Freq: Three times a day (TID) | ORAL | 2 refills | Status: DC

## 2023-04-26 ENCOUNTER — Other Ambulatory Visit (HOSPITAL_BASED_OUTPATIENT_CLINIC_OR_DEPARTMENT_OTHER): Payer: Self-pay

## 2023-04-26 ENCOUNTER — Other Ambulatory Visit: Payer: Self-pay

## 2023-05-03 ENCOUNTER — Ambulatory Visit: Payer: Medicaid Other | Attending: Cardiology | Admitting: Cardiology

## 2023-05-04 ENCOUNTER — Encounter: Payer: Self-pay | Admitting: Cardiology

## 2023-05-04 ENCOUNTER — Other Ambulatory Visit: Payer: Self-pay | Admitting: Family Medicine

## 2023-05-04 ENCOUNTER — Encounter: Payer: Self-pay | Admitting: Family Medicine

## 2023-05-04 DIAGNOSIS — J4530 Mild persistent asthma, uncomplicated: Secondary | ICD-10-CM

## 2023-05-04 MED ORDER — BEVESPI AEROSPHERE 9-4.8 MCG/ACT IN AERO: 2.0000 | INHALATION_SPRAY | Freq: Two times a day (BID) | RESPIRATORY_TRACT | 1 refills | Status: DC

## 2023-05-04 NOTE — Progress Notes (Signed)
Pt states harder to breathe.  Albuterol not holding.  Symbicort made her have palpitations.  Bevespi in past worked well.  Has decreased vaping

## 2023-05-05 DIAGNOSIS — R8271 Bacteriuria: Secondary | ICD-10-CM | POA: Diagnosis not present

## 2023-05-05 DIAGNOSIS — R350 Frequency of micturition: Secondary | ICD-10-CM | POA: Diagnosis not present

## 2023-05-05 DIAGNOSIS — N3946 Mixed incontinence: Secondary | ICD-10-CM | POA: Diagnosis not present

## 2023-05-05 DIAGNOSIS — N390 Urinary tract infection, site not specified: Secondary | ICD-10-CM | POA: Diagnosis not present

## 2023-05-10 ENCOUNTER — Other Ambulatory Visit: Payer: Self-pay | Admitting: Family Medicine

## 2023-05-10 ENCOUNTER — Encounter: Payer: Self-pay | Admitting: Family Medicine

## 2023-05-10 ENCOUNTER — Other Ambulatory Visit (HOSPITAL_BASED_OUTPATIENT_CLINIC_OR_DEPARTMENT_OTHER): Payer: Self-pay

## 2023-05-10 ENCOUNTER — Ambulatory Visit (INDEPENDENT_AMBULATORY_CARE_PROVIDER_SITE_OTHER): Payer: Medicaid Other | Admitting: Internal Medicine

## 2023-05-10 ENCOUNTER — Encounter: Payer: Self-pay | Admitting: Internal Medicine

## 2023-05-10 VITALS — BP 134/94 | HR 97 | Temp 98.7°F | Resp 16 | Ht 59.0 in | Wt 141.0 lb

## 2023-05-10 DIAGNOSIS — B351 Tinea unguium: Secondary | ICD-10-CM

## 2023-05-10 DIAGNOSIS — M5432 Sciatica, left side: Secondary | ICD-10-CM

## 2023-05-10 DIAGNOSIS — R8290 Unspecified abnormal findings in urine: Secondary | ICD-10-CM

## 2023-05-10 DIAGNOSIS — E611 Iron deficiency: Secondary | ICD-10-CM

## 2023-05-10 DIAGNOSIS — N390 Urinary tract infection, site not specified: Secondary | ICD-10-CM

## 2023-05-10 DIAGNOSIS — G8929 Other chronic pain: Secondary | ICD-10-CM | POA: Diagnosis not present

## 2023-05-10 DIAGNOSIS — B3731 Acute candidiasis of vulva and vagina: Secondary | ICD-10-CM

## 2023-05-10 DIAGNOSIS — F902 Attention-deficit hyperactivity disorder, combined type: Secondary | ICD-10-CM

## 2023-05-10 DIAGNOSIS — M5431 Sciatica, right side: Secondary | ICD-10-CM | POA: Diagnosis not present

## 2023-05-10 DIAGNOSIS — F1121 Opioid dependence, in remission: Secondary | ICD-10-CM | POA: Diagnosis not present

## 2023-05-10 DIAGNOSIS — L600 Ingrowing nail: Secondary | ICD-10-CM

## 2023-05-10 LAB — POCT URINALYSIS DIPSTICK
Bilirubin, UA: NEGATIVE
Blood, UA: NEGATIVE
Glucose, UA: NEGATIVE
Ketones, UA: NEGATIVE
Leukocytes, UA: NEGATIVE
Nitrite, UA: POSITIVE
Protein, UA: NEGATIVE
Spec Grav, UA: >=1.03 — AB (ref 1.010–1.025)
Urobilinogen, UA: 0.2 U/dL
pH, UA: 5.5 (ref 5.0–8.0)

## 2023-05-10 MED ORDER — KETOROLAC TROMETHAMINE 60 MG/2ML IM SOLN: 60.0000 mg | Freq: Once | INTRAMUSCULAR | Status: DC

## 2023-05-10 MED ORDER — FLUCONAZOLE 150 MG PO TABS: 150.0000 mg | ORAL_TABLET | ORAL | 0 refills | Status: DC | PRN

## 2023-05-10 MED ORDER — CICLOPIROX OLAMINE 0.77 % EX CREA: TOPICAL_CREAM | Freq: Two times a day (BID) | CUTANEOUS | 3 refills | Status: DC

## 2023-05-10 MED ORDER — BUPRENORPHINE HCL-NALOXONE HCL 8-2 MG SL FILM
2.0000 | ORAL_FILM | Freq: Every day | SUBLINGUAL | 0 refills | Status: DC
  Filled 2023-05-10 – 2023-05-19 (×4): qty 60, 30d supply, fill #0

## 2023-05-10 MED ORDER — KETOROLAC TROMETHAMINE 60 MG/2ML IM SOLN
60.0000 mg | Freq: Once | INTRAMUSCULAR | Status: AC
  Administered 2023-05-10: 60 mg via INTRAMUSCULAR

## 2023-05-10 MED ORDER — AMPHETAMINE-DEXTROAMPHETAMINE 20 MG PO TABS: 20.0000 mg | ORAL_TABLET | Freq: Two times a day (BID) | ORAL | 0 refills | Status: DC

## 2023-05-10 MED ORDER — CEFDINIR 300 MG PO CAPS: 300.0000 mg | ORAL_CAPSULE | Freq: Two times a day (BID) | ORAL | 0 refills | Status: AC

## 2023-05-10 NOTE — Progress Notes (Signed)
[facility]: [phone]   -- Medical Office Visit --  Patient:  Laurie Roman      Age: 47 y.o.       Sex:  female  Date:   05/10/2023 Today's Healthcare Provider: [name], MD  ==========================================================================    This appointment was scheduled for me to manage suboxone.  She normally following with Dr. Ruthine Dose.  However, she has multiple acute complaints/concern(s) she request I address in addition to suboxone. Assessment & Plan Opioid dependence in remission (HCC)  Suboxone 8 mg twice daily is effective in preventing cravings but does not alleviate her pain. She prefers not to increase the Suboxone dose. We will refill Suboxone for 30 days and discuss a referral to a pain specialist for interventional treatments. Left sided sciatica  Right sided sciatica  Malodorous urine  Onychomycosis Onychomycosis   A thickened toenail following trauma may indicate a fungal infection. We discussed using ciclopirox cream, which has a 70% cure rate over one year, and recommended a foot specialist evaluation. We will prescribe ciclopirox cream and refer her to a foot specialist for further evaluation. Ingrown toenail Refer care to podiatry Chronic bilateral low back pain with bilateral sciatica Chronic Pain   She reports persistent pain despite Suboxone and gabapentin use, with new leg numbness and weakness, suggesting sciatica or spinal issues. An MRI in July showed no compression, but symptoms have worsened. We will order an MRI of the lumbar spine, refer her to pain management for interventional treatments, and consider physical therapy after reviewing MRI results. 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  A daily low-dose antibiotic was prescribed but has not been picked up due to pharmacy closure. We discussed the risks of recurrent infections and the potential for a bladder mesh infection. We will perform a urinalysis with micro A and reflex culture and encourage her to pick up the prescribed daily antibiotic. Iron deficiency Iron Deficiency Anemia   Low iron levels and a drop in hemoglobin are likely due to chronic blood loss in urine. We discussed the need to address the underlying cause of hematuria with a urologist. She should ensure she is taking iron supplements.  Diagnoses and all orders for this visit: Opioid dependence in remission [facility]) Left sided sciatica -     Ambulatory referral to Pain Clinic -     ketorolac (TORADOL) injection 60 mg Right sided sciatica -     Ambulatory referral to Pain Clinic -     ketorolac (TORADOL) injection 60 mg Malodorous urine -     Urinalysis w microscopic + reflex cultur Onychomycosis -     ciclopirox (LOPROX) 0.77 % cream; Apply topically 2 (two) times daily. 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 Fill when due Cloudy urine -     POCT Urinalysis Dipstick Acute cystitis without hematuria -     cefdinir (OMNICEF) 300  MG capsule; Take 1 capsule (300 mg total) by mouth 2 (two) times daily for 7 days. Vaginal candidiasis -     fluconazole (DIFLUCAN) 150 MG tablet; Take 1 tablet (150 mg total) by mouth every three (3) days as needed. Recurrent UTI Iron deficiency  Recommended follow-up: Return in about 1 month (around 06/12/2023) for pain mgmt..No future appointments.Patient Care  Team: Jeani Sow, MD as PCP - General (Family Medicine) Shaune Leeks as Social Worker  General Health Maintenance   Routine health maintenance and medication refills were discussed. We provided a Toradol injection for acute pain and wrote a letter for a height-adjustable chair. We will refill gabapentin and Aimovig.  Follow-up   We will schedule a follow-up appointment with her primary care provider, follow up with a urologist in 5-6 weeks, follow up with a neurologist in early January, and schedule the next appointment with me for suboxone mgmt in one month.  SUBJECTIVE: 47 y.o. female who has Migraine; ADHD; Essential hypertension; Mild obstructive sleep apnea; Controlled substance agreement signed; Gastroesophageal reflux disease without esophagitis; Left sided sciatica; Asthma; Maxillary sinusitis; Anxiety; Primary insomnia; Depression, major, single episode, severe (HCC); Cervicalgia; Chronic dental pain; Opioid dependence (HCC); B12 deficiency; Urinary incontinence; Flank pain; Uric acid crystalluria; Right sided sciatica; Intertrigo; Chronic bilateral low back pain with sciatica; Chronic right hip pain; Tachycardia; and Drug-induced constipation on their problem list.  Main reasons for visit/main concerns/chief complaint: Pain Management    AI-Extracted: Discussed the use of AI scribe software for clinical note transcription with the patient, who gave verbal consent to proceed.  History of Present Illness   The patient, currently on Suboxone for pain management, reports a satisfactory response to the medication in terms of curbing cravings, but expresses frustration at the persistent pain. The patient also takes gabapentin for pain, but reports no significant relief. The patient has a history of recurrent urinary tract infections (UTIs) and has been experiencing malodorous urine. She has been prescribed a daily antibiotic and a new medication, Gemtesa, for bladder spasms, but has  not yet started these medications due to pharmacy closure over the holiday period.  The patient has a history of bladder sling placement and has been experiencing increased urinary incontinence, particularly at night, which is causing significant distress. She has recently undergone a urology evaluation and cystoscopy, but reports dissatisfaction with the urologist's focus on the recurrent UTIs rather than the incontinence issue.  The patient also reports new bilateral leg numbness and weakness, and worsening back pain. She has a history of kidney stones and is concerned that these symptoms may be related. She has had an MRI of the lumbar spine in the past, but is considering a repeat study due to the new symptoms.  The patient has been experiencing edema in the right foot, which is more pronounced than the left. She also reports a thickened, possibly fungal, toenail on the right foot after a trauma. The patient has been taking iron supplements due to low iron levels and a drop in hemoglobin, which is suspected to be due to chronic blood in the urine.  The patient is also on Aimovig for migraines and Adderall for ADHD, and has been managing these conditions with another provider. She expresses a desire to keep these medications under the management of her primary care provider. The patient also reports a recent increase in alcohol consumption, particularly around the holiday season.       Note that patient  has a past medical history  of ADD (attention deficit disorder), Anxiety, Asthma, Blood transfusion without reported diagnosis, Depression, GERD (gastroesophageal reflux disease), IBS (irritable bowel syndrome), Insomnia, Lumbago, Migraine, Opioid dependence (HCC) (08/21/2022), Other chest pain (08/17/2022), Sciatica, Sleep apnea, Tietze's disease, and Urinary incontinence (10/26/2022).  Problem list overviews that were updated at today's visit:No problems updated.  Med reconciliation: Current  Outpatient Medications on File Prior to Visit  Medication Sig   albuterol (VENTOLIN HFA) 108 (90 Base) MCG/ACT inhaler Inhale 2 puffs into the lungs every 6 (six) hours as needed for wheezing or shortness of breath.   amLODipine (NORVASC) 10 MG tablet Take 1 tablet (10 mg total) by mouth daily. Replaces amlodipine 5, for uncontrolled blood pressure.   ARIPiprazole (ABILIFY) 2 MG tablet Take 1 tablet (2 mg total) by mouth daily as needed.   cephALEXin (KEFLEX) 500 MG capsule Take 1 capsule (500 mg total) by mouth 3 (three) times daily.   chlorhexidine (PERIDEX) 0.12 % solution Use as directed 15 mLs in the mouth or throat 2 (two) times daily.   Cyanocobalamin (B-12) 1000 MCG SUBL Place 1 tablet under the tongue daily at 6 (six) AM.   doxepin (SINEQUAN) 10 MG capsule Take 1 capsule (10 mg total) by mouth at bedtime.   escitalopram (LEXAPRO) 20 MG tablet Take 1 tablet (20 mg total) by mouth daily.   ferrous gluconate (FERGON) 324 MG tablet Take 1 tablet (324 mg total) by mouth daily. Take with laxatives.   fluconazole (DIFLUCAN) 150 MG tablet Take 1 tablet (150 mg total) by mouth every three (3) days as needed.   fluticasone (FLONASE) 50 MCG/ACT nasal spray Place 1 spray into both nostrils daily.   gabapentin (NEURONTIN) 800 MG tablet Take 1 tablet (800 mg total) by mouth 3 (three) times daily. Replaces prior dosing schedule.   Glycopyrrolate-Formoterol (BEVESPI AEROSPHERE) 9-4.8 MCG/ACT AERO Inhale 2 puffs into the lungs 2 (two) times daily.   ketorolac (TORADOL) 10 MG tablet Take 1 tablet (10 mg total) by mouth every 6 (six) hours as needed.   linaclotide (LINZESS) 145 MCG CAPS capsule Take 1 capsule (145 mcg total) by mouth daily before breakfast.   losartan (COZAAR) 100 MG tablet Take 1 tablet (100 mg total) by mouth daily.   metoprolol tartrate (LOPRESSOR) 25 MG tablet Take 1 tablet (25 mg total) by mouth 2 (two) times daily.   nystatin (MYCOSTATIN) 100000 UNIT/ML suspension Take 5 mLs (500,000  Units total) by mouth 4 (four) times daily. Swish and hold in mouth few seconds and swallow   nystatin (MYCOSTATIN/NYSTOP) powder Apply 1 Application topically 3 (three) times daily.   nystatin cream (MYCOSTATIN) Apply 1 Application topically 2 (two) times daily.   ondansetron (ZOFRAN) 4 MG tablet Take 1 tablet (4 mg total) by mouth every 8 (eight) hours as needed for nausea or vomiting.   pantoprazole (PROTONIX) 40 MG tablet Take 1 tablet (40 mg total) by mouth daily.   potassium chloride (KLOR-CON M) 20 MEQ tablet Take 1 tablet (20 mEq total) by mouth daily.   Probiotic Product (PROBIOTIC DAILY) CAPS Take 1 capsule by mouth daily.   rizatriptan (MAXALT) 10 MG tablet Take 1 tablet (10 mg total) by mouth as needed for migraine. May repeat in 2 hours if needed   rOPINIRole (REQUIP) 0.25 MG tablet Take 1 tablet (0.25 mg total) by mouth 3 (three) times daily.   tiZANidine (ZANAFLEX) 4 MG tablet Take 1 tablet (4 mg total) by mouth every 6 (six) hours as needed for muscle spasms.   trimethoprim (  TRIMPEX) 100 MG tablet Take 100 mg by mouth daily.   Current Facility-Administered Medications on File Prior to Visit  Medication   ondansetron (ZOFRAN-ODT) disintegrating tablet 4 mg   Medications Discontinued During This Encounter  Medication Reason   Buprenorphine HCl-Naloxone HCl (SUBOXONE) 8-2 MG FILM Reorder   magic mouthwash (nystatin, lidocaine, diphenhydrAMINE, alum & mag hydroxide) suspension    ketorolac (TORADOL) injection 60 mg      Objective   Physical Exam     05/10/2023   12:43 PM 04/21/2023    4:53 PM 04/21/2023    4:26 PM  Vitals with BMI  Height 4\' 11"   4\' 11"   Weight 141 lbs  141 lbs 10 oz  BMI 28.46  28.58  Systolic 134 129 161  Diastolic 94 90 96  Pulse 97  95   Wt Readings from Last 10 Encounters:  05/10/23 141 lb (64 kg)  04/21/23 141 lb 9.6 oz (64.2 kg)  04/19/23 141 lb 9.6 oz (64.2 kg)  04/02/23 141 lb 3.2 oz (64 kg)  03/26/23 141 lb 9.6 oz (64.2 kg)  03/24/23  145 lb (65.8 kg)  03/10/23 143 lb (64.9 kg)  02/26/23 147 lb (66.7 kg)  02/22/23 144 lb (65.3 kg)  02/16/23 144 lb 9.6 oz (65.6 kg)   Vital signs reviewed.  Nursing notes reviewed. Weight trend reviewed.   General Appearance:  No acute distress appreciable.   Well-groomed, healthy-appearing female.  Well proportioned with no abnormal fat distribution.  Good muscle tone. Pulmonary:  Normal work of breathing at rest, no respiratory distress apparent. SpO2: 98 %  Musculoskeletal: All extremities are intact.  Neurological:  Awake, alert, oriented, and engaged.  No obvious focal neurological deficits or cognitive impairments.  Sensorium seems unclouded.   Speech is clear and coherent with logical content. Psychiatric:  Appropriate mood, pleasant and cooperative demeanor, thoughtful and engaged during the exam  Results   LABS Hemoglobin: 10 Neutrophils: high  RADIOLOGY Lumbar spine MRI: No compression on the spine (12/2022)     Last CBC Lab Results  Component Value Date   WBC 12.9 (H) 04/19/2023   HGB 10.7 (L) 04/19/2023   HCT 32.4 (L) 04/19/2023   MCV 92.3 04/19/2023   MCH 30.5 04/19/2023   RDW 12.5 04/19/2023   PLT 290 04/19/2023   Last metabolic panel Lab Results  Component Value Date   GLUCOSE 76 04/19/2023   NA 139 04/19/2023   K 4.7 04/19/2023   CL 103 04/19/2023   CO2 23 04/19/2023   BUN 20 04/19/2023   CREATININE 0.64 04/19/2023   GFR 109.19 02/22/2023   CALCIUM 9.2 04/19/2023   PROT 7.0 04/19/2023   ALBUMIN 4.8 02/22/2023   LABGLOB 2.6 08/11/2018   AGRATIO 1.8 08/11/2018   BILITOT 0.2 04/19/2023   ALKPHOS 72 02/22/2023   AST 16 04/19/2023   ALT 12 04/19/2023   ANIONGAP 11 02/16/2023       Results for orders placed or performed in visit on 05/10/23  POCT Urinalysis Dipstick  Result Value Ref Range   Color, UA yellow    Clarity, UA cloudy    Glucose, UA Negative Negative   Bilirubin, UA Negative    Ketones, UA Negative    Spec Grav, UA >=1.030 (A)  1.010 - 1.025   Blood, UA Negative    pH, UA 5.5 5.0 - 8.0   Protein, UA Negative Negative   Urobilinogen, UA 0.2 0.2 or 1.0 E.U./dL   Nitrite, UA Positive    Leukocytes, UA  Negative Negative   Appearance     Odor      Office Visit on 05/10/2023  Component Date Value   Color, UA 05/10/2023 yellow    Clarity, UA 05/10/2023 cloudy    Glucose, UA 05/10/2023 Negative    Bilirubin, UA 05/10/2023 Negative    Ketones, UA 05/10/2023 Negative    Spec Grav, UA 05/10/2023 >=1.030 (A)    Blood, UA 05/10/2023 Negative    pH, UA 05/10/2023 5.5    Protein, UA 05/10/2023 Negative    Urobilinogen, UA 05/10/2023 0.2    Nitrite, UA 05/10/2023 Positive    Leukocytes, UA 05/10/2023 Negative   Office Visit on 04/19/2023  Component Date Value   MICRO NUMBER: 04/19/2023 [id number]    SPECIMEN QUALITY: 04/19/2023 Adequate    Sample Source 04/19/2023 NOT GIVEN    STATUS: 04/19/2023 FINAL    Result: 04/19/2023                     Value:Mixed genital flora isolated. These superficial bacteria are not indicative of a urinary tract infection. No further organism identification is warranted on this specimen. If clinically indicated, recollect clean-catch, mid-stream urine and transfer  immediately to Urine Culture Transport Tube.    Color, UA 04/19/2023 yellow    Clarity, UA 04/19/2023 cloudy    Glucose, UA 04/19/2023 Negative    Bilirubin, UA 04/19/2023 Negative    Ketones, UA 04/19/2023 Negative    Spec Grav, UA 04/19/2023 >=1.030 (A)    Blood, UA 04/19/2023 Positive    pH, UA 04/19/2023 5.0    Protein, UA 04/19/2023 Positive (A)    Urobilinogen, UA 04/19/2023 0.2    Nitrite, UA 04/19/2023 Negative    Leukocytes, UA 04/19/2023 Moderate (2+) (A)    Iron 04/19/2023 24 (L)    TIBC 04/19/2023 326    %SAT 04/19/2023 7 (L)    Ferritin 04/19/2023 25    Glucose, Bld 04/19/2023 76    BUN 04/19/2023 20    Creat 04/19/2023 0.64    BUN/Creatinine Ratio 04/19/2023 SEE NOTE:    Sodium 04/19/2023 139     Potassium 04/19/2023 4.7    Chloride 04/19/2023 103    CO2 04/19/2023 23    Calcium 04/19/2023 9.2    Total Protein 04/19/2023 7.0    Albumin 04/19/2023 4.4    Globulin 04/19/2023 2.6    AG Ratio 04/19/2023 1.7    Total Bilirubin 04/19/2023 0.2    Alkaline phosphatase (AP* 04/19/2023 77    AST 04/19/2023 16    ALT 04/19/2023 12    WBC 04/19/2023 12.9 (H)    RBC 04/19/2023 3.51 (L)    Hemoglobin 04/19/2023 10.7 (L)    HCT 04/19/2023 32.4 (L)    MCV 04/19/2023 92.3    MCH 04/19/2023 30.5    MCHC 04/19/2023 33.0    RDW 04/19/2023 12.5    Platelets 04/19/2023 290    MPV 04/19/2023 12.6 (H)    Neutro Abs 04/19/2023 8,940 (H)    Absolute Lymphocytes 04/19/2023 2,619    Absolute Monocytes 04/19/2023 1,109 (H)    Eosinophils Absolute 04/19/2023 168    Basophils Absolute 04/19/2023 65    Neutrophils Relative % 04/19/2023 69.3    Total Lymphocyte 04/19/2023 20.3    Monocytes Relative 04/19/2023 8.6    Eosinophils Relative 04/19/2023 1.3    Basophils Relative 04/19/2023 0.5    Smear Review 04/19/2023     Color, Urine 04/19/2023 YELLOW    APPearance 04/19/2023 CLOUDY (A)    Specific Gravity,  Urine 04/19/2023 1.025    pH 04/19/2023 < OR = 5.0    Glucose, UA 04/19/2023 NEGATIVE    Bilirubin Urine 04/19/2023 NEGATIVE    Ketones, ur 04/19/2023 NEGATIVE    Hgb urine dipstick 04/19/2023 NEGATIVE    Protein, ur 04/19/2023 TRACE (A)    Nitrite 04/19/2023 POSITIVE (A)    Leukocytes,Ua 04/19/2023 2+ (A)    WBC, UA 04/19/2023 40-60 (A)    RBC / HPF 04/19/2023 0-2    Squamous Epithelial / HPF 04/19/2023 10-20 (A)    Bacteria, UA 04/19/2023 MANY (A)    Calcium Oxalate Crystal 04/19/2023 MANY (A)    Hyaline Cast 04/19/2023 0-5 (A)    Vitamin B-12 04/19/2023 644    Folate 04/19/2023 5.0 (L)    Note 04/19/2023    Orders Only on 03/31/2023  Component Date Value   Glucose, Bld 03/31/2023 97    BUN 03/31/2023 14    Creat 03/31/2023 0.64    BUN/Creatinine Ratio 03/31/2023 SEE NOTE:     Sodium 03/31/2023 140    Potassium 03/31/2023 3.7    Chloride 03/31/2023 99    CO2 03/31/2023 26    Calcium 03/31/2023 10.2    Total Protein 03/31/2023 8.3 (H)    Albumin 03/31/2023 4.8    Globulin 03/31/2023 3.5    AG Ratio 03/31/2023 1.4    Total Bilirubin 03/31/2023 0.3    Alkaline phosphatase (AP* 03/31/2023 74    AST 03/31/2023 17    ALT 03/31/2023 9    Vitamin B-12 03/31/2023 865    WBC 03/31/2023 13.7 (H)    RBC 03/31/2023 3.94    Hemoglobin 03/31/2023 12.1    HCT 03/31/2023 36.9    MCV 03/31/2023 93.7    MCH 03/31/2023 30.7    MCHC 03/31/2023 32.8    RDW 03/31/2023 12.3    Platelets 03/31/2023 381    MPV 03/31/2023 11.2    Neutro Abs 03/31/2023 9,302 (H)    Absolute Lymphocytes 03/31/2023 3,192    Absolute Monocytes 03/31/2023 877    Eosinophils Absolute 03/31/2023 247    Basophils Absolute 03/31/2023 82    Neutrophils Relative % 03/31/2023 67.9    Total Lymphocyte 03/31/2023 23.3    Monocytes Relative 03/31/2023 6.4    Eosinophils Relative 03/31/2023 1.8    Basophils Relative 03/31/2023 0.6   Office Visit on 03/26/2023  Component Date Value   Path Review 03/31/2023 8   Clinical Support on 03/03/2023  Component Date Value   WBC 03/05/2023 12.1 (H)    RBC 03/05/2023 3.97    Hemoglobin 03/05/2023 12.2    HCT 03/05/2023 36.2    MCV 03/05/2023 91.2    MCH 03/05/2023 30.7    MCHC 03/05/2023 33.7    RDW 03/05/2023 12.8    Platelets 03/05/2023 399    MPV 03/05/2023 11.2    Neutro Abs 03/05/2023 8,797 (H)    Lymphs Abs 03/05/2023 2,190    Absolute Monocytes 03/05/2023 883    Eosinophils Absolute 03/05/2023 145    Basophils Absolute 03/05/2023 85    Neutrophils Relative % 03/05/2023 72.7    Total Lymphocyte 03/05/2023 18.1    Monocytes Relative 03/05/2023 7.3    Eosinophils Relative 03/05/2023 1.2    Basophils Relative 03/05/2023 0.7   Office Visit on 02/26/2023  Component Date Value   Color, UA 02/26/2023 YELLOW    Clarity, UA 02/26/2023 CLOUDY     Glucose, UA 02/26/2023 Negative    Bilirubin, UA 02/26/2023 NEG    Ketones, UA 02/26/2023 NEG  Spec Grav, UA 02/26/2023 1.020    Blood, UA 02/26/2023 NEG    pH, UA 02/26/2023 6.0    Protein, UA 02/26/2023 Negative    Urobilinogen, UA 02/26/2023 0.2    Nitrite, UA 02/26/2023 POS    Leukocytes, UA 02/26/2023 Negative   Office Visit on 02/22/2023  Component Date Value   D-Dimer, Quant 02/22/2023 0.29    Troponin I 02/22/2023 <3    Color, Urine 02/22/2023 YELLOW    APPearance 02/22/2023 CLEAR    Specific Gravity, Urine 02/22/2023 1.015    pH 02/22/2023 6.0    Total Protein, Urine 02/22/2023 NEGATIVE    Urine Glucose 02/22/2023 NEGATIVE    Ketones, ur 02/22/2023 NEGATIVE    Bilirubin Urine 02/22/2023 NEGATIVE    Hgb urine dipstick 02/22/2023 NEGATIVE    Urobilinogen, UA 02/22/2023 0.2    Leukocytes,Ua 02/22/2023 NEGATIVE    Nitrite 02/22/2023 NEGATIVE    WBC, UA 02/22/2023 0-2/hpf    RBC / HPF 02/22/2023 0-2/hpf    Squamous Epithelial / HPF 02/22/2023 Few(5-10/hpf) (A)    Bacteria, UA 02/22/2023 Rare(<10/hpf) (A)    WBC 02/22/2023 13.3 (H)    RBC 02/22/2023 4.18    Hemoglobin 02/22/2023 12.5    HCT 02/22/2023 38.9    MCV 02/22/2023 93.1    MCHC 02/22/2023 32.1    RDW 02/22/2023 13.5    Platelets 02/22/2023 374.0    Neutrophils Relative % 02/22/2023 68.3    Lymphocytes Relative 02/22/2023 22.4    Monocytes Relative 02/22/2023 8.3    Eosinophils Relative 02/22/2023 0.5    Basophils Relative 02/22/2023 0.5    Neutro Abs 02/22/2023 9.1 (H)    Lymphs Abs 02/22/2023 3.0    Monocytes Absolute 02/22/2023 1.1 (H)    Eosinophils Absolute 02/22/2023 0.1    Basophils Absolute 02/22/2023 0.1    Sodium 02/22/2023 139    Potassium 02/22/2023 3.9    Chloride 02/22/2023 101    CO2 02/22/2023 28    Glucose, Bld 02/22/2023 84    BUN 02/22/2023 11    Creatinine, Ser 02/22/2023 0.55    Total Bilirubin 02/22/2023 0.2    Alkaline Phosphatase 02/22/2023 72    AST 02/22/2023 19    ALT  02/22/2023 16    Total Protein 02/22/2023 7.9    Albumin 02/22/2023 4.8    GFR 02/22/2023 109.19    Calcium 02/22/2023 9.9    Color, UA 02/22/2023 yellow    Clarity, UA 02/22/2023 cloudy    Glucose, UA 02/22/2023 Negative    Bilirubin, UA 02/22/2023 Negative    Ketones, UA 02/22/2023 Negative    Spec Grav, UA 02/22/2023 1.020    Blood, UA 02/22/2023 Negative    pH, UA 02/22/2023 6.0    Protein, UA 02/22/2023 Negative    Urobilinogen, UA 02/22/2023 0.2    Nitrite, UA 02/22/2023 Negative    Leukocytes, UA 02/22/2023 Negative    High Sens Troponin I 02/22/2023 4    POC Glucose 02/22/2023 107 (A)   Admission on 02/16/2023, Discharged on 02/16/2023  Component Date Value   WBC 02/16/2023 13.6 (H)    RBC 02/16/2023 3.55 (L)    Hemoglobin 02/16/2023 11.0 (L)    HCT 02/16/2023 33.0 (L)    MCV 02/16/2023 93.0    MCH 02/16/2023 31.0    MCHC 02/16/2023 33.3    RDW 02/16/2023 12.5    Platelets 02/16/2023 250    nRBC 02/16/2023 0.0    Color, Urine 02/16/2023 YELLOW (A)    APPearance 02/16/2023 CLOUDY (A)    Specific  Gravity, Urine 02/16/2023 1.016    pH 02/16/2023 5.0    Glucose, UA 02/16/2023 NEGATIVE    Hgb urine dipstick 02/16/2023 NEGATIVE    Bilirubin Urine 02/16/2023 NEGATIVE    Ketones, ur 02/16/2023 NEGATIVE    Protein, ur 02/16/2023 NEGATIVE    Nitrite 02/16/2023 NEGATIVE    Leukocytes,Ua 02/16/2023 TRACE (A)    RBC / HPF 02/16/2023 0-5    WBC, UA 02/16/2023 6-10    Bacteria, UA 02/16/2023 RARE (A)    Squamous Epithelial / HPF 02/16/2023 21-50    Mucus 02/16/2023 PRESENT    Troponin I (High Sensiti* 02/16/2023 <2    Sodium 02/16/2023 137    Potassium 02/16/2023 3.4 (L)    Chloride 02/16/2023 102    CO2 02/16/2023 24    Glucose, Bld 02/16/2023 132 (H)    BUN 02/16/2023 15    Creatinine, Ser 02/16/2023 0.54    Calcium 02/16/2023 8.8 (L)    Total Protein 02/16/2023 6.9    Albumin 02/16/2023 4.1    AST 02/16/2023 23    ALT 02/16/2023 15    Alkaline Phosphatase  02/16/2023 54    Total Bilirubin 02/16/2023 0.6    GFR, Estimated 02/16/2023 >60    Anion gap 02/16/2023 11    Troponin I (High Sensiti* 02/16/2023 3   Office Visit on 02/11/2023  Component Date Value   Color, UA 02/11/2023 Yellow    Clarity, UA 02/11/2023 clear    Glucose, UA 02/11/2023 Negative    Bilirubin, UA 02/11/2023 negative    Ketones, UA 02/11/2023 15    Spec Grav, UA 02/11/2023 1.025    Blood, UA 02/11/2023 negative    pH, UA 02/11/2023 5.0    Protein, UA 02/11/2023 Negative    Urobilinogen, UA 02/11/2023 0.2    Nitrite, UA 02/11/2023 negative    Leukocytes, UA 02/11/2023 Trace (A)    MICRO NUMBER: 02/11/2023 [id number]    SPECIMEN QUALITY: 02/11/2023 Adequate    Sample Source 02/11/2023 URINE    STATUS: 02/11/2023 FINAL    Result: 02/11/2023                     Value:Mixed genital flora isolated. These superficial bacteria are not indicative of a urinary tract infection. No further organism identification is warranted on this specimen. If clinically indicated, recollect clean-catch, mid-stream urine and transfer  immediately to Urine Culture Transport Tube.    WBC, UA 02/11/2023 0-2/hpf    RBC / HPF 02/11/2023 0-2/hpf    Mucus, UA 02/11/2023 Presence of (A)    Squamous Epithelial / HPF 02/11/2023 Few(5-10/hpf) (A)    Hyaline Casts, UA 02/11/2023 Presence of (A)   Office Visit on 01/25/2023  Component Date Value   WBC 01/25/2023 11.8 (H)    RBC 01/25/2023 4.02    Hemoglobin 01/25/2023 12.5    HCT 01/25/2023 37.6    MCV 01/25/2023 93.4    MCHC 01/25/2023 33.2    RDW 01/25/2023 13.8    Platelets 01/25/2023 375.0    Neutrophils Relative % 01/25/2023 64.2    Lymphocytes Relative 01/25/2023 27.2    Monocytes Relative 01/25/2023 6.8    Eosinophils Relative 01/25/2023 1.1    Basophils Relative 01/25/2023 0.7    Neutro Abs 01/25/2023 7.6    Lymphs Abs 01/25/2023 3.2    Monocytes Absolute 01/25/2023 0.8    Eosinophils Absolute 01/25/2023 0.1    Basophils Absolute  01/25/2023 0.1    Sodium 01/25/2023 140    Potassium 01/25/2023 3.4 (L)    Chloride  01/25/2023 102    CO2 01/25/2023 24    Glucose, Bld 01/25/2023 153 (H)    BUN 01/25/2023 14    Creatinine, Ser 01/25/2023 0.62    Total Bilirubin 01/25/2023 0.2    Alkaline Phosphatase 01/25/2023 66    AST 01/25/2023 20    ALT 01/25/2023 18    Total Protein 01/25/2023 7.9    Albumin 01/25/2023 4.9    GFR 01/25/2023 106.14    Calcium 01/25/2023 9.8    Hgb A1c MFr Bld 01/25/2023 5.8    Vitamin B-12 01/25/2023 411    HAV 1 IGG,TYPE SPECIFIC * 01/25/2023 27.60 (H)    HSV 2 IGG,TYPE SPECIFIC * 01/25/2023 <0.90    RPR Ser Ql 01/25/2023 NON-REACTIVE    Hepatitis B Surface Ag 01/25/2023 Negative    Hep B E Ag 01/25/2023 Negative    Hep B C IgM 01/25/2023 Negative    Hep B Core Total Ab 01/25/2023 Negative    Hep B E Ab 01/25/2023 Non Reactive    Hep B Surface Ab, Qual 01/25/2023 Non Reactive    Hep C Virus Ab 01/25/2023 Non Reactive    HIV Screen 4th Generatio* 01/25/2023 Non Reactive    Neisseria Gonorrhea 01/25/2023 Negative    Chlamydia 01/25/2023 Negative    Trichomonas 01/25/2023 Negative    Bacterial Vaginitis (gar* 01/25/2023 Negative    Candida Vaginitis 01/25/2023 Positive (A)    Candida Glabrata 01/25/2023 Negative    Comment 01/25/2023 Normal Reference Range Candida Species - Negative    Comment 01/25/2023 Normal Reference Range Candida Galbrata - Negative    Comment 01/25/2023 Normal Reference Range Trichomonas - Negative    Comment 01/25/2023 Normal Reference Ranger Chlamydia - Negative    Comment 01/25/2023 Normal Reference Range Neisseria Gonorrhea - Negative    Comment 01/25/2023 Normal Reference Range Bacterial Vaginosis - Negative    Cholesterol 01/25/2023 255 (H)    Triglycerides 01/25/2023 258.0 (H)    HDL 01/25/2023 81.50    VLDL 01/25/2023 51.6 (H)    Total CHOL/HDL Ratio 01/25/2023 3    NonHDL 01/25/2023 173.08    TSH 01/25/2023 0.82    Direct LDL 01/25/2023 160.0    There may be more visits with results that are not included.   No image results found.   LONG TERM MONITOR (3-14 DAYS)  Result Date: 04/04/2023 NSR with sinus brady (51/min) and sinus tachy (150/min), ave 94/min. Rare PVC's and PAC's, both less than 1% burden. No VT, SVT or atrial fib No prolonged pauses Symptoms recorded coincide with PVC's and NSR. Patch Wear Time:  13 days and 12 hours (2024-09-19T13:21:11-398 to 2024-10-03T01:27:53-0400)  US Abdomen Limited RUQ (LIVER/GB)  Result Date: 03/03/2023 CLINICAL DATA:  Right upper quadrant abdominal pain for several days. EXAM: ULTRASOUND ABDOMEN LIMITED RIGHT UPPER QUADRANT COMPARISON:  September 02, 2017. FINDINGS: Gallbladder: No gallstones or wall thickening visualized. No sonographic Murphy sign noted by sonographer. Common bile duct: Diameter: 4 mm which is within normal limits. Liver: No focal lesion identified. Within normal limits in parenchymal echogenicity. Portal vein is patent on color Doppler imaging with normal direction of blood flow towards the liver. Other: None. Electronically Signed   By: Lupita Raider M.D.   On: 03/03/2023 15:23   DG Chest 2 View  Result Date: 03/01/2023 CLINICAL DATA:  Left-sided chest pain and shortness of breath for 2 weeks EXAM: CHEST - 2 VIEW COMPARISON:  06/05/2021 FINDINGS: Midline trachea.  Normal heart size and mediastinal contours. Sharp costophrenic angles.  No pneumothorax.  Clear lungs. Loop recorder projects over the left side of the chest. IMPRESSION: No active cardiopulmonary disease. Electronically Signed   By: Jeronimo Greaves M.D.   On: 03/01/2023 10:20    US Abdomen Limited RUQ (LIVER/GB)  Result Date: 03/03/2023 CLINICAL DATA:  Right upper quadrant abdominal pain for several days. EXAM: ULTRASOUND ABDOMEN LIMITED RIGHT UPPER QUADRANT COMPARISON:  September 02, 2017. FINDINGS: Gallbladder: No gallstones or wall thickening visualized. No sonographic Murphy sign noted by sonographer. Common bile duct:  Diameter: 4 mm which is within normal limits. Liver: No focal lesion identified. Within normal limits in parenchymal echogenicity. Portal vein is patent on color Doppler imaging with normal direction of blood flow towards the liver. Other: None. Electronically Signed   By: Lupita Raider M.D.   On: 03/03/2023 15:23      Additional Info: This encounter employed real-time, collaborative documentation. The patient actively reviewed and updated their medical record on a shared screen, ensuring transparency and facilitating joint problem-solving for the problem list, overview, and plan. This approach promotes accurate, informed care. The treatment plan was discussed and reviewed in detail, including medication safety, potential side effects, and all patient questions. We confirmed understanding and comfort with the plan. Follow-up instructions were established, including contacting the office for any concerns, returning if symptoms worsen, persist, or new symptoms develop, and precautions for potential emergency department visits.

## 2023-05-10 NOTE — Patient Instructions (Signed)
VISIT SUMMARY:  During today's visit, we discussed your ongoing pain management, recurrent urinary tract infections, new symptoms of leg numbness and weakness, and other health concerns. We reviewed your current medications and made several recommendations to address your symptoms and improve your overall health.  YOUR PLAN:  -OPIOID USE DISORDER: Opioid Use Disorder is a condition where there is a dependency on opioid medications. Your current dose of Suboxone is effective in preventing cravings, but it does not alleviate your pain. We will refill your Suboxone for 30 days and discuss a referral to a pain specialist for additional treatments.  -CHRONIC PAIN: Chronic Pain is long-lasting pain that persists beyond the usual recovery period. Despite using Suboxone and gabapentin, you are still experiencing pain along with new leg numbness and weakness. We will order an MRI of your lumbar spine, refer you to pain management for additional treatments, and consider physical therapy after reviewing the MRI results.  -RECURRENT URINARY TRACT INFECTIONS (UTIS): Recurrent UTIs are frequent infections of the urinary tract. You have been experiencing malodorous urine and have been prescribed a daily low-dose antibiotic, which you should pick up from the pharmacy. We will perform a urinalysis and encourage you to start the prescribed antibiotic.  -IRON DEFICIENCY ANEMIA: Iron Deficiency Anemia is a condition where there is a lack of iron in the body, leading to a drop in hemoglobin levels. This is likely due to chronic blood loss in your urine. Please continue taking your iron supplements and follow up with a urologist to address the underlying cause.  -ONYCHOMYCOSIS: Onychomycosis is a fungal infection of the toenail. You have a thickened toenail following trauma, which may indicate a fungal infection. We will prescribe ciclopirox cream and refer you to a foot specialist for further evaluation.  -GENERAL HEALTH  MAINTENANCE: We discussed routine health maintenance and medication refills. You received a Toradol injection for acute pain, and we provided a letter for a height-adjustable chair. We will refill your gabapentin and Aimovig.  INSTRUCTIONS:  Please schedule a follow-up appointment with your primary care provider. Follow up with a urologist in 5-6 weeks, a neurologist in early January, and schedule your next appointment with internal medicine in one month.

## 2023-05-10 NOTE — Assessment & Plan Note (Signed)
Chronic Pain   She reports persistent pain despite Suboxone and gabapentin use, with new leg numbness and weakness, suggesting sciatica or spinal issues. An MRI in July showed no compression, but symptoms have worsened. We will order an MRI of the lumbar spine, refer her to pain management for interventional treatments, and consider physical therapy after reviewing MRI results.

## 2023-05-10 NOTE — Assessment & Plan Note (Signed)
  Suboxone 8 mg twice daily is effective in preventing cravings but does not alleviate her pain. She prefers not to increase the Suboxone dose. We will refill Suboxone for 30 days and discuss a referral to a pain specialist for interventional treatments.

## 2023-05-11 ENCOUNTER — Other Ambulatory Visit: Payer: Self-pay | Admitting: Family Medicine

## 2023-05-11 DIAGNOSIS — M5431 Sciatica, right side: Secondary | ICD-10-CM

## 2023-05-11 MED ORDER — TIZANIDINE HCL 4 MG PO TABS
4.0000 mg | ORAL_TABLET | Freq: Four times a day (QID) | ORAL | 3 refills | Status: DC | PRN
Start: 2023-05-11 — End: 2023-08-13

## 2023-05-13 ENCOUNTER — Telehealth: Payer: Self-pay | Admitting: Family Medicine

## 2023-05-13 DIAGNOSIS — R109 Unspecified abdominal pain: Secondary | ICD-10-CM | POA: Diagnosis not present

## 2023-05-13 DIAGNOSIS — N3289 Other specified disorders of bladder: Secondary | ICD-10-CM | POA: Diagnosis not present

## 2023-05-13 DIAGNOSIS — N3000 Acute cystitis without hematuria: Secondary | ICD-10-CM | POA: Diagnosis not present

## 2023-05-13 DIAGNOSIS — R350 Frequency of micturition: Secondary | ICD-10-CM | POA: Diagnosis not present

## 2023-05-13 LAB — URINALYSIS W MICROSCOPIC + REFLEX CULTURE
Bilirubin Urine: NEGATIVE
Glucose, UA: NEGATIVE
Hyaline Cast: NONE SEEN /LPF
Ketones, ur: NEGATIVE
Nitrites, Initial: POSITIVE — AB
Protein, ur: NEGATIVE
Specific Gravity, Urine: 1.021 (ref 1.001–1.035)
pH: 5.5 (ref 5.0–8.0)

## 2023-05-13 LAB — URINE CULTURE
MICRO NUMBER:: 15799861
SPECIMEN QUALITY:: ADEQUATE

## 2023-05-13 LAB — CULTURE INDICATED

## 2023-05-13 NOTE — Telephone Encounter (Signed)
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 Caller is taking abx. The caller is having pain in vagina and back, Caller had a renal scope a week ago. Translation No Nurse Assessment Nurse: Vear Clock, RN, Elease Hashimoto Date/Time Lamount Cohen Time): 05/13/2023 3:15:32 AM Confirm and document reason for call. If symptomatic, describe symptoms. ---She has a UTI and she is on ABX. She is having pain in her vagina that started this morning. Her pain is a 6 . She is very uncomfortable. No fever Does the patient have any new or worsening symptoms? ---Yes Will a triage be completed? ---Yes Related visit to physician within the last 2 weeks? ---No Does the PT have any chronic conditions? (i.e. diabetes, asthma, this includes High risk factors for pregnancy, etc.) ---Yes List chronic conditions. ---HTN Is the patient pregnant or possibly pregnant? (Ask all females between the ages of 6-55) ---No Is this a behavioral health or substance abuse call? ---No Guidelines Guideline Title Affirmed Question Affirmed Notes Nurse Date/Time (Eastern Time) Urinary Tract Infection on Antibiotic Follow-up Call - Female [1] Unable to urinate (or only a few drops) > 4 hours AND [2] bladder feels very Emiliano Dyer 05/13/2023 3:17:28 AM Guidelines Guideline Title  Affirmed Question Affirmed Notes Nurse Date/Time (Eastern Time) full (e.g., palpable bladder or strong urge to urinate) Disp. Time Lamount Cohen Time) Disposition Final User 05/13/2023 3:14:18 AM Send To RN Personal Tomie China, RN, Lurena Joiner 05/13/2023 3:21:10 AM Go to ED Now Yes Vear Clock, RN, Elease Hashimoto Final Disposition 05/13/2023 3:21:10 AM Go to ED Now Yes Vear Clock, RN, Ancil Boozer Disagree/Comply Disagree Caller Understands Yes PreDisposition Did not know what to do Care Advice Given Per Guideline GO TO ED NOW: * You need to be seen in the Emergency Department. * Go to the ED at ___________ Hospital. * Leave now. Drive carefully. CARE ADVICE given per Urinary Tract Infection on Antibiotic Follow-up Call - Female (Adult) guideline. Referrals GO TO FACILITY REFUSED

## 2023-05-17 ENCOUNTER — Other Ambulatory Visit (HOSPITAL_BASED_OUTPATIENT_CLINIC_OR_DEPARTMENT_OTHER): Payer: Self-pay

## 2023-05-17 ENCOUNTER — Other Ambulatory Visit (HOSPITAL_COMMUNITY): Payer: Self-pay

## 2023-05-18 ENCOUNTER — Encounter: Payer: Self-pay | Admitting: Family Medicine

## 2023-05-18 ENCOUNTER — Ambulatory Visit: Payer: Medicaid Other | Admitting: Family Medicine

## 2023-05-18 VITALS — BP 115/82 | HR 87 | Temp 97.6°F | Resp 18 | Ht 59.0 in | Wt 142.4 lb

## 2023-05-18 DIAGNOSIS — G43909 Migraine, unspecified, not intractable, without status migrainosus: Secondary | ICD-10-CM

## 2023-05-18 DIAGNOSIS — I10 Essential (primary) hypertension: Secondary | ICD-10-CM | POA: Diagnosis not present

## 2023-05-18 DIAGNOSIS — J4530 Mild persistent asthma, uncomplicated: Secondary | ICD-10-CM

## 2023-05-18 DIAGNOSIS — M5432 Sciatica, left side: Secondary | ICD-10-CM | POA: Diagnosis not present

## 2023-05-18 DIAGNOSIS — B3731 Acute candidiasis of vulva and vagina: Secondary | ICD-10-CM

## 2023-05-18 DIAGNOSIS — K219 Gastro-esophageal reflux disease without esophagitis: Secondary | ICD-10-CM

## 2023-05-18 DIAGNOSIS — K920 Hematemesis: Secondary | ICD-10-CM

## 2023-05-18 DIAGNOSIS — R278 Other lack of coordination: Secondary | ICD-10-CM

## 2023-05-18 DIAGNOSIS — F419 Anxiety disorder, unspecified: Secondary | ICD-10-CM

## 2023-05-18 DIAGNOSIS — E611 Iron deficiency: Secondary | ICD-10-CM

## 2023-05-18 DIAGNOSIS — F902 Attention-deficit hyperactivity disorder, combined type: Secondary | ICD-10-CM

## 2023-05-18 MED ORDER — RIZATRIPTAN BENZOATE 10 MG PO TABS: 10.0000 mg | ORAL_TABLET | ORAL | 3 refills | Status: DC | PRN

## 2023-05-18 MED ORDER — AMLODIPINE BESYLATE 10 MG PO TABS: 10.0000 mg | ORAL_TABLET | Freq: Every day | ORAL | 3 refills | Status: DC

## 2023-05-18 MED ORDER — LOSARTAN POTASSIUM 100 MG PO TABS
100.0000 mg | ORAL_TABLET | Freq: Every day | ORAL | 1 refills | Status: DC
Start: 2023-05-18 — End: 2023-08-23

## 2023-05-18 MED ORDER — PANTOPRAZOLE SODIUM 40 MG PO TBEC: 40.0000 mg | DELAYED_RELEASE_TABLET | Freq: Two times a day (BID) | ORAL | 1 refills | Status: DC

## 2023-05-18 MED ORDER — GABAPENTIN 800 MG PO TABS: 800.0000 mg | ORAL_TABLET | Freq: Three times a day (TID) | ORAL | 1 refills | Status: DC

## 2023-05-18 MED ORDER — FLUCONAZOLE 150 MG PO TABS: 150.0000 mg | ORAL_TABLET | ORAL | 0 refills | Status: DC | PRN

## 2023-05-18 MED ORDER — MOMETASONE FURO-FORMOTEROL FUM 100-5 MCG/ACT IN AERO: 2.0000 | INHALATION_SPRAY | Freq: Two times a day (BID) | RESPIRATORY_TRACT | 2 refills | Status: DC

## 2023-05-18 MED ORDER — FLUTICASONE PROPIONATE 50 MCG/ACT NA SUSP: 1.0000 | Freq: Every day | NASAL | 3 refills | Status: AC

## 2023-05-18 NOTE — Assessment & Plan Note (Signed)
Chronic.  Lately, not controlled.  Symbicort and Advair cause dry mouth.  Bevespi seemed to help in the past, insurance not covering.  Using albuterol inhaler.  Patient still vapes.  Advised to stop.  Will trial Dulera.

## 2023-05-18 NOTE — Progress Notes (Signed)
Subjective:     Patient ID: Laurie Roman, female    DOB: [birth year], 47 y.o.   MRN: [id number]  Chief Complaint  Patient presents with   Follow-up    ED Follow-up, medication refills    HPI On 12/5-[place] ED for cystitis. Neg CT for stones or pyelo.  Given cipro(was on cefdinir).  Got phenergan in ER and was "out of it" which usu doesn't.  Was dehydrated as well.  Doing bettter now.  Still urge to urinate.  Will see urol mid Jan.   Anxiety-worse as may be xferred to another lab.  Hasn't taken abilify yet.  Was told to stop Lexapro due to Suboxone and it was not helping anyway.  ADHD-taking adderall daily, occ bid.  Sometimes skips on weekends.  Helps focus at work and home.  Less errors if takes it.  No SI.  No SE.  Migraine-Aimovig helps.  Maxalt 3-4x/month.   Anemia-not taking iron as too constipated despite stool softeners  Asthma-symbicort-dry mouth.  Bevesbi not covered.  Advair-dry mouth.  Coughing more at times and harder to breathe at times  Constipation(most likely from suboxone)-not taking linzess yet-concerned w/diarrhea  HTN-Pt is on amlodipine 10, losartan 100  .  Bp's running  "good".  No ha/dizziness/cp/palp/edema/cough/sob-related to bp  Palp-taking metoprol prn and not needing  GERD-protonix 40mg .  Some increased reflux now-more at hs.  Vomited once and "black"  occ blood in stools(constipation)  Some confusion at times-hard to hold things at times and drops things a lot, then ok.  Has occurred 3-4 times.  Can last up to 1 hr.  Last episode yesterday.  More on R and pt R handed.  More HA lately as well and different than migraine.  Some blurry vision at times.    Health Maintenance Due  Topic Date Due   Colonoscopy  Never done    Past Medical History:  Diagnosis Date   ADD (attention deficit disorder)    Anxiety    Asthma    Blood transfusion without reported diagnosis    Depression    GERD (gastroesophageal reflux disease)    IBS (irritable  bowel syndrome)    Insomnia    Lumbago    Migraine    Opioid dependence (HCC) 08/21/2022   Explained 08/21/22 that 4 months hydrocodone for pain management for delays in dental care has resulted in hyperalgesia and dependence   Other chest pain 08/17/2022   Normal EKG and D-dimer  Associated with lifting on father Doesn't radiate  Nonexertional  Not substernal chest pain more to the side, feels related to sleeping on it wrong (reproducible)   Sciatica    Sleep apnea    Tietze's disease    Urinary incontinence 10/26/2022    Past Surgical History:  Procedure Laterality Date   APPENDECTOMY     CESAREAN SECTION     TONSILLECTOMY     TOTAL ABDOMINAL HYSTERECTOMY W/ BILATERAL SALPINGOOPHORECTOMY     TUBAL LIGATION       Current Outpatient Medications:    albuterol (VENTOLIN HFA) 108 (90 Base) MCG/ACT inhaler, Inhale 2 puffs into the lungs every 6 (six) hours as needed for wheezing or shortness of breath., Disp: 18 g, Rfl: 3   amphetamine-dextroamphetamine (ADDERALL) 20 MG tablet, Take 1 tablet (20 mg total) by mouth 2 (two) times daily., Disp: 60 tablet, Rfl: 0   ARIPiprazole (ABILIFY) 2 MG tablet, Take 1 tablet (2 mg total) by mouth daily as needed., Disp: , Rfl:  Buprenorphine HCl-Naloxone HCl (SUBOXONE) 8-2 MG FILM, Place 2 Film under the tongue daily at 6 (six) AM. Fill when due, Disp: 60 each, Rfl: 0   chlorhexidine (PERIDEX) 0.12 % solution, Use as directed 15 mLs in the mouth or throat 2 (two) times daily., Disp: 1893 mL, Rfl: 0   ciclopirox (LOPROX) 0.77 % cream, Apply topically 2 (two) times daily., Disp: 90 g, Rfl: 3   ciprofloxacin (CIPRO) 500 MG tablet, Take by mouth., Disp: , Rfl:    Cyanocobalamin (B-12) 1000 MCG SUBL, Place 1 tablet under the tongue daily at 6 (six) AM., Disp: 100 tablet, Rfl: 3   Erenumab-aooe (AIMOVIG) 140 MG/ML SOAJ, Inject 140 mg into the skin every 30 (thirty) days., Disp: 1 mL, Rfl: 1   ketorolac (TORADOL) 10 MG tablet, Take 1 tablet (10 mg total) by  mouth every 6 (six) hours as needed., Disp: 20 tablet, Rfl: 0   linaclotide (LINZESS) 145 MCG CAPS capsule, Take 1 capsule (145 mcg total) by mouth daily before breakfast., Disp: 90 capsule, Rfl: 3   metoprolol tartrate (LOPRESSOR) 25 MG tablet, Take 1 tablet (25 mg total) by mouth 2 (two) times daily., Disp: 180 tablet, Rfl: 0   mometasone-formoterol (DULERA) 100-5 MCG/ACT AERO, Inhale 2 puffs into the lungs 2 (two) times daily., Disp: 13 g, Rfl: 2   nystatin (MYCOSTATIN) 100000 UNIT/ML suspension, Take 5 mLs (500,000 Units total) by mouth 4 (four) times daily. Swish and hold in mouth few seconds and swallow, Disp: 60 mL, Rfl: 0   nystatin (MYCOSTATIN/NYSTOP) powder, Apply 1 Application topically 3 (three) times daily., Disp: 60 g, Rfl: 11   nystatin cream (MYCOSTATIN), Apply 1 Application topically 2 (two) times daily., Disp: 30 g, Rfl: 5   ondansetron (ZOFRAN) 4 MG tablet, Take 1 tablet (4 mg total) by mouth every 8 (eight) hours as needed for nausea or vomiting., Disp: 20 tablet, Rfl: 0   phenazopyridine (PYRIDIUM) 200 MG tablet, Take 200 mg by mouth 3 (three) times daily., Disp: , Rfl:    potassium chloride (KLOR-CON M) 20 MEQ tablet, Take 1 tablet (20 mEq total) by mouth daily., Disp: 90 tablet, Rfl: 0   Probiotic Product (PROBIOTIC DAILY) CAPS, Take 1 capsule by mouth daily., Disp: 30 capsule, Rfl: 2   tiZANidine (ZANAFLEX) 4 MG tablet, Take 1 tablet (4 mg total) by mouth every 6 (six) hours as needed for muscle spasms., Disp: 30 tablet, Rfl: 3   trimethoprim (TRIMPEX) 100 MG tablet, Take 100 mg by mouth daily., Disp: , Rfl:    amLODipine (NORVASC) 10 MG tablet, Take 1 tablet (10 mg total) by mouth daily. Replaces amlodipine 5, for uncontrolled blood pressure., Disp: 90 tablet, Rfl: 3   fluconazole (DIFLUCAN) 150 MG tablet, Take 1 tablet (150 mg total) by mouth every three (3) days as needed., Disp: 2 tablet, Rfl: 0   fluticasone (FLONASE) 50 MCG/ACT nasal spray, Place 1 spray into both  nostrils daily., Disp: 48 g, Rfl: 3   gabapentin (NEURONTIN) 800 MG tablet, Take 1 tablet (800 mg total) by mouth 3 (three) times daily. Replaces prior dosing schedule., Disp: 270 tablet, Rfl: 1   losartan (COZAAR) 100 MG tablet, Take 1 tablet (100 mg total) by mouth daily., Disp: 90 tablet, Rfl: 1   pantoprazole (PROTONIX) 40 MG tablet, Take 1 tablet (40 mg total) by mouth 2 (two) times daily before a meal., Disp: 180 tablet, Rfl: 1   rizatriptan (MAXALT) 10 MG tablet, Take 1 tablet (10 mg total) by mouth as  needed for migraine. May repeat in 2 hours if needed, Disp: 10 tablet, Rfl: 3  Current Facility-Administered Medications:    ondansetron (ZOFRAN-ODT) disintegrating tablet 4 mg, 4 mg, Oral, Once, [name], MD  Allergies  Allergen Reactions   Cefazolin Hives    Only IV formulation can safely take by mouth keflex   Other Hives    Odansetron injection.   Sulfa Antibiotics Rash   Tramadol Hcl     Other reaction(s): chest pain   Emgality [Galcanezumab-Gnlm]    Ultram [Tramadol] Other (See Comments)    Tachycardia   Nitrofurantoin Itching    Red welts.   Oxycodone Rash   Sulfasalazine Rash   ROS neg/noncontributory except as noted HPI/below      Objective:     BP 115/82   Pulse 87   Temp 97.6 F (36.4 C) (Temporal)   Resp 18   Ht 4\' 11"  (1.499 m)   Wt 142 lb 6 oz (64.6 kg)   SpO2 98%   BMI 28.76 kg/m  Wt Readings from Last 3 Encounters:  05/18/23 142 lb 6 oz (64.6 kg)  05/10/23 141 lb (64 kg)  04/21/23 141 lb 9.6 oz (64.2 kg)    Physical Exam   Gen: WDWN NAD HEENT: NCAT, conjunctiva not injected, sclera nonicteric NECK:  supple, no thyromegaly, no nodes, no carotid bruits CARDIAC: RRR, S1S2+, no murmur. DP 2+B LUNGS: CTAB. No wheezes ABDOMEN:  BS+, soft, NTND, No HSM, no masses EXT:  no edema MSK: no gross abnormalities.  NEURO: A&O x3.  CN II-XII intact.  PSYCH: normal mood. Good eye contact  Reviewed ER records.   45 min w/pt reviewing records,  updating history. plan    Assessment & Plan:  Essential hypertension Assessment & Plan: Chronic.   well-controlled on amlodipine 10 mg, losartan 100 mg, occasionally gets flares of uncontrolled hypertension.  Frequently, related to increased anxiety  Orders: -     amLODIPine Besylate; Take 1 tablet (10 mg total) by mouth daily. Replaces amlodipine 5, for uncontrolled blood pressure.  Dispense: 90 tablet; Refill: 3 -     Losartan Potassium; Take 1 tablet (100 mg total) by mouth daily.  Dispense: 90 tablet; Refill: 1  Gastroesophageal reflux disease without esophagitis Assessment & Plan: Chronic.  Currently, not well-controlled.  Increase Protonix to 40 mg twice daily.  Refer to GI as she has some anemia, blood in stools, hematemesis  Orders: -     Pantoprazole Sodium; Take 1 tablet (40 mg total) by mouth 2 (two) times daily before a meal.  Dispense: 180 tablet; Refill: 1  Migraine without status migrainosus, not intractable, unspecified migraine type Assessment & Plan: Chronic.  Mostly controlled on Aimovig.  Still getting 3-4 breakthrough migraines per month.  Maxalt 10 mg resolves.  Continue.  She is getting different types of headaches-with additional symptoms.  Will check MRI  Orders: -     Rizatriptan Benzoate; Take 1 tablet (10 mg total) by mouth as needed for migraine. May repeat in 2 hours if needed  Dispense: 10 tablet; Refill: 3  Mild persistent asthma without complication Assessment & Plan: Chronic.  Lately, not controlled.  Symbicort and Advair cause dry mouth.  Bevespi seemed to help in the past, insurance not covering.  Using albuterol inhaler.  Patient still vapes.  Advised to stop.  Will trial Dulera.  Orders: -     Mometasone Furo-Formoterol Fum; Inhale 2 puffs into the lungs 2 (two) times daily.  Dispense: 13 g; Refill: 2  Left sided sciatica Assessment & Plan: Chronic.  On gabapentin 800mg  three times daily, zanaflex 4 mg at night-time, NSAIDs, and suboxone    getting new symptoms and MRI has been ordered   Orders: -     Gabapentin; Take 1 tablet (800 mg total) by mouth 3 (three) times daily. Replaces prior dosing schedule.  Dispense: 270 tablet; Refill: 1  Vaginal candidiasis -     Fluconazole; Take 1 tablet (150 mg total) by mouth every three (3) days as needed.  Dispense: 2 tablet; Refill: 0  Attention deficit hyperactivity disorder (ADHD), combined type Assessment & Plan: Chronic.  Controlled on Adderall 20 mg twice daily as needed.     Anxiety Assessment & Plan: Chronic.  Not ideally controlled.  Will need to touch base with Dr. Jon Billings regarding medications that she can take with Suboxone   Iron deficiency  Coordination impairment  Hematemesis without nausea  Other orders -     Fluticasone Propionate; Place 1 spray into both nostrils daily.  Dispense: 48 g; Refill: 3  Refer GI,  d/t hematemesis, uncontrolled GERD, anemia .  Discuss meds for anx.  HA/coordination problems-?moods, mass in brain as different HA's.  Check MRI brain   Return in about 4 weeks (around 06/15/2023) for chronic follow-up.  Angelena Sole, MD

## 2023-05-18 NOTE — Patient Instructions (Addendum)
It was very nice to see you today!  Happy Holidays  Increase protonix to twice daily.   PLEASE NOTE:  If you had any lab tests please let us know if you have not heard back within a few days. You may see your results on MyChart before we have a chance to review them but we will give you a call once they are reviewed by [place]. If we ordered any referrals today, please let us know if you have not heard from their office within the next week.   Please try these tips to maintain a healthy lifestyle:  Eat most of your calories during the day when you are active. Eliminate processed foods including packaged sweets (pies, cakes, cookies), reduce intake of potatoes, white bread, white pasta, and white rice. Look for whole grain options, oat flour or almond flour.  Each meal should contain half fruits/vegetables, one quarter protein, and one quarter carbs (no bigger than a computer mouse).  Cut down on sweet beverages. This includes juice, soda, and sweet tea. Also watch fruit intake, though this is a healthier sweet option, it still contains natural sugar! Limit to 3 servings daily.  Drink at least 1 glass of water with each meal and aim for at least 8 glasses per day  Exercise at least 150 minutes every week.

## 2023-05-18 NOTE — Assessment & Plan Note (Signed)
Chronic.   well-controlled on amlodipine 10 mg, losartan 100 mg, occasionally gets flares of uncontrolled hypertension.  Frequently, related to increased anxiety

## 2023-05-18 NOTE — Assessment & Plan Note (Signed)
Chronic.  Controlled on Adderall 20 mg twice daily as needed.

## 2023-05-18 NOTE — Assessment & Plan Note (Signed)
Chronic.  Mostly controlled on Aimovig.  Still getting 3-4 breakthrough migraines per month.  Maxalt 10 mg resolves.  Continue.  She is getting different types of headaches-with additional symptoms.  Will check MRI

## 2023-05-18 NOTE — Assessment & Plan Note (Signed)
Chronic.  Not ideally controlled.  Will need to touch base with Dr. Jon Billings regarding medications that she can take with Suboxone

## 2023-05-18 NOTE — Assessment & Plan Note (Signed)
Chronic.  On gabapentin 800mg  three times daily, zanaflex 4 mg at night-time, NSAIDs, and suboxone   getting new symptoms and MRI has been ordered

## 2023-05-18 NOTE — Assessment & Plan Note (Signed)
Chronic.  Currently, not well-controlled.  Increase Protonix to 40 mg twice daily.  Refer to GI as she has some anemia, blood in stools, hematemesis

## 2023-05-19 ENCOUNTER — Other Ambulatory Visit: Payer: Self-pay

## 2023-05-19 ENCOUNTER — Other Ambulatory Visit (HOSPITAL_BASED_OUTPATIENT_CLINIC_OR_DEPARTMENT_OTHER): Payer: Self-pay

## 2023-05-20 ENCOUNTER — Other Ambulatory Visit (HOSPITAL_BASED_OUTPATIENT_CLINIC_OR_DEPARTMENT_OTHER): Payer: Self-pay

## 2023-05-21 ENCOUNTER — Other Ambulatory Visit (HOSPITAL_BASED_OUTPATIENT_CLINIC_OR_DEPARTMENT_OTHER): Payer: Self-pay

## 2023-05-25 ENCOUNTER — Encounter: Payer: Self-pay | Admitting: Family Medicine

## 2023-05-25 ENCOUNTER — Other Ambulatory Visit: Payer: Self-pay | Admitting: Family Medicine

## 2023-05-25 MED ORDER — GABAPENTIN 600 MG PO TABS: 600.0000 mg | ORAL_TABLET | Freq: Three times a day (TID) | ORAL | 0 refills | Status: DC

## 2023-05-27 ENCOUNTER — Encounter: Payer: Self-pay | Admitting: Internal Medicine

## 2023-05-27 ENCOUNTER — Telehealth: Payer: Medicaid Other | Admitting: Internal Medicine

## 2023-05-27 DIAGNOSIS — Z566 Other physical and mental strain related to work: Secondary | ICD-10-CM

## 2023-05-27 DIAGNOSIS — F322 Major depressive disorder, single episode, severe without psychotic features: Secondary | ICD-10-CM | POA: Diagnosis not present

## 2023-05-27 DIAGNOSIS — F419 Anxiety disorder, unspecified: Secondary | ICD-10-CM | POA: Diagnosis not present

## 2023-05-27 DIAGNOSIS — F4329 Adjustment disorder with other symptoms: Secondary | ICD-10-CM | POA: Insufficient documentation

## 2023-05-27 DIAGNOSIS — I10 Essential (primary) hypertension: Secondary | ICD-10-CM | POA: Diagnosis not present

## 2023-05-27 MED ORDER — ARIPIPRAZOLE 2 MG PO TABS: 2.0000 mg | ORAL_TABLET | Freq: Every day | ORAL | 3 refills | Status: DC | PRN

## 2023-05-27 NOTE — Assessment & Plan Note (Signed)
Associated with grief reaction.

## 2023-05-27 NOTE — Patient Instructions (Addendum)
VISIT SUMMARY:  During today's visit, we discussed your ongoing anxiety and high blood pressure, which have been particularly challenging due to the upcoming anniversary of your spouse's death and stress at work. We also reviewed your concerns about medication interactions and your current management strategies.  YOUR PLAN:  -ANXIETY AND GRIEF: Anxiety and grief are emotional responses that can cause physical symptoms like dizziness and high blood pressure. We recommend starting Abilify at a low dose once daily when you do not need to drive. Additionally, consider therapy to help manage your grief and anxiety. We will document the severity of your condition to support the use of Abilify.  -HYPERTENSION: Hypertension, or high blood pressure, can be worsened by anxiety. It is important to take your antihypertensive medication as prescribed. We will closely monitor your blood pressure, especially when your anxiety is high.  -WORK-RELATED STRESS: Work-related stress can contribute to anxiety and high blood pressure. We will provide documentation for your employer about your medical condition and the need for time off work. We will also explore therapy options that fit your work schedule.  -FAMILY MEDICAL LEAVE ACT (FMLA): You are currently on FMLA leave, which requires documentation of your medical condition and absences. We will provide the necessary documentation for your employer and explore therapy options that accommodate your work schedule.  INSTRUCTIONS:  Please start taking Abilify at a low dose once daily when you do not need to drive. Make sure to take your antihypertensive medication as prescribed and monitor your blood pressure regularly. We will provide the necessary documentation for your employer regarding your medical condition and the need for time off work. Consider starting therapy to help manage your anxiety and  grief.    ======================================================    Under FMLA regulations, if an employee already has an approved FMLA certification that covers their specific medical condition and absences, they typically do not need to provide additional medical excuses for each absence that falls under that approved FMLA leave. However, the employer can require the employee to follow their normal call-in procedures to report absences and indicate that the absence is FMLA-related. This helps employers track FMLA usage against the employee's 12-week entitlement.  There are a few situations where additional documentation might be required: If the absence pattern differs significantly from what was certified If the employer has reason to doubt the continuing validity of the certification If the employer is requesting recertification (which is generally allowed every 6 months)  If your patient's absences fall within the frequency and duration specified in their FMLA certification, they should not need to provide additional medical documentation for each absence. The original FMLA certification serves as the medical justification for these absences. Would you like me to clarify anything specific about FMLA documentation requirements?

## 2023-05-27 NOTE — Assessment & Plan Note (Signed)
Hypertension: Elevated blood pressure likely secondary to high anxiety. The patient has been non-compliant with her antihypertensive medication due to concerns about side effects. -Encourage the patient to take her antihypertensive medication as prescribed. -Monitor blood pressure closely, particularly in the context of high anxiety.

## 2023-05-27 NOTE — Assessment & Plan Note (Addendum)
Triggered by holidays reflection on lost of loved one... PTSD type reaction with flashbacks. Anxiety and Grief: Severe anxiety and grief related to the death of her husband and the impact on her family. The anxiety is causing physical symptoms such as dizziness and high blood pressure. The patient is hesitant to start Abilify due to previous negative experiences with medications. -Start Abilify at a low dose, once daily, when it is safe for the patient not to drive. -Consider therapy for grief and anxiety management. -Document the severity of the patient's condition to justify the use of Abilify.

## 2023-05-27 NOTE — Addendum Note (Signed)
Addended by: [name] on: 05/27/2023 08:10 PM   Modules accepted: Level of Service

## 2023-05-27 NOTE — Assessment & Plan Note (Signed)
Severe, can't stop crying. Doubt she will need about.

## 2023-05-27 NOTE — Progress Notes (Signed)
[facility]: [phone]   Virtual Medical Office Visit - Video Telemedicine   Patient:  Laurie Roman ([birth year])  MRN:   [id number]      Date:   05/27/2023  Today's Healthcare Provider: [name], MD   Assessment & Plan  Assessment & Plan   Main reason for visit: Anxiety and bereavement  Assessment & Plan Grief reaction with prolonged bereavement Triggered by holidays reflection on lost of loved one... PTSD type reaction with flashbacks. Anxiety and Grief: Severe anxiety and grief related to the death of her husband and the impact on her family. The anxiety is causing physical symptoms such as dizziness and high blood pressure. The patient is hesitant to start Abilify due to previous negative experiences with medications. -Start Abilify at a low dose, once daily, when it is safe for the patient not to drive. -Consider therapy for grief and anxiety management. -Document the severity of the patient's condition to justify the use of Abilify. Anxiety Severe, can't stop crying. Doubt she will need about. Depression, major, single episode, severe (HCC) Associated with grief reaction.  Essential hypertension Hypertension: Elevated blood pressure likely secondary to high anxiety. The patient has been non-compliant with her antihypertensive medication due to concerns about side effects. -Encourage the patient to take her antihypertensive medication as prescribed. -Monitor blood pressure closely, particularly in the context of high anxiety. 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 This is likely contributing to her anxiety and hypertension. -Provide documentation for the patient's employer regarding her medical condition and the need for time off work. -Explore options for therapy that could accommodate the patient's work schedule.  Family Medical  Leave Act [facility]): The patient is currently on FMLA leave, which requires documentation of her medical condition and absences. -Provide documentation for the patient's employer regarding her medical condition and the need for time off work. -Explore options for therapy that could accommodate the patient's work schedule.   Grief reaction with prolonged bereavement Assessment & Plan: Triggered by holidays reflection on lost of loved one... PTSD type reaction with flashbacks.  Orders: -     ARIPiprazole; Take 1 tablet (2 mg total) by mouth daily as needed.  Dispense: 90 tablet; Refill: 3 -     Ambulatory referral to Psychology  Anxiety Assessment & Plan: Severe, can't stop crying. Doubt she will need about.  Orders: -     ARIPiprazole; Take 1 tablet (2 mg total) by mouth daily as needed.  Dispense: 90 tablet; Refill: 3 -     Ambulatory referral to Psychology  Depression, major, single episode, severe (HCC) Assessment & Plan: Associated with grief reaction.   Orders: -     ARIPiprazole; Take 1 tablet (2 mg total) by mouth daily as needed.  Dispense: 90 tablet; Refill: 3 -     Ambulatory referral to Psychology  Essential hypertension  Work-related stress  Treatment plan discussed and reviewed in detail. Explained medication safety and potential side effects.  Answered all patient questions and confirmed understanding and comfort with the plan. Encouraged patient to contact our office if they have any questions or concerns.  Agreed on patient coming for a sooner office visit if symptoms worsen, persist, or new symptoms develop. Discussed precautions in case of needing to visit the Emergency Department.  Future Appointments  Date Time Provider Department Center  06/08/2023  5:10 PM [facility] MR 1 [id number]  06/11/2023  1:00 PM Jeani Sow, MD [facility]        Subjective:   Chief Complaint / Reason for Visit:  Anxiety and bereavement   47 y.o. female  has a past  medical history of ADD (attention deficit disorder), Anxiety, Asthma, Blood transfusion without reported diagnosis, Depression, GERD (gastroesophageal reflux disease), IBS (irritable bowel syndrome), Insomnia, Lumbago, Migraine, Opioid dependence (HCC) (08/21/2022), Other chest pain (08/17/2022), Sciatica, Sleep apnea, Tietze's disease, and Urinary incontinence (10/26/2022).   Discussed the use of AI scribe software for clinical note transcription with the patient, who gave verbal consent to proceed.  History of Present Illness The patient, currently on family medical leave, presents with significant anxiety and high blood pressure, which have been ongoing issues for the past six to eight months. The patient's anxiety is particularly heightened due to the approaching anniversary of her spouse's death, a traumatic event for which she has not received any formal trauma treatment. The patient's anxiety is further exacerbated by stress at work, specifically due to a IT consultant.  The patient reports that her anxiety is causing physical symptoms, including elevated blood pressure and episodes of dizziness. She has been prescribed Abilify, but has been hesitant to take it due to concerns about potential interactions with her current medication, Suboxone. The patient also reports experiencing nightmares and difficulty sleeping, which she attributes to her grief and anxiety.  In addition to her mental health concerns, the patient has a history of high blood pressure. She has stopped taking her prescribed beta blocker due to concerns about it lowering her blood pressure too quickly. She manages her blood pressure by laying down and elevating her feet when it becomes too high.  The patient's anxiety and high blood pressure are causing significant distress and impacting her ability to work. She expresses a desire for therapy and a plan to manage her symptoms more effectively.  Past Medical History - High blood  pressure - Anxiety - Urinary tract infection  Reviewed chart data: has Migraine; ADHD; Essential hypertension; Mild obstructive sleep apnea; Controlled substance agreement signed; Gastroesophageal reflux disease without esophagitis; Left sided sciatica; Asthma; Maxillary sinusitis; Anxiety; Primary insomnia; Depression, major, single episode, severe (HCC); Cervicalgia; Chronic dental pain; Opioid dependence (HCC); B12 deficiency; Urinary incontinence; Flank pain; Uric acid crystalluria; Right sided sciatica; Intertrigo; Chronic bilateral low back pain with sciatica; Chronic right hip pain; Tachycardia; Drug-induced constipation; and Grief reaction with prolonged bereavement on their problem list. ARIPiprazole, B-12, Buprenorphine HCl-Naloxone HCl, Erenumab-aooe, Probiotic Daily, albuterol, amLODipine, amphetamine-dextroamphetamine, chlorhexidine, ciclopirox, fluconazole, fluticasone, gabapentin, ketorolac, linaclotide, losartan, metoprolol tartrate, mometasone-formoterol, nystatin, nystatin cream, ondansetron, pantoprazole, phenazopyridine, potassium chloride SA, rizatriptan, tiZANidine, and trimethoprim       Objective:  Physical Exam         Tearful, anxious  General Appearance:  Well Developed, Well Nourished, No Acute Distress by Limited Video Assessment Neurological:  Awake, Alert. No Obvious Focal Neurological Deficits or Cognitive Impairments.  Sensorium Seems Unclouded.   Results Reviewed: Last CBC Lab Results  Component Value Date   WBC 12.9 (H) 04/19/2023   HGB 10.7 (L) 04/19/2023   HCT 32.4 (L) 04/19/2023   MCV 92.3 04/19/2023   MCH 30.5 04/19/2023   RDW 12.5 04/19/2023   PLT 290 04/19/2023   Last metabolic panel Lab Results  Component Value Date   GLUCOSE 76 04/19/2023   NA 139 04/19/2023   K 4.7 04/19/2023   CL 103 04/19/2023   CO2 23 04/19/2023   BUN 20 04/19/2023  CREATININE 0.64 04/19/2023   GFR 109.19 02/22/2023   CALCIUM 9.2 04/19/2023   PROT 7.0  04/19/2023   ALBUMIN 4.8 02/22/2023   LABGLOB 2.6 08/11/2018   AGRATIO 1.8 08/11/2018   BILITOT 0.2 04/19/2023   ALKPHOS 72 02/22/2023   AST 16 04/19/2023   ALT 12 04/19/2023   ANIONGAP 11 02/16/2023   Last lipids Lab Results  Component Value Date   CHOL 255 (H) 01/25/2023   HDL 81.50 01/25/2023   LDLCALC 139 (H) 04/10/2022   LDLDIRECT 160.0 01/25/2023   TRIG 258.0 (H) 01/25/2023   CHOLHDL 3 01/25/2023   Last hemoglobin A1c Lab Results  Component Value Date   HGBA1C 5.8 01/25/2023   Last thyroid functions Lab Results  Component Value Date   TSH 0.82 01/25/2023   Last vitamin D No results found for: "25OHVITD2", "25OHVITD3", "VD25OH" Last vitamin B12 and Folate Lab Results  Component Value Date   VITAMINB12 644 04/19/2023   FOLATE 5.0 (L) 04/19/2023            ------------------------------------------------------ Attestation:  Today's Healthcare Provider [name], MD was located at office at [facility] [address] [place] [address]. The patient was located at outside. Today's Telemedicine visit was conducted via Video for 44m 20s after consent for telemedicine was obtained:  Video connection was never lost All video encounter participant identities and locations confirmed visually and verbally.  Signed: [name], MD 05/27/2023 8:07 PM

## 2023-06-08 ENCOUNTER — Other Ambulatory Visit: Payer: Medicaid Other

## 2023-06-10 ENCOUNTER — Other Ambulatory Visit: Payer: Self-pay | Admitting: Family Medicine

## 2023-06-10 DIAGNOSIS — F902 Attention-deficit hyperactivity disorder, combined type: Secondary | ICD-10-CM

## 2023-06-10 MED ORDER — AMPHETAMINE-DEXTROAMPHETAMINE 20 MG PO TABS
20.0000 mg | ORAL_TABLET | Freq: Two times a day (BID) | ORAL | 0 refills | Status: DC
Start: 2023-06-10 — End: 2023-08-02

## 2023-06-11 ENCOUNTER — Ambulatory Visit: Payer: Medicaid Other | Admitting: Family Medicine

## 2023-06-11 ENCOUNTER — Encounter: Payer: Self-pay | Admitting: Family Medicine

## 2023-06-16 ENCOUNTER — Other Ambulatory Visit: Payer: Self-pay

## 2023-06-16 ENCOUNTER — Other Ambulatory Visit (HOSPITAL_COMMUNITY): Payer: Self-pay

## 2023-06-16 ENCOUNTER — Other Ambulatory Visit (HOSPITAL_BASED_OUTPATIENT_CLINIC_OR_DEPARTMENT_OTHER): Payer: Self-pay

## 2023-06-16 ENCOUNTER — Ambulatory Visit: Payer: Medicaid Other | Admitting: Internal Medicine

## 2023-06-16 ENCOUNTER — Encounter: Payer: Self-pay | Admitting: Internal Medicine

## 2023-06-16 ENCOUNTER — Ambulatory Visit (HOSPITAL_COMMUNITY): Admission: RE | Admit: 2023-06-16 | Payer: Medicaid Other | Source: Ambulatory Visit

## 2023-06-16 VITALS — BP 136/83 | HR 68 | Temp 98.1°F | Ht 59.0 in | Wt 139.0 lb

## 2023-06-16 DIAGNOSIS — F11288 Opioid dependence with other opioid-induced disorder: Secondary | ICD-10-CM

## 2023-06-16 DIAGNOSIS — M5432 Sciatica, left side: Secondary | ICD-10-CM

## 2023-06-16 DIAGNOSIS — S79911A Unspecified injury of right hip, initial encounter: Secondary | ICD-10-CM

## 2023-06-16 DIAGNOSIS — M5116 Intervertebral disc disorders with radiculopathy, lumbar region: Secondary | ICD-10-CM | POA: Diagnosis not present

## 2023-06-16 DIAGNOSIS — S3992XA Unspecified injury of lower back, initial encounter: Secondary | ICD-10-CM | POA: Diagnosis not present

## 2023-06-16 DIAGNOSIS — R2681 Unsteadiness on feet: Secondary | ICD-10-CM | POA: Diagnosis not present

## 2023-06-16 DIAGNOSIS — M5416 Radiculopathy, lumbar region: Secondary | ICD-10-CM

## 2023-06-16 DIAGNOSIS — R202 Paresthesia of skin: Secondary | ICD-10-CM

## 2023-06-16 MED ORDER — AMITRIPTYLINE HCL 25 MG PO TABS
25.0000 mg | ORAL_TABLET | Freq: Every evening | ORAL | 1 refills | Status: DC | PRN
  Filled 2023-06-16: qty 30, 30d supply, fill #0

## 2023-06-16 MED ORDER — LIDOCAINE 5 % EX OINT
1.0000 | TOPICAL_OINTMENT | CUTANEOUS | 0 refills | Status: DC | PRN
  Filled 2023-06-16: qty 35.44, 10d supply, fill #0

## 2023-06-16 MED ORDER — BUPRENORPHINE HCL-NALOXONE HCL 8-2 MG SL FILM
3.0000 | ORAL_FILM | Freq: Every day | SUBLINGUAL | 0 refills | Status: DC
  Filled 2023-06-16: qty 21, 7d supply, fill #0

## 2023-06-16 MED ORDER — GABAPENTIN 800 MG PO TABS
800.0000 mg | ORAL_TABLET | Freq: Four times a day (QID) | ORAL | 1 refills | Status: DC | PRN
  Filled 2023-06-16: qty 270, 68d supply, fill #0
  Filled 2023-07-29 – 2023-08-23 (×3): qty 270, 68d supply, fill #1

## 2023-06-16 NOTE — Patient Instructions (Signed)
 VISIT SUMMARY:  You came in today due to severe right hip pain following a twisting injury a few days ago. Despite taking your usual medications, the pain has been unmanageable and has disrupted your sleep and daily activities. We discussed potential causes and treatment options, including imaging and referrals to specialists.  YOUR PLAN:  -SEVERE RIGHT HIP AND BUTTOCK PAIN: This pain is likely due to a muscle strain or a possible tear in the hip area. We will order an MRI to check for any serious injuries and refer you to physical therapy and a pain clinic for further treatment. You will continue with your current medications, and we may adjust the dosage if needed. Lidocaine  patches are prescribed for pain relief, and we may consider amitriptyline  to help with sleep.  -CHRONIC PAIN MANAGEMENT: Your chronic pain is currently managed with Suboxone , which limits the use of other pain medications. We discussed alternative treatments like Lyrica and possible ketamine treatment at a pain clinic. We will continue with your current dose of Suboxone  and consider a temporary increase if necessary. Avoid other opioids due to your Suboxone  treatment.  -GENERAL HEALTH MAINTENANCE: We will keep monitoring your blood pressure and continue your current medications. Regular follow-up appointments are important to maintain your overall health.  INSTRUCTIONS:  Please follow up in 2-4 weeks to reassess your pain and the effectiveness of the treatment. We will review and discuss your MRI results at the next appointment. Additionally, we will coordinate with the pain clinic for an urgent referral and follow-up.

## 2023-06-16 NOTE — Progress Notes (Signed)
 ==============================  [facility]: [phone]   -- Medical Office Visit --  Patient: Laurie Roman      Age: 48 y.o.       Sex:  female  Date:   06/16/2023 Today's Healthcare Provider: Bernardino KANDICE Cone, MD  ==============================   CHIEF COMPLAINT: No chief complaint on file.       SUBJECTIVE: 48 y.o. female who has Migraine; ADHD; Essential hypertension; Mild obstructive sleep apnea; Controlled substance agreement signed; Gastroesophageal reflux disease without esophagitis; Left sided sciatica; Asthma; Maxillary sinusitis; Anxiety; Primary insomnia; Depression, major, single episode, severe (HCC); Cervicalgia; Chronic dental pain; Opioid dependence (HCC); B12 deficiency; Urinary incontinence; Flank pain; Uric acid crystalluria; Right sided sciatica; Intertrigo; Chronic bilateral low back pain with sciatica; Chronic right hip pain; Tachycardia; Drug-induced constipation; and Grief reaction with prolonged bereavement on their problem list.  History of Present Illness The patient, with a history of chronic pain managed with Suboxone , presents with severe right hip pain following a twisting injury approximately three to four days prior. The pain is unresponsive to the maximum dose of Suboxone , and over-the-counter analgesics such as Tylenol  and ibuprofen. The patient also reports taking gabapentin , but the pain has escalated beyond control.  The pain is localized to the right hip and buttock, with no radiation into the groin. The patient describes the pain as severe, exacerbated by pressure and lying on the affected side, and unrelieved by massage. The pain has disrupted sleep and caused significant discomfort, even leading to episodes of lightheadedness.  The patient reports increased strain on the hip due to being alone for the past few days. The pain radiates down the back of the leg, causing nocturnal discomfort and nausea. The  patient recalls a recent incident of slipping on ice, which may have contributed to the current pain.  The patient has been on Suboxone  for chronic pain management, but expresses concern about the inability to take other pain medications due to being on Suboxone . The patient also reports taking tizanidine  for muscle relaxation, but the dosage had to be increased due to the current pain.  The patient reports numbness in the affected leg, but denies significant weakness. There is no history of bone disease or fractures. The patient has been managing the pain with lidocaine  patches and has previously taken Toradol .  The patient has a history of back issues, and the current pain and numbness in the leg suggest a possible lumbar injury. The patient reports no extreme tenderness or weight-bearing limitation, but has been sitting more due to the pain. The patient also reports a fear of trying new medications, preferring to stick with known treatments.  The patient has been managing the pain with a combination of medications, including Suboxone , gabapentin , tizanidine , and over-the-counter analgesics. However, the current pain level has exceeded the relief provided by these medications. The patient expresses concern about the limitations of pain management options due to being on Suboxone .  Note that patient  has a past medical history of ADD (attention deficit disorder), Anxiety, Asthma, Blood transfusion without reported diagnosis, Depression, GERD (gastroesophageal reflux disease), IBS (irritable bowel syndrome), Insomnia, Lumbago, Migraine, Opioid dependence (HCC) (08/21/2022), Other chest pain (08/17/2022), Sciatica, Sleep apnea, Tietze's disease, and Urinary incontinence (10/26/2022).  Problem list overviews that were updated at today's visit:No problems updated.  Med reconciliation: Current Outpatient Medications on File Prior to Visit  Medication Sig   albuterol  (VENTOLIN  HFA) 108 (90 Base) MCG/ACT  inhaler Inhale 2 puffs into  the lungs every 6 (six) hours as needed for wheezing or shortness of breath.   amLODipine  (NORVASC ) 10 MG tablet Take 1 tablet (10 mg total) by mouth daily. Replaces amlodipine  5, for uncontrolled blood pressure.   amphetamine -dextroamphetamine  (ADDERALL) 20 MG tablet Take 1 tablet (20 mg total) by mouth 2 (two) times daily.   ARIPiprazole  (ABILIFY ) 2 MG tablet Take 1 tablet (2 mg total) by mouth daily as needed.   Buprenorphine  HCl-Naloxone  HCl (SUBOXONE ) 8-2 MG FILM Place 2 Film under the tongue daily at 6 (six) AM. Fill when due   chlorhexidine  (PERIDEX ) 0.12 % solution Use as directed 15 mLs in the mouth or throat 2 (two) times daily.   ciclopirox  (LOPROX ) 0.77 % cream Apply topically 2 (two) times daily.   Cyanocobalamin  (B-12) 1000 MCG SUBL Place 1 tablet under the tongue daily at 6 (six) AM.   Erenumab -aooe (AIMOVIG ) 140 MG/ML SOAJ Inject 140 mg into the skin every 30 (thirty) days.   fluconazole  (DIFLUCAN ) 150 MG tablet Take 1 tablet (150 mg total) by mouth every three (3) days as needed.   fluticasone  (FLONASE ) 50 MCG/ACT nasal spray Place 1 spray into both nostrils daily.   gabapentin  (NEURONTIN ) 600 MG tablet Take 1 tablet (600 mg total) by mouth 3 (three) times daily.   gabapentin  (NEURONTIN ) 800 MG tablet Take 1 tablet (800 mg total) by mouth 3 (three) times daily. Replaces prior dosing schedule.   ketorolac  (TORADOL ) 10 MG tablet Take 1 tablet (10 mg total) by mouth every 6 (six) hours as needed.   linaclotide  (LINZESS ) 145 MCG CAPS capsule Take 1 capsule (145 mcg total) by mouth daily before breakfast.   losartan  (COZAAR ) 100 MG tablet Take 1 tablet (100 mg total) by mouth daily.   metoprolol  tartrate (LOPRESSOR ) 25 MG tablet Take 1 tablet (25 mg total) by mouth 2 (two) times daily.   mometasone -formoterol  (DULERA ) 100-5 MCG/ACT AERO Inhale 2 puffs into the lungs 2 (two) times daily.   nystatin  (MYCOSTATIN ) 100000 UNIT/ML suspension Take 5 mLs (500,000  Units total) by mouth 4 (four) times daily. Swish and hold in mouth few seconds and swallow   nystatin  (MYCOSTATIN /NYSTOP ) powder Apply 1 Application topically 3 (three) times daily.   nystatin  cream (MYCOSTATIN ) Apply 1 Application topically 2 (two) times daily.   ondansetron  (ZOFRAN ) 4 MG tablet Take 1 tablet (4 mg total) by mouth every 8 (eight) hours as needed for nausea or vomiting.   pantoprazole  (PROTONIX ) 40 MG tablet Take 1 tablet (40 mg total) by mouth 2 (two) times daily before a meal.   phenazopyridine  (PYRIDIUM ) 200 MG tablet Take 200 mg by mouth 3 (three) times daily.   potassium chloride  (KLOR-CON  M) 20 MEQ tablet Take 1 tablet (20 mEq total) by mouth daily.   Probiotic Product (PROBIOTIC DAILY) CAPS Take 1 capsule by mouth daily.   rizatriptan  (MAXALT ) 10 MG tablet Take 1 tablet (10 mg total) by mouth as needed for migraine. May repeat in 2 hours if needed   tiZANidine  (ZANAFLEX ) 4 MG tablet Take 1 tablet (4 mg total) by mouth every 6 (six) hours as needed for muscle spasms.   trimethoprim  (TRIMPEX ) 100 MG tablet Take 100 mg by mouth daily.   Current Facility-Administered Medications on File Prior to Visit  Medication   ondansetron  (ZOFRAN -ODT) disintegrating tablet 4 mg  There are no discontinued medications.    Objective   Physical Exam Musculoskeletal:       Legs:       05/18/2023  4:21 PM 05/10/2023   12:43 PM 04/21/2023    4:53 PM  Vitals with BMI  Height 4' 11 4' 11   Weight 142 lbs 6 oz 141 lbs   BMI 28.74 28.46   Systolic 115 134 870  Diastolic 82 94 90  Pulse 87 97    Wt Readings from Last 10 Encounters:  05/18/23 142 lb 6 oz (64.6 kg)  05/10/23 141 lb (64 kg)  04/21/23 141 lb 9.6 oz (64.2 kg)  04/19/23 141 lb 9.6 oz (64.2 kg)  04/02/23 141 lb 3.2 oz (64 kg)  03/26/23 141 lb 9.6 oz (64.2 kg)  03/24/23 145 lb (65.8 kg)  03/10/23 143 lb (64.9 kg)  02/26/23 147 lb (66.7 kg)  02/22/23 144 lb (65.3 kg)   Vital signs reviewed.  Nursing notes  reviewed. Weight trend reviewed. Abnormalities and Problem-Specific physical exam findings:  MUSCULOSKELETAL: Positive straight leg raise test indicating lower back pain. Unable to lie flat without significant pain. Reports of numbness in the leg. NEUROLOGICAL: Reports of leg numbness.  General Appearance:  No acute distress appreciable.   Well-groomed, healthy-appearing female.  Well proportioned with no abnormal fat distribution.  Good muscle tone. Pulmonary:  Normal work of breathing at rest, no respiratory distress apparent.    Musculoskeletal: All extremities are intact.  Neurological:  Awake, alert, oriented, and engaged.  No obvious focal neurological deficits or cognitive impairments.  Sensorium seems unclouded.   Speech is clear and coherent with logical content. Psychiatric:  Appropriate mood, pleasant and cooperative demeanor, thoughtful and engaged during the exam    No results found for any visits on 06/16/23. Office Visit on 05/10/2023  Component Date Value   Color, Urine 05/10/2023 YELLOW    APPearance 05/10/2023 CLOUDY (A)    Specific Gravity, Urine 05/10/2023 1.021    pH 05/10/2023 5.5    Glucose, UA 05/10/2023 NEGATIVE    Bilirubin Urine 05/10/2023 NEGATIVE    Ketones, ur 05/10/2023 NEGATIVE    Hgb urine dipstick 05/10/2023 TRACE (A)    Protein, ur 05/10/2023 NEGATIVE    Nitrites, Initial 05/10/2023 POSITIVE (A)    Leukocyte Esterase 05/10/2023 TRACE (A)    WBC, UA 05/10/2023 6-10 (A)    RBC / HPF 05/10/2023 3-10 (A)    Squamous Epithelial / HPF 05/10/2023 10-20 (A)    Bacteria, UA 05/10/2023 MANY (A)    Hyaline Cast 05/10/2023 NONE SEEN    Yeast 05/10/2023 FEW (A)    Note 05/10/2023     Color, UA 05/10/2023 yellow    Clarity, UA 05/10/2023 cloudy    Glucose, UA 05/10/2023 Negative    Bilirubin, UA 05/10/2023 Negative    Ketones, UA 05/10/2023 Negative    Spec Grav, UA 05/10/2023 >=1.030 (A)    Blood, UA 05/10/2023 Negative    pH, UA 05/10/2023 5.5     Protein, UA 05/10/2023 Negative    Urobilinogen, UA 05/10/2023 0.2    Nitrite, UA 05/10/2023 Positive    Leukocytes, UA 05/10/2023 Negative    MICRO NUMBER: 05/10/2023 [id number]    SPECIMEN QUALITY: 05/10/2023 Adequate    Sample Source 05/10/2023 URINE    STATUS: 05/10/2023 FINAL    ISOLATE 1: 05/10/2023 Escherichia coli (A)    REFLEXIVE URINE CULTURE 05/10/2023    Office Visit on 04/19/2023  Component Date Value   MICRO NUMBER: 04/19/2023 [id number]    SPECIMEN QUALITY: 04/19/2023 Adequate    Sample Source 04/19/2023 NOT GIVEN    STATUS: 04/19/2023 FINAL    Result: 04/19/2023  Value:Mixed genital flora isolated. These superficial bacteria are not indicative of a urinary tract infection. No further organism identification is warranted on this specimen. If clinically indicated, recollect clean-catch, mid-stream urine and transfer  immediately to Urine Culture Transport Tube.    Color, UA 04/19/2023 yellow    Clarity, UA 04/19/2023 cloudy    Glucose, UA 04/19/2023 Negative    Bilirubin, UA 04/19/2023 Negative    Ketones, UA 04/19/2023 Negative    Spec Grav, UA 04/19/2023 >=1.030 (A)    Blood, UA 04/19/2023 Positive    pH, UA 04/19/2023 5.0    Protein, UA 04/19/2023 Positive (A)    Urobilinogen, UA 04/19/2023 0.2    Nitrite, UA 04/19/2023 Negative    Leukocytes, UA 04/19/2023 Moderate (2+) (A)    Iron  04/19/2023 24 (L)    TIBC 04/19/2023 326    %SAT 04/19/2023 7 (L)    Ferritin 04/19/2023 25    Glucose, Bld 04/19/2023 76    BUN 04/19/2023 20    Creat 04/19/2023 0.64    BUN/Creatinine Ratio 04/19/2023 SEE NOTE:    Sodium 04/19/2023 139    Potassium 04/19/2023 4.7    Chloride 04/19/2023 103    CO2 04/19/2023 23    Calcium 04/19/2023 9.2    Total Protein 04/19/2023 7.0    Albumin 04/19/2023 4.4    Globulin 04/19/2023 2.6    AG Ratio 04/19/2023 1.7    Total Bilirubin 04/19/2023 0.2    Alkaline phosphatase (AP* 04/19/2023 77    AST 04/19/2023 16    ALT  04/19/2023 12    WBC 04/19/2023 12.9 (H)    RBC 04/19/2023 3.51 (L)    Hemoglobin 04/19/2023 10.7 (L)    HCT 04/19/2023 32.4 (L)    MCV 04/19/2023 92.3    MCH 04/19/2023 30.5    MCHC 04/19/2023 33.0    RDW 04/19/2023 12.5    Platelets 04/19/2023 290    MPV 04/19/2023 12.6 (H)    Neutro Abs 04/19/2023 8,940 (H)    Absolute Lymphocytes 04/19/2023 2,619    Absolute Monocytes 04/19/2023 1,109 (H)    Eosinophils Absolute 04/19/2023 168    Basophils Absolute 04/19/2023 65    Neutrophils Relative % 04/19/2023 69.3    Total Lymphocyte 04/19/2023 20.3    Monocytes Relative 04/19/2023 8.6    Eosinophils Relative 04/19/2023 1.3    Basophils Relative 04/19/2023 0.5    Smear Review 04/19/2023     Color, Urine 04/19/2023 YELLOW    APPearance 04/19/2023 CLOUDY (A)    Specific Gravity, Urine 04/19/2023 1.025    pH 04/19/2023 < OR = 5.0    Glucose, UA 04/19/2023 NEGATIVE    Bilirubin Urine 04/19/2023 NEGATIVE    Ketones, ur 04/19/2023 NEGATIVE    Hgb urine dipstick 04/19/2023 NEGATIVE    Protein, ur 04/19/2023 TRACE (A)    Nitrite 04/19/2023 POSITIVE (A)    Leukocytes,Ua 04/19/2023 2+ (A)    WBC, UA 04/19/2023 40-60 (A)    RBC / HPF 04/19/2023 0-2    Squamous Epithelial / HPF 04/19/2023 10-20 (A)    Bacteria, UA 04/19/2023 MANY (A)    Calcium Oxalate Crystal 04/19/2023 MANY (A)    Hyaline Cast 04/19/2023 0-5 (A)    Vitamin B-12 04/19/2023 644    Folate 04/19/2023 5.0 (L)    Note 04/19/2023    Orders Only on 03/31/2023  Component Date Value   Glucose, Bld 03/31/2023 97    BUN 03/31/2023 14    Creat 03/31/2023 0.64    BUN/Creatinine Ratio 03/31/2023 SEE NOTE:  Sodium 03/31/2023 140    Potassium 03/31/2023 3.7    Chloride 03/31/2023 99    CO2 03/31/2023 26    Calcium 03/31/2023 10.2    Total Protein 03/31/2023 8.3 (H)    Albumin 03/31/2023 4.8    Globulin 03/31/2023 3.5    AG Ratio 03/31/2023 1.4    Total Bilirubin 03/31/2023 0.3    Alkaline phosphatase (AP* 03/31/2023 74     AST 03/31/2023 17    ALT 03/31/2023 9    Vitamin B-12 03/31/2023 865    WBC 03/31/2023 13.7 (H)    RBC 03/31/2023 3.94    Hemoglobin 03/31/2023 12.1    HCT 03/31/2023 36.9    MCV 03/31/2023 93.7    MCH 03/31/2023 30.7    MCHC 03/31/2023 32.8    RDW 03/31/2023 12.3    Platelets 03/31/2023 381    MPV 03/31/2023 11.2    Neutro Abs 03/31/2023 9,302 (H)    Absolute Lymphocytes 03/31/2023 3,192    Absolute Monocytes 03/31/2023 877    Eosinophils Absolute 03/31/2023 247    Basophils Absolute 03/31/2023 82    Neutrophils Relative % 03/31/2023 67.9    Total Lymphocyte 03/31/2023 23.3    Monocytes Relative 03/31/2023 6.4    Eosinophils Relative 03/31/2023 1.8    Basophils Relative 03/31/2023 0.6   Office Visit on 03/26/2023  Component Date Value   Path Review 03/31/2023 8   Clinical Support on 03/03/2023  Component Date Value   WBC 03/05/2023 12.1 (H)    RBC 03/05/2023 3.97    Hemoglobin 03/05/2023 12.2    HCT 03/05/2023 36.2    MCV 03/05/2023 91.2    MCH 03/05/2023 30.7    MCHC 03/05/2023 33.7    RDW 03/05/2023 12.8    Platelets 03/05/2023 399    MPV 03/05/2023 11.2    Neutro Abs 03/05/2023 8,797 (H)    Lymphs Abs 03/05/2023 2,190    Absolute Monocytes 03/05/2023 883    Eosinophils Absolute 03/05/2023 145    Basophils Absolute 03/05/2023 85    Neutrophils Relative % 03/05/2023 72.7    Total Lymphocyte 03/05/2023 18.1    Monocytes Relative 03/05/2023 7.3    Eosinophils Relative 03/05/2023 1.2    Basophils Relative 03/05/2023 0.7   Office Visit on 02/26/2023  Component Date Value   Color, UA 02/26/2023 YELLOW    Clarity, UA 02/26/2023 CLOUDY    Glucose, UA 02/26/2023 Negative    Bilirubin, UA 02/26/2023 NEG    Ketones, UA 02/26/2023 NEG    Spec Grav, UA 02/26/2023 1.020    Blood, UA 02/26/2023 NEG    pH, UA 02/26/2023 6.0    Protein, UA 02/26/2023 Negative    Urobilinogen, UA 02/26/2023 0.2    Nitrite, UA 02/26/2023 POS    Leukocytes, UA 02/26/2023 Negative   Office  Visit on 02/22/2023  Component Date Value   D-Dimer, Quant 02/22/2023 0.29    Troponin I 02/22/2023 <3    Color, Urine 02/22/2023 YELLOW    APPearance 02/22/2023 CLEAR    Specific Gravity, Urine 02/22/2023 1.015    pH 02/22/2023 6.0    Total Protein, Urine 02/22/2023 NEGATIVE    Urine Glucose 02/22/2023 NEGATIVE    Ketones, ur 02/22/2023 NEGATIVE    Bilirubin Urine 02/22/2023 NEGATIVE    Hgb urine dipstick 02/22/2023 NEGATIVE    Urobilinogen, UA 02/22/2023 0.2    Leukocytes,Ua 02/22/2023 NEGATIVE    Nitrite 02/22/2023 NEGATIVE    WBC, UA 02/22/2023 0-2/hpf    RBC / HPF 02/22/2023 0-2/hpf    Squamous  Epithelial / HPF 02/22/2023 Few(5-10/hpf) (A)    Bacteria, UA 02/22/2023 Rare(<10/hpf) (A)    WBC 02/22/2023 13.3 (H)    RBC 02/22/2023 4.18    Hemoglobin 02/22/2023 12.5    HCT 02/22/2023 38.9    MCV 02/22/2023 93.1    MCHC 02/22/2023 32.1    RDW 02/22/2023 13.5    Platelets 02/22/2023 374.0    Neutrophils Relative % 02/22/2023 68.3    Lymphocytes Relative 02/22/2023 22.4    Monocytes Relative 02/22/2023 8.3    Eosinophils Relative 02/22/2023 0.5    Basophils Relative 02/22/2023 0.5    Neutro Abs 02/22/2023 9.1 (H)    Lymphs Abs 02/22/2023 3.0    Monocytes Absolute 02/22/2023 1.1 (H)    Eosinophils Absolute 02/22/2023 0.1    Basophils Absolute 02/22/2023 0.1    Sodium 02/22/2023 139    Potassium 02/22/2023 3.9    Chloride 02/22/2023 101    CO2 02/22/2023 28    Glucose, Bld 02/22/2023 84    BUN 02/22/2023 11    Creatinine, Ser 02/22/2023 0.55    Total Bilirubin 02/22/2023 0.2    Alkaline Phosphatase 02/22/2023 72    AST 02/22/2023 19    ALT 02/22/2023 16    Total Protein 02/22/2023 7.9    Albumin 02/22/2023 4.8    GFR 02/22/2023 109.19    Calcium 02/22/2023 9.9    Color, UA 02/22/2023 yellow    Clarity, UA 02/22/2023 cloudy    Glucose, UA 02/22/2023 Negative    Bilirubin, UA 02/22/2023 Negative    Ketones, UA 02/22/2023 Negative    Spec Grav, UA 02/22/2023 1.020     Blood, UA 02/22/2023 Negative    pH, UA 02/22/2023 6.0    Protein, UA 02/22/2023 Negative    Urobilinogen, UA 02/22/2023 0.2    Nitrite, UA 02/22/2023 Negative    Leukocytes, UA 02/22/2023 Negative    High Sens Troponin I 02/22/2023 4    POC Glucose 02/22/2023 107 (A)   Admission on 02/16/2023, Discharged on 02/16/2023  Component Date Value   WBC 02/16/2023 13.6 (H)    RBC 02/16/2023 3.55 (L)    Hemoglobin 02/16/2023 11.0 (L)    HCT 02/16/2023 33.0 (L)    MCV 02/16/2023 93.0    MCH 02/16/2023 31.0    MCHC 02/16/2023 33.3    RDW 02/16/2023 12.5    Platelets 02/16/2023 250    nRBC 02/16/2023 0.0    Color, Urine 02/16/2023 YELLOW (A)    APPearance 02/16/2023 CLOUDY (A)    Specific Gravity, Urine 02/16/2023 1.016    pH 02/16/2023 5.0    Glucose, UA 02/16/2023 NEGATIVE    Hgb urine dipstick 02/16/2023 NEGATIVE    Bilirubin Urine 02/16/2023 NEGATIVE    Ketones, ur 02/16/2023 NEGATIVE    Protein, ur 02/16/2023 NEGATIVE    Nitrite 02/16/2023 NEGATIVE    Leukocytes,Ua 02/16/2023 TRACE (A)    RBC / HPF 02/16/2023 0-5    WBC, UA 02/16/2023 6-10    Bacteria, UA 02/16/2023 RARE (A)    Squamous Epithelial / HPF 02/16/2023 21-50    Mucus 02/16/2023 PRESENT    Troponin I (High Sensiti* 02/16/2023 <2    Sodium 02/16/2023 137    Potassium 02/16/2023 3.4 (L)    Chloride 02/16/2023 102    CO2 02/16/2023 24    Glucose, Bld 02/16/2023 132 (H)    BUN 02/16/2023 15    Creatinine, Ser 02/16/2023 0.54    Calcium 02/16/2023 8.8 (L)    Total Protein 02/16/2023 6.9    Albumin 02/16/2023 4.1  AST 02/16/2023 23    ALT 02/16/2023 15    Alkaline Phosphatase 02/16/2023 54    Total Bilirubin 02/16/2023 0.6    GFR, Estimated 02/16/2023 >60    Anion gap 02/16/2023 11    Troponin I (High Sensiti* 02/16/2023 3   Office Visit on 02/11/2023  Component Date Value   Color, UA 02/11/2023 Yellow    Clarity, UA 02/11/2023 clear    Glucose, UA 02/11/2023 Negative    Bilirubin, UA 02/11/2023 negative     Ketones, UA 02/11/2023 15    Spec Grav, UA 02/11/2023 1.025    Blood, UA 02/11/2023 negative    pH, UA 02/11/2023 5.0    Protein, UA 02/11/2023 Negative    Urobilinogen, UA 02/11/2023 0.2    Nitrite, UA 02/11/2023 negative    Leukocytes, UA 02/11/2023 Trace (A)    MICRO NUMBER: 02/11/2023 [id number]    SPECIMEN QUALITY: 02/11/2023 Adequate    Sample Source 02/11/2023 URINE    STATUS: 02/11/2023 FINAL    Result: 02/11/2023                     Value:Mixed genital flora isolated. These superficial bacteria are not indicative of a urinary tract infection. No further organism identification is warranted on this specimen. If clinically indicated, recollect clean-catch, mid-stream urine and transfer  immediately to Urine Culture Transport Tube.    WBC, UA 02/11/2023 0-2/hpf    RBC / HPF 02/11/2023 0-2/hpf    Mucus, UA 02/11/2023 Presence of (A)    Squamous Epithelial / HPF 02/11/2023 Few(5-10/hpf) (A)    Hyaline Casts, UA 02/11/2023 Presence of (A)   Office Visit on 01/25/2023  Component Date Value   WBC 01/25/2023 11.8 (H)    RBC 01/25/2023 4.02    Hemoglobin 01/25/2023 12.5    HCT 01/25/2023 37.6    MCV 01/25/2023 93.4    MCHC 01/25/2023 33.2    RDW 01/25/2023 13.8    Platelets 01/25/2023 375.0    Neutrophils Relative % 01/25/2023 64.2    Lymphocytes Relative 01/25/2023 27.2    Monocytes Relative 01/25/2023 6.8    Eosinophils Relative 01/25/2023 1.1    Basophils Relative 01/25/2023 0.7    Neutro Abs 01/25/2023 7.6    Lymphs Abs 01/25/2023 3.2    Monocytes Absolute 01/25/2023 0.8    Eosinophils Absolute 01/25/2023 0.1    Basophils Absolute 01/25/2023 0.1    Sodium 01/25/2023 140    Potassium 01/25/2023 3.4 (L)    Chloride 01/25/2023 102    CO2 01/25/2023 24    Glucose, Bld 01/25/2023 153 (H)    BUN 01/25/2023 14    Creatinine, Ser 01/25/2023 0.62    Total Bilirubin 01/25/2023 0.2    Alkaline Phosphatase 01/25/2023 66    AST 01/25/2023 20    ALT 01/25/2023 18    Total  Protein 01/25/2023 7.9    Albumin 01/25/2023 4.9    GFR 01/25/2023 106.14    Calcium 01/25/2023 9.8    Hgb A1c MFr Bld 01/25/2023 5.8    Vitamin B-12 01/25/2023 411    HSV 1 IGG,TYPE SPECIFIC * 01/25/2023 27.60 (H)    HSV 2 IGG,TYPE SPECIFIC * 01/25/2023 <0.90    RPR Ser Ql 01/25/2023 NON-REACTIVE    Hepatitis B Surface Ag 01/25/2023 Negative    Hep B E Ag 01/25/2023 Negative    Hep B C IgM 01/25/2023 Negative    Hep B Core Total Ab 01/25/2023 Negative    Hep B E Ab 01/25/2023 Non Reactive  Hep B Surface Ab, Qual 01/25/2023 Non Reactive    Hep C Virus Ab 01/25/2023 Non Reactive    HIV Screen 4th Generatio* 01/25/2023 Non Reactive    Neisseria Gonorrhea 01/25/2023 Negative    Chlamydia 01/25/2023 Negative    Trichomonas 01/25/2023 Negative    Bacterial Vaginitis (gar* 01/25/2023 Negative    Candida Vaginitis 01/25/2023 Positive (A)    Candida Glabrata 01/25/2023 Negative    Comment 01/25/2023 Normal Reference Range Candida Species - Negative    Comment 01/25/2023 Normal Reference Range Candida Galbrata - Negative    Comment 01/25/2023 Normal Reference Range Trichomonas - Negative    Comment 01/25/2023 Normal Reference Ranger Chlamydia - Negative    Comment 01/25/2023 Normal Reference Range Neisseria Gonorrhea - Negative    Comment 01/25/2023 Normal Reference Range Bacterial Vaginosis - Negative    Cholesterol 01/25/2023 255 (H)    Triglycerides 01/25/2023 258.0 (H)    HDL 01/25/2023 81.50    VLDL 01/25/2023 51.6 (H)    Total CHOL/HDL Ratio 01/25/2023 3    NonHDL 01/25/2023 173.08    TSH 01/25/2023 0.82    Direct LDL 01/25/2023 160.0   There may be more visits with results that are not included.  No image results found. LONG TERM MONITOR (3-14 DAYS) Result Date: 04/04/2023 NSR with sinus brady (51/min) and sinus tachy (150/min), ave 94/min. Rare PVC's and PAC's, both less than 1% burden. No VT, SVT or atrial fib No prolonged pauses Symptoms recorded coincide with PVC's and  NSR. Patch Wear Time:  13 days and 12 hours ([id number] to 2024-10-03T01:27:53-0400) US  Abdomen Limited RUQ (LIVER/GB) Result Date: 03/03/2023 CLINICAL DATA:  Right upper quadrant abdominal pain for several days. EXAM: ULTRASOUND ABDOMEN LIMITED RIGHT UPPER QUADRANT COMPARISON:  September 02, 2017. FINDINGS: Gallbladder: No gallstones or wall thickening visualized. No sonographic Murphy sign noted by sonographer. Common bile duct: Diameter: 4 mm which is within normal limits. Liver: No focal lesion identified. Within normal limits in parenchymal echogenicity. Portal vein is patent on color Doppler imaging with normal direction of blood flow towards the liver. Other: None. Electronically Signed   By: Lynwood Landy Raddle M.D.   On: 03/03/2023 15:23       Assessment & Plan Injury of low back, initial encounter Severe Right Hip and Buttock Pain Following a twisting injury on Sunday, they present with severe pain in the right hip and buttock, unresponsive to Suboxone , Tylenol , ibuprofen, or gabapentin , disrupting sleep and daily activities. Differential diagnosis includes gluteus medius strain, greater trochanteric pain syndrome, and labral tear, with physical exam and AI analysis suggesting gluteus medius strain or labral tear. The straight leg raise test indicates possible lumbar spine involvement. We discussed the risks of labral tear and the potential for complete muscle tear requiring surgery. We will order an MRI of the lumbar spine to rule out labral tear and assess for lumbar spine injury, refer them to physical therapy and a pain clinic for possible steroid injection and further management. Prescribe Lyrica (pregabalin) as an alternative to gabapentin , continue Suboxone  at the current dose, consider a temporary increase to 24 mg daily for one week if approved, prescribe lidocaine  patches for pain management, consider amitriptyline  for sleep if needed, and advise rest and avoidance of activities  that exacerbate pain.  Chronic Pain Management Their chronic pain management is complicated by the current use of Suboxone , with concerns about the inability to use other pain medications. We discussed the risks and benefits of Suboxone  and alternative pain management strategies, noting that  switching to opioids like Percocet or Vicodin is not advisable due to [facility] regulations and Suboxone 's binding affinity. We discussed the potential use of Lyrica as an alternative to gabapentin  and the possibility of ketamine treatment at the pain clinic. We will continue Suboxone  at the current dose, consider a temporary increase to 24 mg daily for one week if approved, refer them to a pain clinic for further pain management options including possible ketamine treatment, discuss the potential use of Lyrica as an alternative to gabapentin , and advise avoidance of other opioids due to Suboxone  treatment. Hip injury, right, initial encounter Looking at the area of pain areas marked in red on the lateral thigh regions and a pink circular area on the right side, with reported symptoms after walking on ice, I'll analyze this case:  Most likely primary injury: - Gluteus medius strain/tear (particularly on the right side) - This would explain the lateral hip pain and radiation pattern  Key differential diagnoses: 1. Greater trochanteric pain syndrome 2. Labral tear of the hip 3. Lumbar radiculopathy (particularly L5 or S1) 4. IT band syndrome 5. Hip flexor strain 6. Sacroiliac joint dysfunction  Imaging considerations:  Immediate imaging warranted if: - Unable to bear weight - Severe pain with passive range of motion - History of osteoporosis/bone disease - Significant weakness in leg - Age >50 with acute onset  Type of imaging if warranted: 1. X-ray first to rule out:    - Occult fracture    - Degenerative changes    - Alignment issues  2. MRI indicated if:    - Symptoms persist >6 weeks with  conservative treatment    - Significant weakness suggesting complete tear    - Mechanical symptoms (catching, locking)    - Need to rule out labral pathology  3. Consider advanced imaging if concerned about:    - Labral tear    - Complete muscle tear    - Associated soft tissue injury    Would you like me to provide specific physical exam tests that would help narrow down this differential? Lumbar radiculopathy  Lumbar disc herniation with radiculopathy  Paresthesia of skin  Unsteady gait      Orders Placed During this Encounter:  No orders of the defined types were placed in this encounter.  No orders of the defined types were placed in this encounter.   General Health Maintenance   We will monitor her blood pressure regularly, continue current antihypertensive medications, and maintain regular follow-up appointments for ongoing health maintenance.  Follow-up   We will follow up in 2-4 weeks to reassess pain and treatment efficacy, review and discuss MRI results at the next appointment, and coordinate with the pain clinic for urgent referral and follow-up.    This document was synthesized by artificial intelligence ([employer]) using HIPAA-compliant recording of the clinical interaction;   We discussed the use of AI scribe software for clinical note transcription with the patient, who gave verbal consent to proceed.    Additional Info: This encounter employed state-of-the-art, real-time, collaborative documentation. The patient actively reviewed and assisted in updating their electronic medical record on a shared screen, ensuring transparency and facilitating joint problem-solving for the problem list, overview, and plan. This approach promotes accurate, informed care. The treatment plan was discussed and reviewed in detail, including medication safety, potential side effects, and all patient questions. We confirmed understanding and comfort with the plan. Follow-up instructions  were established, including contacting the office for any concerns, returning if symptoms worsen,  persist, or new symptoms develop, and precautions for potential emergency department visits.

## 2023-06-17 ENCOUNTER — Other Ambulatory Visit (HOSPITAL_BASED_OUTPATIENT_CLINIC_OR_DEPARTMENT_OTHER): Payer: Self-pay

## 2023-06-18 ENCOUNTER — Other Ambulatory Visit: Payer: Medicaid Other

## 2023-06-25 ENCOUNTER — Other Ambulatory Visit (INDEPENDENT_AMBULATORY_CARE_PROVIDER_SITE_OTHER): Payer: Medicaid Other

## 2023-06-25 ENCOUNTER — Other Ambulatory Visit (HOSPITAL_BASED_OUTPATIENT_CLINIC_OR_DEPARTMENT_OTHER): Payer: Self-pay

## 2023-06-25 ENCOUNTER — Other Ambulatory Visit: Payer: Self-pay | Admitting: Family Medicine

## 2023-06-25 DIAGNOSIS — E611 Iron deficiency: Secondary | ICD-10-CM | POA: Diagnosis not present

## 2023-06-25 DIAGNOSIS — I10 Essential (primary) hypertension: Secondary | ICD-10-CM

## 2023-06-25 LAB — CBC WITH DIFFERENTIAL/PLATELET
Basophils Absolute: 0 10*3/uL (ref 0.0–0.1)
Basophils Relative: 0.4 % (ref 0.0–3.0)
Eosinophils Absolute: 0.2 10*3/uL (ref 0.0–0.7)
Eosinophils Relative: 2.1 % (ref 0.0–5.0)
HCT: 32.5 % — ABNORMAL LOW (ref 36.0–46.0)
Hemoglobin: 11 g/dL — ABNORMAL LOW (ref 12.0–15.0)
Lymphocytes Relative: 39.4 % (ref 12.0–46.0)
Lymphs Abs: 3.4 10*3/uL (ref 0.7–4.0)
MCHC: 33.8 g/dL (ref 30.0–36.0)
MCV: 90 fL (ref 78.0–100.0)
Monocytes Absolute: 0.6 10*3/uL (ref 0.1–1.0)
Monocytes Relative: 6.4 % (ref 3.0–12.0)
Neutro Abs: 4.5 10*3/uL (ref 1.4–7.7)
Neutrophils Relative %: 51.7 % (ref 43.0–77.0)
Platelets: 334 10*3/uL (ref 150.0–400.0)
RBC: 3.61 Mil/uL — ABNORMAL LOW (ref 3.87–5.11)
RDW: 13.6 % (ref 11.5–15.5)
WBC: 8.7 10*3/uL (ref 4.0–10.5)

## 2023-06-25 LAB — COMPREHENSIVE METABOLIC PANEL
ALT: 15 U/L (ref 0–35)
AST: 19 U/L (ref 0–37)
Albumin: 4.6 g/dL (ref 3.5–5.2)
Alkaline Phosphatase: 74 U/L (ref 39–117)
BUN: 9 mg/dL (ref 6–23)
CO2: 29 meq/L (ref 19–32)
Calcium: 9.4 mg/dL (ref 8.4–10.5)
Chloride: 101 meq/L (ref 96–112)
Creatinine, Ser: 0.59 mg/dL (ref 0.40–1.20)
GFR: 107.1 mL/min (ref >60.00)
Glucose, Bld: 96 mg/dL (ref 70–99)
Potassium: 3.9 meq/L (ref 3.5–5.1)
Sodium: 139 meq/L (ref 135–145)
Total Bilirubin: 0.2 mg/dL (ref 0.2–1.2)
Total Protein: 7.1 g/dL (ref 6.0–8.3)

## 2023-06-25 LAB — IBC + FERRITIN
Ferritin: 18 ng/mL (ref 10.0–291.0)
Iron: 70 ug/dL (ref 42–145)
Saturation Ratios: 18.6 % — ABNORMAL LOW (ref 20.0–50.0)
TIBC: 376.6 ug/dL (ref 250.0–450.0)
Transferrin: 269 mg/dL (ref 212.0–360.0)

## 2023-06-25 LAB — FOLATE: Folate: 8.1 ng/mL (ref >5.9)

## 2023-06-25 MED ORDER — BUPRENORPHINE HCL-NALOXONE HCL 8-2 MG SL FILM
2.0000 | ORAL_FILM | Freq: Every day | SUBLINGUAL | 0 refills | Status: DC
  Filled 2023-06-25 – 2023-06-29 (×2): qty 60, 30d supply, fill #0

## 2023-06-25 NOTE — Addendum Note (Signed)
Addended by: Jobe Gibbon on: 06/25/2023 02:19 PM   Modules accepted: Orders

## 2023-06-25 NOTE — Addendum Note (Signed)
Addended by: [name] on: 06/25/2023 04:57 PM   Modules accepted: Orders

## 2023-06-27 ENCOUNTER — Encounter: Payer: Self-pay | Admitting: Family Medicine

## 2023-06-27 NOTE — Progress Notes (Signed)
Labs are a little better.  Has appt w/GI next month.  Iron still a bit low-is she taking iron?

## 2023-06-29 ENCOUNTER — Other Ambulatory Visit (HOSPITAL_BASED_OUTPATIENT_CLINIC_OR_DEPARTMENT_OTHER): Payer: Self-pay

## 2023-07-03 ENCOUNTER — Other Ambulatory Visit: Payer: Self-pay | Admitting: Family Medicine

## 2023-07-07 ENCOUNTER — Ambulatory Visit: Payer: Medicaid Other | Admitting: Family Medicine

## 2023-07-07 ENCOUNTER — Other Ambulatory Visit (HOSPITAL_BASED_OUTPATIENT_CLINIC_OR_DEPARTMENT_OTHER): Payer: Self-pay

## 2023-07-07 ENCOUNTER — Encounter: Payer: Self-pay | Admitting: Family Medicine

## 2023-07-07 VITALS — BP 130/84 | HR 94 | Temp 97.9°F | Resp 18 | Ht 59.0 in | Wt 149.4 lb

## 2023-07-07 DIAGNOSIS — I10 Essential (primary) hypertension: Secondary | ICD-10-CM

## 2023-07-07 DIAGNOSIS — M5416 Radiculopathy, lumbar region: Secondary | ICD-10-CM

## 2023-07-07 DIAGNOSIS — R110 Nausea: Secondary | ICD-10-CM | POA: Diagnosis not present

## 2023-07-07 DIAGNOSIS — R42 Dizziness and giddiness: Secondary | ICD-10-CM | POA: Diagnosis not present

## 2023-07-07 MED ORDER — OXYCODONE-ACETAMINOPHEN 10-325 MG PO TABS
1.0000 | ORAL_TABLET | Freq: Three times a day (TID) | ORAL | 0 refills | Status: DC | PRN
  Filled 2023-07-07: qty 10, 4d supply, fill #0

## 2023-07-07 MED ORDER — PREDNISONE 20 MG PO TABS
40.0000 mg | ORAL_TABLET | Freq: Every day | ORAL | 0 refills | Status: AC
  Filled 2023-07-07: qty 10, 5d supply, fill #0

## 2023-07-07 MED ORDER — KETOROLAC TROMETHAMINE 60 MG/2ML IM SOLN
60.0000 mg | Freq: Once | INTRAMUSCULAR | Status: AC
  Administered 2023-07-07: 60 mg via INTRAMUSCULAR

## 2023-07-07 MED ORDER — ONDANSETRON HCL 4 MG PO TABS
4.0000 mg | ORAL_TABLET | Freq: Three times a day (TID) | ORAL | 1 refills | Status: AC | PRN
  Filled 2023-07-07: qty 20, 7d supply, fill #0

## 2023-07-07 MED ORDER — METHYLPREDNISOLONE ACETATE 80 MG/ML IJ SUSP
80.0000 mg | Freq: Once | INTRAMUSCULAR | Status: AC
  Administered 2023-07-07: 80 mg via INTRAMUSCULAR

## 2023-07-07 NOTE — Progress Notes (Signed)
Subjective:     Patient ID: Laurie Roman, female    DOB: 05/08/1976, 48 y.o.   MRN: [id number]  Chief Complaint  Patient presents with   Slipped in Bathroom    Patient slipped in bathroom, trying to help clean poop, hit back of head on the frame of the door, hurt hip Feeling light headed    HPI Discussed the use of AI scribe software for clinical note transcription with the patient, who gave verbal consent to proceed.  History of Present Illness   The patient presents with pain and burning sensation after slipping and hitting their head and leg.   was assisting patient who rang call light in bathroom.  as Gola was opening the door, she slipped on feces and R leg went up in the air while back of head hit the door.   They slipped and hit their head and  pulled right leg around 11 AM today, resulting in immediate pain and a burning sensation in the tailbone and down the back of the right leg. They feel weak and dizzy since the incident but did not lose consciousness. some if that may be d/t pain.  did not fall to floor  The pain is described as severe and unlike any they have experienced before, stating 'my whole butt is burning'. They have not taken pain medication recently but have used hydrocodone and Suboxone in the past, which they have not refilled since the last prescription.  They have a history of leg pain, typically on the right side, and note that the current pain is more severe than usual. They have been performing exercises at home, including using a tennis ball for relief. They have not had recent imaging studies due to insurance issues and a previously canceled MRI appointment.  They note an increase in blood pressure following the fall. They have not been sleeping well and started taking iron pills yesterday. They take tizanidine at night for sleep and pain management but have not picked up their meloxicam or Adderall prescriptions recently.  They report swelling in  the right ankle, which they attribute to fluid retention, and tingling in the feet-long time.  They have been consuming smoothies and flatbreads but note difficulty eating due to pain.        Health Maintenance Due  Topic Date Due   Colonoscopy  Never done    Past Medical History:  Diagnosis Date   ADD (attention deficit disorder)    Anxiety    Asthma    Blood transfusion without reported diagnosis    Depression    GERD (gastroesophageal reflux disease)    IBS (irritable bowel syndrome)    Insomnia    Lumbago    Migraine    Opioid dependence (HCC) 08/21/2022   Explained 08/21/22 that 4 months hydrocodone for pain management for delays in dental care has resulted in hyperalgesia and dependence   Other chest pain 08/17/2022   Normal EKG and D-dimer  Associated with lifting on father Doesn't radiate  Nonexertional  Not substernal chest pain more to the side, feels related to sleeping on it wrong (reproducible)   Sciatica    Sleep apnea    Tietze's disease    Urinary incontinence 10/26/2022    Past Surgical History:  Procedure Laterality Date   APPENDECTOMY     CESAREAN SECTION     TONSILLECTOMY     TOTAL ABDOMINAL HYSTERECTOMY W/ BILATERAL SALPINGOOPHORECTOMY     TUBAL LIGATION  Current Outpatient Medications:    albuterol (VENTOLIN HFA) 108 (90 Base) MCG/ACT inhaler, Inhale 2 puffs into the lungs every 6 (six) hours as needed for wheezing or shortness of breath., Disp: 18 g, Rfl: 3   amitriptyline (ELAVIL) 25 MG tablet, Take 1 tablet (25 mg total) by mouth at bedtime as needed for sleep., Disp: 30 tablet, Rfl: 1   amLODipine (NORVASC) 10 MG tablet, Take 1 tablet (10 mg total) by mouth daily. Replaces amlodipine 5, for uncontrolled blood pressure., Disp: 90 tablet, Rfl: 3   amphetamine-dextroamphetamine (ADDERALL) 20 MG tablet, Take 1 tablet (20 mg total) by mouth 2 (two) times daily., Disp: 60 tablet, Rfl: 0   ARIPiprazole (ABILIFY) 2 MG tablet, Take 1 tablet (2 mg  total) by mouth daily as needed., Disp: 90 tablet, Rfl: 3   Buprenorphine HCl-Naloxone HCl (SUBOXONE) 8-2 MG FILM, Place 2 Film under the tongue daily at 6 (six) AM. At first, take only 0.25  films (2 mg opioid) each hour until minimum effective dose reached to control cravings, up to 2 films total in day max., Disp: 60 each, Rfl: 0   chlorhexidine (PERIDEX) 0.12 % solution, Use as directed 15 mLs in the mouth or throat 2 (two) times daily., Disp: 1893 mL, Rfl: 0   ciclopirox (LOPROX) 0.77 % cream, Apply topically 2 (two) times daily., Disp: 90 g, Rfl: 3   Cyanocobalamin (B-12) 1000 MCG SUBL, Place 1 tablet under the tongue daily at 6 (six) AM., Disp: 100 tablet, Rfl: 3   Erenumab-aooe (AIMOVIG) 140 MG/ML SOAJ, Inject 140 mg into the skin every 30 (thirty) days., Disp: 1 mL, Rfl: 1   fluconazole (DIFLUCAN) 150 MG tablet, Take 1 tablet (150 mg total) by mouth every three (3) days as needed., Disp: 2 tablet, Rfl: 0   fluticasone (FLONASE) 50 MCG/ACT nasal spray, Place 1 spray into both nostrils daily., Disp: 48 g, Rfl: 3   gabapentin (NEURONTIN) 600 MG tablet, Take 1 tablet (600 mg total) by mouth 3 (three) times daily., Disp: 90 tablet, Rfl: 0   gabapentin (NEURONTIN) 800 MG tablet, Take 1 tablet (800 mg total) by mouth 4 (four) times daily as needed. Replaces prior dosing schedule., Disp: 270 tablet, Rfl: 1   ketorolac (TORADOL) 10 MG tablet, Take 1 tablet (10 mg total) by mouth every 6 (six) hours as needed., Disp: 20 tablet, Rfl: 0   lidocaine (XYLOCAINE) 5 % ointment, Apply 1 Application topically as needed., Disp: 35.44 g, Rfl: 0   linaclotide (LINZESS) 145 MCG CAPS capsule, Take 1 capsule (145 mcg total) by mouth daily before breakfast., Disp: 90 capsule, Rfl: 3   losartan (COZAAR) 100 MG tablet, Take 1 tablet (100 mg total) by mouth daily., Disp: 90 tablet, Rfl: 1   metoprolol tartrate (LOPRESSOR) 25 MG tablet, Take 1 tablet (25 mg total) by mouth 2 (two) times daily., Disp: 180 tablet, Rfl: 0    mometasone-formoterol (DULERA) 100-5 MCG/ACT AERO, Inhale 2 puffs into the lungs 2 (two) times daily., Disp: 13 g, Rfl: 2   nystatin (MYCOSTATIN) 100000 UNIT/ML suspension, Take 5 mLs (500,000 Units total) by mouth 4 (four) times daily. Swish and hold in mouth few seconds and swallow, Disp: 60 mL, Rfl: 0   nystatin (MYCOSTATIN/NYSTOP) powder, Apply 1 Application topically 3 (three) times daily., Disp: 60 g, Rfl: 11   nystatin cream (MYCOSTATIN), Apply 1 Application topically 2 (two) times daily., Disp: 30 g, Rfl: 5   oxyCODONE-acetaminophen (PERCOCET) 10-325 MG tablet, Take 1 tablet by mouth every  8 (eight) hours as needed for up to 5 days for pain., Disp: 10 tablet, Rfl: 0   pantoprazole (PROTONIX) 40 MG tablet, Take 1 tablet (40 mg total) by mouth 2 (two) times daily before a meal., Disp: 180 tablet, Rfl: 1   phenazopyridine (PYRIDIUM) 200 MG tablet, Take 200 mg by mouth 3 (three) times daily., Disp: , Rfl:    potassium chloride (KLOR-CON M) 20 MEQ tablet, Take 1 tablet (20 mEq total) by mouth daily., Disp: 90 tablet, Rfl: 0   predniSONE (DELTASONE) 20 MG tablet, Take 2 tablets (40 mg total) by mouth daily with breakfast for 5 days., Disp: 10 tablet, Rfl: 0   Probiotic Product (PROBIOTIC DAILY) CAPS, Take 1 capsule by mouth daily., Disp: 30 capsule, Rfl: 2   rizatriptan (MAXALT) 10 MG tablet, Take 1 tablet (10 mg total) by mouth as needed for migraine. May repeat in 2 hours if needed, Disp: 10 tablet, Rfl: 3   tiZANidine (ZANAFLEX) 4 MG tablet, Take 1 tablet (4 mg total) by mouth every 6 (six) hours as needed for muscle spasms., Disp: 30 tablet, Rfl: 3   trimethoprim (TRIMPEX) 100 MG tablet, Take 100 mg by mouth daily., Disp: , Rfl:    ondansetron (ZOFRAN) 4 MG tablet, Take 1 tablet (4 mg total) by mouth every 8 (eight) hours as needed for nausea or vomiting., Disp: 20 tablet, Rfl: 1  Current Facility-Administered Medications:    ondansetron (ZOFRAN-ODT) disintegrating tablet 4 mg, 4 mg, Oral,  Once, [name], MD  Allergies  Allergen Reactions   Cefazolin Hives    Only IV formulation can safely take by mouth keflex   Other Hives    Odansetron injection.   Sulfa Antibiotics Rash   Tramadol Hcl     Other reaction(s): chest pain   Emgality [Galcanezumab-Gnlm]    Ultram [Tramadol] Other (See Comments)    Tachycardia   Nitrofurantoin Itching    Red welts.   Sulfasalazine Rash   ROS neg/noncontributory except as noted HPI/below      Objective:     BP 130/84 (BP Location: Left Arm, Patient Position: Sitting, Cuff Size: Large)   Pulse 94   Temp 97.9 F (36.6 C) (Temporal)   Resp 18   Ht 4\' 11"  (1.499 m)   Wt 149 lb 6 oz (67.8 kg)   SpO2 99%   BMI 30.17 kg/m  Wt Readings from Last 3 Encounters:  07/07/23 149 lb 6 oz (67.8 kg)  06/16/23 138 lb 15.7 oz (63 kg)  05/18/23 142 lb 6 oz (64.6 kg)    Physical Exam   Gen: WDWN NAD x pain HEENT: NCAT, conjunctiva not injected, sclera nonicteric NECK:  supple, no thyromegaly, no nodes, no carotid bruits CARDIAC: RRR, S1S2+, no murmur.  EXT: tr r footedema MSK:   NEURO: A&O x3.  CN II-XII intact.  PSYCH: normal mood. Good eye contact  Back:  can stand on heels/toes/1 leg.  + TTP lumbar spine to coccyx. + R SI tenderness and paraspinous muscle tenderness. No rash.  MS 5/5 BLE.  SLR + R.  Good ROM      Assessment & Plan:  Lumbar radiculopathy -     Ketorolac Tromethamine -     methylPREDNISolone Acetate  Nausea -     Ondansetron HCl; Take 1 tablet (4 mg total) by mouth every 8 (eight) hours as needed for nausea or vomiting.  Dispense: 20 tablet; Refill: 1  Dizziness  Other orders -     oxyCODONE-Acetaminophen; Take  1 tablet by mouth every 8 (eight) hours as needed for up to 5 days for pain.  Dispense: 10 tablet; Refill: 0 -     predniSONE; Take 2 tablets (40 mg total) by mouth daily with breakfast for 5 days.  Dispense: 10 tablet; Refill: 0  Assessment and Plan    Lumbar Strain with Sciatica A slip  at work caused severe pain and burning in the tailbone and down the back of the right leg, accompanied by dizziness likely due to elevated blood pressure from pain. Examination shows tenderness and pain on the right side. Discussed steroid injections and pain medications, with a preference to avoid long-term use due to dependency concerns. Administer Toradol and prednisone injections, prescribe prednisone pills and oxycodone for severe pain, and ensure an adequate supply of tizanidine at home. Advise reporting the incident for potential further imaging or treatment.  Head Injury A head injury occurred during the fall without loss of consciousness, resulting in mild dizziness and tiredness, possibly worsened by poor sleep and pain. Examination reveals no significant neurological deficits. Monitor for worsening symptoms and emphasize rest and hydration.  Hypertension Blood pressure is elevated at 130/84, likely due to pain from the fall. Continue monitoring blood pressure and maintain current hypertension management.  General Health Maintenance Has not picked up the Adderall prescription and is attempting to manage health better. Requested more nausea medication. Prescribe nausea medication and encourage adherence to prescribed medications.  Follow-up Advise reporting the incident and reschedule the MRI that was canceled due to snow.        No follow-ups on file.  Angelena Sole, MD

## 2023-07-09 ENCOUNTER — Other Ambulatory Visit: Payer: Self-pay | Admitting: Family Medicine

## 2023-07-09 ENCOUNTER — Encounter: Payer: Self-pay | Admitting: Family Medicine

## 2023-07-09 ENCOUNTER — Other Ambulatory Visit (HOSPITAL_BASED_OUTPATIENT_CLINIC_OR_DEPARTMENT_OTHER): Payer: Self-pay

## 2023-07-09 MED ORDER — HYDROCODONE-ACETAMINOPHEN 10-325 MG PO TABS
1.0000 | ORAL_TABLET | Freq: Three times a day (TID) | ORAL | 0 refills | Status: AC | PRN
  Filled 2023-07-09 (×2): qty 10, 4d supply, fill #0

## 2023-07-09 NOTE — Progress Notes (Signed)
Got rash from oxy so change to vicodin

## 2023-07-12 ENCOUNTER — Other Ambulatory Visit: Payer: Self-pay

## 2023-07-12 ENCOUNTER — Other Ambulatory Visit: Payer: Self-pay | Admitting: Family Medicine

## 2023-07-12 ENCOUNTER — Ambulatory Visit (INDEPENDENT_AMBULATORY_CARE_PROVIDER_SITE_OTHER): Payer: Medicaid Other

## 2023-07-12 DIAGNOSIS — M5416 Radiculopathy, lumbar region: Secondary | ICD-10-CM

## 2023-07-12 DIAGNOSIS — S79911A Unspecified injury of right hip, initial encounter: Secondary | ICD-10-CM

## 2023-07-12 DIAGNOSIS — S3992XA Unspecified injury of lower back, initial encounter: Secondary | ICD-10-CM

## 2023-07-12 MED ORDER — KETOROLAC TROMETHAMINE 60 MG/2ML IM SOLN: 60.0000 mg | Freq: Once | INTRAMUSCULAR | Status: DC

## 2023-07-12 MED ORDER — KETOROLAC TROMETHAMINE 60 MG/2ML IM SOLN
60.0000 mg | Freq: Once | INTRAMUSCULAR | Status: AC
  Administered 2023-07-12: 60 mg via INTRAMUSCULAR

## 2023-07-12 NOTE — Progress Notes (Signed)
Patient was given a toradol injection in the left thigh today. Patient tolerated injection well. Donzetta Starch, CMA

## 2023-07-13 ENCOUNTER — Other Ambulatory Visit: Payer: Self-pay | Admitting: Family Medicine

## 2023-07-13 DIAGNOSIS — M5431 Sciatica, right side: Secondary | ICD-10-CM

## 2023-07-16 ENCOUNTER — Other Ambulatory Visit (HOSPITAL_BASED_OUTPATIENT_CLINIC_OR_DEPARTMENT_OTHER): Payer: Self-pay

## 2023-07-28 ENCOUNTER — Other Ambulatory Visit: Payer: Self-pay | Admitting: Family Medicine

## 2023-07-29 ENCOUNTER — Other Ambulatory Visit (HOSPITAL_BASED_OUTPATIENT_CLINIC_OR_DEPARTMENT_OTHER): Payer: Self-pay

## 2023-08-02 ENCOUNTER — Other Ambulatory Visit: Payer: Self-pay | Admitting: Family Medicine

## 2023-08-02 DIAGNOSIS — S39012A Strain of muscle, fascia and tendon of lower back, initial encounter: Secondary | ICD-10-CM | POA: Diagnosis not present

## 2023-08-02 DIAGNOSIS — F902 Attention-deficit hyperactivity disorder, combined type: Secondary | ICD-10-CM

## 2023-08-02 DIAGNOSIS — F0781 Postconcussional syndrome: Secondary | ICD-10-CM | POA: Diagnosis not present

## 2023-08-02 MED ORDER — AMPHETAMINE-DEXTROAMPHETAMINE 20 MG PO TABS
20.0000 mg | ORAL_TABLET | Freq: Two times a day (BID) | ORAL | 0 refills | Status: DC
Start: 2023-08-02 — End: 2023-08-25

## 2023-08-02 NOTE — Telephone Encounter (Signed)
 Needs appt in March.

## 2023-08-03 ENCOUNTER — Other Ambulatory Visit: Payer: Self-pay | Admitting: Family Medicine

## 2023-08-03 ENCOUNTER — Ambulatory Visit: Payer: Self-pay

## 2023-08-03 DIAGNOSIS — M545 Low back pain, unspecified: Secondary | ICD-10-CM

## 2023-08-03 NOTE — Telephone Encounter (Signed)
 Patient states she will schedule appointment later

## 2023-08-04 ENCOUNTER — Other Ambulatory Visit (HOSPITAL_BASED_OUTPATIENT_CLINIC_OR_DEPARTMENT_OTHER): Payer: Self-pay

## 2023-08-05 ENCOUNTER — Ambulatory Visit: Payer: Medicaid Other | Admitting: Physician Assistant

## 2023-08-05 NOTE — Progress Notes (Deleted)
 Celso Amy, PA-C [address]  [address]  [place], [place] [address]  Main: [phone]  Fax: [phone]   Gastroenterology Consultation  Referring Provider:     Jeani Sow, MD Primary Care Physician:  Jeani Sow, MD Primary Gastroenterologist:  *** Reason for Consultation:     GERD, IDA, IBS        HPI:   Laurie Roman is a 48 y.o. y/o female referred for consultation & management  by Jeani Sow, MD.    Lab 04/19/23: Hgb 10.7g, low Iron 24, Iron sat 7%. Lab 06/25/23:  Hgb 11.0.  02/2023: RUQ [place]: No gallstones.  Normal.  No prior EGD / Colon / or GI Eval.  Past Medical History:  Diagnosis Date   ADD (attention deficit disorder)    Anxiety    Asthma    Blood transfusion without reported diagnosis    Depression    GERD (gastroesophageal reflux disease)    IBS (irritable bowel syndrome)    Insomnia    Lumbago    Migraine    Opioid dependence (HCC) 08/21/2022   Explained 08/21/22 that 4 months hydrocodone for pain management for delays in dental care has resulted in hyperalgesia and dependence   Other chest pain 08/17/2022   Normal EKG and D-dimer  Associated with lifting on father Doesn't radiate  Nonexertional  Not substernal chest pain more to the side, feels related to sleeping on it wrong (reproducible)   Sciatica    Sleep apnea    Tietze's disease    Urinary incontinence 10/26/2022    Past Surgical History:  Procedure Laterality Date   APPENDECTOMY     CESAREAN SECTION     TONSILLECTOMY     TOTAL ABDOMINAL HYSTERECTOMY W/ BILATERAL SALPINGOOPHORECTOMY     TUBAL LIGATION      Prior to Admission medications   Medication Sig Start Date End Date Taking? Authorizing Provider  albuterol (VENTOLIN HFA) 108 (90 Base) MCG/ACT inhaler Inhale 2 puffs into the lungs every 6 (six) hours as needed for wheezing or shortness of breath. 02/11/23   Laurie Olszewski, MD  amitriptyline (ELAVIL) 25 MG tablet Take 1 tablet (25 mg total) by mouth at  bedtime as needed for sleep. 06/16/23   Laurie Olszewski, MD  amLODipine (NORVASC) 10 MG tablet Take 1 tablet (10 mg total) by mouth daily. Replaces amlodipine 5, for uncontrolled blood pressure. 05/18/23   Jeani Sow, MD  amphetamine-dextroamphetamine (ADDERALL) 20 MG tablet Take 1 tablet (20 mg total) by mouth 2 (two) times daily. 08/02/23 09/01/23  Jeani Sow, MD  ARIPiprazole (ABILIFY) 2 MG tablet Take 1 tablet (2 mg total) by mouth daily as needed. 05/27/23   Laurie Olszewski, MD  Buprenorphine HCl-Naloxone HCl (SUBOXONE) 8-2 MG FILM Place 2 Film under the tongue daily at 6 (six) AM. At first, take only 0.25  films (2 mg opioid) each hour until minimum effective dose reached to control cravings, up to 2 films total in day max. 06/25/23   Laurie Olszewski, MD  chlorhexidine (PERIDEX) 0.12 % solution Use as directed 15 mLs in the mouth or throat 2 (two) times daily. 07/07/22   Laurie Olszewski, MD  ciclopirox (LOPROX) 0.77 % cream Apply topically 2 (two) times daily. 05/10/23   Laurie Olszewski, MD  Cyanocobalamin (B-12) 1000 MCG SUBL Place 1 tablet under the tongue daily at 6 (six) AM. 04/21/23   Laurie Olszewski, MD  Erenumab-aooe (AIMOVIG) 140 MG/ML  SOAJ Inject 140 mg into the skin every 30 (thirty) days. 07/28/23   Jeani Sow, MD  fluconazole (DIFLUCAN) 150 MG tablet Take 1 tablet (150 mg total) by mouth every three (3) days as needed. 05/18/23   Jeani Sow, MD  fluticasone [facility]) 50 MCG/ACT nasal spray Place 1 spray into both nostrils daily. 05/18/23   Jeani Sow, MD  gabapentin (NEURONTIN) 600 MG tablet Take 1 tablet (600 mg total) by mouth 3 (three) times daily. 07/03/23   Eulis Foster, FNP  gabapentin (NEURONTIN) 800 MG tablet Take 1 tablet (800 mg total) by mouth 4 (four) times daily as needed. Replaces prior dosing schedule. 06/16/23   Laurie Olszewski, MD  ketorolac (TORADOL) 10 MG tablet Take 1 tablet (10 mg total) by mouth every 6 (six) hours as needed. 04/02/23    Laurie Olszewski, MD  lidocaine (XYLOCAINE) 5 % ointment Apply 1 Application topically as needed. 06/16/23   Laurie Olszewski, MD  linaclotide [facility]) 145 MCG CAPS capsule Take 1 capsule (145 mcg total) by mouth daily before breakfast. 04/19/23   Laurie Olszewski, MD  losartan (COZAAR) 100 MG tablet Take 1 tablet (100 mg total) by mouth daily. 05/18/23   Jeani Sow, MD  metoprolol tartrate (LOPRESSOR) 25 MG tablet Take 1 tablet (25 mg total) by mouth 2 (two) times daily. 01/29/23   Jeani Sow, MD  mometasone-formoterol [facility]) 100-5 MCG/ACT AERO Inhale 2 puffs into the lungs 2 (two) times daily. 05/18/23   Jeani Sow, MD  nystatin (MYCOSTATIN) 100000 UNIT/ML suspension Take 5 mLs (500,000 Units total) by mouth 4 (four) times daily. Swish and hold in mouth few seconds and swallow 10/01/22   Jeani Sow, MD  nystatin (MYCOSTATIN/NYSTOP) powder Apply 1 Application topically 3 (three) times daily. 01/01/23   Laurie Olszewski, MD  nystatin cream (MYCOSTATIN) Apply 1 Application topically 2 (two) times daily. 01/01/23   Laurie Olszewski, MD  ondansetron (ZOFRAN) 4 MG tablet Take 1 tablet (4 mg total) by mouth every 8 (eight) hours as needed for nausea or vomiting. 07/07/23   Jeani Sow, MD  pantoprazole (PROTONIX) 40 MG tablet Take 1 tablet (40 mg total) by mouth 2 (two) times daily before a meal. 05/18/23   Jeani Sow, MD  phenazopyridine (PYRIDIUM) 200 MG tablet Take 200 mg by mouth 3 (three) times daily. 05/13/23   [provider]  potassium chloride (KLOR-CON M) 20 MEQ tablet Take 1 tablet (20 mEq total) by mouth daily. 01/28/23   Jeani Sow, MD  Probiotic Product (PROBIOTIC DAILY) CAPS Take 1 capsule by mouth daily. 04/19/23   Laurie Olszewski, MD  rizatriptan (MAXALT) 10 MG tablet Take 1 tablet (10 mg total) by mouth as needed for migraine. May repeat in 2 hours if needed 05/18/23   Jeani Sow, MD  tiZANidine (ZANAFLEX) 4 MG tablet Take 1 tablet (4 mg  total) by mouth every 6 (six) hours as needed for muscle spasms. 05/11/23   Jeani Sow, MD  trimethoprim (TRIMPEX) 100 MG tablet Take 100 mg by mouth daily. 05/05/23   [provider]    Family History  Problem Relation Age of Onset   Hypertension Mother    Hyperlipidemia Mother    Hypertension Father    ADD / ADHD Father    Leukemia Sister    Breast cancer Maternal Aunt    Migraines Daughter    Hypertension Maternal Grandmother    Clotting  disorder Maternal Grandfather      Social History   Tobacco Use   Smoking status: Never   Smokeless tobacco: Never  Vaping Use   Vaping status: Every Day  Substance Use Topics   Alcohol use: Not Currently    Comment: occasional   Drug use: No    Allergies as of 08/05/2023 - Review Complete 07/07/2023  Allergen Reaction Noted   Cefazolin Hives 04/28/2014   Other Hives 03/16/2023   Sulfa antibiotics Rash 06/21/2015   Tramadol hcl  03/27/2022   Emgality [galcanezumab-gnlm]  12/24/2017   Ultram [tramadol] Other (See Comments) 06/21/2015   Nitrofurantoin Itching 02/11/2023   Oxycodone Rash 12/31/2022   Sulfasalazine Rash 06/21/2015    Review of Systems:    All systems reviewed and negative except where noted in HPI.   Physical Exam:  There were no vitals taken for this visit. No LMP recorded. Patient has had a hysterectomy.  General:   Alert,  Well-developed, well-nourished, pleasant and cooperative in NAD Lungs:  Respirations even and unlabored.  Clear throughout to auscultation.   No wheezes, crackles, or rhonchi. No acute distress. Heart:  Regular rate and rhythm; no murmurs, clicks, rubs, or gallops. Abdomen:  Normal bowel sounds.  No bruits.  Soft, and non-distended without masses, hepatosplenomegaly or hernias noted.  No Tenderness.  No guarding or rebound tenderness.    Neurologic:  Alert and oriented x3;  grossly normal neurologically. Psych:  Alert and cooperative. Normal mood and affect.  Imaging  Studies: DG Lumbar Spine Complete Result Date: 08/03/2023 CLINICAL DATA:  Lumbar back pain from a fall EXAM: LUMBAR SPINE - COMPLETE 4+ VIEW COMPARISON:  09/06/2013 FINDINGS: There is no evidence of lumbar spine fracture. Alignment is normal. Intervertebral disc spaces are maintained. IMPRESSION: Negative. Electronically Signed   By: Corlis Leak M.D.   On: 08/03/2023 12:38    Assessment and Plan:   ANETTE BARRA is a 49 y.o. y/o female has been referred for:  Iron Deficiency Anemia  Scheduling EGD & Colonoscopy I discussed risks of EGD and colonoscopy with patient to include risk of bleeding, perforation, and risk of sedation.  Patient expressed understanding and agrees to proceed with procedures.    GERD  IBS  Follow up ***  Celso Amy, PA-C    BP check ***

## 2023-08-06 ENCOUNTER — Other Ambulatory Visit: Payer: Self-pay | Admitting: Family Medicine

## 2023-08-06 MED ORDER — GABAPENTIN 600 MG PO TABS: 600.0000 mg | ORAL_TABLET | Freq: Three times a day (TID) | ORAL | 0 refills | Status: DC

## 2023-08-06 NOTE — Telephone Encounter (Signed)
 Needs appt

## 2023-08-09 NOTE — Telephone Encounter (Signed)
 Patient notified and verbalized understanding.

## 2023-08-10 ENCOUNTER — Ambulatory Visit (HOSPITAL_BASED_OUTPATIENT_CLINIC_OR_DEPARTMENT_OTHER): Payer: Medicaid Other | Admitting: Cardiology

## 2023-08-11 ENCOUNTER — Other Ambulatory Visit (HOSPITAL_BASED_OUTPATIENT_CLINIC_OR_DEPARTMENT_OTHER): Payer: Self-pay

## 2023-08-11 ENCOUNTER — Ambulatory Visit (INDEPENDENT_AMBULATORY_CARE_PROVIDER_SITE_OTHER): Admitting: Internal Medicine

## 2023-08-11 ENCOUNTER — Encounter: Payer: Self-pay | Admitting: Internal Medicine

## 2023-08-11 VITALS — BP 139/87 | HR 94 | Temp 97.8°F | Ht 59.0 in | Wt 135.0 lb

## 2023-08-11 DIAGNOSIS — G43909 Migraine, unspecified, not intractable, without status migrainosus: Secondary | ICD-10-CM

## 2023-08-11 DIAGNOSIS — G8929 Other chronic pain: Secondary | ICD-10-CM | POA: Diagnosis not present

## 2023-08-11 DIAGNOSIS — M5442 Lumbago with sciatica, left side: Secondary | ICD-10-CM | POA: Diagnosis not present

## 2023-08-11 DIAGNOSIS — M5441 Lumbago with sciatica, right side: Secondary | ICD-10-CM

## 2023-08-11 MED ORDER — LIDOCAINE 10 % EX CREA: 1.0000 | TOPICAL_CREAM | Freq: Every day | CUTANEOUS | 11 refills | Status: DC

## 2023-08-11 MED ORDER — LIDOCAINE 4 % EX PTCH
1.0000 | MEDICATED_PATCH | CUTANEOUS | 1 refills | Status: DC
Start: 2023-08-11 — End: 2024-02-28

## 2023-08-11 MED ORDER — NONFORMULARY OR COMPOUNDED ITEM
3 refills | Status: DC
Start: 2023-08-11 — End: 2023-09-28

## 2023-08-11 MED ORDER — NURTEC 75 MG PO TBDP: 1.0000 | ORAL_TABLET | ORAL | 2 refills | Status: DC

## 2023-08-11 NOTE — Progress Notes (Signed)
 ==============================  [facility]: [phone]   -- Medical Office Visit --  Patient: Laurie Roman      Age: 48 y.o.       Sex:  female  Date:   08/11/2023 Today's Healthcare Provider: Bernardino KANDICE Cone, MD  ==============================   CHIEF COMPLAINT: No chief complaint on file. Pain (headache and back pain since fall workers compensation incident) that suboxone /tylenol  doesn't help.   SUBJECTIVE: Background This is a 48 y.o. female who has Migraine; ADHD; Essential hypertension; Mild obstructive sleep apnea; Controlled substance agreement signed; Gastroesophageal reflux disease without esophagitis; Left sided sciatica; Asthma; Maxillary sinusitis; Anxiety; Primary insomnia; Depression, major, single episode, severe (HCC); Cervicalgia; Chronic dental pain; Opioid dependence (HCC); B12 deficiency; Urinary incontinence; Flank pain; Uric acid crystalluria; Right sided sciatica; Intertrigo; Chronic bilateral low back pain with sciatica; Chronic right hip pain; Tachycardia; Drug-induced constipation; and Grief reaction with prolonged bereavement on their problem list.  History of Present Illness Laurie Roman is a 48 year old female who presents with ongoing pain management issues following a work-related injury.  She sustained a work-related injury after falling and injuring her hip and back, leading to significant pain. She has been on light duty at work, which exacerbates her pain, especially when using a stool to move around the lab. Her pain is severe, affecting her ability to walk and perform her job duties. She has been prescribed Suboxone  in the past but reports it does not alleviate her pain and has stopped taking it. Her worker's comp doctor is unwilling to prescribe pain medication due to her Suboxone  use, despite her not taking it currently. There have been delays in scheduling and approval for a CT scan for her hip and back due to  insurance and worker's comp issues. She has been using various medications, including Robaxin  and gabapentin , but reports limited relief. She has also tried lidocaine  cream and patches for localized pain relief.  She experiences daily headaches since the fall, which she attributes to hitting her head during the incident. She has a history of migraines but notes that the current headaches are different, feeling like a 'vice grip' on her head. She has been using a migraine shot regularly, which previously controlled her migraines, but the headaches have persisted since the fall. She experiences these headaches daily, which she associates with increased blood pressure during pain episodes. She has not had an MRI yet, and there is ongoing difficulty in obtaining necessary diagnostic imaging through worker's comp.  She expresses frustration with the worker's comp process, noting that she has not been paid for time off due to her injury unless she misses eight consecutive days. She feels unsupported by her employer and is considering legal action to address her concerns.  She is also interested in knowing if would write hydrocodone , as suboxone  that I have historically prescribed does not help, and her Primary Care Provider (PCP) told her she doesn't want her to get back on it again, and the workers compensation physician told her she would have to get from Primary Care Provider (PCP).     Visually reviewed that patient  has a past medical history of ADD (attention deficit disorder), Anxiety, Asthma, Blood transfusion without reported diagnosis, Depression, GERD (gastroesophageal reflux disease), IBS (irritable bowel syndrome), Insomnia, Lumbago, Migraine, Opioid dependence (HCC) (08/21/2022), Other chest pain (08/17/2022), Sciatica, Sleep apnea, Tietze's disease, and Urinary incontinence (10/26/2022). Manually updated: No problems updated. Verbally reviewed (per [employer]-generated extraction):  Past  Medical  History - Hip pain - Back pain - Headache Medications - Suboxone  - Toradol  shot Results RADIOLOGY Lumbar spine x-ray: Normal (08/02/2023)  Visually reviewed and manually updated: Current Outpatient Medications on File Prior to Visit  Medication Sig   albuterol  (VENTOLIN  HFA) 108 (90 Base) MCG/ACT inhaler Inhale 2 puffs into the lungs every 6 (six) hours as needed for wheezing or shortness of breath.   amitriptyline  (ELAVIL ) 25 MG tablet Take 1 tablet (25 mg total) by mouth at bedtime as needed for sleep.   amLODipine  (NORVASC ) 10 MG tablet Take 1 tablet (10 mg total) by mouth daily. Replaces amlodipine  5, for uncontrolled blood pressure.   amphetamine -dextroamphetamine  (ADDERALL) 20 MG tablet Take 1 tablet (20 mg total) by mouth 2 (two) times daily.   ARIPiprazole  (ABILIFY ) 2 MG tablet Take 1 tablet (2 mg total) by mouth daily as needed.   Buprenorphine  HCl-Naloxone  HCl (SUBOXONE ) 8-2 MG FILM Place 2 Film under the tongue daily at 6 (six) AM. At first, take only 0.25  films (2 mg opioid) each hour until minimum effective dose reached to control cravings, up to 2 films total in day max.   chlorhexidine  (PERIDEX ) 0.12 % solution Use as directed 15 mLs in the mouth or throat 2 (two) times daily.   ciclopirox  (LOPROX ) 0.77 % cream Apply topically 2 (two) times daily.   Cyanocobalamin  (B-12) 1000 MCG SUBL Place 1 tablet under the tongue daily at 6 (six) AM.   Erenumab -aooe (AIMOVIG ) 140 MG/ML SOAJ Inject 140 mg into the skin every 30 (thirty) days.   fluconazole  (DIFLUCAN ) 150 MG tablet Take 1 tablet (150 mg total) by mouth every three (3) days as needed.   fluticasone  (FLONASE ) 50 MCG/ACT nasal spray Place 1 spray into both nostrils daily.   gabapentin  (NEURONTIN ) 600 MG tablet Take 1 tablet (600 mg total) by mouth 3 (three) times daily.   gabapentin  (NEURONTIN ) 800 MG tablet Take 1 tablet (800 mg total) by mouth 4 (four) times daily as needed. Replaces prior dosing schedule.   ketorolac   (TORADOL ) 10 MG tablet Take 1 tablet (10 mg total) by mouth every 6 (six) hours as needed.   lidocaine  (XYLOCAINE ) 5 % ointment Apply 1 Application topically as needed.   linaclotide  (LINZESS ) 145 MCG CAPS capsule Take 1 capsule (145 mcg total) by mouth daily before breakfast.   losartan  (COZAAR ) 100 MG tablet Take 1 tablet (100 mg total) by mouth daily.   methocarbamol  (ROBAXIN ) 500 MG tablet Take by mouth.   methylPREDNISolone  (MEDROL  DOSEPAK) 4 MG TBPK tablet See admin instructions.   metoprolol  tartrate (LOPRESSOR ) 25 MG tablet Take 1 tablet (25 mg total) by mouth 2 (two) times daily.   mometasone -formoterol  (DULERA ) 100-5 MCG/ACT AERO Inhale 2 puffs into the lungs 2 (two) times daily.   nystatin  (MYCOSTATIN ) 100000 UNIT/ML suspension Take 5 mLs (500,000 Units total) by mouth 4 (four) times daily. Swish and hold in mouth few seconds and swallow   nystatin  (MYCOSTATIN /NYSTOP ) powder Apply 1 Application topically 3 (three) times daily.   nystatin  cream (MYCOSTATIN ) Apply 1 Application topically 2 (two) times daily.   ondansetron  (ZOFRAN ) 4 MG tablet Take 1 tablet (4 mg total) by mouth every 8 (eight) hours as needed for nausea or vomiting.   pantoprazole  (PROTONIX ) 40 MG tablet Take 1 tablet (40 mg total) by mouth 2 (two) times daily before a meal.   phenazopyridine  (PYRIDIUM ) 200 MG tablet Take 200 mg by mouth 3 (three) times daily.   potassium chloride  (KLOR-CON  M) 20  MEQ tablet Take 1 tablet (20 mEq total) by mouth daily.   Probiotic Product (PROBIOTIC DAILY) CAPS Take 1 capsule by mouth daily.   promethazine  (PHENERGAN ) 25 MG tablet Take 25 mg by mouth every 6 (six) hours as needed.   rizatriptan  (MAXALT ) 10 MG tablet Take 1 tablet (10 mg total) by mouth as needed for migraine. May repeat in 2 hours if needed   tiZANidine  (ZANAFLEX ) 4 MG tablet Take 1 tablet (4 mg total) by mouth every 6 (six) hours as needed for muscle spasms.   trimethoprim  (TRIMPEX ) 100 MG tablet Take 100 mg by mouth  daily.   Current Facility-Administered Medications on File Prior to Visit  Medication   ondansetron  (ZOFRAN -ODT) disintegrating tablet 4 mg  There are no discontinued medications.    Objective      08/11/2023    5:38 PM 08/11/2023    4:02 PM 07/07/2023    4:19 PM  Vitals with BMI  Height  4' 11   Weight  135 lbs   BMI  27.25   Systolic 139 145 869  Diastolic 87 91 84  Pulse  94    Wt Readings from Last 10 Encounters:  08/11/23 135 lb (61.2 kg)  07/07/23 149 lb 6 oz (67.8 kg)  06/16/23 138 lb 15.7 oz (63 kg)  05/18/23 142 lb 6 oz (64.6 kg)  05/10/23 141 lb (64 kg)  04/21/23 141 lb 9.6 oz (64.2 kg)  04/19/23 141 lb 9.6 oz (64.2 kg)  04/02/23 141 lb 3.2 oz (64 kg)  03/26/23 141 lb 9.6 oz (64.2 kg)  03/24/23 145 lb (65.8 kg)   Vital signs reviewed.  Nursing notes reviewed. Weight trend reviewed. Physical Exam  Physical Exam General Appearance:  No acute distress appreciable.   Well-groomed, healthy-appearing female.  Well proportioned with no abnormal fat distribution.  Good muscle tone.  She is able to walk upright with some discomfort apparent. Pulmonary:  Normal work of breathing at rest, no respiratory distress apparent. SpO2: 95 %  Musculoskeletal: All extremities are intact.  Neurological:  Awake, alert, oriented, and engaged.  No obvious focal neurological deficits or cognitive impairments.  Sensorium seems unclouded.   Speech is clear and coherent with logical content. Psychiatric:  Appropriate mood, pleasant and cooperative demeanor, thoughtful and engaged during the exam    No results found for any visits on 08/11/23. Lab on 06/25/2023  Component Date Value   WBC 06/25/2023 8.7    RBC 06/25/2023 3.61 (L)    Hemoglobin 06/25/2023 11.0 (L)    HCT 06/25/2023 32.5 (L)    MCV 06/25/2023 90.0    MCHC 06/25/2023 33.8    RDW 06/25/2023 13.6    Platelets 06/25/2023 334.0    Neutrophils Relative % 06/25/2023 51.7    Lymphocytes Relative 06/25/2023 39.4    Monocytes  Relative 06/25/2023 6.4    Eosinophils Relative 06/25/2023 2.1    Basophils Relative 06/25/2023 0.4    Neutro Abs 06/25/2023 4.5    Lymphs Abs 06/25/2023 3.4    Monocytes Absolute 06/25/2023 0.6    Eosinophils Absolute 06/25/2023 0.2    Basophils Absolute 06/25/2023 0.0    Sodium 06/25/2023 139    Potassium 06/25/2023 3.9    Chloride 06/25/2023 101    CO2 06/25/2023 29    Glucose, Bld 06/25/2023 96    BUN 06/25/2023 9    Creatinine, Ser 06/25/2023 0.59    Total Bilirubin 06/25/2023 0.2    Alkaline Phosphatase 06/25/2023 74    AST 06/25/2023 19  ALT 06/25/2023 15    Total Protein 06/25/2023 7.1    Albumin 06/25/2023 4.6    GFR 06/25/2023 107.10    Calcium 06/25/2023 9.4    Folate 06/25/2023 8.1    Iron  06/25/2023 70    Transferrin 06/25/2023 269.0    Saturation Ratios 06/25/2023 18.6 (L)    Ferritin 06/25/2023 18.0    TIBC 06/25/2023 376.6   Office Visit on 05/10/2023  Component Date Value   Color, Urine 05/10/2023 YELLOW    APPearance 05/10/2023 CLOUDY (A)    Specific Gravity, Urine 05/10/2023 1.021    pH 05/10/2023 5.5    Glucose, UA 05/10/2023 NEGATIVE    Bilirubin Urine 05/10/2023 NEGATIVE    Ketones, ur 05/10/2023 NEGATIVE    Hgb urine dipstick 05/10/2023 TRACE (A)    Protein, ur 05/10/2023 NEGATIVE    Nitrites, Initial 05/10/2023 POSITIVE (A)    Leukocyte Esterase 05/10/2023 TRACE (A)    WBC, UA 05/10/2023 6-10 (A)    RBC / HPF 05/10/2023 3-10 (A)    Squamous Epithelial / HPF 05/10/2023 10-20 (A)    Bacteria, UA 05/10/2023 MANY (A)    Hyaline Cast 05/10/2023 NONE SEEN    Yeast 05/10/2023 FEW (A)    Note 05/10/2023     Color, UA 05/10/2023 yellow    Clarity, UA 05/10/2023 cloudy    Glucose, UA 05/10/2023 Negative    Bilirubin, UA 05/10/2023 Negative    Ketones, UA 05/10/2023 Negative    Spec Grav, UA 05/10/2023 >=1.030 (A)    Blood, UA 05/10/2023 Negative    pH, UA 05/10/2023 5.5    Protein, UA 05/10/2023 Negative    Urobilinogen, UA 05/10/2023 0.2     Nitrite, UA 05/10/2023 Positive    Leukocytes, UA 05/10/2023 Negative    MICRO NUMBER: 05/10/2023 [id number]    SPECIMEN QUALITY: 05/10/2023 Adequate    Sample Source 05/10/2023 URINE    STATUS: 05/10/2023 FINAL    ISOLATE 1: 05/10/2023 Escherichia coli (A)    REFLEXIVE URINE CULTURE 05/10/2023    Office Visit on 04/19/2023  Component Date Value   MICRO NUMBER: 04/19/2023 [id number]    SPECIMEN QUALITY: 04/19/2023 Adequate    Sample Source 04/19/2023 NOT GIVEN    STATUS: 04/19/2023 FINAL    Result: 04/19/2023                     Value:Mixed genital flora isolated. These superficial bacteria are not indicative of a urinary tract infection. No further organism identification is warranted on this specimen. If clinically indicated, recollect clean-catch, mid-stream urine and transfer  immediately to Urine Culture Transport Tube.    Color, UA 04/19/2023 yellow    Clarity, UA 04/19/2023 cloudy    Glucose, UA 04/19/2023 Negative    Bilirubin, UA 04/19/2023 Negative    Ketones, UA 04/19/2023 Negative    Spec Grav, UA 04/19/2023 >=1.030 (A)    Blood, UA 04/19/2023 Positive    pH, UA 04/19/2023 5.0    Protein, UA 04/19/2023 Positive (A)    Urobilinogen, UA 04/19/2023 0.2    Nitrite, UA 04/19/2023 Negative    Leukocytes, UA 04/19/2023 Moderate (2+) (A)    Iron  04/19/2023 24 (L)    TIBC 04/19/2023 326    %SAT 04/19/2023 7 (L)    Ferritin 04/19/2023 25    Glucose, Bld 04/19/2023 76    BUN 04/19/2023 20    Creat 04/19/2023 0.64    BUN/Creatinine Ratio 04/19/2023 SEE NOTE:    Sodium 04/19/2023 139    Potassium 04/19/2023 4.7  Chloride 04/19/2023 103    CO2 04/19/2023 23    Calcium 04/19/2023 9.2    Total Protein 04/19/2023 7.0    Albumin 04/19/2023 4.4    Globulin 04/19/2023 2.6    AG Ratio 04/19/2023 1.7    Total Bilirubin 04/19/2023 0.2    Alkaline phosphatase (AP* 04/19/2023 77    AST 04/19/2023 16    ALT 04/19/2023 12    WBC 04/19/2023 12.9 (H)    RBC 04/19/2023 3.51 (L)     Hemoglobin 04/19/2023 10.7 (L)    HCT 04/19/2023 32.4 (L)    MCV 04/19/2023 92.3    MCH 04/19/2023 30.5    MCHC 04/19/2023 33.0    RDW 04/19/2023 12.5    Platelets 04/19/2023 290    MPV 04/19/2023 12.6 (H)    Neutro Abs 04/19/2023 8,940 (H)    Absolute Lymphocytes 04/19/2023 2,619    Absolute Monocytes 04/19/2023 1,109 (H)    Eosinophils Absolute 04/19/2023 168    Basophils Absolute 04/19/2023 65    Neutrophils Relative % 04/19/2023 69.3    Total Lymphocyte 04/19/2023 20.3    Monocytes Relative 04/19/2023 8.6    Eosinophils Relative 04/19/2023 1.3    Basophils Relative 04/19/2023 0.5    Smear Review 04/19/2023     Color, Urine 04/19/2023 YELLOW    APPearance 04/19/2023 CLOUDY (A)    Specific Gravity, Urine 04/19/2023 1.025    pH 04/19/2023 < OR = 5.0    Glucose, UA 04/19/2023 NEGATIVE    Bilirubin Urine 04/19/2023 NEGATIVE    Ketones, ur 04/19/2023 NEGATIVE    Hgb urine dipstick 04/19/2023 NEGATIVE    Protein, ur 04/19/2023 TRACE (A)    Nitrite 04/19/2023 POSITIVE (A)    Leukocytes,Ua 04/19/2023 2+ (A)    WBC, UA 04/19/2023 40-60 (A)    RBC / HPF 04/19/2023 0-2    Squamous Epithelial / HPF 04/19/2023 10-20 (A)    Bacteria, UA 04/19/2023 MANY (A)    Calcium Oxalate Crystal 04/19/2023 MANY (A)    Hyaline Cast 04/19/2023 0-5 (A)    Vitamin B-12 04/19/2023 644    Folate 04/19/2023 5.0 (L)    Note 04/19/2023    Orders Only on 03/31/2023  Component Date Value   Glucose, Bld 03/31/2023 97    BUN 03/31/2023 14    Creat 03/31/2023 0.64    BUN/Creatinine Ratio 03/31/2023 SEE NOTE:    Sodium 03/31/2023 140    Potassium 03/31/2023 3.7    Chloride 03/31/2023 99    CO2 03/31/2023 26    Calcium 03/31/2023 10.2    Total Protein 03/31/2023 8.3 (H)    Albumin 03/31/2023 4.8    Globulin 03/31/2023 3.5    AG Ratio 03/31/2023 1.4    Total Bilirubin 03/31/2023 0.3    Alkaline phosphatase (AP* 03/31/2023 74    AST 03/31/2023 17    ALT 03/31/2023 9    Vitamin B-12 03/31/2023 865     WBC 03/31/2023 13.7 (H)    RBC 03/31/2023 3.94    Hemoglobin 03/31/2023 12.1    HCT 03/31/2023 36.9    MCV 03/31/2023 93.7    MCH 03/31/2023 30.7    MCHC 03/31/2023 32.8    RDW 03/31/2023 12.3    Platelets 03/31/2023 381    MPV 03/31/2023 11.2    Neutro Abs 03/31/2023 9,302 (H)    Absolute Lymphocytes 03/31/2023 3,192    Absolute Monocytes 03/31/2023 877    Eosinophils Absolute 03/31/2023 247    Basophils Absolute 03/31/2023 82    Neutrophils Relative % 03/31/2023 67.9  Total Lymphocyte 03/31/2023 23.3    Monocytes Relative 03/31/2023 6.4    Eosinophils Relative 03/31/2023 1.8    Basophils Relative 03/31/2023 0.6   Office Visit on 03/26/2023  Component Date Value   Path Review 03/31/2023 8   Clinical Support on 03/03/2023  Component Date Value   WBC 03/05/2023 12.1 (H)    RBC 03/05/2023 3.97    Hemoglobin 03/05/2023 12.2    HCT 03/05/2023 36.2    MCV 03/05/2023 91.2    MCH 03/05/2023 30.7    MCHC 03/05/2023 33.7    RDW 03/05/2023 12.8    Platelets 03/05/2023 399    MPV 03/05/2023 11.2    Neutro Abs 03/05/2023 8,797 (H)    Lymphs Abs 03/05/2023 2,190    Absolute Monocytes 03/05/2023 883    Eosinophils Absolute 03/05/2023 145    Basophils Absolute 03/05/2023 85    Neutrophils Relative % 03/05/2023 72.7    Total Lymphocyte 03/05/2023 18.1    Monocytes Relative 03/05/2023 7.3    Eosinophils Relative 03/05/2023 1.2    Basophils Relative 03/05/2023 0.7   Office Visit on 02/26/2023  Component Date Value   Color, UA 02/26/2023 YELLOW    Clarity, UA 02/26/2023 CLOUDY    Glucose, UA 02/26/2023 Negative    Bilirubin, UA 02/26/2023 NEG    Ketones, UA 02/26/2023 NEG    Spec Grav, UA 02/26/2023 1.020    Blood, UA 02/26/2023 NEG    pH, UA 02/26/2023 6.0    Protein, UA 02/26/2023 Negative    Urobilinogen, UA 02/26/2023 0.2    Nitrite, UA 02/26/2023 POS    Leukocytes, UA 02/26/2023 Negative   Office Visit on 02/22/2023  Component Date Value   D-Dimer, Quant 02/22/2023  0.29    Troponin I 02/22/2023 <3    Color, Urine 02/22/2023 YELLOW    APPearance 02/22/2023 CLEAR    Specific Gravity, Urine 02/22/2023 1.015    pH 02/22/2023 6.0    Total Protein, Urine 02/22/2023 NEGATIVE    Urine Glucose 02/22/2023 NEGATIVE    Ketones, ur 02/22/2023 NEGATIVE    Bilirubin Urine 02/22/2023 NEGATIVE    Hgb urine dipstick 02/22/2023 NEGATIVE    Urobilinogen, UA 02/22/2023 0.2    Leukocytes,Ua 02/22/2023 NEGATIVE    Nitrite 02/22/2023 NEGATIVE    WBC, UA 02/22/2023 0-2/hpf    RBC / HPF 02/22/2023 0-2/hpf    Squamous Epithelial / HPF 02/22/2023 Few(5-10/hpf) (A)    Bacteria, UA 02/22/2023 Rare(<10/hpf) (A)    WBC 02/22/2023 13.3 (H)    RBC 02/22/2023 4.18    Hemoglobin 02/22/2023 12.5    HCT 02/22/2023 38.9    MCV 02/22/2023 93.1    MCHC 02/22/2023 32.1    RDW 02/22/2023 13.5    Platelets 02/22/2023 374.0    Neutrophils Relative % 02/22/2023 68.3    Lymphocytes Relative 02/22/2023 22.4    Monocytes Relative 02/22/2023 8.3    Eosinophils Relative 02/22/2023 0.5    Basophils Relative 02/22/2023 0.5    Neutro Abs 02/22/2023 9.1 (H)    Lymphs Abs 02/22/2023 3.0    Monocytes Absolute 02/22/2023 1.1 (H)    Eosinophils Absolute 02/22/2023 0.1    Basophils Absolute 02/22/2023 0.1    Sodium 02/22/2023 139    Potassium 02/22/2023 3.9    Chloride 02/22/2023 101    CO2 02/22/2023 28    Glucose, Bld 02/22/2023 84    BUN 02/22/2023 11    Creatinine, Ser 02/22/2023 0.55    Total Bilirubin 02/22/2023 0.2    Alkaline Phosphatase 02/22/2023 72  AST 02/22/2023 19    ALT 02/22/2023 16    Total Protein 02/22/2023 7.9    Albumin 02/22/2023 4.8    GFR 02/22/2023 109.19    Calcium 02/22/2023 9.9    Color, UA 02/22/2023 yellow    Clarity, UA 02/22/2023 cloudy    Glucose, UA 02/22/2023 Negative    Bilirubin, UA 02/22/2023 Negative    Ketones, UA 02/22/2023 Negative    Spec Grav, UA 02/22/2023 1.020    Blood, UA 02/22/2023 Negative    pH, UA 02/22/2023 6.0    Protein,  UA 02/22/2023 Negative    Urobilinogen, UA 02/22/2023 0.2    Nitrite, UA 02/22/2023 Negative    Leukocytes, UA 02/22/2023 Negative    High Sens Troponin I 02/22/2023 4    POC Glucose 02/22/2023 107 (A)   Admission on 02/16/2023, Discharged on 02/16/2023  Component Date Value   WBC 02/16/2023 13.6 (H)    RBC 02/16/2023 3.55 (L)    Hemoglobin 02/16/2023 11.0 (L)    HCT 02/16/2023 33.0 (L)    MCV 02/16/2023 93.0    MCH 02/16/2023 31.0    MCHC 02/16/2023 33.3    RDW 02/16/2023 12.5    Platelets 02/16/2023 250    nRBC 02/16/2023 0.0    Color, Urine 02/16/2023 YELLOW (A)    APPearance 02/16/2023 CLOUDY (A)    Specific Gravity, Urine 02/16/2023 1.016    pH 02/16/2023 5.0    Glucose, UA 02/16/2023 NEGATIVE    Hgb urine dipstick 02/16/2023 NEGATIVE    Bilirubin Urine 02/16/2023 NEGATIVE    Ketones, ur 02/16/2023 NEGATIVE    Protein, ur 02/16/2023 NEGATIVE    Nitrite 02/16/2023 NEGATIVE    Leukocytes,Ua 02/16/2023 TRACE (A)    RBC / HPF 02/16/2023 0-5    WBC, UA 02/16/2023 6-10    Bacteria, UA 02/16/2023 RARE (A)    Squamous Epithelial / HPF 02/16/2023 21-50    Mucus 02/16/2023 PRESENT    Troponin I (High Sensiti* 02/16/2023 <2    Sodium 02/16/2023 137    Potassium 02/16/2023 3.4 (L)    Chloride 02/16/2023 102    CO2 02/16/2023 24    Glucose, Bld 02/16/2023 132 (H)    BUN 02/16/2023 15    Creatinine, Ser 02/16/2023 0.54    Calcium 02/16/2023 8.8 (L)    Total Protein 02/16/2023 6.9    Albumin 02/16/2023 4.1    AST 02/16/2023 23    ALT 02/16/2023 15    Alkaline Phosphatase 02/16/2023 54    Total Bilirubin 02/16/2023 0.6    GFR, Estimated 02/16/2023 >60    Anion gap 02/16/2023 11    Troponin I (High Sensiti* 02/16/2023 3   Office Visit on 02/11/2023  Component Date Value   Color, UA 02/11/2023 Yellow    Clarity, UA 02/11/2023 clear    Glucose, UA 02/11/2023 Negative    Bilirubin, UA 02/11/2023 negative    Ketones, UA 02/11/2023 15    Spec Grav, UA 02/11/2023 1.025     Blood, UA 02/11/2023 negative    pH, UA 02/11/2023 5.0    Protein, UA 02/11/2023 Negative    Urobilinogen, UA 02/11/2023 0.2    Nitrite, UA 02/11/2023 negative    Leukocytes, UA 02/11/2023 Trace (A)    MICRO NUMBER: 02/11/2023 [id number]    SPECIMEN QUALITY: 02/11/2023 Adequate    Sample Source 02/11/2023 URINE    STATUS: 02/11/2023 FINAL    Result: 02/11/2023  Value:Mixed genital flora isolated. These superficial bacteria are not indicative of a urinary tract infection. No further organism identification is warranted on this specimen. If clinically indicated, recollect clean-catch, mid-stream urine and transfer  immediately to Urine Culture Transport Tube.    WBC, UA 02/11/2023 0-2/hpf    RBC / HPF 02/11/2023 0-2/hpf    Mucus, UA 02/11/2023 Presence of (A)    Squamous Epithelial / HPF 02/11/2023 Few(5-10/hpf) (A)    Hyaline Casts, UA 02/11/2023 Presence of (A)   There may be more visits with results that are not included.  No image results found. DG Lumbar Spine Complete Result Date: 08/03/2023 CLINICAL DATA:  Lumbar back pain from a fall EXAM: LUMBAR SPINE - COMPLETE 4+ VIEW COMPARISON:  09/06/2013 FINDINGS: There is no evidence of lumbar spine fracture. Alignment is normal. Intervertebral disc spaces are maintained. IMPRESSION: Negative. Electronically Signed   By: JONETTA Faes M.D.   On: 08/03/2023 12:38       Assessment & Plan Migraine without status migrainosus, not intractable, unspecified migraine type She experiences daily headaches since the fall, potentially related to increased intracranial pressure from disc herniation, differing from previous migraines. Order a CT scan to rule out a brain bleed. Prescribe Nurtec for headache management. Refer to a neurologist if the CT scan is clean but headaches persist. Chronic bilateral low back pain with bilateral sciatica Severe, persistent hip and back pain following a fall affects her ability to perform light duty  work. Suboxone  is ineffective and interferes with other pain medications. Discontinue Suboxone  and urgently refer to a pain clinic. I am not comfortable writing for hydrocodone  due to its risks (I've tried to limit my prescribing to less risky suboxone ), but have never diagnosed patient with opioid use disorder, so I am supporting pain specialist in writing of this medication(s) if felt appropriate.  I had tried her on suboxone  due to difficulty weaning from hydrocodone  for chronic pain in past.  An MRI is needed to assess for potential disc herniation, which may require microdiscectomy or injections. Prescribe Toradol  pills and diclofenac (Voltaren) if tolerated. Prescribe 10% lidocaine  cream and 4% lidocaine  patch. Attempt to prescribe Susetragene and advocate for insurance coverage. Physical therapy or other interventions may be needed based on MRI results. General Health Maintenance Advise her to ensure thorough medical documentation and seek legal counsel for her worker's comp case. Document all medical visits and symptoms thoroughly.  Follow-up Follow up with emergency ortho on March 7th. Message the referral coordinator to expedite the pain clinic referral.       Orders Placed During this Encounter:   Orders Placed This Encounter  Procedures   Ambulatory referral to Pain Clinic    Referral Priority:   Urgent    Referral Reason:   Specialty Services Required    Number of Visits Requested:   1   Meds ordered this encounter  Medications   Rimegepant Sulfate (NURTEC) 75 MG TBDP    Sig: Take 1 tablet (75 mg total) by mouth once a week.    Dispense:  30 tablet    Refill:  2   Lidocaine  10 % CREA    Sig: Apply 1 Application topically daily at 6 (six) AM.    Dispense:  30 g    Refill:  11   lidocaine  (HM LIDOCAINE  PATCH) 4 %    Sig: Place 1 patch onto the skin daily.    Dispense:  30 patch    Refill:  1   NONFORMULARY OR COMPOUNDED  ITEM    Sig: Recommended starting JOURNAVX oral dose  is 100 mg. Take the starting  dose on an empty stomach at least 1 hour before or 2 hours after food.  Clear liquids may be consumed during this time (e.g., water, apple juice,  vegetable broth, tea, black coffee). (2.1)   Starting 12 hours after the starting dose, take 50 mg of JOURNAVX orally  every 12 hours. Take these doses with or without food. (2.1)  Use JOURNAVX for the shortest duration, consistent with individual  patient treatment goals. Use of JOURNAVX for the treatment of acute pain  has not been studied beyond 14 days.  28 DAY SUPPLY    Dispense:  3 each    Refill:  3       **This document was synthesized by artificial intelligence (Abridge) using HIPAA-compliant recording of the clinical interaction;   We discussed the use of AI scribe software for clinical note transcription with the patient, who gave verbal consent to proceed.    Additional Info: This encounter employed state-of-the-art, real-time, collaborative documentation. The patient actively reviewed and assisted in updating their electronic medical record on a shared screen, ensuring transparency and facilitating joint problem-solving for the problem list, overview, and plan. This approach promotes accurate, informed care. The treatment plan was discussed and reviewed in detail, including medication safety, potential side effects, and all patient questions. We confirmed understanding and comfort with the plan. Follow-up instructions were established, including contacting the office for any concerns, returning if symptoms worsen, persist, or new symptoms develop, and precautions for potential emergency department visits.

## 2023-08-12 NOTE — Assessment & Plan Note (Signed)
 Severe, persistent hip and back pain following a fall affects her ability to perform light duty work. Suboxone is ineffective and interferes with other pain medications. Discontinue Suboxone and urgently refer to a pain clinic. I am not comfortable writing for hydrocodone due to its risks (I've tried to limit my prescribing to less risky suboxone), but have never diagnosed patient with opioid use disorder, so I am supporting pain specialist in writing of this medication(s) if felt appropriate.  I had tried her on suboxone due to difficulty weaning from hydrocodone for chronic pain in past.  An MRI is needed to assess for potential disc herniation, which may require microdiscectomy or injections. Prescribe Toradol pills and diclofenac (Voltaren) if tolerated. Prescribe 10% lidocaine cream and 4% lidocaine patch. Attempt to prescribe Susetragene and advocate for insurance coverage. Physical therapy or other interventions may be needed based on MRI results.

## 2023-08-12 NOTE — Assessment & Plan Note (Signed)
 She experiences daily headaches since the fall, potentially related to increased intracranial pressure from disc herniation, differing from previous migraines. Order a CT scan to rule out a brain bleed. Prescribe Nurtec for headache management. Refer to a neurologist if the CT scan is clean but headaches persist.

## 2023-08-12 NOTE — Patient Instructions (Signed)
 VISIT SUMMARY:  Today, we discussed your ongoing pain management issues following your work-related injury. We reviewed your current medications and symptoms, and we have made some changes to your treatment plan to better address your pain and headaches.  YOUR PLAN:  -CHRONIC PAIN (HIP AND BACK): Chronic pain in your hip and back is likely due to your fall and may involve issues like disc herniation. We will discontinue Suboxone as it is not effective for you and refer you urgently to a pain clinic. An MRI is needed to check for potential disc herniation, which might require surgery or injections. We have prescribed Toradol pills, diclofenac (Voltaren), 10% lidocaine cream, and 4% lidocaine patches to help manage your pain. We will also try to prescribe Susetragene and advocate for insurance coverage. Physical therapy or other treatments may be needed based on the MRI results.  -HEADACHES: Your daily headaches since the fall may be related to increased pressure in your head from a possible disc herniation. We will order a CT scan to rule out any serious issues like a brain bleed. For headache management, we have prescribed Nurtec. If the CT scan is clear but your headaches continue, we will refer you to a neurologist.  -GENERAL HEALTH MAINTENANCE: It is important to document all your medical visits and symptoms thoroughly. We also recommend seeking legal counsel to address your worker's compensation case and ensure you receive the support you need.  INSTRUCTIONS:  Please follow up with emergency ortho on March 7th. We will also message the referral coordinator to expedite your pain clinic referral.  For more information, you can read your full clinical note, available in your patient portal.  It was a pleasure seeing you today! Your health and satisfaction are our top priorities.  Glenetta Hew, MD  Your Providers PCP: Jeani Sow, MD,  [phone]) Referring Provider: Jeani Sow,  MD,  [phone]) Care Team Provider: Shaune Leeks     NEXT STEPS: [x]  Early Intervention: Schedule sooner appointment, call our on-call services, or go to emergency room if there is any significant Increase in pain or discomfort New or worsening symptoms Sudden or severe changes in your health [x]  Flexible Follow-Up: We recommend a No follow-ups on file. for optimal routine care. This allows for progress monitoring and treatment adjustments. [x]  Preventive Care: Schedule your annual preventive care visit! It's typically covered by insurance and helps identify potential health issues early. [x]  Lab & X-ray Appointments: Incomplete tests scheduled today, or call to schedule. X-rays: [facility] (M-F, 8:30am-noon or 1pm-5pm). [x]  Medical Information Release: Sign a release form at front desk to obtain relevant medical information we don't have.  MAKING THE MOST OF OUR FOCUSED 20 MINUTE APPOINTMENTS: [x]   Clearly state your top concerns at the beginning of the visit to focus our discussion [x]   If you anticipate you will need more time, please inform the front desk during scheduling - we can book multiple appointments in the same week. [x]   If you have transportation problems- use our convenient video appointments or ask about transportation support. [x]   We can get down to business faster if you use MyChart to update information before the visit and submit non-urgent questions before your visit. Thank you for taking the time to provide details through MyChart.  Let our nurse know and she can import this information into your encounter documents.  Arrival and Wait Times: [x]   Arriving on time ensures that everyone receives prompt attention. [x]   Early  morning (8a) and afternoon (1p) appointments tend to have shortest wait times. [x]   Unfortunately, we cannot delay appointments for late arrivals or hold slots during phone calls.  Getting Answers and Following Up [x]    Simple Questions & Concerns: For quick questions or basic follow-up after your visit, reach [place] at [phone] or MyChart messaging. [x]   Complex Concerns: If your concern is more complex, scheduling an appointment might be best. Discuss this with the staff to find the most suitable option. [x]   Lab & Imaging Results: We'll contact you directly if results are abnormal or you don't use MyChart. Most normal results will be on MyChart within 2-3 business days, with a review message from Dr. Jon Billings. Haven't heard back in 2 weeks? Need results sooner? Contact us at [phone]. [x]   Referrals: Our referral coordinator will manage specialist referrals. The specialist's office should contact you within 2 weeks to schedule an appointment. Call us if you haven't heard from them after 2 weeks.  Staying Connected [x]   MyChart: Activate your MyChart for the fastest way to access results and message [place]. See the last page of this paperwork for instructions on how to activate.  Bring to Your Next Appointment [x]   Medications: Please bring all your medication bottles to your next appointment to ensure we have an accurate record of your prescriptions. [x]   Health Diaries: If you're monitoring any health conditions at home, keeping a diary of your readings can be very helpful for discussions at your next appointment.  Billing [x]   X-ray & Lab Orders: These are billed by separate companies. Contact the invoicing company directly for questions or concerns. [x]   Visit Charges: Discuss any billing inquiries with our administrative services team.  Your Satisfaction Matters [x]   Share Your Experience: We strive for your satisfaction! If you have any complaints, or preferably compliments, please let Dr. Jon Billings know directly or contact our Practice Administrators, Edwena Felty or [employer], by asking at the front desk.   Reviewing Your Records [x]   Review this early draft of your clinical encounter notes  below and the final encounter summary tomorrow on MyChart after its been completed.  All orders placed so far are visible here: Migraine without status migrainosus, not intractable, unspecified migraine type -     Nurtec; Take 1 tablet (75 mg total) by mouth once a week.  Dispense: 30 tablet; Refill: 2  Chronic bilateral low back pain with bilateral sciatica -     Lidocaine; Apply 1 Application topically daily at 6 (six) AM.  Dispense: 30 g; Refill: 11 -     Lidocaine; Place 1 patch onto the skin daily.  Dispense: 30 patch; Refill: 1 -     Ambulatory referral to Pain Clinic -     NONFORMULARY OR COMPOUNDED ITEM; Recommended starting JOURNAVX oral dose is 100 mg. Take the starting  dose on an empty stomach at least 1 hour before or 2 hours after food.  Clear liquids may be consumed during this time (e.g., water, apple juice,  vegetable broth, tea, black coffee). (2.1)   Starting 12 hours after the starting dose, take 50 mg of JOURNAVX orally  every 12 hours. Take these doses with or without food. (2.1)  Use JOURNAVX for the shortest duration, consistent with individual  patient treatment goals. Use of JOURNAVX for the treatment of acute pain  has not been studied beyond 14 days.  28 DAY SUPPLY  Dispense: 3 each; Refill: 3

## 2023-08-13 ENCOUNTER — Other Ambulatory Visit: Payer: Self-pay | Admitting: Family Medicine

## 2023-08-13 DIAGNOSIS — M5431 Sciatica, right side: Secondary | ICD-10-CM

## 2023-08-16 ENCOUNTER — Telehealth: Payer: Self-pay

## 2023-08-16 ENCOUNTER — Other Ambulatory Visit (HOSPITAL_COMMUNITY): Payer: Self-pay

## 2023-08-16 ENCOUNTER — Ambulatory Visit (INDEPENDENT_AMBULATORY_CARE_PROVIDER_SITE_OTHER): Admitting: Internal Medicine

## 2023-08-16 ENCOUNTER — Encounter: Payer: Self-pay | Admitting: Internal Medicine

## 2023-08-16 DIAGNOSIS — N3000 Acute cystitis without hematuria: Secondary | ICD-10-CM

## 2023-08-16 LAB — POCT URINALYSIS DIPSTICK
Glucose, UA: NEGATIVE
Protein, UA: NEGATIVE
Urobilinogen, UA: NEGATIVE U/dL — AB
pH, UA: 6 (ref 5.0–8.0)

## 2023-08-16 MED ORDER — PHENAZOPYRIDINE HCL 200 MG PO TABS: 200.0000 mg | ORAL_TABLET | Freq: Three times a day (TID) | ORAL | 0 refills | Status: DC

## 2023-08-16 MED ORDER — CEPHALEXIN 500 MG PO CAPS: 500.0000 mg | ORAL_CAPSULE | Freq: Three times a day (TID) | ORAL | 0 refills | Status: DC

## 2023-08-16 NOTE — Telephone Encounter (Signed)
 Pharmacy Patient Advocate Encounter   Received notification from Patient Pharmacy that prior authorization for Nurtec 75mg  tabs is required/requested.   Insurance verification completed.   The patient is insured through [facility] MEDICAID .   Per test claim: PA required; PA submitted to above mentioned insurance via CoverMyMeds Key/confirmation #/EOC [id number] Status is pending

## 2023-08-16 NOTE — Progress Notes (Signed)
 ==============================  [facility]: [phone]   -- Medical Office Visit --  Patient: Laurie Roman      Age: 48 y.o.       Sex:  female  Date:   08/16/2023 Today's Healthcare Provider: [name], MD  ==============================   CHIEF COMPLAINT: No chief complaint on file.  SUBJECTIVE: Background This is a 48 y.o. female who has Migraine; ADHD; Essential hypertension; Mild obstructive sleep apnea; Controlled substance agreement signed; Gastroesophageal reflux disease without esophagitis; Left sided sciatica; Asthma; Maxillary sinusitis; Anxiety; Primary insomnia; Depression, major, single episode, severe (HCC); Cervicalgia; Chronic dental pain; Opioid dependence (HCC); B12 deficiency; Urinary incontinence; Flank pain; Uric acid crystalluria; Right sided sciatica; Intertrigo; Chronic bilateral low back pain with sciatica; Chronic right hip pain; Tachycardia; Drug-induced constipation; and Grief reaction with prolonged bereavement on their problem list.  History of Present Illness The patient is a 48 year old with recurrent urinary tract infections who presents with symptoms of a UTI.  Symptoms of a urinary tract infection began yesterday, including frequent urination and malodorous urine. No significant burning during urination is reported. She experiences some back pain and describes tenderness in the kidney area. No fever is reported, but she has not checked her temperature recently.  She has a history of recurrent urinary tract infections, stating she has had 'a lot' of them. Cephalexin has been used in the past to treat her UTIs. A past discussion about possibly needing a bladder sling concluded that it was not necessary. There was a discussion about being on chronic antibiotics to prevent recurrent UTIs, but the prescription was never called in.  She has a history of bladder spasms for which she was given pyridium  She has a  history of being hypersensitive to procedures (cystoscopy), which she attributes to nervousness rather than opioid use. She recalls discomfort during a past procedure with a urologist.  Doesn't plan another bladder sling,has one.   Visually reviewed that patient  has a past medical history of ADD (attention deficit disorder), Anxiety, Asthma, Blood transfusion without reported diagnosis, Depression, GERD (gastroesophageal reflux disease), IBS (irritable bowel syndrome), Insomnia, Lumbago, Migraine, Opioid dependence (HCC) (08/21/2022), Other chest pain (08/17/2022), Sciatica, Sleep apnea, Tietze's disease, and Urinary incontinence (10/26/2022). Manually updated: No problems updated. Verbally reviewed (per Abridge-generated extraction):  Past Medical History - History of urinary tract infections Medications - Keflex - Pyridium  Visually reviewed and manually updated: Current Outpatient Medications on File Prior to Visit  Medication Sig   albuterol (VENTOLIN HFA) 108 (90 Base) MCG/ACT inhaler Inhale 2 puffs into the lungs every 6 (six) hours as needed for wheezing or shortness of breath.   amitriptyline (ELAVIL) 25 MG tablet Take 1 tablet (25 mg total) by mouth at bedtime as needed for sleep.   amLODipine (NORVASC) 10 MG tablet Take 1 tablet (10 mg total) by mouth daily. Replaces amlodipine 5, for uncontrolled blood pressure.   amphetamine-dextroamphetamine (ADDERALL) 20 MG tablet Take 1 tablet (20 mg total) by mouth 2 (two) times daily.   ARIPiprazole (ABILIFY) 2 MG tablet Take 1 tablet (2 mg total) by mouth daily as needed.   Buprenorphine HCl-Naloxone HCl (SUBOXONE) 8-2 MG FILM Place 2 Film under the tongue daily at 6 (six) AM. At first, take only 0.25  films (2 mg opioid) each hour until minimum effective dose reached to control cravings, up to 2 films total in day max.   chlorhexidine (PERIDEX) 0.12 % solution Use as directed 15 mLs  in the mouth or throat 2 (two) times daily.   ciclopirox  (LOPROX) 0.77 % cream Apply topically 2 (two) times daily.   Cyanocobalamin (B-12) 1000 MCG SUBL Place 1 tablet under the tongue daily at 6 (six) AM.   Erenumab-aooe (AIMOVIG) 140 MG/ML SOAJ Inject 140 mg into the skin every 30 (thirty) days.   fluconazole (DIFLUCAN) 150 MG tablet Take 1 tablet (150 mg total) by mouth every three (3) days as needed.   fluticasone (FLONASE) 50 MCG/ACT nasal spray Place 1 spray into both nostrils daily.   gabapentin (NEURONTIN) 600 MG tablet Take 1 tablet (600 mg total) by mouth 3 (three) times daily.   gabapentin (NEURONTIN) 800 MG tablet Take 1 tablet (800 mg total) by mouth 4 (four) times daily as needed. Replaces prior dosing schedule.   ketorolac (TORADOL) 10 MG tablet Take 1 tablet (10 mg total) by mouth every 6 (six) hours as needed.   lidocaine (HM LIDOCAINE PATCH) 4 % Place 1 patch onto the skin daily.   lidocaine (XYLOCAINE) 5 % ointment Apply 1 Application topically as needed.   Lidocaine 10 % CREA Apply 1 Application topically daily at 6 (six) AM.   linaclotide (LINZESS) 145 MCG CAPS capsule Take 1 capsule (145 mcg total) by mouth daily before breakfast.   losartan (COZAAR) 100 MG tablet Take 1 tablet (100 mg total) by mouth daily.   methylPREDNISolone (MEDROL DOSEPAK) 4 MG TBPK tablet See admin instructions.   metoprolol tartrate (LOPRESSOR) 25 MG tablet Take 1 tablet (25 mg total) by mouth 2 (two) times daily.   mometasone-formoterol (DULERA) 100-5 MCG/ACT AERO Inhale 2 puffs into the lungs 2 (two) times daily.   NONFORMULARY OR COMPOUNDED ITEM Recommended starting JOURNAVX oral dose is 100 mg. Take the starting  dose on an empty stomach at least 1 hour before or 2 hours after food.  Clear liquids may be consumed during this time (e.g., water, apple juice,  vegetable broth, tea, black coffee). (2.1)   Starting 12 hours after the starting dose, take 50 mg of JOURNAVX orally  every 12 hours. Take these doses with or without food. (2.1)  Use JOURNAVX  for the shortest duration, consistent with individual  patient treatment goals. Use of JOURNAVX for the treatment of acute pain  has not been studied beyond 14 days.  28 DAY SUPPLY   nystatin (MYCOSTATIN) 100000 UNIT/ML suspension Take 5 mLs (500,000 Units total) by mouth 4 (four) times daily. Swish and hold in mouth few seconds and swallow   nystatin (MYCOSTATIN/NYSTOP) powder Apply 1 Application topically 3 (three) times daily.   nystatin cream (MYCOSTATIN) Apply 1 Application topically 2 (two) times daily.   ondansetron (ZOFRAN) 4 MG tablet Take 1 tablet (4 mg total) by mouth every 8 (eight) hours as needed for nausea or vomiting.   pantoprazole (PROTONIX) 40 MG tablet Take 1 tablet (40 mg total) by mouth 2 (two) times daily before a meal.   potassium chloride (KLOR-CON M) 20 MEQ tablet Take 1 tablet (20 mEq total) by mouth daily.   Probiotic Product (PROBIOTIC DAILY) CAPS Take 1 capsule by mouth daily.   promethazine (PHENERGAN) 25 MG tablet Take 25 mg by mouth every 6 (six) hours as needed.   Rimegepant Sulfate (NURTEC) 75 MG TBDP Take 1 tablet (75 mg total) by mouth once a week.   rizatriptan (MAXALT) 10 MG tablet Take 1 tablet (10 mg total) by mouth as needed for migraine. May repeat in 2 hours if needed   tiZANidine (  ZANAFLEX) 2 MG tablet Take 2 tablets (4 mg total) by mouth every 6 (six) hours as needed for muscle spasms.   trimethoprim (TRIMPEX) 100 MG tablet Take 100 mg by mouth daily.   Current Facility-Administered Medications on File Prior to Visit  Medication   ondansetron (ZOFRAN-ODT) disintegrating tablet 4 mg   Medications Discontinued During This Encounter  Medication Reason   phenazopyridine (PYRIDIUM) 200 MG tablet Reorder      Objective      08/11/2023    5:38 PM 08/11/2023    4:02 PM 07/07/2023    4:19 PM  Vitals with BMI  Height  4\' 11"    Weight  135 lbs   BMI  27.25   Systolic 139 145 454  Diastolic 87 91 84  Pulse  94    Wt Readings from Last 10  Encounters:  08/11/23 135 lb (61.2 kg)  07/07/23 149 lb 6 oz (67.8 kg)  06/16/23 138 lb 15.7 oz (63 kg)  05/18/23 142 lb 6 oz (64.6 kg)  05/10/23 141 lb (64 kg)  04/21/23 141 lb 9.6 oz (64.2 kg)  04/19/23 141 lb 9.6 oz (64.2 kg)  04/02/23 141 lb 3.2 oz (64 kg)  03/26/23 141 lb 9.6 oz (64.2 kg)  03/24/23 145 lb (65.8 kg)   Vital signs reviewed.  Nursing notes reviewed. Weight trend reviewed. Physical Exam  Physical Exam ABDOMEN: Right costovertebral angle tenderness General Appearance:  No acute distress appreciable.   Well-groomed, healthy-appearing female.  Well proportioned with no abnormal fat distribution.  Good muscle tone. Pulmonary:  Normal work of breathing at rest, no respiratory distress apparent.    Musculoskeletal: All extremities are intact.  Neurological:  Awake, alert, oriented, and engaged.  No obvious focal neurological deficits or cognitive impairments.  Sensorium seems unclouded.   Speech is clear and coherent with logical content. Psychiatric:  Appropriate mood, pleasant and cooperative demeanor, thoughtful and engaged during the exam     No results found for any visits on 08/16/23. Lab on 06/25/2023  Component Date Value   WBC 06/25/2023 8.7    RBC 06/25/2023 3.61 (L)    Hemoglobin 06/25/2023 11.0 (L)    HCT 06/25/2023 32.5 (L)    MCV 06/25/2023 90.0    MCHC 06/25/2023 33.8    RDW 06/25/2023 13.6    Platelets 06/25/2023 334.0    Neutrophils Relative % 06/25/2023 51.7    Lymphocytes Relative 06/25/2023 39.4    Monocytes Relative 06/25/2023 6.4    Eosinophils Relative 06/25/2023 2.1    Basophils Relative 06/25/2023 0.4    Neutro Abs 06/25/2023 4.5    Lymphs Abs 06/25/2023 3.4    Monocytes Absolute 06/25/2023 0.6    Eosinophils Absolute 06/25/2023 0.2    Basophils Absolute 06/25/2023 0.0    Sodium 06/25/2023 139    Potassium 06/25/2023 3.9    Chloride 06/25/2023 101    CO2 06/25/2023 29    Glucose, Bld 06/25/2023 96    BUN 06/25/2023 9     Creatinine, Ser 06/25/2023 0.59    Total Bilirubin 06/25/2023 0.2    Alkaline Phosphatase 06/25/2023 74    AST 06/25/2023 19    ALT 06/25/2023 15    Total Protein 06/25/2023 7.1    Albumin 06/25/2023 4.6    GFR 06/25/2023 107.10    Calcium 06/25/2023 9.4    Folate 06/25/2023 8.1    Iron 06/25/2023 70    Transferrin 06/25/2023 269.0    Saturation Ratios 06/25/2023 18.6 (L)    Ferritin 06/25/2023 18.0  TIBC 06/25/2023 376.6   Office Visit on 05/10/2023  Component Date Value   Color, Urine 05/10/2023 YELLOW    APPearance 05/10/2023 CLOUDY (A)    Specific Gravity, Urine 05/10/2023 1.021    pH 05/10/2023 5.5    Glucose, UA 05/10/2023 NEGATIVE    Bilirubin Urine 05/10/2023 NEGATIVE    Ketones, ur 05/10/2023 NEGATIVE    Hgb urine dipstick 05/10/2023 TRACE (A)    Protein, ur 05/10/2023 NEGATIVE    Nitrites, Initial 05/10/2023 POSITIVE (A)    Leukocyte Esterase 05/10/2023 TRACE (A)    WBC, UA 05/10/2023 6-10 (A)    RBC / HPF 05/10/2023 3-10 (A)    Squamous Epithelial / HPF 05/10/2023 10-20 (A)    Bacteria, UA 05/10/2023 MANY (A)    Hyaline Cast 05/10/2023 NONE SEEN    Yeast 05/10/2023 FEW (A)    Note 05/10/2023     Color, UA 05/10/2023 yellow    Clarity, UA 05/10/2023 cloudy    Glucose, UA 05/10/2023 Negative    Bilirubin, UA 05/10/2023 Negative    Ketones, UA 05/10/2023 Negative    Spec Grav, UA 05/10/2023 >=1.030 (A)    Blood, UA 05/10/2023 Negative    pH, UA 05/10/2023 5.5    Protein, UA 05/10/2023 Negative    Urobilinogen, UA 05/10/2023 0.2    Nitrite, UA 05/10/2023 Positive    Leukocytes, UA 05/10/2023 Negative    MICRO NUMBER: 05/10/2023 [id number]    SPECIMEN QUALITY: 05/10/2023 Adequate    Sample Source 05/10/2023 URINE    STATUS: 05/10/2023 FINAL    ISOLATE 1: 05/10/2023 Escherichia coli (A)    REFLEXIVE URINE CULTURE 05/10/2023    Office Visit on 04/19/2023  Component Date Value   MICRO NUMBER: 04/19/2023 [id number]    SPECIMEN QUALITY: 04/19/2023 Adequate     Sample Source 04/19/2023 NOT GIVEN    STATUS: 04/19/2023 FINAL    Result: 04/19/2023                     Value:Mixed genital flora isolated. These superficial bacteria are not indicative of a urinary tract infection. No further organism identification is warranted on this specimen. If clinically indicated, recollect clean-catch, mid-stream urine and transfer  immediately to Urine Culture Transport Tube.    Color, UA 04/19/2023 yellow    Clarity, UA 04/19/2023 cloudy    Glucose, UA 04/19/2023 Negative    Bilirubin, UA 04/19/2023 Negative    Ketones, UA 04/19/2023 Negative    Spec Grav, UA 04/19/2023 >=1.030 (A)    Blood, UA 04/19/2023 Positive    pH, UA 04/19/2023 5.0    Protein, UA 04/19/2023 Positive (A)    Urobilinogen, UA 04/19/2023 0.2    Nitrite, UA 04/19/2023 Negative    Leukocytes, UA 04/19/2023 Moderate (2+) (A)    Iron 04/19/2023 24 (L)    TIBC 04/19/2023 326    %SAT 04/19/2023 7 (L)    Ferritin 04/19/2023 25    Glucose, Bld 04/19/2023 76    BUN 04/19/2023 20    Creat 04/19/2023 0.64    BUN/Creatinine Ratio 04/19/2023 SEE NOTE:    Sodium 04/19/2023 139    Potassium 04/19/2023 4.7    Chloride 04/19/2023 103    CO2 04/19/2023 23    Calcium 04/19/2023 9.2    Total Protein 04/19/2023 7.0    Albumin 04/19/2023 4.4    Globulin 04/19/2023 2.6    AG Ratio 04/19/2023 1.7    Total Bilirubin 04/19/2023 0.2    Alkaline phosphatase (AP* 04/19/2023 77    AST 04/19/2023  16    ALT 04/19/2023 12    WBC 04/19/2023 12.9 (H)    RBC 04/19/2023 3.51 (L)    Hemoglobin 04/19/2023 10.7 (L)    HCT 04/19/2023 32.4 (L)    MCV 04/19/2023 92.3    MCH 04/19/2023 30.5    MCHC 04/19/2023 33.0    RDW 04/19/2023 12.5    Platelets 04/19/2023 290    MPV 04/19/2023 12.6 (H)    Neutro Abs 04/19/2023 8,940 (H)    Absolute Lymphocytes 04/19/2023 2,619    Absolute Monocytes 04/19/2023 1,109 (H)    Eosinophils Absolute 04/19/2023 168    Basophils Absolute 04/19/2023 65    Neutrophils Relative %  04/19/2023 69.3    Total Lymphocyte 04/19/2023 20.3    Monocytes Relative 04/19/2023 8.6    Eosinophils Relative 04/19/2023 1.3    Basophils Relative 04/19/2023 0.5    Smear Review 04/19/2023     Color, Urine 04/19/2023 YELLOW    APPearance 04/19/2023 CLOUDY (A)    Specific Gravity, Urine 04/19/2023 1.025    pH 04/19/2023 < OR = 5.0    Glucose, UA 04/19/2023 NEGATIVE    Bilirubin Urine 04/19/2023 NEGATIVE    Ketones, ur 04/19/2023 NEGATIVE    Hgb urine dipstick 04/19/2023 NEGATIVE    Protein, ur 04/19/2023 TRACE (A)    Nitrite 04/19/2023 POSITIVE (A)    Leukocytes,Ua 04/19/2023 2+ (A)    WBC, UA 04/19/2023 40-60 (A)    RBC / HPF 04/19/2023 0-2    Squamous Epithelial / HPF 04/19/2023 10-20 (A)    Bacteria, UA 04/19/2023 MANY (A)    Calcium Oxalate Crystal 04/19/2023 MANY (A)    Hyaline Cast 04/19/2023 0-5 (A)    Vitamin B-12 04/19/2023 644    Folate 04/19/2023 5.0 (L)    Note 04/19/2023    Orders Only on 03/31/2023  Component Date Value   Glucose, Bld 03/31/2023 97    BUN 03/31/2023 14    Creat 03/31/2023 0.64    BUN/Creatinine Ratio 03/31/2023 SEE NOTE:    Sodium 03/31/2023 140    Potassium 03/31/2023 3.7    Chloride 03/31/2023 99    CO2 03/31/2023 26    Calcium 03/31/2023 10.2    Total Protein 03/31/2023 8.3 (H)    Albumin 03/31/2023 4.8    Globulin 03/31/2023 3.5    AG Ratio 03/31/2023 1.4    Total Bilirubin 03/31/2023 0.3    Alkaline phosphatase (AP* 03/31/2023 74    AST 03/31/2023 17    ALT 03/31/2023 9    Vitamin B-12 03/31/2023 865    WBC 03/31/2023 13.7 (H)    RBC 03/31/2023 3.94    Hemoglobin 03/31/2023 12.1    HCT 03/31/2023 36.9    MCV 03/31/2023 93.7    MCH 03/31/2023 30.7    MCHC 03/31/2023 32.8    RDW 03/31/2023 12.3    Platelets 03/31/2023 381    MPV 03/31/2023 11.2    Neutro Abs 03/31/2023 9,302 (H)    Absolute Lymphocytes 03/31/2023 3,192    Absolute Monocytes 03/31/2023 877    Eosinophils Absolute 03/31/2023 247    Basophils Absolute  03/31/2023 82    Neutrophils Relative % 03/31/2023 67.9    Total Lymphocyte 03/31/2023 23.3    Monocytes Relative 03/31/2023 6.4    Eosinophils Relative 03/31/2023 1.8    Basophils Relative 03/31/2023 0.6   Office Visit on 03/26/2023  Component Date Value   Path Review 03/31/2023 8   Clinical Support on 03/03/2023  Component Date Value   WBC 03/05/2023 12.1 (H)  RBC 03/05/2023 3.97    Hemoglobin 03/05/2023 12.2    HCT 03/05/2023 36.2    MCV 03/05/2023 91.2    MCH 03/05/2023 30.7    MCHC 03/05/2023 33.7    RDW 03/05/2023 12.8    Platelets 03/05/2023 399    MPV 03/05/2023 11.2    Neutro Abs 03/05/2023 8,797 (H)    Lymphs Abs 03/05/2023 2,190    Absolute Monocytes 03/05/2023 883    Eosinophils Absolute 03/05/2023 145    Basophils Absolute 03/05/2023 85    Neutrophils Relative % 03/05/2023 72.7    Total Lymphocyte 03/05/2023 18.1    Monocytes Relative 03/05/2023 7.3    Eosinophils Relative 03/05/2023 1.2    Basophils Relative 03/05/2023 0.7   Office Visit on 02/26/2023  Component Date Value   Color, UA 02/26/2023 YELLOW    Clarity, UA 02/26/2023 CLOUDY    Glucose, UA 02/26/2023 Negative    Bilirubin, UA 02/26/2023 NEG    Ketones, UA 02/26/2023 NEG    Spec Grav, UA 02/26/2023 1.020    Blood, UA 02/26/2023 NEG    pH, UA 02/26/2023 6.0    Protein, UA 02/26/2023 Negative    Urobilinogen, UA 02/26/2023 0.2    Nitrite, UA 02/26/2023 POS    Leukocytes, UA 02/26/2023 Negative   Office Visit on 02/22/2023  Component Date Value   D-Dimer, Quant 02/22/2023 0.29    Troponin I 02/22/2023 <3    Color, Urine 02/22/2023 YELLOW    APPearance 02/22/2023 CLEAR    Specific Gravity, Urine 02/22/2023 1.015    pH 02/22/2023 6.0    Total Protein, Urine 02/22/2023 NEGATIVE    Urine Glucose 02/22/2023 NEGATIVE    Ketones, ur 02/22/2023 NEGATIVE    Bilirubin Urine 02/22/2023 NEGATIVE    Hgb urine dipstick 02/22/2023 NEGATIVE    Urobilinogen, UA 02/22/2023 0.2    Leukocytes,Ua 02/22/2023  NEGATIVE    Nitrite 02/22/2023 NEGATIVE    WBC, UA 02/22/2023 0-2/hpf    RBC / HPF 02/22/2023 0-2/hpf    Squamous Epithelial / HPF 02/22/2023 Few(5-10/hpf) (A)    Bacteria, UA 02/22/2023 Rare(<10/hpf) (A)    WBC 02/22/2023 13.3 (H)    RBC 02/22/2023 4.18    Hemoglobin 02/22/2023 12.5    HCT 02/22/2023 38.9    MCV 02/22/2023 93.1    MCHC 02/22/2023 32.1    RDW 02/22/2023 13.5    Platelets 02/22/2023 374.0    Neutrophils Relative % 02/22/2023 68.3    Lymphocytes Relative 02/22/2023 22.4    Monocytes Relative 02/22/2023 8.3    Eosinophils Relative 02/22/2023 0.5    Basophils Relative 02/22/2023 0.5    Neutro Abs 02/22/2023 9.1 (H)    Lymphs Abs 02/22/2023 3.0    Monocytes Absolute 02/22/2023 1.1 (H)    Eosinophils Absolute 02/22/2023 0.1    Basophils Absolute 02/22/2023 0.1    Sodium 02/22/2023 139    Potassium 02/22/2023 3.9    Chloride 02/22/2023 101    CO2 02/22/2023 28    Glucose, Bld 02/22/2023 84    BUN 02/22/2023 11    Creatinine, Ser 02/22/2023 0.55    Total Bilirubin 02/22/2023 0.2    Alkaline Phosphatase 02/22/2023 72    AST 02/22/2023 19    ALT 02/22/2023 16    Total Protein 02/22/2023 7.9    Albumin 02/22/2023 4.8    GFR 02/22/2023 109.19    Calcium 02/22/2023 9.9    Color, UA 02/22/2023 yellow    Clarity, UA 02/22/2023 cloudy    Glucose, UA 02/22/2023 Negative    Bilirubin, UA  02/22/2023 Negative    Ketones, UA 02/22/2023 Negative    Spec Grav, UA 02/22/2023 1.020    Blood, UA 02/22/2023 Negative    pH, UA 02/22/2023 6.0    Protein, UA 02/22/2023 Negative    Urobilinogen, UA 02/22/2023 0.2    Nitrite, UA 02/22/2023 Negative    Leukocytes, UA 02/22/2023 Negative    High Sens Troponin I 02/22/2023 4    POC Glucose 02/22/2023 107 (A)   Admission on 02/16/2023, Discharged on 02/16/2023  Component Date Value   WBC 02/16/2023 13.6 (H)    RBC 02/16/2023 3.55 (L)    Hemoglobin 02/16/2023 11.0 (L)    HCT 02/16/2023 33.0 (L)    MCV 02/16/2023 93.0    MCH  02/16/2023 31.0    MCHC 02/16/2023 33.3    RDW 02/16/2023 12.5    Platelets 02/16/2023 250    nRBC 02/16/2023 0.0    Color, Urine 02/16/2023 YELLOW (A)    APPearance 02/16/2023 CLOUDY (A)    Specific Gravity, Urine 02/16/2023 1.016    pH 02/16/2023 5.0    Glucose, UA 02/16/2023 NEGATIVE    Hgb urine dipstick 02/16/2023 NEGATIVE    Bilirubin Urine 02/16/2023 NEGATIVE    Ketones, ur 02/16/2023 NEGATIVE    Protein, ur 02/16/2023 NEGATIVE    Nitrite 02/16/2023 NEGATIVE    Leukocytes,Ua 02/16/2023 TRACE (A)    RBC / HPF 02/16/2023 0-5    WBC, UA 02/16/2023 6-10    Bacteria, UA 02/16/2023 RARE (A)    Squamous Epithelial / HPF 02/16/2023 21-50    Mucus 02/16/2023 PRESENT    Troponin I (High Sensiti* 02/16/2023 <2    Sodium 02/16/2023 137    Potassium 02/16/2023 3.4 (L)    Chloride 02/16/2023 102    CO2 02/16/2023 24    Glucose, Bld 02/16/2023 132 (H)    BUN 02/16/2023 15    Creatinine, Ser 02/16/2023 0.54    Calcium 02/16/2023 8.8 (L)    Total Protein 02/16/2023 6.9    Albumin 02/16/2023 4.1    AST 02/16/2023 23    ALT 02/16/2023 15    Alkaline Phosphatase 02/16/2023 54    Total Bilirubin 02/16/2023 0.6    GFR, Estimated 02/16/2023 >60    Anion gap 02/16/2023 11    Troponin I (High Sensiti* 02/16/2023 3   Office Visit on 02/11/2023  Component Date Value   Color, UA 02/11/2023 Yellow    Clarity, UA 02/11/2023 clear    Glucose, UA 02/11/2023 Negative    Bilirubin, UA 02/11/2023 negative    Ketones, UA 02/11/2023 15    Spec Grav, UA 02/11/2023 1.025    Blood, UA 02/11/2023 negative    pH, UA 02/11/2023 5.0    Protein, UA 02/11/2023 Negative    Urobilinogen, UA 02/11/2023 0.2    Nitrite, UA 02/11/2023 negative    Leukocytes, UA 02/11/2023 Trace (A)    MICRO NUMBER: 02/11/2023 [id number]    SPECIMEN QUALITY: 02/11/2023 Adequate    Sample Source 02/11/2023 URINE    STATUS: 02/11/2023 FINAL    Result: 02/11/2023                     Value:Mixed genital flora isolated. These  superficial bacteria are not indicative of a urinary tract infection. No further organism identification is warranted on this specimen. If clinically indicated, recollect clean-catch, mid-stream urine and transfer  immediately to Urine Culture Transport Tube.    WBC, UA 02/11/2023 0-2/hpf    RBC / HPF 02/11/2023 0-2/hpf    Mucus, UA  02/11/2023 Presence of (A)    Squamous Epithelial / HPF 02/11/2023 Few(5-10/hpf) (A)    Hyaline Casts, UA 02/11/2023 Presence of (A)   There may be more visits with results that are not included.  No image results found. DG Lumbar Spine Complete Result Date: 08/03/2023 CLINICAL DATA:  Lumbar back pain from a fall EXAM: LUMBAR SPINE - COMPLETE 4+ VIEW COMPARISON:  09/06/2013 FINDINGS: There is no evidence of lumbar spine fracture. Alignment is normal. Intervertebral disc spaces are maintained. IMPRESSION: Negative. Electronically Signed   By: Corlis Leak M.D.   On: 08/03/2023 12:38       Assessment & Plan Acute cystitis without hematuria Recurrent Urinary Tract Infections (UTIs)   Symptoms of a UTI, including frequent urination and malodorous urine, began yesterday. She experiences recurrent UTIs, with multiple episodes in the past year, indicating a pattern of recurrence. There is tenderness in the kidney area but no fever. Cephalexin has been effective previously. Discussed the option of chronic suppressive antibiotics to prevent recurrence, noting it is a significant decision and not suitable for everyone, for her to discuss with urologist. Prescribe Keflex (cephalexin) for 10 days. Consult with a urologist at [facility] regarding chronic suppressive antibiotics. Prescribe Pyridium for symptomatic relief.  Bladder Sling Consideration  She underwent a bladder sling procedure approximately 20 years ago. Discussed the potential for an upgrade but advised against it due to complications and legal issues associated with bladder slings. No immediate plan for bladder  sling revision.  Chronic Pain Management   Chronic pain management needs to be discussed with the worker's compensation doctor. Hypersensitivity to procedures may be influenced by chronic opioid use. Discuss pain management options with the worker's compensation doctor.      Orders Placed During this Encounter:   Orders Placed This Encounter  Procedures   POCT urinalysis dipstick   Meds ordered this encounter  Medications   cephALEXin (KEFLEX) 500 MG capsule    Sig: Take 1 capsule (500 mg total) by mouth 3 (three) times daily.    Dispense:  30 capsule    Refill:  0   phenazopyridine (PYRIDIUM) 200 MG tablet    Sig: Take 1 tablet (200 mg total) by mouth 3 (three) times daily.    Dispense:  60 tablet    Refill:  0       **This document was synthesized by artificial intelligence ([employer]) using HIPAA-compliant recording of the clinical interaction;   We discussed the use of AI scribe software for clinical note transcription with the patient, who gave verbal consent to proceed.    Additional Info: This encounter employed state-of-the-art, real-time, collaborative documentation. The patient actively reviewed and assisted in updating their electronic medical record on a shared screen, ensuring transparency and facilitating joint problem-solving for the problem list, overview, and plan. This approach promotes accurate, informed care. The treatment plan was discussed and reviewed in detail, including medication safety, potential side effects, and all patient questions. We confirmed understanding and comfort with the plan. Follow-up instructions were established, including contacting the office for any concerns, returning if symptoms worsen, persist, or new symptoms develop, and precautions for potential emergency department visits.

## 2023-08-17 DIAGNOSIS — N3000 Acute cystitis without hematuria: Secondary | ICD-10-CM | POA: Diagnosis not present

## 2023-08-18 ENCOUNTER — Telehealth: Payer: Self-pay | Admitting: Family Medicine

## 2023-08-18 NOTE — Telephone Encounter (Unsigned)
 Copied from CRM [id number]. 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

## 2023-08-19 ENCOUNTER — Encounter (HOSPITAL_BASED_OUTPATIENT_CLINIC_OR_DEPARTMENT_OTHER): Payer: Self-pay

## 2023-08-19 ENCOUNTER — Encounter: Payer: Self-pay | Admitting: Internal Medicine

## 2023-08-19 LAB — URINE CULTURE
MICRO NUMBER:: 16186479
SPECIMEN QUALITY:: ADEQUATE

## 2023-08-23 ENCOUNTER — Encounter: Payer: Self-pay | Admitting: Family Medicine

## 2023-08-23 ENCOUNTER — Ambulatory Visit (INDEPENDENT_AMBULATORY_CARE_PROVIDER_SITE_OTHER): Admitting: Family Medicine

## 2023-08-23 ENCOUNTER — Other Ambulatory Visit (HOSPITAL_BASED_OUTPATIENT_CLINIC_OR_DEPARTMENT_OTHER): Payer: Self-pay

## 2023-08-23 VITALS — BP 145/90 | HR 107 | Temp 98.0°F | Resp 18 | Ht 59.0 in | Wt 140.1 lb

## 2023-08-23 DIAGNOSIS — N3000 Acute cystitis without hematuria: Secondary | ICD-10-CM | POA: Diagnosis not present

## 2023-08-23 DIAGNOSIS — F419 Anxiety disorder, unspecified: Secondary | ICD-10-CM | POA: Diagnosis not present

## 2023-08-23 DIAGNOSIS — G8929 Other chronic pain: Secondary | ICD-10-CM | POA: Diagnosis not present

## 2023-08-23 DIAGNOSIS — F902 Attention-deficit hyperactivity disorder, combined type: Secondary | ICD-10-CM | POA: Diagnosis not present

## 2023-08-23 DIAGNOSIS — M5441 Lumbago with sciatica, right side: Secondary | ICD-10-CM | POA: Diagnosis not present

## 2023-08-23 DIAGNOSIS — I10 Essential (primary) hypertension: Secondary | ICD-10-CM | POA: Diagnosis not present

## 2023-08-23 MED ORDER — METHOCARBAMOL 500 MG PO TABS: 500.0000 mg | ORAL_TABLET | Freq: Four times a day (QID) | ORAL | 2 refills | Status: DC | PRN

## 2023-08-23 MED ORDER — LOSARTAN POTASSIUM 100 MG PO TABS: 100.0000 mg | ORAL_TABLET | Freq: Every day | ORAL | 1 refills | Status: DC

## 2023-08-23 MED ORDER — CIPROFLOXACIN HCL 500 MG PO TABS: 500.0000 mg | ORAL_TABLET | Freq: Two times a day (BID) | ORAL | 0 refills | Status: AC

## 2023-08-23 MED ORDER — HYDROCODONE-ACETAMINOPHEN 10-325 MG PO TABS: 1.0000 | ORAL_TABLET | Freq: Three times a day (TID) | ORAL | 0 refills | Status: DC | PRN

## 2023-08-23 MED ORDER — TRIMETHOPRIM 100 MG PO TABS: 100.0000 mg | ORAL_TABLET | Freq: Every day | ORAL | 0 refills | Status: DC

## 2023-08-23 NOTE — Patient Instructions (Signed)
 It was very nice to see you today!  Change keflex to cipro   PLEASE NOTE:  If you had any lab tests please let us know if you have not heard back within a few days. You may see your results on MyChart before we have a chance to review them but we will give you a call once they are reviewed by [place]. If we ordered any referrals today, please let us know if you have not heard from their office within the next week.   Please try these tips to maintain a healthy lifestyle:  Eat most of your calories during the day when you are active. Eliminate processed foods including packaged sweets (pies, cakes, cookies), reduce intake of potatoes, white bread, white pasta, and white rice. Look for whole grain options, oat flour or almond flour.  Each meal should contain half fruits/vegetables, one quarter protein, and one quarter carbs (no bigger than a computer mouse).  Cut down on sweet beverages. This includes juice, soda, and sweet tea. Also watch fruit intake, though this is a healthier sweet option, it still contains natural sugar! Limit to 3 servings daily.  Drink at least 1 glass of water with each meal and aim for at least 8 glasses per day  Exercise at least 150 minutes every week.

## 2023-08-23 NOTE — Progress Notes (Signed)
 Subjective:     Patient ID: Laurie Roman, female    DOB: Apr 19, 1976, 48 y.o.   MRN: [id number]  Chief Complaint  Patient presents with   Medical Management of Chronic Issues    Follow-up on medications and UTI, urine has ammonia smell and lower back pain    HPI Discussed the use of AI scribe software for clinical note transcription with the patient, who gave verbal consent to proceed.  History of Present Illness   Laurie Roman is a 48 year old female with chronic pain and hypertension who presents for follow-up on her medication management.  She experiences ongoing issues with chronic pain management and is currently prescribed Suboxone, which limits her ability to receive other pain medications. Her pain is not adequately controlled, and she is frustrated with the current management plan. She is hesitant about injections for her joint pain due to concerns about the procedure and its effectiveness, and she worries about the impact on her work schedule, as she has limited vacation time.  A recent slip and fall incident has contributed to her back pain, which she describes as different from her previous sciatic nerve pain, lacking the burning sensation. She is concerned about the impact of her pain on her ability to work and the lack of support from her employer regarding medical appointments.  She has a history of headaches and was recently evaluated with a CT scan, which was normal. However, she continues to experience headaches that are different from her usual migraines, describing them as a new type of pain-thought to be related to injury.  has referral to neuro, but doesn't know when appt will be. Her blood pressure has been elevated, with readings as high as 156 mmHg systolic.  She is currently taking amlodipine 10mg  and losartan 100mg  for hypertension and reports that it generally works well, but she experiences symptoms of high blood pressure, such as flushed cheeks, when her  medication timing is off and if in pain(which she is right now). She is not taking metoprolol as it caused her blood pressure to drop too low.  She has a history of urinary tract infections and is currently experiencing symptoms suggestive of a UTI, including kidney pain and foul-smelling urine. Previous antibiotic treatments were ineffective, and she has a sulfa allergy. culture shows the keflex should work, but Laurie Roman feels it isn't.  hasn't gotten the trimetoprim yet for uti prevention.  has seen urol   She discusses her anxiety and ADHD, noting that her anxiety is exacerbated by her current pain and work stress. She is taking Adderall for ADHD and reports withdrawal symptoms when not taken regularly. She is also prescribed Abilify for anxiety but does not feel an increase in dosage is necessary at this time.       Health Maintenance Due  Topic Date Due   Colonoscopy  Never done    Past Medical History:  Diagnosis Date   ADD (attention deficit disorder)    Anxiety    Asthma    Blood transfusion without reported diagnosis    Depression    GERD (gastroesophageal reflux disease)    IBS (irritable bowel syndrome)    Insomnia    Lumbago    Migraine    Opioid dependence (HCC) 08/21/2022   Explained 08/21/22 that 4 months hydrocodone for pain management for delays in dental care has resulted in hyperalgesia and dependence   Other chest pain 08/17/2022   Normal EKG and D-dimer  Associated with  lifting on father Doesn't radiate  Nonexertional  Not substernal chest pain more to the side, feels related to sleeping on it wrong (reproducible)   Sciatica    Sleep apnea    Tietze's disease    Urinary incontinence 10/26/2022    Past Surgical History:  Procedure Laterality Date   APPENDECTOMY     CESAREAN SECTION     TONSILLECTOMY     TOTAL ABDOMINAL HYSTERECTOMY W/ BILATERAL SALPINGOOPHORECTOMY     TUBAL LIGATION       Current Outpatient Medications:    albuterol (VENTOLIN HFA) 108 (90  Base) MCG/ACT inhaler, Inhale 2 puffs into the lungs every 6 (six) hours as needed for wheezing or shortness of breath., Disp: 18 g, Rfl: 3   amLODipine (NORVASC) 10 MG tablet, Take 1 tablet (10 mg total) by mouth daily. Replaces amlodipine 5, for uncontrolled blood pressure., Disp: 90 tablet, Rfl: 3   amphetamine-dextroamphetamine (ADDERALL) 20 MG tablet, Take 1 tablet (20 mg total) by mouth 2 (two) times daily., Disp: 60 tablet, Rfl: 0   ARIPiprazole (ABILIFY) 2 MG tablet, Take 1 tablet (2 mg total) by mouth daily as needed., Disp: 90 tablet, Rfl: 3   Buprenorphine HCl-Naloxone HCl (SUBOXONE) 8-2 MG FILM, Place 2 Film under the tongue daily at 6 (six) AM. At first, take only 0.25  films (2 mg opioid) each hour until minimum effective dose reached to control cravings, up to 2 films total in day max., Disp: 60 each, Rfl: 0   chlorhexidine (PERIDEX) 0.12 % solution, Use as directed 15 mLs in the mouth or throat 2 (two) times daily., Disp: 1893 mL, Rfl: 0   ciclopirox (LOPROX) 0.77 % cream, Apply topically 2 (two) times daily., Disp: 90 g, Rfl: 3   ciprofloxacin (CIPRO) 500 MG tablet, Take 1 tablet (500 mg total) by mouth 2 (two) times daily for 10 days., Disp: 20 tablet, Rfl: 0   Cyanocobalamin (B-12) 1000 MCG SUBL, Place 1 tablet under the tongue daily at 6 (six) AM., Disp: 100 tablet, Rfl: 3   Erenumab-aooe (AIMOVIG) 140 MG/ML SOAJ, Inject 140 mg into the skin every 30 (thirty) days., Disp: 1 mL, Rfl: 3   fluticasone (FLONASE) 50 MCG/ACT nasal spray, Place 1 spray into both nostrils daily., Disp: 48 g, Rfl: 3   gabapentin (NEURONTIN) 600 MG tablet, Take 1 tablet (600 mg total) by mouth 3 (three) times daily., Disp: 90 tablet, Rfl: 0   gabapentin (NEURONTIN) 800 MG tablet, Take 1 tablet (800 mg total) by mouth 4 (four) times daily as needed. Replaces prior dosing schedule., Disp: 270 tablet, Rfl: 1   HYDROcodone-acetaminophen (NORCO) 10-325 MG tablet, Take 1 tablet by mouth every 8 (eight) hours as  needed., Disp: 45 tablet, Rfl: 0   ketorolac (TORADOL) 10 MG tablet, Take 1 tablet (10 mg total) by mouth every 6 (six) hours as needed., Disp: 20 tablet, Rfl: 0   lidocaine (HM LIDOCAINE PATCH) 4 %, Place 1 patch onto the skin daily., Disp: 30 patch, Rfl: 1   lidocaine (XYLOCAINE) 5 % ointment, Apply 1 Application topically as needed., Disp: 35.44 g, Rfl: 0   Lidocaine 10 % CREA, Apply 1 Application topically daily at 6 (six) AM., Disp: 30 g, Rfl: 11   linaclotide (LINZESS) 145 MCG CAPS capsule, Take 1 capsule (145 mcg total) by mouth daily before breakfast., Disp: 90 capsule, Rfl: 3   methocarbamol (ROBAXIN) 500 MG tablet, Take 1 tablet (500 mg total) by mouth every 6 (six) hours as needed for muscle  spasms., Disp: 60 tablet, Rfl: 2   methylPREDNISolone (MEDROL DOSEPAK) 4 MG TBPK tablet, See admin instructions., Disp: , Rfl:    metoprolol tartrate (LOPRESSOR) 25 MG tablet, Take 1 tablet (25 mg total) by mouth 2 (two) times daily., Disp: 180 tablet, Rfl: 0   mometasone-formoterol (DULERA) 100-5 MCG/ACT AERO, Inhale 2 puffs into the lungs 2 (two) times daily., Disp: 13 g, Rfl: 2   NONFORMULARY OR COMPOUNDED ITEM, Recommended starting JOURNAVX oral dose is 100 mg. Take the starting  dose on an empty stomach at least 1 hour before or 2 hours after food.  Clear liquids may be consumed during this time (e.g., water, apple juice,  vegetable broth, tea, black coffee). (2.1)   Starting 12 hours after the starting dose, take 50 mg of JOURNAVX orally  every 12 hours. Take these doses with or without food. (2.1)  Use JOURNAVX for the shortest duration, consistent with individual  patient treatment goals. Use of JOURNAVX for the treatment of acute pain  has not been studied beyond 14 days.  28 DAY SUPPLY, Disp: 3 each, Rfl: 3   nystatin (MYCOSTATIN) 100000 UNIT/ML suspension, Take 5 mLs (500,000 Units total) by mouth 4 (four) times daily. Swish and hold in mouth few seconds and swallow, Disp: 60 mL, Rfl: 0    nystatin (MYCOSTATIN/NYSTOP) powder, Apply 1 Application topically 3 (three) times daily., Disp: 60 g, Rfl: 11   nystatin cream (MYCOSTATIN), Apply 1 Application topically 2 (two) times daily., Disp: 30 g, Rfl: 5   ondansetron (ZOFRAN) 4 MG tablet, Take 1 tablet (4 mg total) by mouth every 8 (eight) hours as needed for nausea or vomiting., Disp: 20 tablet, Rfl: 1   pantoprazole (PROTONIX) 40 MG tablet, Take 1 tablet (40 mg total) by mouth 2 (two) times daily before a meal., Disp: 180 tablet, Rfl: 1   phenazopyridine (PYRIDIUM) 200 MG tablet, Take 1 tablet (200 mg total) by mouth 3 (three) times daily., Disp: 60 tablet, Rfl: 0   potassium chloride (KLOR-CON M) 20 MEQ tablet, Take 1 tablet (20 mEq total) by mouth daily., Disp: 90 tablet, Rfl: 0   Probiotic Product (PROBIOTIC DAILY) CAPS, Take 1 capsule by mouth daily., Disp: 30 capsule, Rfl: 2   promethazine (PHENERGAN) 25 MG tablet, Take 25 mg by mouth every 6 (six) hours as needed., Disp: , Rfl:    Rimegepant Sulfate (NURTEC) 75 MG TBDP, Take 1 tablet (75 mg total) by mouth once a week., Disp: 30 tablet, Rfl: 2   rizatriptan (MAXALT) 10 MG tablet, Take 1 tablet (10 mg total) by mouth as needed for migraine. May repeat in 2 hours if needed, Disp: 10 tablet, Rfl: 3   tiZANidine (ZANAFLEX) 2 MG tablet, Take 2 tablets (4 mg total) by mouth every 6 (six) hours as needed for muscle spasms., Disp: 30 tablet, Rfl: 0   fluconazole (DIFLUCAN) 150 MG tablet, Take 1 tablet (150 mg total) by mouth every three (3) days as needed. (Patient not taking: Reported on 08/23/2023), Disp: 2 tablet, Rfl: 0   losartan (COZAAR) 100 MG tablet, Take 1 tablet (100 mg total) by mouth daily., Disp: 90 tablet, Rfl: 1   trimethoprim (TRIMPEX) 100 MG tablet, Take 1 tablet (100 mg total) by mouth daily., Disp: 90 tablet, Rfl: 0  Current Facility-Administered Medications:    ondansetron (ZOFRAN-ODT) disintegrating tablet 4 mg, 4 mg, Oral, Once, [name], MD  Allergies   Allergen Reactions   Cefazolin Hives    Only IV formulation can safely  take by mouth keflex   Ondansetron Hives   Other Hives    Odansetron injection.   Sulfa Antibiotics Rash   Tramadol Hcl     Other reaction(s): chest pain   Amitriptyline     hallucination   Galcanezumab-Gnlm Other (See Comments)    galcanezumab-gnlm   Ultram [Tramadol] Other (See Comments)    Tachycardia   Nitrofurantoin Itching    Red welts.  nitrofurantoin   Oxycodone Rash   Sulfasalazine Rash   ROS neg/noncontributory except as noted HPI/below      Objective:     BP (!) 145/90 (BP Location: Left Arm, Patient Position: Sitting, Cuff Size: Normal)   Pulse (!) 107   Temp 98 F (36.7 C) (Temporal)   Resp 18   Ht 4\' 11"  (1.499 m)   Wt 140 lb 2 oz (63.6 kg)   SpO2 99%   BMI 28.30 kg/m  Wt Readings from Last 3 Encounters:  08/23/23 140 lb 2 oz (63.6 kg)  08/11/23 135 lb (61.2 kg)  07/07/23 149 lb 6 oz (67.8 kg)    Physical Exam   Gen: WDWN NAD HEENT: NCAT, conjunctiva not injected, sclera nonicteric NECK:  supple, no thyromegaly, no nodes, no carotid bruits CARDIAC: RRR, S1S2+, no murmur. DP 2+B LUNGS: CTAB. No wheezes ABDOMEN:  BS+, soft, NTND, No HSM, no masses.  +R cvat but also TTP EXT:  no edema MSK: no gross abnormalities.  NEURO: A&O x3.  CN II-XII intact.  PSYCH: normal mood. Good eye contact Reviewed labs    Assessment & Plan:  Anxiety  Essential hypertension -     Losartan Potassium; Take 1 tablet (100 mg total) by mouth daily.  Dispense: 90 tablet; Refill: 1  Chronic bilateral low back pain with right-sided sciatica -     Methocarbamol; Take 1 tablet (500 mg total) by mouth every 6 (six) hours as needed for muscle spasms.  Dispense: 60 tablet; Refill: 2 -     HYDROcodone-Acetaminophen; Take 1 tablet by mouth every 8 (eight) hours as needed.  Dispense: 45 tablet; Refill: 0  Attention deficit hyperactivity disorder (ADHD), combined type  Acute cystitis without  hematuria  Other orders -     Ciprofloxacin HCl; Take 1 tablet (500 mg total) by mouth 2 (two) times daily for 10 days.  Dispense: 20 tablet; Refill: 0 -     Trimethoprim; Take 1 tablet (100 mg total) by mouth daily.  Dispense: 90 tablet; Refill: 0  Assessment and Plan    Chronic Pain   Chronic pain management is complicated by Suboxone use, limiting additional pain medication prescriptions. She experiences significant joint pain and is considering injections, despite concerns about timing with MRI and previous experiences affecting work and vacation. [facility] recommended injections without an updated MRI, which she finds concerning. Injection effectiveness ranges from 10% to 90%. She is not taking Suboxone regularly due to fatigue. Proceed with joint injections as planned. Coordinate with [facility] for injection scheduling. Discuss pain management options with the pain management specialist. Ensure all relevant medical records are sent to the appropriate specialists. Laurie Roman requesting vicodin for pain until seen at pain mgmt.  very aware of risks, etc.   Hypertension   Hypertension is managed with amlodipine 10mg  and losartan 100mg . She reports elevated blood pressure readings and symptoms such as flushed cheeks. She is considering adjusting amlodipine timing for better symptom control. Metoprolol is discontinued due to hypotension. Adjust amlodipine timing to earlier in the evening for better symptom control. Discontinue  metoprolol due to adverse effects.  Urinary Tract Infection (UTI)   Symptoms consistent with a UTI include kidney pain and foul-smelling urine. Previous antibiotics were ineffective. She is considering ciprofloxacin due to a sulfa allergy, which limits options. Recurrent UTIs may be related to bladder irritation and dehydration. Switch antibiotic treatment to ciprofloxacin. Send prescription for trimethoprim to the pharmacy.  Anxiety   Anxiety is exacerbated by stressors,  including pain and work-related issues. She is not interested in increasing Abilify dosage.  ADHD   ADHD is managed with Adderall. She experiences withdrawal symptoms on weekends, indicating a need for consistent dosing. Consider taking a half dose of Adderall on non-working days to prevent withdrawal symptoms.  General Health Maintenance   She has not had a mammogram in years and is considering scheduling one. Instruct to schedule a mammogram at a convenient location.        Return in about 3 months (around 11/23/2023) for chronic follow-up.  Angelena Sole, MD

## 2023-08-25 ENCOUNTER — Other Ambulatory Visit: Payer: Self-pay | Admitting: Family Medicine

## 2023-08-25 ENCOUNTER — Other Ambulatory Visit (HOSPITAL_BASED_OUTPATIENT_CLINIC_OR_DEPARTMENT_OTHER): Payer: Self-pay

## 2023-08-25 DIAGNOSIS — F902 Attention-deficit hyperactivity disorder, combined type: Secondary | ICD-10-CM

## 2023-08-25 MED ORDER — LORAZEPAM 1 MG PO TABS
1.0000 mg | ORAL_TABLET | ORAL | 0 refills | Status: DC
  Filled 2023-08-25: qty 2, 1d supply, fill #0

## 2023-08-25 NOTE — Telephone Encounter (Signed)
 Pharmacy Patient Advocate Encounter  Received notification from [facility] MEDICAID that Prior Authorization for Nurtec 75mg  has been DENIED.  See denial reason below. No denial letter attached in CMM. Will attach denial letter to Media tab once received.   PA #/Case ID/Reference #: [id number]

## 2023-08-25 NOTE — Telephone Encounter (Signed)
 FYI

## 2023-08-26 MED ORDER — AMPHETAMINE-DEXTROAMPHETAMINE 20 MG PO TABS: 20.0000 mg | ORAL_TABLET | Freq: Two times a day (BID) | ORAL | 0 refills | Status: DC

## 2023-08-28 ENCOUNTER — Other Ambulatory Visit: Payer: Self-pay | Admitting: Family Medicine

## 2023-08-28 DIAGNOSIS — M5431 Sciatica, right side: Secondary | ICD-10-CM

## 2023-08-30 ENCOUNTER — Ambulatory Visit (INDEPENDENT_AMBULATORY_CARE_PROVIDER_SITE_OTHER): Admitting: Internal Medicine

## 2023-08-30 VITALS — Ht 59.0 in

## 2023-08-30 DIAGNOSIS — G8929 Other chronic pain: Secondary | ICD-10-CM

## 2023-08-30 DIAGNOSIS — N390 Urinary tract infection, site not specified: Secondary | ICD-10-CM | POA: Diagnosis not present

## 2023-08-30 DIAGNOSIS — N3000 Acute cystitis without hematuria: Secondary | ICD-10-CM

## 2023-08-30 DIAGNOSIS — N952 Postmenopausal atrophic vaginitis: Secondary | ICD-10-CM | POA: Diagnosis not present

## 2023-08-30 DIAGNOSIS — M5442 Lumbago with sciatica, left side: Secondary | ICD-10-CM | POA: Diagnosis not present

## 2023-08-30 DIAGNOSIS — E611 Iron deficiency: Secondary | ICD-10-CM | POA: Diagnosis not present

## 2023-08-30 DIAGNOSIS — R112 Nausea with vomiting, unspecified: Secondary | ICD-10-CM | POA: Diagnosis not present

## 2023-08-30 DIAGNOSIS — M5441 Lumbago with sciatica, right side: Secondary | ICD-10-CM

## 2023-08-30 MED ORDER — TIZANIDINE HCL 4 MG PO TABS: 4.0000 mg | ORAL_TABLET | Freq: Three times a day (TID) | ORAL | 1 refills | Status: DC | PRN

## 2023-08-30 MED ORDER — ONDANSETRON HCL 4 MG/2ML IJ SOLN
4.0000 mg | Freq: Four times a day (QID) | INTRAMUSCULAR | Status: DC | PRN
  Administered 2023-08-30: 4 mg via INTRAMUSCULAR

## 2023-08-30 MED ORDER — KETOROLAC TROMETHAMINE 60 MG/2ML IM SOLN
60.0000 mg | Freq: Once | INTRAMUSCULAR | Status: DC
Start: 2023-08-30 — End: 2023-08-30

## 2023-08-30 MED ORDER — SCOPOLAMINE 1 MG/3DAYS TD PT72: 1.0000 | MEDICATED_PATCH | TRANSDERMAL | 2 refills | Status: DC

## 2023-08-30 MED ORDER — ESTRADIOL 0.1 MG/GM VA CREA
1.0000 | TOPICAL_CREAM | VAGINAL | 0 refills | Status: AC
Start: 2023-08-30 — End: ?

## 2023-08-30 MED ORDER — KETOROLAC TROMETHAMINE 60 MG/2ML IM SOLN
60.0000 mg | Freq: Once | INTRAMUSCULAR | Status: AC
  Administered 2023-08-30: 60 mg via INTRAMUSCULAR

## 2023-08-30 MED ORDER — KETOROLAC TROMETHAMINE 10 MG PO TABS: 10.0000 mg | ORAL_TABLET | Freq: Four times a day (QID) | ORAL | 0 refills | Status: DC | PRN

## 2023-08-30 NOTE — Progress Notes (Signed)
 ==============================  [facility]: [phone]   -- Medical Office Visit --  Patient: Laurie Roman      Age: 48 y.o.       Sex:  female  Date:   08/30/2023 Today's Healthcare Provider: Bernardino KANDICE Cone, MD  ==============================   Chief Complaint: Tailbone Pain (Lower back pain.) Requesting Toradol  shot  **Discussed the use of AI scribe software for clinical note transcription with the patient, who gave verbal consent to proceed.  History of Present Illness Laurie Roman is a 48 year old female with recurrent urinary tract infections and chronic pain who presents for re-evaluation of her UTI and pain management. She was referred by Dr. Mikeal for evaluation of her urinary tract infection and pain management.  She presents with concerns of a persistent urinary tract infection (UTI). The previously prescribed antibiotic did not provide relief, and she continues to experience symptoms. Another physician prescribed a different antibiotic and advised rechecking her urine. She has a history of recurrent UTIs, even when not sexually active, and is currently on a low-dose daily antibiotic.  She is experiencing significant pain related to a work-related injury for which she is undergoing physical therapy. She describes the pain as severe, reaching a level of ten over the weekend, and notes that it is challenging to adhere to the physical therapy regimen. She is receiving worker's compensation for this injury and requests a Toradol  injection for pain relief prior to physical therapy sessions.  She reports frequent nausea and vomiting, including episodes of hematemesis, which she initially attributed to stomach acid. She experiences nausea daily and uses Phenergan  and Zofran  for management. She is also on Protonix  40 mg twice daily for gastric protection. She associates her nausea with her blood pressure and headaches.  She has a history of  iron  deficiency and has undergone iron  infusions. She is concerned about her iron  levels and mentions that her ferritin levels need to be monitored. She reports hematemesis and is aware of the need for regular CBC checks due to her symptoms.  She discusses symptoms of vaginal dryness and recurrent UTIs, which she associates with her post-hysterectomy status and lack of estrogen. She notes that she has been experiencing dryness even when initially wet, and this has been a persistent issue since her hysterectomy.   Background: This is a 48 y.o. female who has Migraine; ADHD; Essential hypertension; Mild obstructive sleep apnea; Controlled substance agreement signed; Gastroesophageal reflux disease without esophagitis; Left sided sciatica; Asthma; Maxillary sinusitis; Anxiety; Primary insomnia; Depression, major, single episode, severe (HCC); Cervicalgia; Chronic dental pain; Opioid dependence (HCC); B12 deficiency; Urinary incontinence; Flank pain; Uric acid crystalluria; Right sided sciatica; Intertrigo; Chronic bilateral low back pain with sciatica; Chronic right hip pain; Tachycardia; Drug-induced constipation; and Grief reaction with prolonged bereavement on their problem list.  Visually reviewed that patient  has a past medical history of ADD (attention deficit disorder), Anxiety, Asthma, Blood transfusion without reported diagnosis, Depression, GERD (gastroesophageal reflux disease), IBS (irritable bowel syndrome), Insomnia, Lumbago, Migraine, Opioid dependence (HCC) (08/21/2022), Other chest pain (08/17/2022), Sciatica, Sleep apnea, Tietze's disease, and Urinary incontinence (10/26/2022). Manually updated: No problems updated. Verbally reviewed (per [employer]bridge-generated extraction):  Medications: Visually reviewed/updated: Current Outpatient Medications on File Prior to Visit  Medication Sig   albuterol  (VENTOLIN  HFA) 108 (90 Base) MCG/ACT inhaler Inhale 2 puffs into the lungs every 6 (six) hours  as needed for wheezing or shortness of breath.   amLODipine  (NORVASC ) 10 MG tablet  Take 1 tablet (10 mg total) by mouth daily. Replaces amlodipine  5, for uncontrolled blood pressure.   amphetamine -dextroamphetamine  (ADDERALL) 20 MG tablet Take 1 tablet (20 mg total) by mouth 2 (two) times daily.   ARIPiprazole  (ABILIFY ) 2 MG tablet Take 1 tablet (2 mg total) by mouth daily as needed.   Buprenorphine  HCl-Naloxone  HCl (SUBOXONE ) 8-2 MG FILM Place 2 Film under the tongue daily at 6 (six) AM. At first, take only 0.25  films (2 mg opioid) each hour until minimum effective dose reached to control cravings, up to 2 films total in day max.   chlorhexidine  (PERIDEX ) 0.12 % solution Use as directed 15 mLs in the mouth or throat 2 (two) times daily.   ciclopirox  (LOPROX ) 0.77 % cream Apply topically 2 (two) times daily.   ciprofloxacin  (CIPRO ) 500 MG tablet Take 1 tablet (500 mg total) by mouth 2 (two) times daily for 10 days.   Cyanocobalamin  (B-12) 1000 MCG SUBL Place 1 tablet under the tongue daily at 6 (six) AM.   Erenumab -aooe (AIMOVIG ) 140 MG/ML SOAJ Inject 140 mg into the skin every 30 (thirty) days.   fluconazole  (DIFLUCAN ) 150 MG tablet Take 1 tablet (150 mg total) by mouth every three (3) days as needed.   fluticasone  (FLONASE ) 50 MCG/ACT nasal spray Place 1 spray into both nostrils daily.   gabapentin  (NEURONTIN ) 600 MG tablet Take 1 tablet (600 mg total) by mouth 3 (three) times daily.   gabapentin  (NEURONTIN ) 800 MG tablet Take 1 tablet (800 mg total) by mouth 4 (four) times daily as needed. Replaces prior dosing schedule.   HYDROcodone -acetaminophen  (NORCO) 10-325 MG tablet Take 1 tablet by mouth every 8 (eight) hours as needed.   ketorolac  (TORADOL ) 10 MG tablet Take 1 tablet (10 mg total) by mouth every 6 (six) hours as needed.   lidocaine  (HM LIDOCAINE  PATCH) 4 % Place 1 patch onto the skin daily.   lidocaine  (XYLOCAINE ) 5 % ointment Apply 1 Application topically as needed.   Lidocaine  10 %  CREA Apply 1 Application topically daily at 6 (six) AM.   linaclotide  (LINZESS ) 145 MCG CAPS capsule Take 1 capsule (145 mcg total) by mouth daily before breakfast.   LORazepam  (ATIVAN ) 1 MG tablet MUST HAVE DRIVER. Arrive 1 hour prior to your MRI appt. Take 1 tablet once your driver has been confirmed. If needed, take second tablet 30 minutes prior to MRI.   losartan  (COZAAR ) 100 MG tablet Take 1 tablet (100 mg total) by mouth daily.   methocarbamol  (ROBAXIN ) 500 MG tablet Take 1 tablet (500 mg total) by mouth every 6 (six) hours as needed for muscle spasms.   methylPREDNISolone  (MEDROL  DOSEPAK) 4 MG TBPK tablet See admin instructions.   metoprolol  tartrate (LOPRESSOR ) 25 MG tablet Take 1 tablet (25 mg total) by mouth 2 (two) times daily.   mometasone -formoterol  (DULERA ) 100-5 MCG/ACT AERO Inhale 2 puffs into the lungs 2 (two) times daily.   NONFORMULARY OR COMPOUNDED ITEM Recommended starting JOURNAVX oral dose is 100 mg. Take the starting  dose on an empty stomach at least 1 hour before or 2 hours after food.  Clear liquids may be consumed during this time (e.g., water, apple juice,  vegetable broth, tea, black coffee). (2.1)   Starting 12 hours after the starting dose, take 50 mg of JOURNAVX orally  every 12 hours. Take these doses with or without food. (2.1)  Use JOURNAVX for the shortest duration, consistent with individual  patient treatment goals. Use of JOURNAVX for the treatment  of acute pain  has not been studied beyond 14 days.  28 DAY SUPPLY   nystatin  (MYCOSTATIN ) 100000 UNIT/ML suspension Take 5 mLs (500,000 Units total) by mouth 4 (four) times daily. Swish and hold in mouth few seconds and swallow   nystatin  (MYCOSTATIN /NYSTOP ) powder Apply 1 Application topically 3 (three) times daily.   nystatin  cream (MYCOSTATIN ) Apply 1 Application topically 2 (two) times daily.   ondansetron  (ZOFRAN ) 4 MG tablet Take 1 tablet (4 mg total) by mouth every 8 (eight) hours as needed for nausea  or vomiting.   pantoprazole  (PROTONIX ) 40 MG tablet Take 1 tablet (40 mg total) by mouth 2 (two) times daily before a meal.   phenazopyridine  (PYRIDIUM ) 200 MG tablet Take 1 tablet (200 mg total) by mouth 3 (three) times daily.   potassium chloride  (KLOR-CON  M) 20 MEQ tablet Take 1 tablet (20 mEq total) by mouth daily.   Probiotic Product (PROBIOTIC DAILY) CAPS Take 1 capsule by mouth daily.   promethazine  (PHENERGAN ) 25 MG tablet Take 25 mg by mouth every 6 (six) hours as needed.   Rimegepant Sulfate (NURTEC) 75 MG TBDP Take 1 tablet (75 mg total) by mouth once a week.   rizatriptan  (MAXALT ) 10 MG tablet Take 1 tablet (10 mg total) by mouth as needed for migraine. May repeat in 2 hours if needed   trimethoprim  (TRIMPEX ) 100 MG tablet Take 1 tablet (100 mg total) by mouth daily.   tiZANidine  (ZANAFLEX ) 4 MG tablet Take 1 tablet (4 mg total) by mouth every 8 (eight) hours as needed for muscle spasms.   Current Facility-Administered Medications on File Prior to Visit  Medication   ondansetron  (ZOFRAN -ODT) disintegrating tablet 4 mg   Medications Discontinued During This Encounter  Medication Reason   ketorolac  (TORADOL ) injection 60 mg        Physical Exam:    08/31/2023   11:42 AM 08/30/2023    1:57 PM 08/23/2023    4:25 PM  Vitals with BMI  Height 4' 11 4' 11   Weight 140 lbs 6 oz    BMI 28.34    Systolic 140  145  Diastolic 90  90  Pulse 103     Wt Readings from Last 10 Encounters:  08/31/23 140 lb 6 oz (63.7 kg)  08/23/23 140 lb 2 oz (63.6 kg)  08/11/23 135 lb (61.2 kg)  07/07/23 149 lb 6 oz (67.8 kg)  06/16/23 138 lb 15.7 oz (63 kg)  05/18/23 142 lb 6 oz (64.6 kg)  05/10/23 141 lb (64 kg)  04/21/23 141 lb 9.6 oz (64.2 kg)  04/19/23 141 lb 9.6 oz (64.2 kg)  04/02/23 141 lb 3.2 oz (64 kg)  Vital signs reviewed.  Nursing notes reviewed. Weight trend reviewed. Physical Exam  Physical Exam General Appearance:  No acute distress appreciable.   Well-groomed,  healthy-appearing female.  Well proportioned with no abnormal fat distribution.  Good muscle tone. Pulmonary:  Normal work of breathing at rest, no respiratory distress apparent.    Musculoskeletal: All extremities are intact.  Neurological:  Awake, alert, oriented, and engaged.  No obvious focal neurological deficits or cognitive impairments.  Sensorium seems unclouded.   Speech is clear and coherent with logical content. Psychiatric:  Appropriate mood, pleasant and cooperative demeanor, thoughtful and engaged during the exam      No results found for any visits on 08/30/23. Office Visit on 08/16/2023  Component Date Value   Color, UA 08/16/2023 -    Clarity, UA 08/16/2023 -  Glucose, UA 08/16/2023 Negative    Bilirubin, UA 08/16/2023 -    Ketones, UA 08/16/2023 -    Blood, UA 08/16/2023 -    pH, UA 08/16/2023 6.0    Protein, UA 08/16/2023 Negative    Urobilinogen, UA 08/16/2023 negative (A)    Nitrite, UA 08/16/2023 +    Leukocytes, UA 08/16/2023 Small (1+) (A)    Appearance 08/16/2023 -    Odor 08/16/2023 odor    MICRO NUMBER: 08/17/2023 [id number]    SPECIMEN QUALITY: 08/17/2023 Adequate    Sample Source 08/17/2023 URINE    STATUS: 08/17/2023 FINAL    ISOLATE 1: 08/17/2023 Escherichia coli (A)   Lab on 06/25/2023  Component Date Value   WBC 06/25/2023 8.7    RBC 06/25/2023 3.61 (L)    Hemoglobin 06/25/2023 11.0 (L)    HCT 06/25/2023 32.5 (L)    MCV 06/25/2023 90.0    MCHC 06/25/2023 33.8    RDW 06/25/2023 13.6    Platelets 06/25/2023 334.0    Neutrophils Relative % 06/25/2023 51.7    Lymphocytes Relative 06/25/2023 39.4    Monocytes Relative 06/25/2023 6.4    Eosinophils Relative 06/25/2023 2.1    Basophils Relative 06/25/2023 0.4    Neutro Abs 06/25/2023 4.5    Lymphs Abs 06/25/2023 3.4    Monocytes Absolute 06/25/2023 0.6    Eosinophils Absolute 06/25/2023 0.2    Basophils Absolute 06/25/2023 0.0    Sodium 06/25/2023 139    Potassium 06/25/2023 3.9     Chloride 06/25/2023 101    CO2 06/25/2023 29    Glucose, Bld 06/25/2023 96    BUN 06/25/2023 9    Creatinine, Ser 06/25/2023 0.59    Total Bilirubin 06/25/2023 0.2    Alkaline Phosphatase 06/25/2023 74    AST 06/25/2023 19    ALT 06/25/2023 15    Total Protein 06/25/2023 7.1    Albumin 06/25/2023 4.6    GFR 06/25/2023 107.10    Calcium 06/25/2023 9.4    Folate 06/25/2023 8.1    Iron  06/25/2023 70    Transferrin 06/25/2023 269.0    Saturation Ratios 06/25/2023 18.6 (L)    Ferritin 06/25/2023 18.0    TIBC 06/25/2023 376.6   Office Visit on 05/10/2023  Component Date Value   Color, Urine 05/10/2023 YELLOW    APPearance 05/10/2023 CLOUDY (A)    Specific Gravity, Urine 05/10/2023 1.021    pH 05/10/2023 5.5    Glucose, UA 05/10/2023 NEGATIVE    Bilirubin Urine 05/10/2023 NEGATIVE    Ketones, ur 05/10/2023 NEGATIVE    Hgb urine dipstick 05/10/2023 TRACE (A)    Protein, ur 05/10/2023 NEGATIVE    Nitrites, Initial 05/10/2023 POSITIVE (A)    Leukocyte Esterase 05/10/2023 TRACE (A)    WBC, UA 05/10/2023 6-10 (A)    RBC / HPF 05/10/2023 3-10 (A)    Squamous Epithelial / HPF 05/10/2023 10-20 (A)    Bacteria, UA 05/10/2023 MANY (A)    Hyaline Cast 05/10/2023 NONE SEEN    Yeast 05/10/2023 FEW (A)    Note 05/10/2023     Color, UA 05/10/2023 yellow    Clarity, UA 05/10/2023 cloudy    Glucose, UA 05/10/2023 Negative    Bilirubin, UA 05/10/2023 Negative    Ketones, UA 05/10/2023 Negative    Spec Grav, UA 05/10/2023 >=1.030 (A)    Blood, UA 05/10/2023 Negative    pH, UA 05/10/2023 5.5    Protein, UA 05/10/2023 Negative    Urobilinogen, UA 05/10/2023 0.2    Nitrite, UA 05/10/2023 Positive  Leukocytes, UA 05/10/2023 Negative    MICRO NUMBER: 05/10/2023 [id number]    SPECIMEN QUALITY: 05/10/2023 Adequate    Sample Source 05/10/2023 URINE    STATUS: 05/10/2023 FINAL    ISOLATE 1: 05/10/2023 Escherichia coli (A)    REFLEXIVE URINE CULTURE 05/10/2023    Office Visit on 04/19/2023   Component Date Value   MICRO NUMBER: 04/19/2023 [id number]    SPECIMEN QUALITY: 04/19/2023 Adequate    Sample Source 04/19/2023 NOT GIVEN    STATUS: 04/19/2023 FINAL    Result: 04/19/2023                     Value:Mixed genital flora isolated. These superficial bacteria are not indicative of a urinary tract infection. No further organism identification is warranted on this specimen. If clinically indicated, recollect clean-catch, mid-stream urine and transfer  immediately to Urine Culture Transport Tube.    Color, UA 04/19/2023 yellow    Clarity, UA 04/19/2023 cloudy    Glucose, UA 04/19/2023 Negative    Bilirubin, UA 04/19/2023 Negative    Ketones, UA 04/19/2023 Negative    Spec Grav, UA 04/19/2023 >=1.030 (A)    Blood, UA 04/19/2023 Positive    pH, UA 04/19/2023 5.0    Protein, UA 04/19/2023 Positive (A)    Urobilinogen, UA 04/19/2023 0.2    Nitrite, UA 04/19/2023 Negative    Leukocytes, UA 04/19/2023 Moderate (2+) (A)    Iron  04/19/2023 24 (L)    TIBC 04/19/2023 326    %SAT 04/19/2023 7 (L)    Ferritin 04/19/2023 25    Glucose, Bld 04/19/2023 76    BUN 04/19/2023 20    Creat 04/19/2023 0.64    BUN/Creatinine Ratio 04/19/2023 SEE NOTE:    Sodium 04/19/2023 139    Potassium 04/19/2023 4.7    Chloride 04/19/2023 103    CO2 04/19/2023 23    Calcium 04/19/2023 9.2    Total Protein 04/19/2023 7.0    Albumin 04/19/2023 4.4    Globulin 04/19/2023 2.6    AG Ratio 04/19/2023 1.7    Total Bilirubin 04/19/2023 0.2    Alkaline phosphatase (AP* 04/19/2023 77    AST 04/19/2023 16    ALT 04/19/2023 12    WBC 04/19/2023 12.9 (H)    RBC 04/19/2023 3.51 (L)    Hemoglobin 04/19/2023 10.7 (L)    HCT 04/19/2023 32.4 (L)    MCV 04/19/2023 92.3    MCH 04/19/2023 30.5    MCHC 04/19/2023 33.0    RDW 04/19/2023 12.5    Platelets 04/19/2023 290    MPV 04/19/2023 12.6 (H)    Neutro Abs 04/19/2023 8,940 (H)    Absolute Lymphocytes 04/19/2023 2,619    Absolute Monocytes 04/19/2023 1,109 (H)     Eosinophils Absolute 04/19/2023 168    Basophils Absolute 04/19/2023 65    Neutrophils Relative % 04/19/2023 69.3    Total Lymphocyte 04/19/2023 20.3    Monocytes Relative 04/19/2023 8.6    Eosinophils Relative 04/19/2023 1.3    Basophils Relative 04/19/2023 0.5    Smear Review 04/19/2023     Color, Urine 04/19/2023 YELLOW    APPearance 04/19/2023 CLOUDY (A)    Specific Gravity, Urine 04/19/2023 1.025    pH 04/19/2023 < OR = 5.0    Glucose, UA 04/19/2023 NEGATIVE    Bilirubin Urine 04/19/2023 NEGATIVE    Ketones, ur 04/19/2023 NEGATIVE    Hgb urine dipstick 04/19/2023 NEGATIVE    Protein, ur 04/19/2023 TRACE (A)    Nitrite 04/19/2023 POSITIVE (A)  Leukocytes,Ua 04/19/2023 2+ (A)    WBC, UA 04/19/2023 40-60 (A)    RBC / HPF 04/19/2023 0-2    Squamous Epithelial / HPF 04/19/2023 10-20 (A)    Bacteria, UA 04/19/2023 MANY (A)    Calcium Oxalate Crystal 04/19/2023 MANY (A)    Hyaline Cast 04/19/2023 0-5 (A)    Vitamin B-12 04/19/2023 644    Folate 04/19/2023 5.0 (L)    Note 04/19/2023    Orders Only on 03/31/2023  Component Date Value   Glucose, Bld 03/31/2023 97    BUN 03/31/2023 14    Creat 03/31/2023 0.64    BUN/Creatinine Ratio 03/31/2023 SEE NOTE:    Sodium 03/31/2023 140    Potassium 03/31/2023 3.7    Chloride 03/31/2023 99    CO2 03/31/2023 26    Calcium 03/31/2023 10.2    Total Protein 03/31/2023 8.3 (H)    Albumin 03/31/2023 4.8    Globulin 03/31/2023 3.5    AG Ratio 03/31/2023 1.4    Total Bilirubin 03/31/2023 0.3    Alkaline phosphatase (AP* 03/31/2023 74    AST 03/31/2023 17    ALT 03/31/2023 9    Vitamin B-12 03/31/2023 865    WBC 03/31/2023 13.7 (H)    RBC 03/31/2023 3.94    Hemoglobin 03/31/2023 12.1    HCT 03/31/2023 36.9    MCV 03/31/2023 93.7    MCH 03/31/2023 30.7    MCHC 03/31/2023 32.8    RDW 03/31/2023 12.3    Platelets 03/31/2023 381    MPV 03/31/2023 11.2    Neutro Abs 03/31/2023 9,302 (H)    Absolute Lymphocytes 03/31/2023 3,192     Absolute Monocytes 03/31/2023 877    Eosinophils Absolute 03/31/2023 247    Basophils Absolute 03/31/2023 82    Neutrophils Relative % 03/31/2023 67.9    Total Lymphocyte 03/31/2023 23.3    Monocytes Relative 03/31/2023 6.4    Eosinophils Relative 03/31/2023 1.8    Basophils Relative 03/31/2023 0.6   Office Visit on 03/26/2023  Component Date Value   Path Review 03/31/2023 8   Clinical Support on 03/03/2023  Component Date Value   WBC 03/05/2023 12.1 (H)    RBC 03/05/2023 3.97    Hemoglobin 03/05/2023 12.2    HCT 03/05/2023 36.2    MCV 03/05/2023 91.2    MCH 03/05/2023 30.7    MCHC 03/05/2023 33.7    RDW 03/05/2023 12.8    Platelets 03/05/2023 399    MPV 03/05/2023 11.2    Neutro Abs 03/05/2023 8,797 (H)    Lymphs Abs 03/05/2023 2,190    Absolute Monocytes 03/05/2023 883    Eosinophils Absolute 03/05/2023 145    Basophils Absolute 03/05/2023 85    Neutrophils Relative % 03/05/2023 72.7    Total Lymphocyte 03/05/2023 18.1    Monocytes Relative 03/05/2023 7.3    Eosinophils Relative 03/05/2023 1.2    Basophils Relative 03/05/2023 0.7   Office Visit on 02/26/2023  Component Date Value   Color, UA 02/26/2023 YELLOW    Clarity, UA 02/26/2023 CLOUDY    Glucose, UA 02/26/2023 Negative    Bilirubin, UA 02/26/2023 NEG    Ketones, UA 02/26/2023 NEG    Spec Grav, UA 02/26/2023 1.020    Blood, UA 02/26/2023 NEG    pH, UA 02/26/2023 6.0    Protein, UA 02/26/2023 Negative    Urobilinogen, UA 02/26/2023 0.2    Nitrite, UA 02/26/2023 POS    Leukocytes, UA 02/26/2023 Negative   Office Visit on 02/22/2023  Component Date Value  D-Dimer, Quant 02/22/2023 0.29    Troponin I 02/22/2023 <3    Color, Urine 02/22/2023 YELLOW    APPearance 02/22/2023 CLEAR    Specific Gravity, Urine 02/22/2023 1.015    pH 02/22/2023 6.0    Total Protein, Urine 02/22/2023 NEGATIVE    Urine Glucose 02/22/2023 NEGATIVE    Ketones, ur 02/22/2023 NEGATIVE    Bilirubin Urine 02/22/2023 NEGATIVE    Hgb  urine dipstick 02/22/2023 NEGATIVE    Urobilinogen, UA 02/22/2023 0.2    Leukocytes,Ua 02/22/2023 NEGATIVE    Nitrite 02/22/2023 NEGATIVE    WBC, UA 02/22/2023 0-2/hpf    RBC / HPF 02/22/2023 0-2/hpf    Squamous Epithelial / HPF 02/22/2023 Few(5-10/hpf) (A)    Bacteria, UA 02/22/2023 Rare(<10/hpf) (A)    WBC 02/22/2023 13.3 (H)    RBC 02/22/2023 4.18    Hemoglobin 02/22/2023 12.5    HCT 02/22/2023 38.9    MCV 02/22/2023 93.1    MCHC 02/22/2023 32.1    RDW 02/22/2023 13.5    Platelets 02/22/2023 374.0    Neutrophils Relative % 02/22/2023 68.3    Lymphocytes Relative 02/22/2023 22.4    Monocytes Relative 02/22/2023 8.3    Eosinophils Relative 02/22/2023 0.5    Basophils Relative 02/22/2023 0.5    Neutro Abs 02/22/2023 9.1 (H)    Lymphs Abs 02/22/2023 3.0    Monocytes Absolute 02/22/2023 1.1 (H)    Eosinophils Absolute 02/22/2023 0.1    Basophils Absolute 02/22/2023 0.1    Sodium 02/22/2023 139    Potassium 02/22/2023 3.9    Chloride 02/22/2023 101    CO2 02/22/2023 28    Glucose, Bld 02/22/2023 84    BUN 02/22/2023 11    Creatinine, Ser 02/22/2023 0.55    Total Bilirubin 02/22/2023 0.2    Alkaline Phosphatase 02/22/2023 72    AST 02/22/2023 19    ALT 02/22/2023 16    Total Protein 02/22/2023 7.9    Albumin 02/22/2023 4.8    GFR 02/22/2023 109.19    Calcium 02/22/2023 9.9    Color, UA 02/22/2023 yellow    Clarity, UA 02/22/2023 cloudy    Glucose, UA 02/22/2023 Negative    Bilirubin, UA 02/22/2023 Negative    Ketones, UA 02/22/2023 Negative    Spec Grav, UA 02/22/2023 1.020    Blood, UA 02/22/2023 Negative    pH, UA 02/22/2023 6.0    Protein, UA 02/22/2023 Negative    Urobilinogen, UA 02/22/2023 0.2    Nitrite, UA 02/22/2023 Negative    Leukocytes, UA 02/22/2023 Negative    High Sens Troponin I 02/22/2023 4    POC Glucose 02/22/2023 107 (A)   Admission on 02/16/2023, Discharged on 02/16/2023  Component Date Value   WBC 02/16/2023 13.6 (H)    RBC 02/16/2023 3.55  (L)    Hemoglobin 02/16/2023 11.0 (L)    HCT 02/16/2023 33.0 (L)    MCV 02/16/2023 93.0    MCH 02/16/2023 31.0    MCHC 02/16/2023 33.3    RDW 02/16/2023 12.5    Platelets 02/16/2023 250    nRBC 02/16/2023 0.0    Color, Urine 02/16/2023 YELLOW (A)    APPearance 02/16/2023 CLOUDY (A)    Specific Gravity, Urine 02/16/2023 1.016    pH 02/16/2023 5.0    Glucose, UA 02/16/2023 NEGATIVE    Hgb urine dipstick 02/16/2023 NEGATIVE    Bilirubin Urine 02/16/2023 NEGATIVE    Ketones, ur 02/16/2023 NEGATIVE    Protein, ur 02/16/2023 NEGATIVE    Nitrite 02/16/2023 NEGATIVE    Leukocytes,Ua 02/16/2023 TRACE (  A)    RBC / HPF 02/16/2023 0-5    WBC, UA 02/16/2023 6-10    Bacteria, UA 02/16/2023 RARE (A)    Squamous Epithelial / HPF 02/16/2023 21-50    Mucus 02/16/2023 PRESENT    Troponin I (High Sensiti* 02/16/2023 <2    Sodium 02/16/2023 137    Potassium 02/16/2023 3.4 (L)    Chloride 02/16/2023 102    CO2 02/16/2023 24    Glucose, Bld 02/16/2023 132 (H)    BUN 02/16/2023 15    Creatinine, Ser 02/16/2023 0.54    Calcium 02/16/2023 8.8 (L)    Total Protein 02/16/2023 6.9    Albumin 02/16/2023 4.1    AST 02/16/2023 23    ALT 02/16/2023 15    Alkaline Phosphatase 02/16/2023 54    Total Bilirubin 02/16/2023 0.6    GFR, Estimated 02/16/2023 >60    Anion gap 02/16/2023 11    Troponin I (High Sensiti* 02/16/2023 3   There may be more visits with results that are not included.  No image results found. DG Lumbar Spine Complete Result Date: 08/03/2023 CLINICAL DATA:  Lumbar back pain from a fall EXAM: LUMBAR SPINE - COMPLETE 4+ VIEW COMPARISON:  09/06/2013 FINDINGS: There is no evidence of lumbar spine fracture. Alignment is normal. Intervertebral disc spaces are maintained. IMPRESSION: Negative. Electronically Signed   By: JONETTA Faes M.D.   On: 08/03/2023 12:38      Results LABS Urinalysis: Positive for UTI (05/2023)   Assessment & Plan Urinary tract infection without hematuria, site  unspecified Persistent UTIs continue despite antibiotic treatment. Atrophic vaginitis due to estrogen deficiency contributes to these infections by reducing blood flow and immunity in the vaginal area. Estrogen cream is expected to improve these factors and reduce UTI recurrence. Order a complete urinalysis and urine culture. Prescribe estrogen cream to be applied vaginally three times a week for one month. Discuss the risks of estrogen therapy, including stroke. Consider long-term low-dose antibiotic therapy if UTIs persist. Nausea and vomiting, unspecified vomiting type She experiences daily nausea and vomiting, potentially related to blood pressure issues and anxiety. Currently using Phenergan  and Zofran  for nausea management. A scopolamine  patch is expected to provide continuous nausea relief. Prescribe a scopolamine  patch for continuous nausea management and administer a Zofran  injection for acute nausea relief.  Vomiting blood may be related to gastritis exacerbated by Toradol  use. She is currently on Protonix  40 mg twice daily for gastric protection. Toradol  use poses a risk of worsening gastritis and gastrointestinal bleeding. Continue Protonix  40 mg twice daily and reassess if symptoms persist or worsen.  Scopolamine  prescribed to reduce nausea and vomiting. Iron  deficiency Low iron  levels with symptoms of anemia, including nausea and vomiting blood, are present. Ferritin levels need monitoring to assess iron  stores. Vomiting blood may indicate significant blood loss, necessitating regular CBC monitoring. Order ferritin and CBC tests to evaluate iron  levels and blood loss. Consider iron  supplementation based on test results. Atrophic vaginitis This condition contributes to recurrent UTIs and vaginal dryness, with estrogen deficiency post-hysterectomy as a factor. Estrogen cream is expected to improve symptoms by rebuilding healthy tissue. Prescribe estrogen cream to be applied vaginally three times a  week for one month. Discuss the risks of estrogen therapy, including stroke. Chronic bilateral low back pain with bilateral sciatica She experiences significant pain post-physical therapy for a work-related injury, rated 7-10 out of 10. A Toradol  injection is requested for pain relief before physical therapy. There are concerns about gastrointestinal side effects of Toradol ,  including vomiting blood, as it can exacerbate gastritis. Administer a Toradol  injection and prescribe Toradol  tablets for five days. Monitor for gastrointestinal side effects, including vomiting blood. Prescribe a scopolamine  patch for nausea management. Schedule a follow-up to reassess pain and gastrointestinal symptoms.       Orders Placed During this Encounter:   Orders Placed This Encounter  Procedures   Ferritin   CBC with Differential/Platelet   Comp Met (CMET)   Urinalysis, Complete    Standing Status:   Future    Number of Occurrences:   1    Expiration Date:   08/30/2024   Urinalysis, Routine w reflex microscopic    Standing Status:   Future    Number of Occurrences:   1    Expiration Date:   08/30/2024   POCT Urinalysis Dipstick   Meds ordered this encounter  Medications   ketorolac  (TORADOL ) injection 60 mg   ketorolac  (TORADOL ) 10 MG tablet    Sig: Take 1 tablet (10 mg total) by mouth every 6 (six) hours as needed.    Dispense:  20 tablet    Refill:  0   scopolamine  (TRANSDERM-SCOP) 1 MG/3DAYS    Sig: Place 1 patch (1.5 mg total) onto the skin every 3 (three) days.    Dispense:  10 patch    Refill:  2   DISCONTD: ondansetron  (ZOFRAN ) injection 4 mg   estradiol  (ESTRACE  VAGINAL) 0.1 MG/GM vaginal cream    Sig: Place 1 Applicatorful vaginally 3 (three) times a week.    Dispense:  42.5 g    Refill:  0   DISCONTD: ketorolac  (TORADOL ) injection 60 mg             **This document was synthesized by artificial intelligence (Abridge) using HIPAA-compliant recording of the clinical interaction;    We discussed the use of AI scribe software for clinical note transcription with the patient, who gave verbal consent to proceed.    Additional Info: This encounter employed state-of-the-art, real-time, collaborative documentation. The patient actively reviewed and assisted in updating their electronic medical record on a shared screen, ensuring transparency and facilitating joint problem-solving for the problem list, overview, and plan. This approach promotes accurate, informed care. The treatment plan was discussed and reviewed in detail, including medication safety, potential side effects, and all patient questions. We confirmed understanding and comfort with the plan. Follow-up instructions were established, including contacting the office for any concerns, returning if symptoms worsen, persist, or new symptoms develop, and precautions for potential emergency department visits.

## 2023-08-31 ENCOUNTER — Ambulatory Visit: Admitting: Family Medicine

## 2023-08-31 ENCOUNTER — Encounter: Payer: Self-pay | Admitting: Family Medicine

## 2023-08-31 ENCOUNTER — Encounter: Payer: Self-pay | Admitting: Internal Medicine

## 2023-08-31 ENCOUNTER — Other Ambulatory Visit: Payer: Self-pay

## 2023-08-31 VITALS — BP 140/90 | HR 103 | Temp 97.7°F | Resp 18 | Ht 59.0 in | Wt 140.4 lb

## 2023-08-31 DIAGNOSIS — R42 Dizziness and giddiness: Secondary | ICD-10-CM | POA: Diagnosis not present

## 2023-08-31 DIAGNOSIS — R300 Dysuria: Secondary | ICD-10-CM | POA: Diagnosis not present

## 2023-08-31 DIAGNOSIS — N390 Urinary tract infection, site not specified: Secondary | ICD-10-CM | POA: Diagnosis not present

## 2023-08-31 DIAGNOSIS — G43011 Migraine without aura, intractable, with status migrainosus: Secondary | ICD-10-CM | POA: Diagnosis not present

## 2023-08-31 LAB — CBC WITH DIFFERENTIAL/PLATELET
Basophils Absolute: 0.1 10*3/uL (ref 0.0–0.1)
Basophils Relative: 0.5 % (ref 0.0–3.0)
Eosinophils Absolute: 0.3 10*3/uL (ref 0.0–0.7)
Eosinophils Relative: 3 % (ref 0.0–5.0)
HCT: 31.1 % — ABNORMAL LOW (ref 36.0–46.0)
Hemoglobin: 10.5 g/dL — ABNORMAL LOW (ref 12.0–15.0)
Lymphocytes Relative: 31 % (ref 12.0–46.0)
Lymphs Abs: 3.5 10*3/uL (ref 0.7–4.0)
MCHC: 33.8 g/dL (ref 30.0–36.0)
MCV: 89 fl (ref 78.0–100.0)
Monocytes Absolute: 0.9 10*3/uL (ref 0.1–1.0)
Monocytes Relative: 7.7 % (ref 3.0–12.0)
Neutro Abs: 6.6 10*3/uL (ref 1.4–7.7)
Neutrophils Relative %: 57.8 % (ref 43.0–77.0)
Platelets: 319 10*3/uL (ref 150.0–400.0)
RBC: 3.49 Mil/uL — ABNORMAL LOW (ref 3.87–5.11)
RDW: 14.6 % (ref 11.5–15.5)
WBC: 11.4 10*3/uL — ABNORMAL HIGH (ref 4.0–10.5)

## 2023-08-31 LAB — POC URINALSYSI DIPSTICK (AUTOMATED)
Bilirubin, UA: POSITIVE
Blood, UA: POSITIVE
Glucose, UA: NEGATIVE
Ketones, UA: NEGATIVE
Nitrite, UA: NEGATIVE
Protein, UA: POSITIVE — AB
Spec Grav, UA: >=1.03 — AB (ref 1.010–1.025)
Urobilinogen, UA: 0.2 U/dL
pH, UA: 5.5 (ref 5.0–8.0)

## 2023-08-31 LAB — COMPREHENSIVE METABOLIC PANEL
ALT: 10 U/L (ref 0–35)
AST: 14 U/L (ref 0–37)
Albumin: 4.6 g/dL (ref 3.5–5.2)
Alkaline Phosphatase: 84 U/L (ref 39–117)
BUN: 21 mg/dL (ref 6–23)
CO2: 27 meq/L (ref 19–32)
Calcium: 9.3 mg/dL (ref 8.4–10.5)
Chloride: 104 meq/L (ref 96–112)
Creatinine, Ser: 0.67 mg/dL (ref 0.40–1.20)
GFR: 103.74 mL/min (ref >60.00)
Glucose, Bld: 92 mg/dL (ref 70–99)
Potassium: 3.9 meq/L (ref 3.5–5.1)
Sodium: 140 meq/L (ref 135–145)
Total Bilirubin: 0.3 mg/dL (ref 0.2–1.2)
Total Protein: 6.9 g/dL (ref 6.0–8.3)

## 2023-08-31 LAB — FERRITIN: Ferritin: 14.9 ng/mL (ref 10.0–291.0)

## 2023-08-31 LAB — GLUCOSE, POCT (MANUAL RESULT ENTRY): POC Glucose: 99 mg/dL (ref 70–99)

## 2023-08-31 MED ORDER — KETOROLAC TROMETHAMINE 60 MG/2ML IM SOLN
60.0000 mg | Freq: Once | INTRAMUSCULAR | Status: AC
  Administered 2023-08-31: 60 mg via INTRAMUSCULAR

## 2023-08-31 MED ORDER — ONDANSETRON HCL 4 MG/2ML IJ SOLN
4.0000 mg | Freq: Once | INTRAMUSCULAR | Status: AC
  Administered 2023-08-31: 4 mg via INTRAMUSCULAR

## 2023-08-31 MED ORDER — ONDANSETRON HCL 4 MG/2ML IJ SOLN
4.0000 mg | Freq: Four times a day (QID) | INTRAMUSCULAR | Status: DC | PRN
Start: 2023-08-31 — End: 2023-08-31

## 2023-08-31 NOTE — Patient Instructions (Signed)
 It was a pleasure seeing you today! Your health and satisfaction are our top priorities.  Glenetta Hew, MD  Your Providers PCP: Jeani Sow, MD,  [phone]) Referring Provider: Jeani Sow, MD,  [phone]) Care Team Provider: Shaune Leeks     NEXT STEPS: [x]  Early Intervention: Schedule sooner appointment, call our on-call services, or go to emergency room if there is any significant Increase in pain or discomfort New or worsening symptoms Sudden or severe changes in your health [x]  Flexible Follow-Up: We recommend a No follow-ups on file. for optimal routine care. This allows for progress monitoring and treatment adjustments. [x]  Preventive Care: Schedule your annual preventive care visit! It's typically covered by insurance and helps identify potential health issues early. [x]  Lab & X-ray Appointments: Incomplete tests scheduled today, or call to schedule. X-rays: [facility] (M-F, 8:30am-noon or 1pm-5pm). [x]  Medical Information Release: Sign a release form at front desk to obtain relevant medical information we don't have.  MAKING THE MOST OF OUR FOCUSED 20 MINUTE APPOINTMENTS: [x]   Clearly state your top concerns at the beginning of the visit to focus our discussion [x]   If you anticipate you will need more time, please inform the front desk during scheduling - we can book multiple appointments in the same week. [x]   If you have transportation problems- use our convenient video appointments or ask about transportation support. [x]   We can get down to business faster if you use MyChart to update information before the visit and submit non-urgent questions before your visit. Thank you for taking the time to provide details through MyChart.  Let our nurse know and she can import this information into your encounter documents.  Arrival and Wait Times: [x]   Arriving on time ensures that everyone receives prompt attention. [x]   Early morning (8a)  and afternoon (1p) appointments tend to have shortest wait times. [x]   Unfortunately, we cannot delay appointments for late arrivals or hold slots during phone calls.  Getting Answers and Following Up [x]   Simple Questions & Concerns: For quick questions or basic follow-up after your visit, reach [place] at [phone] or MyChart messaging. [x]   Complex Concerns: If your concern is more complex, scheduling an appointment might be best. Discuss this with the staff to find the most suitable option. [x]   Lab & Imaging Results: We'll contact you directly if results are abnormal or you don't use MyChart. Most normal results will be on MyChart within 2-3 business days, with a review message from Dr. Jon Billings. Haven't heard back in 2 weeks? Need results sooner? Contact us at [phone]. [x]   Referrals: Our referral coordinator will manage specialist referrals. The specialist's office should contact you within 2 weeks to schedule an appointment. Call us if you haven't heard from them after 2 weeks.  Staying Connected [x]   MyChart: Activate your MyChart for the fastest way to access results and message [place]. See the last page of this paperwork for instructions on how to activate.  Bring to Your Next Appointment [x]   Medications: Please bring all your medication bottles to your next appointment to ensure we have an accurate record of your prescriptions. [x]   Health Diaries: If you're monitoring any health conditions at home, keeping a diary of your readings can be very helpful for discussions at your next appointment.  Billing [x]   X-ray & Lab Orders: These are billed by separate companies. Contact the invoicing company directly for questions or concerns. [x]   Visit Charges: Discuss any  billing inquiries with our administrative services team.  Your Satisfaction Matters [x]   Share Your Experience: We strive for your satisfaction! If you have any complaints, or preferably compliments, please let Dr.  Jon Billings know directly or contact our Practice Administrators, Edwena Felty or [employer], by asking at the front desk.   Reviewing Your Records [x]   Review this early draft of your clinical encounter notes below and the final encounter summary tomorrow on MyChart after its been completed.  All orders placed so far are visible here: Urinary tract infection without hematuria, site unspecified -     Scopolamine; Place 1 patch (1.5 mg total) onto the skin every 3 (three) days.  Dispense: 10 patch; Refill: 2 -     POCT urinalysis dipstick -     Urinalysis, Complete; Future -     Urinalysis, Routine w reflex microscopic; Future  Nausea and vomiting, unspecified vomiting type -     CBC with Differential/Platelet -     Comprehensive metabolic panel -     Ketorolac Tromethamine -     Ketorolac Tromethamine; Take 1 tablet (10 mg total) by mouth every 6 (six) hours as needed.  Dispense: 20 tablet; Refill: 0  Iron deficiency -     Ferritin  Atrophic vaginitis -     Estradiol; Place 1 Applicatorful vaginally 3 (three) times a week.  Dispense: 42.5 g; Refill: 0  Chronic bilateral low back pain with bilateral sciatica

## 2023-08-31 NOTE — Assessment & Plan Note (Signed)
 She experiences significant pain post-physical therapy for a work-related injury, rated 7-10 out of 10. A Toradol injection is requested for pain relief before physical therapy. There are concerns about gastrointestinal side effects of Toradol, including vomiting blood, as it can exacerbate gastritis. Administer a Toradol injection and prescribe Toradol tablets for five days. Monitor for gastrointestinal side effects, including vomiting blood. Prescribe a scopolamine patch for nausea management. Schedule a follow-up to reassess pain and gastrointestinal symptoms.

## 2023-08-31 NOTE — Patient Instructions (Signed)
 Er if worse, no improvement

## 2023-08-31 NOTE — Progress Notes (Signed)
 Subjective:     Patient ID: Laurie Roman, female    DOB: [birth year], 48 y.o.   MRN: [id number]  Chief Complaint  Patient presents with   Headache    Constant headache, tense feeling in the top of head and behind the eyes   Dizziness    Dizziness with seeing white spots when turning her head that started today    HPI Discussed the use of AI scribe software for clinical note transcription with the patient, who gave verbal consent to proceed.  History of Present Illness Laurie Roman is a 48 year old female with migraines who presents with acute headaches and dizziness following a head injury.  She sustained a head injury several weeks ago after slipping and hitting her head on a door. Since then, she has experienced acute headaches and dizziness. These headaches are more severe than her usual migraines and are accompanied by neck pain, increased back pain, and nausea. She describes this headache as one of the worst she has experienced, though not as severe as a previous headache . She also experiences slurred speech and difficulty concentrating, which she attributes to dehydration. A CT scan of her head performed approximately a month after the injury showed no acute findings.  She has a history of migraines and is currently on a monthly migraine medication injection, with no missed doses. She also takes blood pressure medication, Adderall, and Vicodin for pain management. Imitrex, both oral and injectable forms, has been ineffective for her migraines, with the injectable form causing her heart to race. She has not yet tried Nurtec or [place] due to authorization issues.  She completed physical therapy for her back yesterday, which exacerbated her neck and back pain. Certain exercises caused significant discomfort, leading to modifications in her therapy regimen. She experienced increased pain in her right flank following therapy, which she associates with muscle stretching.  She reports  persistent nausea and vomiting, particularly since the head injury. She received a Toradol shot and a nausea shot yesterday, which provided some relief. She also mentions experiencing double vision and dizziness, especially when turning her head quickly.  She feels that stress, including her father's hospitalization and impending move to a nursing home, may be contributing to her symptoms. She is concerned about her symptoms and the impact on her ability to function, particularly at work, where she struggles with speech and concentration.    Health Maintenance Due  Topic Date Due   Colonoscopy  Never done    Past Medical History:  Diagnosis Date   ADD (attention deficit disorder)    Anxiety    Asthma    Blood transfusion without reported diagnosis    Depression    GERD (gastroesophageal reflux disease)    IBS (irritable bowel syndrome)    Insomnia    Lumbago    Migraine    Opioid dependence (HCC) 08/21/2022   Explained 08/21/22 that 4 months hydrocodone for pain management for delays in dental care has resulted in hyperalgesia and dependence   Other chest pain 08/17/2022   Normal EKG and D-dimer  Associated with lifting on father Doesn't radiate  Nonexertional  Not substernal chest pain more to the side, feels related to sleeping on it wrong (reproducible)   Sciatica    Sleep apnea    Tietze's disease    Urinary incontinence 10/26/2022    Past Surgical History:  Procedure Laterality Date   APPENDECTOMY     CESAREAN SECTION     TONSILLECTOMY  TOTAL ABDOMINAL HYSTERECTOMY W/ BILATERAL SALPINGOOPHORECTOMY     TUBAL LIGATION       Current Outpatient Medications:    albuterol (VENTOLIN HFA) 108 (90 Base) MCG/ACT inhaler, Inhale 2 puffs into the lungs every 6 (six) hours as needed for wheezing or shortness of breath., Disp: 18 g, Rfl: 3   amLODipine (NORVASC) 10 MG tablet, Take 1 tablet (10 mg total) by mouth daily. Replaces amlodipine 5, for uncontrolled blood pressure.,  Disp: 90 tablet, Rfl: 3   amphetamine-dextroamphetamine (ADDERALL) 20 MG tablet, Take 1 tablet (20 mg total) by mouth 2 (two) times daily., Disp: 60 tablet, Rfl: 0   ARIPiprazole (ABILIFY) 2 MG tablet, Take 1 tablet (2 mg total) by mouth daily as needed., Disp: 90 tablet, Rfl: 3   Buprenorphine HCl-Naloxone HCl (SUBOXONE) 8-2 MG FILM, Place 2 Film under the tongue daily at 6 (six) AM. At first, take only 0.25  films (2 mg opioid) each hour until minimum effective dose reached to control cravings, up to 2 films total in day max., Disp: 60 each, Rfl: 0   chlorhexidine (PERIDEX) 0.12 % solution, Use as directed 15 mLs in the mouth or throat 2 (two) times daily., Disp: 1893 mL, Rfl: 0   ciclopirox (LOPROX) 0.77 % cream, Apply topically 2 (two) times daily., Disp: 90 g, Rfl: 3   ciprofloxacin (CIPRO) 500 MG tablet, Take 1 tablet (500 mg total) by mouth 2 (two) times daily for 10 days., Disp: 20 tablet, Rfl: 0   Cyanocobalamin (B-12) 1000 MCG SUBL, Place 1 tablet under the tongue daily at 6 (six) AM., Disp: 100 tablet, Rfl: 3   Erenumab-aooe (AIMOVIG) 140 MG/ML SOAJ, Inject 140 mg into the skin every 30 (thirty) days., Disp: 1 mL, Rfl: 3   estradiol (ESTRACE VAGINAL) 0.1 MG/GM vaginal cream, Place 1 Applicatorful vaginally 3 (three) times a week., Disp: 42.5 g, Rfl: 0   fluconazole (DIFLUCAN) 150 MG tablet, Take 1 tablet (150 mg total) by mouth every three (3) days as needed., Disp: 2 tablet, Rfl: 0   fluticasone (FLONASE) 50 MCG/ACT nasal spray, Place 1 spray into both nostrils daily., Disp: 48 g, Rfl: 3   gabapentin (NEURONTIN) 600 MG tablet, Take 1 tablet (600 mg total) by mouth 3 (three) times daily., Disp: 90 tablet, Rfl: 0   gabapentin (NEURONTIN) 800 MG tablet, Take 1 tablet (800 mg total) by mouth 4 (four) times daily as needed. Replaces prior dosing schedule., Disp: 270 tablet, Rfl: 1   HYDROcodone-acetaminophen (NORCO) 10-325 MG tablet, Take 1 tablet by mouth every 8 (eight) hours as needed., Disp:  45 tablet, Rfl: 0   ketorolac (TORADOL) 10 MG tablet, Take 1 tablet (10 mg total) by mouth every 6 (six) hours as needed., Disp: 20 tablet, Rfl: 0   ketorolac (TORADOL) 10 MG tablet, Take 1 tablet (10 mg total) by mouth every 6 (six) hours as needed., Disp: 20 tablet, Rfl: 0   lidocaine (HM LIDOCAINE PATCH) 4 %, Place 1 patch onto the skin daily., Disp: 30 patch, Rfl: 1   lidocaine (XYLOCAINE) 5 % ointment, Apply 1 Application topically as needed., Disp: 35.44 g, Rfl: 0   Lidocaine 10 % CREA, Apply 1 Application topically daily at 6 (six) AM., Disp: 30 g, Rfl: 11   linaclotide (LINZESS) 145 MCG CAPS capsule, Take 1 capsule (145 mcg total) by mouth daily before breakfast., Disp: 90 capsule, Rfl: 3   LORazepam (ATIVAN) 1 MG tablet, MUST HAVE DRIVER. Arrive 1 hour prior to your MRI appt. Take  1 tablet once your driver has been confirmed. If needed, take second tablet 30 minutes prior to MRI., Disp: 2 tablet, Rfl: 0   losartan (COZAAR) 100 MG tablet, Take 1 tablet (100 mg total) by mouth daily., Disp: 90 tablet, Rfl: 1   methocarbamol (ROBAXIN) 500 MG tablet, Take 1 tablet (500 mg total) by mouth every 6 (six) hours as needed for muscle spasms., Disp: 60 tablet, Rfl: 2   methylPREDNISolone (MEDROL DOSEPAK) 4 MG TBPK tablet, See admin instructions., Disp: , Rfl:    metoprolol tartrate (LOPRESSOR) 25 MG tablet, Take 1 tablet (25 mg total) by mouth 2 (two) times daily., Disp: 180 tablet, Rfl: 0   mometasone-formoterol (DULERA) 100-5 MCG/ACT AERO, Inhale 2 puffs into the lungs 2 (two) times daily., Disp: 13 g, Rfl: 2   NONFORMULARY OR COMPOUNDED ITEM, Recommended starting JOURNAVX oral dose is 100 mg. Take the starting  dose on an empty stomach at least 1 hour before or 2 hours after food.  Clear liquids may be consumed during this time (e.g., water, apple juice,  vegetable broth, tea, black coffee). (2.1)   Starting 12 hours after the starting dose, take 50 mg of JOURNAVX orally  every 12 hours. Take these  doses with or without food. (2.1)  Use JOURNAVX for the shortest duration, consistent with individual  patient treatment goals. Use of JOURNAVX for the treatment of acute pain  has not been studied beyond 14 days.  28 DAY SUPPLY, Disp: 3 each, Rfl: 3   nystatin (MYCOSTATIN) 100000 UNIT/ML suspension, Take 5 mLs (500,000 Units total) by mouth 4 (four) times daily. Swish and hold in mouth few seconds and swallow, Disp: 60 mL, Rfl: 0   nystatin (MYCOSTATIN/NYSTOP) powder, Apply 1 Application topically 3 (three) times daily., Disp: 60 g, Rfl: 11   nystatin cream (MYCOSTATIN), Apply 1 Application topically 2 (two) times daily., Disp: 30 g, Rfl: 5   ondansetron (ZOFRAN) 4 MG tablet, Take 1 tablet (4 mg total) by mouth every 8 (eight) hours as needed for nausea or vomiting., Disp: 20 tablet, Rfl: 1   pantoprazole (PROTONIX) 40 MG tablet, Take 1 tablet (40 mg total) by mouth 2 (two) times daily before a meal., Disp: 180 tablet, Rfl: 1   phenazopyridine (PYRIDIUM) 200 MG tablet, Take 1 tablet (200 mg total) by mouth 3 (three) times daily., Disp: 60 tablet, Rfl: 0   potassium chloride (KLOR-CON M) 20 MEQ tablet, Take 1 tablet (20 mEq total) by mouth daily., Disp: 90 tablet, Rfl: 0   Probiotic Product (PROBIOTIC DAILY) CAPS, Take 1 capsule by mouth daily., Disp: 30 capsule, Rfl: 2   promethazine (PHENERGAN) 25 MG tablet, Take 25 mg by mouth every 6 (six) hours as needed., Disp: , Rfl:    Rimegepant Sulfate (NURTEC) 75 MG TBDP, Take 1 tablet (75 mg total) by mouth once a week., Disp: 30 tablet, Rfl: 2   rizatriptan (MAXALT) 10 MG tablet, Take 1 tablet (10 mg total) by mouth as needed for migraine. May repeat in 2 hours if needed, Disp: 10 tablet, Rfl: 3   scopolamine (TRANSDERM-SCOP) 1 MG/3DAYS, Place 1 patch (1.5 mg total) onto the skin every 3 (three) days., Disp: 10 patch, Rfl: 2   tiZANidine (ZANAFLEX) 4 MG tablet, Take 1 tablet (4 mg total) by mouth every 8 (eight) hours as needed for muscle spasms., Disp: 90  tablet, Rfl: 1   trimethoprim (TRIMPEX) 100 MG tablet, Take 1 tablet (100 mg total) by mouth daily., Disp: 90 tablet, Rfl: 0  Current Facility-Administered Medications:    ketorolac (TORADOL) injection 60 mg, 60 mg, Intramuscular, Once,    ondansetron (ZOFRAN) injection 4 mg, 4 mg, Intramuscular, Q6H PRN,    ondansetron (ZOFRAN-ODT) disintegrating tablet 4 mg, 4 mg, Oral, Once, [name], MD  Allergies  Allergen Reactions   Cefazolin Hives    Only IV formulation can safely take by mouth keflex   Ondansetron Hives   Other Hives    Odansetron injection.   Sulfa Antibiotics Rash   Tramadol Hcl     Other reaction(s): chest pain   Amitriptyline     hallucination   Galcanezumab-Gnlm Other (See Comments)    galcanezumab-gnlm   Ultram [Tramadol] Other (See Comments)    Tachycardia   Nitrofurantoin Itching    Red welts.  nitrofurantoin   Oxycodone Rash   Sulfasalazine Rash   ROS neg/noncontributory except as noted HPI/below      Objective:     BP (!) 140/90   Pulse (!) 103   Temp 97.7 F (36.5 C) (Temporal)   Resp 18   Ht 4\' 11"  (1.499 m)   Wt 140 lb 6 oz (63.7 kg)   SpO2 95%   BMI 28.35 kg/m  Wt Readings from Last 3 Encounters:  08/31/23 140 lb 6 oz (63.7 kg)  08/23/23 140 lb 2 oz (63.6 kg)  08/11/23 135 lb (61.2 kg)    Physical Exam   Gen: WDWN NAD-in mod distress HEENT: NCAT, conjunctiva not injected, sclera nonicteric.  Eomi but some dizziness when looked to R.  NECK:  supple, no thyromegaly, no nodes, no carotid bruits CARDIAC: RRR, S1S2+, no murmur.  EXT:  no edema MSK: no gross abnormalities. Some tight muscles in neck. NEURO: A&O x3.  CN II-XII intact.  PSYCH: normal mood. Good eye contact     Assessment & Plan:  Intractable migraine without aura and with status migrainosus -     Ketorolac Tromethamine -     Ondansetron HCl  Dysuria  Dizziness -     POCT glucose (manual entry)  Assessment and Plan Assessment & Plan Post-traumatic  headache   Carlyne experiences severe headaches, nausea, slurred speech, and dizziness following a head injury. A CT scan showed no acute findings, suggesting post-traumatic headaches, possibly worsened by dehydration and stress. She cannot access Nurtec due to authorization issues and finds no relief with Imitrex due to side effects. If symptoms worsen, an ER visit may be necessary-pt declines for now,  Encourage hydration and consider Nurtec samples if available. An ER visit is advised if symptoms escalate.  Nausea and vomiting   Persistent nausea and vomiting have been present since the head injury, likely related to headaches, dehydration, or stress. She has not taken Zofran today but tolerated a previous Zofran shot well. Encourage hydration and consider prescribing Zofran for nausea management.  Neck and back pain   Neck and back pain are worsened by physical therapy exercises, with pain rated 8 out of 10 during sessions. Stress from personal circumstances may affect pain perception. Continue physical therapy with modified exercises and schedule an MRI for further evaluation. Consider alternative pain management if current medications are ineffective.  Urinary tract infection (UTI)   She reports increased flank pain and is being evaluated for recurrent UTIs, considering factors like sexual activity and hormonal influences. Perform urinalysis and urine culture to assess for UTI, and review results to adjust antibiotics if necessary.  Stress management   Significant stress from her father's health situation affects her well-being.  She is taking Abilify and gabapentin for stress management. Encourage stress management techniques and support.    Return if symptoms worsen or fail to improve.  Angelena Sole, MD

## 2023-09-01 ENCOUNTER — Other Ambulatory Visit: Payer: Self-pay | Admitting: Family Medicine

## 2023-09-01 ENCOUNTER — Telehealth: Payer: Self-pay | Admitting: Pharmacist

## 2023-09-01 DIAGNOSIS — B3731 Acute candidiasis of vulva and vagina: Secondary | ICD-10-CM

## 2023-09-01 LAB — URINALYSIS, COMPLETE
Bilirubin Urine: NEGATIVE
Glucose, UA: NEGATIVE
Ketones, ur: NEGATIVE
Nitrite: NEGATIVE
Specific Gravity, Urine: 1.029 (ref 1.001–1.035)
pH: 5.5 (ref 5.0–8.0)

## 2023-09-01 MED ORDER — FLUCONAZOLE 150 MG PO TABS
150.0000 mg | ORAL_TABLET | ORAL | 1 refills | Status: DC | PRN
Start: 2023-09-01 — End: 2024-02-14

## 2023-09-01 NOTE — Telephone Encounter (Signed)
 Appeal has been submitted for Nurtec. Will advise when response is received, please be advised that most companies may take 30 days to make a decision.  Appeal letter and supporting documentation have been faxed to [phone] on 09/01/2023 @5 :07pm.  Thank you, Dellie Burns, PharmD Clinical Pharmacist  [place]  Direct Dial: [phone]

## 2023-09-01 NOTE — Telephone Encounter (Signed)
 Sent to PA Team.

## 2023-09-03 ENCOUNTER — Encounter: Payer: Self-pay | Admitting: Family Medicine

## 2023-09-03 ENCOUNTER — Telehealth: Payer: Self-pay

## 2023-09-03 ENCOUNTER — Other Ambulatory Visit (HOSPITAL_COMMUNITY): Payer: Self-pay

## 2023-09-03 ENCOUNTER — Other Ambulatory Visit: Payer: Self-pay | Admitting: Family Medicine

## 2023-09-03 DIAGNOSIS — I10 Essential (primary) hypertension: Secondary | ICD-10-CM | POA: Diagnosis not present

## 2023-09-03 DIAGNOSIS — R519 Headache, unspecified: Secondary | ICD-10-CM | POA: Diagnosis not present

## 2023-09-03 DIAGNOSIS — R4781 Slurred speech: Secondary | ICD-10-CM | POA: Diagnosis not present

## 2023-09-03 DIAGNOSIS — G8929 Other chronic pain: Secondary | ICD-10-CM

## 2023-09-03 DIAGNOSIS — R42 Dizziness and giddiness: Secondary | ICD-10-CM | POA: Diagnosis not present

## 2023-09-03 NOTE — Telephone Encounter (Signed)
 Pharmacy Patient Advocate Encounter   Received notification from CoverMyMeds that prior authorization for Estradiol 0.1MG /GM cream is required/requested.   Insurance verification completed.   The patient is insured through [facility] .   Per test claim: PA required; PA submitted to above mentioned insurance via CoverMyMeds Key/confirmation #/EOC [id number] Status is pending

## 2023-09-05 ENCOUNTER — Encounter: Payer: Self-pay | Admitting: Internal Medicine

## 2023-09-05 DIAGNOSIS — N390 Urinary tract infection, site not specified: Secondary | ICD-10-CM | POA: Insufficient documentation

## 2023-09-05 MED ORDER — CIPROFLOXACIN HCL 500 MG PO TABS: 500.0000 mg | ORAL_TABLET | Freq: Two times a day (BID) | ORAL | 0 refills | Status: DC

## 2023-09-05 NOTE — Addendum Note (Signed)
 Addended by: [name] on: 09/05/2023 08:21 PM   Modules accepted: Orders

## 2023-09-06 ENCOUNTER — Ambulatory Visit (INDEPENDENT_AMBULATORY_CARE_PROVIDER_SITE_OTHER): Admitting: Internal Medicine

## 2023-09-06 ENCOUNTER — Other Ambulatory Visit (HOSPITAL_BASED_OUTPATIENT_CLINIC_OR_DEPARTMENT_OTHER): Payer: Self-pay

## 2023-09-06 ENCOUNTER — Other Ambulatory Visit: Payer: Self-pay

## 2023-09-06 ENCOUNTER — Other Ambulatory Visit: Payer: Self-pay | Admitting: Family Medicine

## 2023-09-06 ENCOUNTER — Encounter: Payer: Self-pay | Admitting: Internal Medicine

## 2023-09-06 VITALS — BP 130/80 | HR 108 | Temp 98.1°F | Ht 59.0 in | Wt 141.0 lb

## 2023-09-06 DIAGNOSIS — N390 Urinary tract infection, site not specified: Secondary | ICD-10-CM

## 2023-09-06 DIAGNOSIS — D509 Iron deficiency anemia, unspecified: Secondary | ICD-10-CM | POA: Diagnosis not present

## 2023-09-06 DIAGNOSIS — M5441 Lumbago with sciatica, right side: Secondary | ICD-10-CM

## 2023-09-06 DIAGNOSIS — G8929 Other chronic pain: Secondary | ICD-10-CM | POA: Diagnosis not present

## 2023-09-06 DIAGNOSIS — R410 Disorientation, unspecified: Secondary | ICD-10-CM | POA: Diagnosis not present

## 2023-09-06 MED ORDER — METHOCARBAMOL 500 MG PO TABS
1000.0000 mg | ORAL_TABLET | Freq: Four times a day (QID) | ORAL | 0 refills | Status: DC | PRN
Start: 2023-09-06 — End: 2023-09-29

## 2023-09-06 MED ORDER — CIPROFLOXACIN HCL 500 MG PO TABS: 500.0000 mg | ORAL_TABLET | Freq: Two times a day (BID) | ORAL | 0 refills | Status: AC

## 2023-09-06 MED ORDER — VITAMIN C 500 MG PO CHEW
1.0000 | CHEWABLE_TABLET | Freq: Every day | ORAL | 3 refills | Status: DC
Start: 2023-09-06 — End: 2024-02-28

## 2023-09-06 MED ORDER — HYDROCODONE-ACETAMINOPHEN 10-325 MG PO TABS: 1.0000 | ORAL_TABLET | Freq: Three times a day (TID) | ORAL | 0 refills | Status: DC | PRN

## 2023-09-06 MED ORDER — CRANBERRY-D MANNOSE 158-500 MG PO CAPS: 1.0000 | ORAL_CAPSULE | Freq: Every day | ORAL | 3 refills | Status: DC

## 2023-09-06 MED ORDER — METHENAMINE HIPPURATE 1 G PO TABS: 1.0000 g | ORAL_TABLET | Freq: Two times a day (BID) | ORAL | 3 refills | Status: DC

## 2023-09-06 NOTE — Telephone Encounter (Signed)
 Noted.

## 2023-09-06 NOTE — Patient Instructions (Signed)
 YOUR CARE INSTRUCTIONS  Today's Diagnosis: Recurrent Urinary Tract Infection, Altered Mental Status, Chronic Back Pain Understanding Your Condition You have a urinary tract infection (UTI) that keeps coming back despite antibiotic treatment. Several factors may be contributing to your recurring infections, including your bladder sling, history of urinary crystals, and post-hysterectomy changes. The confusion and mental changes you've experienced may be related to your UTI. Medication Changes Instructions  Ciprofloxacin (Cipro) 500mg  Take 1 tablet twice daily for 10 days. IMPORTANT: Stop taking tizanidine while on this medication due to serious interaction risk.  Methenamine (Hiprex) 1g Take 1 tablet twice daily with meals. This medication helps prevent UTIs by creating an environment in your urine that's hostile to bacteria.  Vitamin C 500mg  Take 1 tablet daily in the morning with your methenamine. This helps the methenamine work better.  Cranberry-D-Mannose Take 1 capsule daily in the morning. This helps prevent bacteria from sticking to your bladder wall.  Methocarbamol (Robaxin) 500mg  Take 2 tablets every 6 hours as needed for muscle spasms related to your back pain.  Home Care Instructions FLUID INTAKE: Drink 8-10 glasses of water daily, especially at work. This is extremely important for clearing your infection and preventing future ones.  Continue using your vaginal estradiol cream 3 times weekly as prescribed. Take all medications exactly as prescribed, even if you start feeling better. Urinate immediately after sexual activity to help flush bacteria from your urinary tract. Urinate regularly throughout the day - don't "hold it" even at work. Wipe from front to back after using the toilet. Consider wearing cotton underwear and avoiding tight-fitting pants. SEEK IMMEDIATE MEDICAL ATTENTION if you experience: Fever above 101F Severe back pain (near kidneys) Confusion, difficulty speaking,  or severe headache Inability to keep fluids down Blood in your urine Severe nausea or vomiting    Follow-Up Care Urine Testing Please complete a urinalysis with culture in 1 week after finishing your antibiotic course.  Office Visit Return to clinic in 2 weeks to check your progress.  Specialist Referral You've been referred to urology for evaluation of your bladder sling and to help manage your recurrent UTIs.  MEDICATION TRACKING: Consider keeping a journal of your symptoms, fluid intake, and when you take your medications to help [place] identify patterns and better manage your care.              Hiprex (methenamine hippurate) helps prevent urinary tract infections (UTIs) by acting as a urinary antiseptic rather than a traditional antibiotic. Here's how it works: Mechanism of Action: Acidification of Urine Methenamine itself is inactive but is converted into formaldehyde in acidic urine (pH <5.5). Formaldehyde is a potent antibacterial agent that kills bacteria in the urinary tract. Bactericidal Effect Unlike antibiotics, formaldehyde does not lead to bacterial resistance. It disrupts bacterial proteins and DNA, preventing bacterial growth and colonization in the bladder. Combination with Acidifiers Hiprex is often prescribed with vitamin C (ascorbic acid) or cranberry supplements to help maintain acidic urine, ensuring maximum effectiveness. Who Benefits from Hiprex? Patients with recurrent UTIs Those needing antibiotic-sparing strategies Patients with neurogenic bladder or chronic catheter use Key Considerations: Not effective for active infections (only for prevention). Avoid in renal or hepatic impairment (risk of formaldehyde toxicity). Works best when urine is acidic (pH monitoring may help). Would you like guidance on dosing, monitoring, or patient counseling tips?

## 2023-09-06 NOTE — Telephone Encounter (Signed)
 2 weeks ago (08/23/2023) by Jeani Sow, MD LOV 08/31/2023

## 2023-09-06 NOTE — Progress Notes (Signed)
 Insurance and availablity issues.  Send to [facility]

## 2023-09-07 NOTE — Assessment & Plan Note (Signed)
 Assessment: Long-standing bilateral low back pain with sciatica, managed with high-dose gabapentin, tizanidine, NSAIDs, and methocarbamol. Recent lumbar spine imaging on 08/03/2023 showed no acute findings. Current pain management regimen provides inadequate relief, possibly due to medication tolerance. Medication  Refill methocarbamol (Robaxin) 500mg , 2 tablets every 6 hours as needed for muscle spasms  Continue current pain management regimen including gabapentin, tizanidine, and NSAIDs  Advise patient to temporarily discontinue tizanidine while taking ciprofloxacin due to interaction risk   Monitoring  Reassess effectiveness of current pain management at next visit  Consider physical therapy referral if UTI symptoms improve  Monitor for pain medication side effects, particularly in light of history of opioid dependence

## 2023-09-07 NOTE — Progress Notes (Signed)
 ==============================  [facility]: [phone]   -- Medical Office Visit --  Patient: Laurie Roman      Age: 48 y.o.       Sex:  female  Date:   09/06/2023 Today's Healthcare Provider: [name], MD  ==============================   Chief Complaint: Hospitalization Follow-up  Recurrent/persistent urinary tract infection (UTI) with AMS.  History of Present Illness The patient is a 48 year old female with a history of recurrent urinary tract infections who presents for hospital follow-up with persistent UTI symptoms and altered mental status.  She reports ongoing symptoms of urinary tract infection including pelvic pressure and frequent urination with small volumes despite multiple courses of antibiotics. She has been treated with trimethoprim and ciprofloxacin without resolution of her infection. Her symptoms temporarily improved following recent hospital treatment but have since returned. Her most recent urinalysis from 08/31/2023 shows continued infection with WBCs 40-60/HPF, moderate bacteria, and few calcium oxalate crystals. A urine culture from 08/17/2023 demonstrated E. coli sensitive to multiple antibiotics.  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 She also has a significant history of urinary incontinence, which she reports has been chronic and worsening.  The patient describes episodes of altered mental status which she attributes to her UTI. She reports experiencing confusion during conversations, making nonsensical statements, and having headaches with slurred speech. These symptoms were severe enough to prompt a stroke alert and MRI at the hospital, which did not  show any acute findings. She notes her grandmother experienced similar altered mental status with UTIs, suggesting possible genetic predisposition. She questions whether her current UTI is contributing to these neurological symptoms.  Her current medication regimen includes trimethoprim for UTI prophylaxis, methocarbamol (Robaxin) for pain management related to her chronic back pain, and tizanidine at night for its muscle relaxant and sedative effects. Despite these treatments, she continues to experience significant symptoms.  The patient has a complex medical history including chronic bilateral low back pain with sciatica, opioid dependence (currently managed with Suboxone), asthma, depression, anxiety, GERD, insomnia, migraines, and essential hypertension. Laboratory studies from 08/31/2023 reveal mild anemia with hemoglobin of 10.5 g/dL and low ferritin (09.8). Recent vital signs show tachycardia with heart rate 108 and blood pressure 130/80.   Background: Reviewed: She has Migraine; ADHD; Essential hypertension; Mild obstructive sleep apnea; Controlled substance agreement signed; Gastroesophageal reflux disease without esophagitis; Left sided sciatica; Asthma; Maxillary sinusitis; Anxiety; Primary insomnia; Depression, major, single episode, severe (HCC); Cervicalgia; Chronic dental pain; Opioid dependence (HCC); B12 deficiency; Urinary incontinence; Flank pain; Uric acid crystalluria; Right sided sciatica; Intertrigo; Chronic bilateral low back pain with sciatica; Chronic right hip pain; Tachycardia; Drug-induced constipation; Grief reaction with prolonged bereavement; and Recurrent UTI on their problem list.  Reviewed: She   has a past medical history of ADD (attention deficit disorder), Anxiety, Asthma, Blood transfusion without reported diagnosis, Depression, GERD (gastroesophageal reflux disease), IBS (irritable bowel syndrome), Insomnia, Lumbago, Migraine, Opioid dependence (HCC) (08/21/2022),  Other chest pain (08/17/2022), Sciatica, Sleep apnea, Tietze's disease, and Urinary incontinence (10/26/2022).  Manually updated: No problems updated.  Reviewed:  Allergies as of 09/06/2023 - Review Complete 08/31/2023  Allergen Reaction Noted   Cefazolin Hives 04/28/2014   Ondansetron Hives 03/24/2015   Other Hives 03/16/2023   Sulfa antibiotics Rash 06/21/2015  Tramadol hcl  03/27/2022   Amitriptyline  08/23/2023   Galcanezumab-gnlm Other (See Comments) 12/24/2017   Ultram [tramadol] Other (See Comments) 06/21/2015   Nitrofurantoin Itching 02/11/2023   Oxycodone Rash 12/31/2022   Sulfasalazine Rash 06/21/2015    Medications: Reviewed: Current Outpatient Medications on File Prior to Visit  Medication Sig   albuterol (VENTOLIN HFA) 108 (90 Base) MCG/ACT inhaler Inhale 2 puffs into the lungs every 6 (six) hours as needed for wheezing or shortness of breath.   amLODipine (NORVASC) 10 MG tablet Take 1 tablet (10 mg total) by mouth daily. Replaces amlodipine 5, for uncontrolled blood pressure.   amphetamine-dextroamphetamine (ADDERALL) 20 MG tablet Take 1 tablet (20 mg total) by mouth 2 (two) times daily.   ARIPiprazole (ABILIFY) 2 MG tablet Take 1 tablet (2 mg total) by mouth daily as needed.   Buprenorphine HCl-Naloxone HCl (SUBOXONE) 8-2 MG FILM Place 2 Film under the tongue daily at 6 (six) AM. At first, take only 0.25  films (2 mg opioid) each hour until minimum effective dose reached to control cravings, up to 2 films total in day max.   chlorhexidine (PERIDEX) 0.12 % solution Use as directed 15 mLs in the mouth or throat 2 (two) times daily.   ciclopirox (LOPROX) 0.77 % cream Apply topically 2 (two) times daily.   Cyanocobalamin (B-12) 1000 MCG SUBL Place 1 tablet under the tongue daily at 6 (six) AM.   Erenumab-aooe (AIMOVIG) 140 MG/ML SOAJ Inject 140 mg into the skin every 30 (thirty) days.   estradiol (ESTRACE VAGINAL) 0.1 MG/GM vaginal cream Place 1 Applicatorful vaginally 3  (three) times a week.   fluconazole (DIFLUCAN) 150 MG tablet Take 1 tablet (150 mg total) by mouth every three (3) days as needed.   fluticasone (FLONASE) 50 MCG/ACT nasal spray Place 1 spray into both nostrils daily.   gabapentin (NEURONTIN) 600 MG tablet Take 1 tablet (600 mg total) by mouth 3 (three) times daily.   gabapentin (NEURONTIN) 800 MG tablet Take 1 tablet (800 mg total) by mouth 4 (four) times daily as needed. Replaces prior dosing schedule.   ketorolac (TORADOL) 10 MG tablet Take 1 tablet (10 mg total) by mouth every 6 (six) hours as needed.   ketorolac (TORADOL) 10 MG tablet Take 1 tablet (10 mg total) by mouth every 6 (six) hours as needed.   lidocaine (HM LIDOCAINE PATCH) 4 % Place 1 patch onto the skin daily.   lidocaine (XYLOCAINE) 5 % ointment Apply 1 Application topically as needed.   Lidocaine 10 % CREA Apply 1 Application topically daily at 6 (six) AM.   linaclotide (LINZESS) 145 MCG CAPS capsule Take 1 capsule (145 mcg total) by mouth daily before breakfast.   LORazepam (ATIVAN) 1 MG tablet MUST HAVE DRIVER. Arrive 1 hour prior to your MRI appt. Take 1 tablet once your driver has been confirmed. If needed, take second tablet 30 minutes prior to MRI.   losartan (COZAAR) 100 MG tablet Take 1 tablet (100 mg total) by mouth daily.   methylPREDNISolone (MEDROL DOSEPAK) 4 MG TBPK tablet See admin instructions.   metoprolol tartrate (LOPRESSOR) 25 MG tablet Take 1 tablet (25 mg total) by mouth 2 (two) times daily.   mometasone-formoterol (DULERA) 100-5 MCG/ACT AERO Inhale 2 puffs into the lungs 2 (two) times daily.   NONFORMULARY OR COMPOUNDED ITEM Recommended starting JOURNAVX oral dose is 100 mg. Take the starting  dose on an empty stomach at least 1 hour before or 2 hours after food.  Clear  liquids may be consumed during this time (e.g., water, apple juice,  vegetable broth, tea, black coffee). (2.1)   Starting 12 hours after the starting dose, take 50 mg of JOURNAVX orally   every 12 hours. Take these doses with or without food. (2.1)  Use JOURNAVX for the shortest duration, consistent with individual  patient treatment goals. Use of JOURNAVX for the treatment of acute pain  has not been studied beyond 14 days.  28 DAY SUPPLY   nystatin (MYCOSTATIN) 100000 UNIT/ML suspension Take 5 mLs (500,000 Units total) by mouth 4 (four) times daily. Swish and hold in mouth few seconds and swallow   nystatin (MYCOSTATIN/NYSTOP) powder Apply 1 Application topically 3 (three) times daily.   nystatin cream (MYCOSTATIN) Apply 1 Application topically 2 (two) times daily.   ondansetron (ZOFRAN) 4 MG tablet Take 1 tablet (4 mg total) by mouth every 8 (eight) hours as needed for nausea or vomiting.   pantoprazole (PROTONIX) 40 MG tablet Take 1 tablet (40 mg total) by mouth 2 (two) times daily before a meal.   phenazopyridine (PYRIDIUM) 200 MG tablet Take 1 tablet (200 mg total) by mouth 3 (three) times daily.   potassium chloride (KLOR-CON M) 20 MEQ tablet Take 1 tablet (20 mEq total) by mouth daily.   Probiotic Product (PROBIOTIC DAILY) CAPS Take 1 capsule by mouth daily.   promethazine (PHENERGAN) 25 MG tablet Take 25 mg by mouth every 6 (six) hours as needed.   Rimegepant Sulfate (NURTEC) 75 MG TBDP Take 1 tablet (75 mg total) by mouth once a week.   rizatriptan (MAXALT) 10 MG tablet Take 1 tablet (10 mg total) by mouth as needed for migraine. May repeat in 2 hours if needed   scopolamine (TRANSDERM-SCOP) 1 MG/3DAYS Place 1 patch (1.5 mg total) onto the skin every 3 (three) days.   trimethoprim (TRIMPEX) 100 MG tablet Take 1 tablet (100 mg total) by mouth daily.   Current Facility-Administered Medications on File Prior to Visit  Medication   ondansetron (ZOFRAN-ODT) disintegrating tablet 4 mg   Medications Discontinued During This Encounter  Medication Reason   tiZANidine (ZANAFLEX) 4 MG tablet Completed Course   methocarbamol (ROBAXIN) 500 MG tablet Reorder   ciprofloxacin  (CIPRO) 500 MG tablet Reorder         Physical Exam:    09/06/2023    4:12 PM 08/31/2023   11:42 AM 08/30/2023    1:57 PM  Vitals with BMI  Height 4\' 11"  4\' 11"  4\' 11"   Weight 141 lbs 140 lbs 6 oz   BMI 28.46 28.34   Systolic 130 140   Diastolic 80 90   Pulse 108 103    Wt Readings from Last 10 Encounters:  09/06/23 141 lb (64 kg)  08/31/23 140 lb 6 oz (63.7 kg)  08/23/23 140 lb 2 oz (63.6 kg)  08/11/23 135 lb (61.2 kg)  07/07/23 149 lb 6 oz (67.8 kg)  06/16/23 138 lb 15.7 oz (63 kg)  05/18/23 142 lb 6 oz (64.6 kg)  05/10/23 141 lb (64 kg)  04/21/23 141 lb 9.6 oz (64.2 kg)  04/19/23 141 lb 9.6 oz (64.2 kg)  Vital signs reviewed.  Nursing notes reviewed. Weight trend reviewed. Physical Exam  Physical Exam   Today, mental status seems sharp.  General Appearance:  No acute distress appreciable.   Well-groomed, healthy-appearing female.  Well proportioned with no abnormal fat distribution.  Good muscle tone. Pulmonary:  Normal work of breathing at rest, no respiratory distress apparent. SpO2:  98 %  Musculoskeletal: All extremities are intact.  Neurological:  Awake, alert, oriented, and engaged.  No obvious focal neurological deficits or cognitive impairments.  Sensorium seems unclouded.   Speech is clear and coherent with logical content. Psychiatric:  Appropriate mood, pleasant and cooperative demeanor, thoughtful and engaged during the exam    No results found for any visits on 09/06/23. Orders Only on 08/31/2023  Component Date Value   Color, Urine 08/31/2023 YELLOW    APPearance 08/31/2023 CLOUDY (A)    Specific Gravity, Urine 08/31/2023 1.029    pH 08/31/2023 5.5    Glucose, UA 08/31/2023 NEGATIVE    Bilirubin Urine 08/31/2023 NEGATIVE    Ketones, ur 08/31/2023 NEGATIVE    Hgb urine dipstick 08/31/2023 1+ (A)    Protein, ur 08/31/2023 TRACE (A)    Nitrite 08/31/2023 NEGATIVE    Leukocytes,Ua 08/31/2023 2+ (A)    WBC, UA 08/31/2023 40-60 (A)    RBC / HPF  08/31/2023 3-10 (A)    Squamous Epithelial / HPF 08/31/2023 10-20 (A)    Bacteria, UA 08/31/2023 MODERATE (A)    Calcium Oxalate Crystal 08/31/2023 FEW    Hyaline Cast 08/31/2023 0-5 (A)    Yeast 08/31/2023 FEW (A)    Note 08/31/2023    Office Visit on 08/31/2023  Component Date Value   POC Glucose 08/31/2023 99   Office Visit on 08/30/2023  Component Date Value   Ferritin 08/31/2023 14.9    WBC 08/31/2023 11.4 (H)    RBC 08/31/2023 3.49 (L)    Hemoglobin 08/31/2023 10.5 (L)    HCT 08/31/2023 31.1 (L)    MCV 08/31/2023 89.0    MCHC 08/31/2023 33.8    RDW 08/31/2023 14.6    Platelets 08/31/2023 319.0    Neutrophils Relative % 08/31/2023 57.8    Lymphocytes Relative 08/31/2023 31.0    Monocytes Relative 08/31/2023 7.7    Eosinophils Relative 08/31/2023 3.0    Basophils Relative 08/31/2023 0.5    Neutro Abs 08/31/2023 6.6    Lymphs Abs 08/31/2023 3.5    Monocytes Absolute 08/31/2023 0.9    Eosinophils Absolute 08/31/2023 0.3    Basophils Absolute 08/31/2023 0.1    Sodium 08/31/2023 140    Potassium 08/31/2023 3.9    Chloride 08/31/2023 104    CO2 08/31/2023 27    Glucose, Bld 08/31/2023 92    BUN 08/31/2023 21    Creatinine, Ser 08/31/2023 0.67    Total Bilirubin 08/31/2023 0.3    Alkaline Phosphatase 08/31/2023 84    AST 08/31/2023 14    ALT 08/31/2023 10    Total Protein 08/31/2023 6.9    Albumin 08/31/2023 4.6    GFR 08/31/2023 103.74    Calcium 08/31/2023 9.3    Color, UA 08/31/2023 YELLOW    Clarity, UA 08/31/2023 DARK    Glucose, UA 08/31/2023 Negative    Bilirubin, UA 08/31/2023 POS    Ketones, UA 08/31/2023 NEG    Spec Grav, UA 08/31/2023 >=1.030 (A)    Blood, UA 08/31/2023 POS    pH, UA 08/31/2023 5.5    Protein, UA 08/31/2023 Positive (A)    Urobilinogen, UA 08/31/2023 0.2    Nitrite, UA 08/31/2023 NEG    Leukocytes, UA 08/31/2023 Trace (A)   Office Visit on 08/16/2023  Component Date Value   Color, UA 08/16/2023 -    Clarity, UA 08/16/2023 -     Glucose, UA 08/16/2023 Negative    Bilirubin, UA 08/16/2023 -    Ketones, UA 08/16/2023 -  Blood, UA 08/16/2023 -    pH, UA 08/16/2023 6.0    Protein, UA 08/16/2023 Negative    Urobilinogen, UA 08/16/2023 negative (A)    Nitrite, UA 08/16/2023 +    Leukocytes, UA 08/16/2023 Small (1+) (A)    Appearance 08/16/2023 -    Odor 08/16/2023 odor    MICRO NUMBER: 08/17/2023 [id number]    SPECIMEN QUALITY: 08/17/2023 Adequate    Sample Source 08/17/2023 URINE    STATUS: 08/17/2023 FINAL    ISOLATE 1: 08/17/2023 Escherichia coli (A)   Lab on 06/25/2023  Component Date Value   WBC 06/25/2023 8.7    RBC 06/25/2023 3.61 (L)    Hemoglobin 06/25/2023 11.0 (L)    HCT 06/25/2023 32.5 (L)    MCV 06/25/2023 90.0    MCHC 06/25/2023 33.8    RDW 06/25/2023 13.6    Platelets 06/25/2023 334.0    Neutrophils Relative % 06/25/2023 51.7    Lymphocytes Relative 06/25/2023 39.4    Monocytes Relative 06/25/2023 6.4    Eosinophils Relative 06/25/2023 2.1    Basophils Relative 06/25/2023 0.4    Neutro Abs 06/25/2023 4.5    Lymphs Abs 06/25/2023 3.4    Monocytes Absolute 06/25/2023 0.6    Eosinophils Absolute 06/25/2023 0.2    Basophils Absolute 06/25/2023 0.0    Sodium 06/25/2023 139    Potassium 06/25/2023 3.9    Chloride 06/25/2023 101    CO2 06/25/2023 29    Glucose, Bld 06/25/2023 96    BUN 06/25/2023 9    Creatinine, Ser 06/25/2023 0.59    Total Bilirubin 06/25/2023 0.2    Alkaline Phosphatase 06/25/2023 74    AST 06/25/2023 19    ALT 06/25/2023 15    Total Protein 06/25/2023 7.1    Albumin 06/25/2023 4.6    GFR 06/25/2023 107.10    Calcium 06/25/2023 9.4    Folate 06/25/2023 8.1    Iron 06/25/2023 70    Transferrin 06/25/2023 269.0    Saturation Ratios 06/25/2023 18.6 (L)    Ferritin 06/25/2023 18.0    TIBC 06/25/2023 376.6   Office Visit on 05/10/2023  Component Date Value   Color, Urine 05/10/2023 YELLOW    APPearance 05/10/2023 CLOUDY (A)    Specific Gravity, Urine 05/10/2023  1.021    pH 05/10/2023 5.5    Glucose, UA 05/10/2023 NEGATIVE    Bilirubin Urine 05/10/2023 NEGATIVE    Ketones, ur 05/10/2023 NEGATIVE    Hgb urine dipstick 05/10/2023 TRACE (A)    Protein, ur 05/10/2023 NEGATIVE    Nitrites, Initial 05/10/2023 POSITIVE (A)    Leukocyte Esterase 05/10/2023 TRACE (A)    WBC, UA 05/10/2023 6-10 (A)    RBC / HPF 05/10/2023 3-10 (A)    Squamous Epithelial / HPF 05/10/2023 10-20 (A)    Bacteria, UA 05/10/2023 MANY (A)    Hyaline Cast 05/10/2023 NONE SEEN    Yeast 05/10/2023 FEW (A)    Note 05/10/2023     Color, UA 05/10/2023 yellow    Clarity, UA 05/10/2023 cloudy    Glucose, UA 05/10/2023 Negative    Bilirubin, UA 05/10/2023 Negative    Ketones, UA 05/10/2023 Negative    Spec Grav, UA 05/10/2023 >=1.030 (A)    Blood, UA 05/10/2023 Negative    pH, UA 05/10/2023 5.5    Protein, UA 05/10/2023 Negative    Urobilinogen, UA 05/10/2023 0.2    Nitrite, UA 05/10/2023 Positive    Leukocytes, UA 05/10/2023 Negative    MICRO NUMBER: 05/10/2023 [id number]    SPECIMEN QUALITY: 05/10/2023 Adequate  Sample Source 05/10/2023 URINE    STATUS: 05/10/2023 FINAL    ISOLATE 1: 05/10/2023 Escherichia coli (A)    REFLEXIVE URINE CULTURE 05/10/2023    Office Visit on 04/19/2023  Component Date Value   MICRO NUMBER: 04/19/2023 [id number]    SPECIMEN QUALITY: 04/19/2023 Adequate    Sample Source 04/19/2023 NOT GIVEN    STATUS: 04/19/2023 FINAL    Result: 04/19/2023                     Value:Mixed genital flora isolated. These superficial bacteria are not indicative of a urinary tract infection. No further organism identification is warranted on this specimen. If clinically indicated, recollect clean-catch, mid-stream urine and transfer  immediately to Urine Culture Transport Tube.    Color, UA 04/19/2023 yellow    Clarity, UA 04/19/2023 cloudy    Glucose, UA 04/19/2023 Negative    Bilirubin, UA 04/19/2023 Negative    Ketones, UA 04/19/2023 Negative    Spec Grav, UA  04/19/2023 >=1.030 (A)    Blood, UA 04/19/2023 Positive    pH, UA 04/19/2023 5.0    Protein, UA 04/19/2023 Positive (A)    Urobilinogen, UA 04/19/2023 0.2    Nitrite, UA 04/19/2023 Negative    Leukocytes, UA 04/19/2023 Moderate (2+) (A)    Iron 04/19/2023 24 (L)    TIBC 04/19/2023 326    %SAT 04/19/2023 7 (L)    Ferritin 04/19/2023 25    Glucose, Bld 04/19/2023 76    BUN 04/19/2023 20    Creat 04/19/2023 0.64    BUN/Creatinine Ratio 04/19/2023 SEE NOTE:    Sodium 04/19/2023 139    Potassium 04/19/2023 4.7    Chloride 04/19/2023 103    CO2 04/19/2023 23    Calcium 04/19/2023 9.2    Total Protein 04/19/2023 7.0    Albumin 04/19/2023 4.4    Globulin 04/19/2023 2.6    AG Ratio 04/19/2023 1.7    Total Bilirubin 04/19/2023 0.2    Alkaline phosphatase (AP* 04/19/2023 77    AST 04/19/2023 16    ALT 04/19/2023 12    WBC 04/19/2023 12.9 (H)    RBC 04/19/2023 3.51 (L)    Hemoglobin 04/19/2023 10.7 (L)    HCT 04/19/2023 32.4 (L)    MCV 04/19/2023 92.3    MCH 04/19/2023 30.5    MCHC 04/19/2023 33.0    RDW 04/19/2023 12.5    Platelets 04/19/2023 290    MPV 04/19/2023 12.6 (H)    Neutro Abs 04/19/2023 8,940 (H)    Absolute Lymphocytes 04/19/2023 2,619    Absolute Monocytes 04/19/2023 1,109 (H)    Eosinophils Absolute 04/19/2023 168    Basophils Absolute 04/19/2023 65    Neutrophils Relative % 04/19/2023 69.3    Total Lymphocyte 04/19/2023 20.3    Monocytes Relative 04/19/2023 8.6    Eosinophils Relative 04/19/2023 1.3    Basophils Relative 04/19/2023 0.5    Smear Review 04/19/2023     Color, Urine 04/19/2023 YELLOW    APPearance 04/19/2023 CLOUDY (A)    Specific Gravity, Urine 04/19/2023 1.025    pH 04/19/2023 < OR = 5.0    Glucose, UA 04/19/2023 NEGATIVE    Bilirubin Urine 04/19/2023 NEGATIVE    Ketones, ur 04/19/2023 NEGATIVE    Hgb urine dipstick 04/19/2023 NEGATIVE    Protein, ur 04/19/2023 TRACE (A)    Nitrite 04/19/2023 POSITIVE (A)    Leukocytes,Ua 04/19/2023 2+ (A)     WBC, UA 04/19/2023 40-60 (A)    RBC / HPF 04/19/2023 0-2  Squamous Epithelial / HPF 04/19/2023 10-20 (A)    Bacteria, UA 04/19/2023 MANY (A)    Calcium Oxalate Crystal 04/19/2023 MANY (A)    Hyaline Cast 04/19/2023 0-5 (A)    Vitamin B-12 04/19/2023 644    Folate 04/19/2023 5.0 (L)    Note 04/19/2023    Orders Only on 03/31/2023  Component Date Value   Glucose, Bld 03/31/2023 97    BUN 03/31/2023 14    Creat 03/31/2023 0.64    BUN/Creatinine Ratio 03/31/2023 SEE NOTE:    Sodium 03/31/2023 140    Potassium 03/31/2023 3.7    Chloride 03/31/2023 99    CO2 03/31/2023 26    Calcium 03/31/2023 10.2    Total Protein 03/31/2023 8.3 (H)    Albumin 03/31/2023 4.8    Globulin 03/31/2023 3.5    AG Ratio 03/31/2023 1.4    Total Bilirubin 03/31/2023 0.3    Alkaline phosphatase (AP* 03/31/2023 74    AST 03/31/2023 17    ALT 03/31/2023 9    Vitamin B-12 03/31/2023 865    WBC 03/31/2023 13.7 (H)    RBC 03/31/2023 3.94    Hemoglobin 03/31/2023 12.1    HCT 03/31/2023 36.9    MCV 03/31/2023 93.7    MCH 03/31/2023 30.7    MCHC 03/31/2023 32.8    RDW 03/31/2023 12.3    Platelets 03/31/2023 381    MPV 03/31/2023 11.2    Neutro Abs 03/31/2023 9,302 (H)    Absolute Lymphocytes 03/31/2023 3,192    Absolute Monocytes 03/31/2023 877    Eosinophils Absolute 03/31/2023 247    Basophils Absolute 03/31/2023 82    Neutrophils Relative % 03/31/2023 67.9    Total Lymphocyte 03/31/2023 23.3    Monocytes Relative 03/31/2023 6.4    Eosinophils Relative 03/31/2023 1.8    Basophils Relative 03/31/2023 0.6   Office Visit on 03/26/2023  Component Date Value   Path Review 03/31/2023 8   Clinical Support on 03/03/2023  Component Date Value   WBC 03/05/2023 12.1 (H)    RBC 03/05/2023 3.97    Hemoglobin 03/05/2023 12.2    HCT 03/05/2023 36.2    MCV 03/05/2023 91.2    MCH 03/05/2023 30.7    MCHC 03/05/2023 33.7    RDW 03/05/2023 12.8    Platelets 03/05/2023 399    MPV 03/05/2023 11.2    Neutro  Abs 03/05/2023 8,797 (H)    Lymphs Abs 03/05/2023 2,190    Absolute Monocytes 03/05/2023 883    Eosinophils Absolute 03/05/2023 145    Basophils Absolute 03/05/2023 85    Neutrophils Relative % 03/05/2023 72.7    Total Lymphocyte 03/05/2023 18.1    Monocytes Relative 03/05/2023 7.3    Eosinophils Relative 03/05/2023 1.2    Basophils Relative 03/05/2023 0.7   There may be more visits with results that are not included.  No image results found. DG Lumbar Spine Complete Result Date: 08/03/2023 CLINICAL DATA:  Lumbar back pain from a fall EXAM: LUMBAR SPINE - COMPLETE 4+ VIEW COMPARISON:  09/06/2013 FINDINGS: There is no evidence of lumbar spine fracture. Alignment is normal. Intervertebral disc spaces are maintained. IMPRESSION: Negative. Electronically Signed   By: Corlis Leak M.D.   On: 08/03/2023 12:38      Results LABS Urine culture: moderate amount of bacteria (08/31/2023) Urinalysis: specific gravity high, trace leukocytes, blood, moderate amount of bacteria (08/31/2023) CBC: WBC 9.2 (09/03/2023)  RADIOLOGY MRI: normal (09/03/2023)     Assessment & Plan Recurrent UTI Assessment: Patient with history of recurrent UTIs resistant  to multiple antibiotics, including cephalexin and ciprofloxacin. Despite cultures showing bacteria susceptible to antibiotics, infections persist. Contributing factors include a 48 year old bladder sling (potential bacterial colonization site), post-hysterectomy atrophic vaginitis, recurrent urinary crystals, limited fluid intake due to occupational constraints, and recent sexual activity. Current UA shows WBCs 40-60/HPF, moderate bacteria, and few calcium oxalate crystals. The patient's altered mental status may be related to the UTI, as similar symptoms occurred with previous infections, particularly noting a family history of UTI-induced altered mental status. Medication Changes  Start methenamine (Hiprex) 1g twice daily with meals  Add vitamin C 500mg  daily  to enhance methenamine effectiveness  Prescribe ciprofloxacin 500mg  twice daily for 10 days  Continue trimethoprim 100mg  daily prophylaxis after ciprofloxacin course  Add cranberry-D-mannose supplement daily   Testing  Order urinalysis with microscopy and reflex culture  Follow-up UA/culture one week after completing antibiotics   Specialist Care  Refer to urology for evaluation of bladder sling and consideration of urodynamic testing  Continue vaginal estradiol 0.1% cream 3x weekly for atrophic vaginitis   Patient Education  Emphasize increased fluid intake, especially during work hours  Discuss urination after sexual activity  Recommend regular bathroom breaks despite work constraints    Chronic bilateral low back pain with right-sided sciatica Assessment: Long-standing bilateral low back pain with sciatica, managed with high-dose gabapentin, tizanidine, NSAIDs, and methocarbamol. Recent lumbar spine imaging on 08/03/2023 showed no acute findings. Current pain management regimen provides inadequate relief, possibly due to medication tolerance. Medication  Refill methocarbamol (Robaxin) 500mg , 2 tablets every 6 hours as needed for muscle spasms  Continue current pain management regimen including gabapentin, tizanidine, and NSAIDs  Advise patient to temporarily discontinue tizanidine while taking ciprofloxacin due to interaction risk   Monitoring  Reassess effectiveness of current pain management at next visit  Consider physical therapy referral if UTI symptoms improve  Monitor for pain medication side effects, particularly in light of history of opioid dependence    Delirium Assessment: Patient prior to emergency room experienced confusion, nonsensical speech, and headaches likely related to current UTI. MRI during recent hospitalization ruled out acute neurological events including stroke. Symptoms of altered mental status with UTIs have occurred previously and are also noted in family  history. Approach  Address underlying UTI as primary treatment approach  Ensure adequate hydration to prevent dehydration-induced cognitive changes  Monitor mental status as UTI treatment progresses   Education  Educate patient on early recognition of altered mental status as potential UTI symptom  Document baseline mental status for future comparison    Iron deficiency anemia, unspecified iron deficiency anemia type Assessment: Laboratory studies from 08/31/2023 demonstrate iron deficiency anemia with hemoglobin 10.5 g/dL (decreased from previous values) and low ferritin (14.9). This may contribute to fatigue and exacerbate other symptoms.  She is to follow up Primary Care Provider (PCP) for management. Plan  Address underlying causes including possible blood loss from chronic UTIs  Consider oral iron supplementation at follow-up visit if values remain low  Monitor CBC and iron studies at next visit  Dietary counseling regarding iron-rich foods   FOLLOW-UP PLAN:  Urinalysis with culture in 1 week after completing antibiotics  Office visit in 2 weeks to assess symptom improvement  Coordinate with urology for evaluation of bladder sling  Monitor for treatment side effects, particularly with multiple medication changes        Orders Placed During this Encounter:   Orders Placed This Encounter  Procedures   Urinalysis w microscopic + reflex cultur   Ambulatory referral  to Urology    Referral Priority:   Routine    Referral Type:   Consultation    Referral Reason:   Specialty Services Required    Requested Specialty:   Urology    Number of Visits Requested:   1   Meds ordered this encounter  Medications   ciprofloxacin (CIPRO) 500 MG tablet    Sig: Take 1 tablet (500 mg total) by mouth 2 (two) times daily for 10 days. Stop zanaflex tizanidine while taking    Dispense:  20 tablet    Refill:  0   methenamine (HIPREX) 1 g tablet    Sig: Take 1 tablet (1 g total) by mouth 2 (two) times  daily with a meal.    Dispense:  68 tablet    Refill:  3   methocarbamol (ROBAXIN) 500 MG tablet    Sig: Take 2 tablets (1,000 mg total) by mouth every 6 (six) hours as needed for muscle spasms.    Dispense:  180 tablet    Refill:  0   Cranberry-D Mannose 158-500 MG CAPS    Sig: Take 1 Capful by mouth daily at 6 (six) AM.    Dispense:  90 capsule    Refill:  3   Ascorbic Acid (VITAMIN C) 500 MG CHEW    Sig: Chew 1 tablet (500 mg total) by mouth daily at 6 (six) AM. Take with methenamine    Dispense:  90 tablet    Refill:  3   ED Discharge Orders          Ordered    ciprofloxacin (CIPRO) 500 MG tablet  2 times daily        09/06/23 1652    methenamine (HIPREX) 1 g tablet  2 times daily with meals        09/06/23 1652    Ambulatory referral to Urology       Comments: [facility] last saw 05/2023, request 2nd opinion Active Infection Status   urinalysis showed no improvement with ciprofloxacin. Current UA (08/31/23):  WBCs 40-60/HPF  Moderate bacteria  Few calcium oxalate crystals  Failed recent cephalexin treatment (08/16/23) Culture (08/17/23): E. 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)  Recent sexual activity  Reports 5+ UTIs per year  Occupation: Phlebotomist (limited bathroom access) PCP Action Items:   Follow-up UA/culture in 1 week after completing antibiotics  Address iron deficiency (Ferritin 14.9, Hgb 10.5)  Assess prophylaxis effectiveness at 1 month  Quarterly UA monitoring for first year Urology Action Items:    Complete urodynamic testing once approved  Re Evaluate need for sling revision/removal  Consider post-coital prophylaxis  Re Assess for chronic retention  Consider pelvic floor physical therapy referral Monitoring Plan Short-term:   UA/culture 1 week post-antibiotics  Monitor estradiol response over 4-6 weeks  Monthly prophylaxis effectiveness review Long-term:   Quarterly UA monitoring for first year  Regular CBC and iron studies  Annual urology follow-up  Monitor for bladder sling complications  Additional Care Considerations  Insurance authorization pending for urodynamic testing  Patient has limited bathroom access at  work  Iron deficiency needs concurrent management  Consider vaginal moisturizer in addition to estradiol   09/06/23 1652    methocarbamol (ROBAXIN) 500 MG tablet  Every 6 hours PRN        09/06/23 1652    Cranberry-D Mannose 158-500 MG CAPS  Daily        09/06/23 1652    Ascorbic Acid (VITAMIN C) 500 MG CHEW  Daily        09/06/23 1652    Urinalysis w microscopic + reflex cultur        09/06/23 1652             MEDICAL DECISION MAKING ATTESTATION   MEDICAL DECISION MAKING: HIGH COMPLEXITY  Today's medical decision making demonstrates high complexity based on the following elements:     1. Number and Complexity of Problems Addressed The patient presents with multiple high-complexity problems requiring comprehensive management: Recurrent UTI resistant to multiple antibiotic therapies: Despite appropriate antibiotic selection based on culture sensitivities, patient continues to experience persistent infection with documented positive findings on urinalysis (WBCs 40-60/HPF, moderate bacteria). This represents a chronic condition with severe exacerbation requiring significant management changes. Altered mental status associated with UTI: Neurological symptoms including confusion and speech difficulties requiring extensive differential  diagnosis consideration and recent negative MRI to rule out stroke. Multiple contributing factors requiring simultaneous management: Bladder sling complications, atrophic vaginitis, urinary crystals, and dehydration all interacting and complicating treatment approach.       2. Data Reviewed and Analyzed Extensive data was reviewed and analyzed during this encounter: Comprehensive review of urinalysis and culture results from multiple dates (08/31/2023, 08/17/2023, 08/16/2023) Analysis of CBC and chemistry results showing anemia (Hgb 10.5, Ferritin 14.9) Review of previous imaging studies including recent MRI and lumbar spine radiographs (08/03/2023) Assessment of medication history including multiple failed antibiotic treatments Review of specialty records from urology       3. Risk of Complications and Morbidity/Mortality The risk level associated with this patient's management is high due to: Risk of progression to systemic infection/sepsis: Persistent infection despite appropriate antibiotic therapy increases risk for more serious systemic infection. Complex medication management: Initiating multiple new medications (methenamine, vitamin C, cranberry-D-mannose) while managing drug interactions between ciprofloxacin and tizanidine. Risk associated with untreated contributing factors: Potential need for surgical intervention related to bladder sling. Comorbidity considerations: Management complicated by patient's history of opioid dependence and multiple chronic pain conditions requiring careful medication selection.       4. Care Coordination and Clinical Judgment Additional elements demonstrating complexity: Coordination with urology specialist for comprehensive evaluation of bladder sling Development of multi-faceted approach addressing anatomic, hormonal, metabolic, and behavioral factors Selection of methenamine therapy as alternative to repeated antibiotic cycles to prevent  resistance Integration of preventive strategies alongside acute treatment  SUMMARY: Today's encounter required extensive evaluation and management of multiple serious conditions with significant risk of morbidity. The comprehensive treatment plan addresses both acute management and long-term prevention strategies while coordinating care across specialties.      **This document was synthesized by artificial intelligence ([employer]bridge) using HIPAA-compliant recording of the clinical interaction;   We discussed the use of AI scribe software for clinical note transcription with the patient, who gave verbal consent to proceed.    Additional Info: This encounter employed state-of-the-art, real-time, collaborative documentation. The patient actively reviewed and assisted in updating their electronic medical record on a shared screen, ensuring transparency and facilitating joint problem-solving for the problem list, overview, and plan. This approach promotes accurate, informed care. The  treatment plan was discussed and reviewed in detail, including medication safety, potential side effects, and all patient questions. We confirmed understanding and comfort with the plan. Follow-up instructions were established, including contacting the office for any concerns, returning if symptoms worsen, persist, or new symptoms develop, and precautions for potential emergency department visits.

## 2023-09-07 NOTE — Telephone Encounter (Signed)
 Patient notified of results and recommendations.

## 2023-09-07 NOTE — Assessment & Plan Note (Signed)
 Assessment: Patient with history of recurrent UTIs resistant to multiple antibiotics, including cephalexin and ciprofloxacin. Despite cultures showing bacteria susceptible to antibiotics, infections persist. Contributing factors include a 48 year old bladder sling (potential bacterial colonization site), post-hysterectomy atrophic vaginitis, recurrent urinary crystals, limited fluid intake due to occupational constraints, and recent sexual activity. Current UA shows WBCs 40-60/HPF, moderate bacteria, and few calcium oxalate crystals. The patient's altered mental status may be related to the UTI, as similar symptoms occurred with previous infections, particularly noting a family history of UTI-induced altered mental status. Medication Changes  Start methenamine (Hiprex) 1g twice daily with meals  Add vitamin C 500mg  daily to enhance methenamine effectiveness  Prescribe ciprofloxacin 500mg  twice daily for 10 days  Continue trimethoprim 100mg  daily prophylaxis after ciprofloxacin course  Add cranberry-D-mannose supplement daily   Testing  Order urinalysis with microscopy and reflex culture  Follow-up UA/culture one week after completing antibiotics   Specialist Care  Refer to urology for evaluation of bladder sling and consideration of urodynamic testing  Continue vaginal estradiol 0.1% cream 3x weekly for atrophic vaginitis   Patient Education  Emphasize increased fluid intake, especially during work hours  Discuss urination after sexual activity  Recommend regular bathroom breaks despite work constraints

## 2023-09-08 ENCOUNTER — Other Ambulatory Visit: Payer: Self-pay

## 2023-09-08 ENCOUNTER — Emergency Department (HOSPITAL_BASED_OUTPATIENT_CLINIC_OR_DEPARTMENT_OTHER)

## 2023-09-08 ENCOUNTER — Emergency Department (HOSPITAL_BASED_OUTPATIENT_CLINIC_OR_DEPARTMENT_OTHER)
Admission: EM | Admit: 2023-09-08 | Discharge: 2023-09-08 | Disposition: A | Discharge status: Home / Self Care | Attending: Emergency Medicine | Admitting: Emergency Medicine

## 2023-09-08 ENCOUNTER — Encounter (HOSPITAL_BASED_OUTPATIENT_CLINIC_OR_DEPARTMENT_OTHER): Payer: Self-pay | Admitting: Emergency Medicine

## 2023-09-08 DIAGNOSIS — R519 Headache, unspecified: Secondary | ICD-10-CM | POA: Insufficient documentation

## 2023-09-08 DIAGNOSIS — I10 Essential (primary) hypertension: Secondary | ICD-10-CM | POA: Diagnosis not present

## 2023-09-08 DIAGNOSIS — Z79899 Other long term (current) drug therapy: Secondary | ICD-10-CM | POA: Insufficient documentation

## 2023-09-08 LAB — CBC WITH DIFFERENTIAL/PLATELET
Abs Immature Granulocytes: 0.03 10*3/uL (ref 0.00–0.07)
Basophils Absolute: 0 10*3/uL (ref 0.0–0.1)
Basophils Relative: 1 %
Eosinophils Absolute: 0.2 10*3/uL (ref 0.0–0.5)
Eosinophils Relative: 2 %
HCT: 31 % — ABNORMAL LOW (ref 36.0–46.0)
Hemoglobin: 10.2 g/dL — ABNORMAL LOW (ref 12.0–15.0)
Immature Granulocytes: 0 %
Lymphocytes Relative: 30 %
Lymphs Abs: 2.5 10*3/uL (ref 0.7–4.0)
MCH: 29.2 pg (ref 26.0–34.0)
MCHC: 32.9 g/dL (ref 30.0–36.0)
MCV: 88.8 fL (ref 80.0–100.0)
Monocytes Absolute: 0.6 10*3/uL (ref 0.1–1.0)
Monocytes Relative: 8 %
Neutro Abs: 5 10*3/uL (ref 1.7–7.7)
Neutrophils Relative %: 59 %
Platelets: 289 10*3/uL (ref 150–400)
RBC: 3.49 MIL/uL — ABNORMAL LOW (ref 3.87–5.11)
RDW: 13.2 % (ref 11.5–15.5)
WBC: 8.4 10*3/uL (ref 4.0–10.5)
nRBC: 0 % (ref 0.0–0.2)

## 2023-09-08 LAB — COMPREHENSIVE METABOLIC PANEL WITH GFR
ALT: 8 U/L (ref 0–44)
AST: 12 U/L — ABNORMAL LOW (ref 15–41)
Albumin: 4.1 g/dL (ref 3.5–5.0)
Alkaline Phosphatase: 64 U/L (ref 38–126)
Anion gap: 7 (ref 5–15)
BUN: 15 mg/dL (ref 6–20)
CO2: 26 mmol/L (ref 22–32)
Calcium: 8.9 mg/dL (ref 8.9–10.3)
Chloride: 106 mmol/L (ref 98–111)
Creatinine, Ser: 0.6 mg/dL (ref 0.44–1.00)
GFR, Estimated: >60 mL/min (ref >60)
Glucose, Bld: 86 mg/dL (ref 70–99)
Potassium: 4 mmol/L (ref 3.5–5.1)
Sodium: 139 mmol/L (ref 135–145)
Total Bilirubin: 0.3 mg/dL (ref 0.0–1.2)
Total Protein: 6.6 g/dL (ref 6.5–8.1)

## 2023-09-08 LAB — RESP PANEL BY RT-PCR (RSV, FLU A&B, COVID)  RVPGX2
Influenza A by PCR: NEGATIVE
Influenza B by PCR: NEGATIVE
Resp Syncytial Virus by PCR: NEGATIVE
SARS Coronavirus 2 by RT PCR: NEGATIVE

## 2023-09-08 MED ORDER — IOHEXOL 350 MG/ML SOLN
100.0000 mL | Freq: Once | INTRAVENOUS | Status: AC | PRN
Start: 2023-09-08 — End: 2023-09-08
  Administered 2023-09-08: 75 mL via INTRAVENOUS

## 2023-09-08 MED ORDER — FENTANYL CITRATE PF 50 MCG/ML IJ SOSY
25.0000 ug | PREFILLED_SYRINGE | Freq: Once | INTRAMUSCULAR | Status: AC
  Administered 2023-09-08: 25 ug via INTRAVENOUS
  Filled 2023-09-08: qty 1

## 2023-09-08 MED ORDER — PROCHLORPERAZINE EDISYLATE 10 MG/2ML IJ SOLN
10.0000 mg | Freq: Once | INTRAMUSCULAR | Status: AC
  Administered 2023-09-08: 10 mg via INTRAVENOUS
  Filled 2023-09-08: qty 2

## 2023-09-08 MED ORDER — SODIUM CHLORIDE 0.9 % IV BOLUS
500.0000 mL | Freq: Once | INTRAVENOUS | Status: AC
  Administered 2023-09-08: 500 mL via INTRAVENOUS

## 2023-09-08 MED ORDER — METOCLOPRAMIDE HCL 5 MG/ML IJ SOLN
10.0000 mg | Freq: Once | INTRAMUSCULAR | Status: AC
  Administered 2023-09-08: 10 mg via INTRAVENOUS
  Filled 2023-09-08: qty 2

## 2023-09-08 NOTE — ED Provider Notes (Signed)
 [place] EMERGENCY DEPARTMENT AT [facility] Provider Note   CSN: [id number] Arrival date & time: 09/08/23  1258     History Chief Complaint  Patient presents with   Headache    Laurie Roman is a 48 y.o. female.  Patient with past history significant for migraine headaches, recurrent UTIs, opioid dependence, anxiety, hypertension presents emergency department today with concerns for headache.  Reports that she has been endorsing migraine type headaches but slightly worse since a fall on 07/06/2023.  She reports that the pain in her head is primarily in the posterior head with some intermittent episodes of blurred vision.  She also endorses some pain in her right lower back.  Has been seen by neurology and was advised that this was likely due to poorly controlled migraine headaches and was advised to change her migraine medications at home.  She currently takes Aimovig which has previously been controlling her migraine headaches well.  She endorses with these current headaches experiencing some vision changes with occasional blackening of her vision that resolved on its own.   Headache      Home Medications Prior to Admission medications   Medication Sig Start Date End Date Taking? Authorizing Provider  albuterol (VENTOLIN HFA) 108 (90 Base) MCG/ACT inhaler Inhale 2 puffs into the lungs every 6 (six) hours as needed for wheezing or shortness of breath. 02/11/23   [name], MD  amLODipine (NORVASC) 10 MG tablet Take 1 tablet (10 mg total) by mouth daily. Replaces amlodipine 5, for uncontrolled blood pressure. 05/18/23   Jeani Sow, MD  amphetamine-dextroamphetamine (ADDERALL) 20 MG tablet Take 1 tablet (20 mg total) by mouth 2 (two) times daily. 08/26/23 09/25/23  Jeani Sow, MD  ARIPiprazole (ABILIFY) 2 MG tablet Take 1 tablet (2 mg total) by mouth daily as needed. 05/27/23   [name], MD  Ascorbic Acid (VITAMIN C) 500 MG CHEW Chew 1 tablet (500 mg  total) by mouth daily at 6 (six) AM. Take with methenamine 09/06/23   [name], MD  Buprenorphine HCl-Naloxone HCl (SUBOXONE) 8-2 MG FILM Place 2 Film under the tongue daily at 6 (six) AM. At first, take only 0.25  films (2 mg opioid) each hour until minimum effective dose reached to control cravings, up to 2 films total in day max. 06/25/23   [name], MD  chlorhexidine (PERIDEX) 0.12 % solution Use as directed 15 mLs in the mouth or throat 2 (two) times daily. 07/07/22   [name], MD  ciclopirox (LOPROX) 0.77 % cream Apply topically 2 (two) times daily. 05/10/23   [name], MD  ciprofloxacin (CIPRO) 500 MG tablet Take 1 tablet (500 mg total) by mouth 2 (two) times daily for 10 days. Stop zanaflex tizanidine while taking 09/06/23 09/16/23  [name], MD  Cranberry-D Mannose 158-500 MG CAPS Take 1 Capful by mouth daily at 6 (six) AM. 09/06/23   [name], MD  Cyanocobalamin (B-12) 1000 MCG SUBL Place 1 tablet under the tongue daily at 6 (six) AM. 04/21/23   [name], MD  Erenumab-aooe (AIMOVIG) 140 MG/ML SOAJ Inject 140 mg into the skin every 30 (thirty) days. 07/28/23   Jeani Sow, MD  estradiol (ESTRACE VAGINAL) 0.1 MG/GM vaginal cream Place 1 Applicatorful vaginally 3 (three) times a week. 08/30/23   [name], MD  fluconazole (DIFLUCAN) 150 MG tablet Take 1 tablet (150 mg total) by mouth every three (3) days as needed.  09/01/23   Jeani Sow, MD  fluticasone [facility]) 50 MCG/ACT nasal spray Place 1 spray into both nostrils daily. 05/18/23   Jeani Sow, MD  gabapentin (NEURONTIN) 600 MG tablet Take 1 tablet (600 mg total) by mouth 3 (three) times daily. 08/06/23   Jeani Sow, MD  gabapentin (NEURONTIN) 800 MG tablet Take 1 tablet (800 mg total) by mouth 4 (four) times daily as needed. Replaces prior dosing schedule. 06/16/23   [name], MD  HYDROcodone-acetaminophen [facility]) 10-325 MG tablet Take 1 tablet by mouth every 8  (eight) hours as needed. 09/06/23   Jeani Sow, MD  ketorolac (TORADOL) 10 MG tablet Take 1 tablet (10 mg total) by mouth every 6 (six) hours as needed. 04/02/23   [name], MD  ketorolac (TORADOL) 10 MG tablet Take 1 tablet (10 mg total) by mouth every 6 (six) hours as needed. 08/30/23   [name], MD  lidocaine (HM LIDOCAINE PATCH) 4 % Place 1 patch onto the skin daily. 08/11/23   [name], MD  lidocaine (XYLOCAINE) 5 % ointment Apply 1 Application topically as needed. 06/16/23   [name], MD  Lidocaine 10 % CREA Apply 1 Application topically daily at 6 (six) AM. 08/11/23   [name], MD  linaclotide [facility]) 145 MCG CAPS capsule Take 1 capsule (145 mcg total) by mouth daily before breakfast. 04/19/23   [name], MD  LORazepam (ATIVAN) 1 MG tablet MUST HAVE DRIVER. Arrive 1 hour prior to your MRI appt. Take 1 tablet once your driver has been confirmed. If needed, take second tablet 30 minutes prior to MRI. 08/25/23     losartan (COZAAR) 100 MG tablet Take 1 tablet (100 mg total) by mouth daily. 08/23/23   Jeani Sow, MD  methenamine (HIPREX) 1 g tablet Take 1 tablet (1 g total) by mouth 2 (two) times daily with a meal. 09/06/23   [name], MD  methocarbamol (ROBAXIN) 500 MG tablet Take 2 tablets (1,000 mg total) by mouth every 6 (six) hours as needed for muscle spasms. 09/06/23   [name], MD  methylPREDNISolone (MEDROL DOSEPAK) 4 MG TBPK tablet See admin instructions. 08/02/23   [provider]  metoprolol tartrate (LOPRESSOR) 25 MG tablet Take 1 tablet (25 mg total) by mouth 2 (two) times daily. 01/29/23   Jeani Sow, MD  mometasone-formoterol [facility]) 100-5 MCG/ACT AERO Inhale 2 puffs into the lungs 2 (two) times daily. 05/18/23   Jeani Sow, MD  NONFORMULARY OR COMPOUNDED ITEM Recommended starting JOURNAVX oral dose is 100 mg. Take the starting  dose on an empty stomach at least 1 hour before or 2 hours after food.   Clear liquids may be consumed during this time (e.g., water, apple juice,  vegetable broth, tea, black coffee). (2.1)   Starting 12 hours after the starting dose, take 50 mg of JOURNAVX orally  every 12 hours. Take these doses with or without food. (2.1)  Use JOURNAVX for the shortest duration, consistent with individual  patient treatment goals. Use of JOURNAVX for the treatment of acute pain  has not been studied beyond 14 days.  28 DAY SUPPLY 08/11/23   [name], MD  nystatin (MYCOSTATIN) 100000 UNIT/ML suspension Take 5 mLs (500,000 Units total) by mouth 4 (four) times daily. Swish and hold in mouth few seconds and swallow 10/01/22   Jeani Sow, MD  nystatin (MYCOSTATIN/NYSTOP) powder Apply 1 Application topically 3 (  three) times daily. 01/01/23   [name], MD  nystatin cream (MYCOSTATIN) Apply 1 Application topically 2 (two) times daily. 01/01/23   [name], MD  ondansetron (ZOFRAN) 4 MG tablet Take 1 tablet (4 mg total) by mouth every 8 (eight) hours as needed for nausea or vomiting. 07/07/23   Jeani Sow, MD  pantoprazole (PROTONIX) 40 MG tablet Take 1 tablet (40 mg total) by mouth 2 (two) times daily before a meal. 05/18/23   Jeani Sow, MD  phenazopyridine (PYRIDIUM) 200 MG tablet Take 1 tablet (200 mg total) by mouth 3 (three) times daily. 08/16/23   [name], MD  potassium chloride (KLOR-CON M) 20 MEQ tablet Take 1 tablet (20 mEq total) by mouth daily. 01/28/23   Jeani Sow, MD  Probiotic Product (PROBIOTIC DAILY) CAPS Take 1 capsule by mouth daily. 04/19/23   [name], MD  promethazine (PHENERGAN) 25 MG tablet Take 25 mg by mouth every 6 (six) hours as needed. 08/02/23   [provider]  Rimegepant Sulfate (NURTEC) 75 MG TBDP Take 1 tablet (75 mg total) by mouth once a week. 08/11/23   [name], MD  rizatriptan (MAXALT) 10 MG tablet Take 1 tablet (10 mg total) by mouth as needed for migraine. May repeat in 2 hours  if needed 05/18/23   Jeani Sow, MD  scopolamine (TRANSDERM-SCOP) 1 MG/3DAYS Place 1 patch (1.5 mg total) onto the skin every 3 (three) days. 08/30/23   [name], MD  trimethoprim (TRIMPEX) 100 MG tablet Take 1 tablet (100 mg total) by mouth daily. 08/23/23   Jeani Sow, MD      Allergies    Cefazolin, Ondansetron, Other, Sulfa antibiotics, Tramadol hcl, Amitriptyline, Galcanezumab-gnlm, Ultram [tramadol], Nitrofurantoin, Oxycodone, and Sulfasalazine    Review of Systems   Review of Systems  Neurological:  Positive for headaches.  All other systems reviewed and are negative.   Physical Exam Updated Vital Signs BP 111/63   Pulse 86   Temp 98.3 F (36.8 C) (Oral)   Resp 16   Wt 63 kg   SpO2 98%   BMI 28.07 kg/m  Physical Exam Vitals and nursing note reviewed.  Constitutional:      General: She is not in acute distress.    Appearance: She is well-developed.  HENT:     Head: Normocephalic and atraumatic.  Eyes:     Conjunctiva/sclera: Conjunctivae normal.  Cardiovascular:     Rate and Rhythm: Normal rate and regular rhythm.     Heart sounds: No murmur heard. Pulmonary:     Effort: Pulmonary effort is normal. No respiratory distress.     Breath sounds: Normal breath sounds.  Abdominal:     Palpations: Abdomen is soft.     Tenderness: There is no abdominal tenderness.  Musculoskeletal:        General: No swelling.     Cervical back: Neck supple.  Skin:    General: Skin is warm and dry.     Capillary Refill: Capillary refill takes less than 2 seconds.  Neurological:     Mental Status: She is alert.     Cranial Nerves: No cranial nerve deficit, dysarthria or facial asymmetry.  Psychiatric:        Mood and Affect: Mood normal.     ED Results / Procedures / Treatments   Labs (all labs ordered are listed, but only abnormal results are displayed) Labs Reviewed  CBC WITH DIFFERENTIAL/PLATELET - Abnormal;  Notable for the following components:       Result Value   RBC 3.49 (*)    Hemoglobin 10.2 (*)    HCT 31.0 (*)    All other components within normal limits  COMPREHENSIVE METABOLIC PANEL WITH GFR - Abnormal; Notable for the following components:   AST 12 (*)    All other components within normal limits  RESP PANEL BY RT-PCR (RSV, FLU A&B, COVID)  RVPGX2  URINALYSIS, ROUTINE W REFLEX MICROSCOPIC  PREGNANCY, URINE    EKG None  Radiology No results found.  Procedures Procedures    Medications Ordered in ED Medications  metoCLOPramide (REGLAN) injection 10 mg (10 mg Intravenous Given 09/08/23 1544)  prochlorperazine (COMPAZINE) injection 10 mg (10 mg Intravenous Given 09/08/23 1637)  fentaNYL (SUBLIMAZE) injection 25 mcg (25 mcg Intravenous Given 09/08/23 1637)  iohexol (OMNIPAQUE) 350 MG/ML injection 100 mL (75 mLs Intravenous Contrast Given 09/08/23 1654)  sodium chloride 0.9 % bolus 500 mL (0 mLs Intravenous Stopped 09/08/23 1821)    ED Course/ Medical Decision Making/ A&P                                 Medical Decision Making Amount and/or Complexity of Data Reviewed Labs: ordered. Radiology: ordered.  Risk Prescription drug management.   This patient presents to the ED for concern of headache.  Differential diagnosis includes migraine, TIA, stroke, postconcussive syndrome   Lab Tests:  I Ordered, and personally interpreted labs.  The pertinent results include: CBC unremarkable   Imaging Studies ordered:  I ordered imaging studies including CT angio head and neck I independently visualized and interpreted imaging which showed pending at time of signout I agree with the radiologist interpretation MRI Brain w/wo impressions from 09/03/2023 from [facility] reads: No definite acute intracranial abnormality. No evidence of acute stroke. Evaluation for acute infarct in the right cerebellum is limited by artifact. A couple of scattered deep white matter FLAIR hyperintense foci which are  nonspecific.   Medicines ordered and prescription drug management:  I ordered medication including Reglan, Compazine, fentanyl, fluids for nausea, headache, pain, dehydration Reevaluation of the patient after these medicines showed that the patient improved I have reviewed the patients home medicines and have made adjustments as needed   Problem List / ED Course:  Patient past history significant for migraine headaches, hypertension, opioid dependence, recurrent UTIs, anxiety, hypertension presents the ED today with concerns of headaches.  Reports that she had mechanical fall in late January while at work and struck her head.  Since then, has had intermittent episodes of headaches with some vision disturbance which have been worsening in severity.  She states that while she is driving into the emergency department today, she had a brief episode of some vision loss and darkening.  She is is resolved on its own.  Has been followed by neurology recently and they advised that this is likely due to poorly controlled migraine headaches.  Patient states that she has been relatively migraine free since she had started on Aimovig some years ago and does not feel that these are migraines. Physical exam is unremarkable.  No acute neurological deficits.  Will obtain visual acuity screening. No retinal abnormalities.  Will proceed with labs and imaging.  This patient has had a prior normal MRI a few weeks ago, will obtain a CT angio head for further assessment. On reassessment, patient does report some improvement of the  headache after medications.  CT angio head and neck still pending at this time.  7:13 PM Care of Aimie Pruiett transferred to [facility] and Dr. Renaye Rakers at the end of my shift as the patient will require reassessment once labs/imaging have resulted. Patient presentation, ED course, and plan of care discussed with review of all pertinent labs and imaging. Please see his/her note for further details  regarding further ED course and disposition. Plan at time of handoff is await CT imaging for final dispo. Can likely follow up with migraine clinic outpatient. This may be altered or completely changed at the discretion of the oncoming team pending results of further workup.   Final Clinical Impression(s) / ED Diagnoses Final diagnoses:  None    Rx / DC Orders ED Discharge Orders     None         Smitty Knudsen, PA-C 09/08/23 1913    Rexford Maus, DO 09/09/23 [phone]

## 2023-09-08 NOTE — ED Triage Notes (Signed)
 Pt endorses fall 1/28 as wc. Seen Friday for slurred speech. Pt now endorses posterior HA with blurred vision and RT lower back pain (reports step off)since fall, was seen by neurology and referred to ED on Friday. HA continues with no relief.

## 2023-09-08 NOTE — Telephone Encounter (Signed)
 Pharmacy Patient Advocate Encounter  Received notification from [facility] that Prior Authorization for Estradiol 0.1MG /GM cream has been DENIED DUE TO You have failed at least two preferred drugs as confirmed by claims history or submission of medical records. The preferred drugs: Estring Vaginal Ring, Premarin Vaginal Cream, brand Vagifem Vaginal Tablet. Per your health plan's criteria, this drug is covered if you meet the following: One of the following:(2) You cannot use two preferred drugs (please specify contraindication or intolerance). The information provided does not show that you meet the criteria listed above. Please speak with your doctor about your choices. This decision was made per the [facility] NonPreferred Drugs  [place] #/Case ID/Reference #: [id number]

## 2023-09-08 NOTE — ED Provider Notes (Signed)
 Patient given in sign out by [facility], PA-C.  Please review their note for patient HPI, physical exam, workup.  At this time the plan is follow-up on imaging and plan on discharge.  CTA was normal.  Will plan on discharge with outpatient follow-up as discussed by previous provider.  Patient stable to be discharged.  Patient given return precautions.  Patient verbalized understanding and acceptance of this plan.   Remi Deter 09/08/23 2016    Terald Sleeper, MD 09/08/23 2046

## 2023-09-08 NOTE — Discharge Instructions (Signed)
 We saw you in the ER for headaches. All the labs and imaging are normal. We are not sure what is causing your headaches, however, there appears to be no evidence of infection, bleeds or tumors based on our exam and results.  Please take motrin/tylenol round the clock for the next 6 hours, remain hydrated, and take other meds prescribed only for break through pain. See your doctor if the pain persists, as you might need better medications or a specialist.

## 2023-09-10 ENCOUNTER — Other Ambulatory Visit: Payer: Self-pay | Admitting: Family Medicine

## 2023-09-10 ENCOUNTER — Ambulatory Visit: Admitting: Family Medicine

## 2023-09-10 ENCOUNTER — Encounter: Payer: Self-pay | Admitting: Family Medicine

## 2023-09-10 VITALS — BP 159/70 | HR 99 | Temp 97.9°F | Ht 59.0 in | Wt 137.6 lb

## 2023-09-10 DIAGNOSIS — R110 Nausea: Secondary | ICD-10-CM

## 2023-09-10 DIAGNOSIS — H538 Other visual disturbances: Secondary | ICD-10-CM | POA: Diagnosis not present

## 2023-09-10 DIAGNOSIS — G44309 Post-traumatic headache, unspecified, not intractable: Secondary | ICD-10-CM | POA: Diagnosis not present

## 2023-09-10 DIAGNOSIS — N390 Urinary tract infection, site not specified: Secondary | ICD-10-CM

## 2023-09-10 DIAGNOSIS — Z79899 Other long term (current) drug therapy: Secondary | ICD-10-CM

## 2023-09-10 DIAGNOSIS — I10 Essential (primary) hypertension: Secondary | ICD-10-CM | POA: Diagnosis not present

## 2023-09-10 DIAGNOSIS — G43909 Migraine, unspecified, not intractable, without status migrainosus: Secondary | ICD-10-CM

## 2023-09-10 DIAGNOSIS — M542 Cervicalgia: Secondary | ICD-10-CM

## 2023-09-10 MED ORDER — KETOROLAC TROMETHAMINE 60 MG/2ML IM SOLN
60.0000 mg | Freq: Once | INTRAMUSCULAR | Status: AC
  Administered 2023-09-10: 60 mg via INTRAMUSCULAR

## 2023-09-10 MED ORDER — ESTRADIOL 10 MCG VA TABS: ORAL_TABLET | VAGINAL | 4 refills | Status: AC

## 2023-09-10 NOTE — Patient Instructions (Signed)
 Please follow up if symptoms do not improve or as needed.    Post-Concussion Syndrome  A concussion is a brain injury from a direct hit to the head or body. This hit causes the brain to shake back and forth fast inside the skull. The shaking can damage brain cells and cause chemical changes in the brain. Concussions are normally not life-threatening but can cause serious symptoms. Post-concussion syndrome is when symptoms that happen after a concussion last longer than normal. These symptoms can last from weeks to months. What are the causes? The cause of this condition is not known. It can happen whether your head injury was mild or severe. What increases the risk? You are more likely to get this condition if: You are female. You are a child, teen, or young adult. You have had a head injury before. You have a history of headaches. You have depression or anxiety. You have more than one symptom or severe symptoms when your concussion occurs. You faint or cannot remember the event (have amnesia of the event). What are the signs or symptoms? Symptoms of this condition include: Physical symptoms, such as: Headaches. Tiredness. Dizziness and weakness. Blurred vision and sensitivity to light. Trouble hearing. Problems with balance. Mental and emotional symptoms, such as: Memory problems and trouble focusing. Trouble falling asleep or staying asleep (insomnia). Feeling irritable. Anxiety or depression. Trouble learning new things. How is this diagnosed? This condition may be diagnosed based on: Your symptoms. A description of your injury. Your medical history. Testing your strength, balance, and nerve function (neurological exam). Your health care provider may order other tests. These may include: Brain imaging, such as a CT scan or an MRI. Memory testing (neuropsychological testing). How is this treated? Treatment for this condition may depend on your symptoms. Symptoms normally  go away on their own with time. If you need treatment, it may include: Medicines for headaches, anxiety, depression, and insomnia. Resting your brain and body for a few days after your injury. Rehab therapy, such as: Physical or occupational therapy. This may include exercises to help with balance and dizziness. Mental health counseling. A form of talk therapy called cognitive behavioral therapy (CBT) can be especially helpful. This therapy helps you set goals and follow up on the changes that you make. Speech therapy. Vision therapy. A brain and eye specialist can recommend treatments for vision problems. Follow these instructions at home: Medicines Take over-the-counter and prescription medicines only as told by your health care provider. Avoid opioid prescription pain medicines when getting over a concussion. Activity Limit your mental activities for the first few days after your injury. This may include not doing these things: Homework or work for your job. Complex thinking. Watching TV. Using a computer or phone. Playing memory games and puzzles. Slowly return to your normal activity level. If a certain activity brings on your symptoms, stop or slow down until you can do the activity without getting symptoms again. Limit physical activity, such as sports or strenuous activities, for the first few days after a concussion. Slowly return to normal activity as told by your health care provider. Light exercise may be helpful. Rest helps your brain heal. Make sure you: Get plenty of sleep at night. Most adults should get at least 7-9 hours of sleep each night. Rest during the day. Take naps or rest breaks when you feel tired. Do not do high-risk activities that could cause a second concussion, such as riding a bike or playing sports. Having another  concussion before the first one has healed can be harmful. General instructions  Do not drink alcohol until your health care provider says that  you can. Keep track of how severe your symptoms are and how often they happen. Give this information to your health care provider. Keep all follow-up visits. Your health care provider may need to check you for new or serious symptoms. Where to find more information [employer]: concussionfoundation.org Contact a health care provider if: Your symptoms do not improve. You get injured again. You have unusual behavior changes. Get help right away if: You have a severe or worsening headache. You have weakness or numbness in any part of your body. You vomit repeatedly. You have mental status changes, such as: Confusion. Trouble speaking. Trouble staying awake. Fainting. You have a seizure. These symptoms may be an emergency. Get help right away. Call 911. Do not wait to see if the symptoms will go away. Do not drive yourself to the hospital. Also, get help right away if: You think about hurting yourself or others. Take one of these steps if you feel like you may hurt yourself or others, or have thoughts about taking your own life: Go to your nearest emergency room. Call 911. Call the National Suicide Prevention Lifeline at (807)526-5841 or 988. This is open 24 hours a day. Text the Crisis Text Line at 765 059 8705. This information is not intended to replace advice given to you by your health care provider. Make sure you discuss any questions you have with your health care provider. Document Revised: 10/17/2021 Document Reviewed: 10/17/2021 Elsevier Patient Education  2024 [employer].

## 2023-09-10 NOTE — Telephone Encounter (Signed)
Noted.  Patient notified.

## 2023-09-10 NOTE — Progress Notes (Signed)
 Subjective  CC:  Chief Complaint  Patient presents with   Headache   Blurred Vision    Pt thinks this is workers comp case. She had vomited in her way here this morning.     Same day acute visit; PCP not available. New pt to me. Chart reviewed.  HPI: Laurie Roman is a 48 y.o. female who presents to the office today to address the problems listed above in the chief complaint. 48 year old female presents due to blurred vision and headache.  The symptoms are chronic in nature, they started after a fall that she had on the job in January.  She is currently undergoing medical care through workers comp sensation case.  Because of her job, blurred vision makes it impossible to fill her work requirements today.  She is being seen today to make sure that she is safe.  She reports she has an occipital headache, chronic neck pain, blurred vision that is intermittent in the last hours.  Symptoms tend to worsen after prolonged work or physical therapy sessions.  Rest tends to make things better.  She did have a recent ER visit for same symptoms.  MRI was done at that time showing no acute abnormalities.  She has been treated with Zofran and Toradol for her headaches.  She is on multiple medications for multiple medical problems.  I reviewed her medication list.  I reviewed her emergency room visit from April 2.  At that time a CTA angio head and neck was normal.  She had a prior brain MRI at [facility] that did not show any acute abnormalities.  She has seen neurology.  She reports that she was told this was an exacerbation of her migraine syndrome however she feels strongly that these headaches are different.  No other neurologic deficits are present at this time.  She has no chest pain shortness of breath or palpitations.  Her blood pressure is elevated but she does not feel well and she is stressed.  Assessment  1. Post-concussion headache   2. Blurred vision, bilateral   3. Nausea   4. Cervicalgia   5.  Polypharmacy   6. Essential hypertension   7. Migraine without status migrainosus, not intractable, unspecified migraine type      Plan  Headaches: Likely multifactorial: No new symptoms to indicate new neurologic etiologies.  She has had 2 brain imaging studies which did not show acute problems.  Likely postconcussive syndrome complicated by migraine syndrome and cervicalgia.  Possibly tension headaches could be contributing as well.  Treat with Toradol and Zofran today.  She will follow-up with Workmen's Comp. to further delineate work restrictions.  No red flag symptoms are noted today. Hypertension: Once her pain is resolved, can recheck blood pressure.  By chart review, blood pressure has been well-controlled. Polypharmacy: Encourage patient to consider adjusting down medications and taking medications that are not necessary.  She will discuss with PCP.  Follow up: With Workmen's Comp. and PCP. Visit date not found  No orders of the defined types were placed in this encounter.  Meds ordered this encounter  Medications   ketorolac (TORADOL) injection 60 mg      I reviewed the patients updated PMH, FH, and SocHx.    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   Flank pain 10/27/2022   Urinary incontinence 10/26/2022   B12 deficiency 10/05/2022   Opioid dependence (HCC) 08/21/2022   Chronic dental pain 06/29/2022   Cervicalgia 03/27/2022   Depression, major, single episode, severe (HCC) 05/16/2020   Primary insomnia 09/15/2019   Anxiety    Maxillary sinusitis 12/02/2018   Asthma 03/27/2018   Controlled substance agreement signed  10/13/2017   Migraine 08/21/2017   ADHD 08/21/2017   Essential hypertension 08/21/2017   Mild obstructive sleep apnea 02/18/2017   Left sided sciatica 09/27/2015   Gastroesophageal reflux disease without esophagitis 05/02/2014   Current Meds  Medication Sig   albuterol (VENTOLIN HFA) 108 (90 Base) MCG/ACT inhaler Inhale 2 puffs into the lungs every 6 (six) hours as needed for wheezing or shortness of breath.   amLODipine (NORVASC) 10 MG tablet Take 1 tablet (10 mg total) by mouth daily. Replaces amlodipine 5, for uncontrolled blood pressure.   amphetamine-dextroamphetamine (ADDERALL) 20 MG tablet Take 1 tablet (20 mg total) by mouth 2 (two) times daily.   ARIPiprazole (ABILIFY) 2 MG tablet Take 1 tablet (2 mg total) by mouth daily as needed.   Ascorbic Acid (VITAMIN C) 500 MG CHEW Chew 1 tablet (500 mg total) by mouth daily at 6 (six) AM. Take with methenamine   Buprenorphine HCl-Naloxone HCl (SUBOXONE) 8-2 MG FILM Place 2 Film under the tongue daily at 6 (six) AM. At first, take only 0.25  films (2 mg opioid) each hour until minimum effective dose reached to control cravings, up to 2 films total in day max.   chlorhexidine (PERIDEX) 0.12 % solution Use as directed 15 mLs in the mouth or throat 2 (two) times daily.   ciclopirox (LOPROX) 0.77 % cream Apply topically 2 (two) times daily.   ciprofloxacin (CIPRO) 500 MG tablet Take 1 tablet (500 mg total) by mouth 2 (two) times daily for 10 days. Stop zanaflex tizanidine while taking   Cranberry-D Mannose 158-500 MG CAPS Take 1 Capful by mouth daily at 6 (six) AM.   Cyanocobalamin (B-12) 1000 MCG SUBL Place 1 tablet under the tongue daily at 6 (six) AM.   Erenumab-aooe (AIMOVIG) 140 MG/ML SOAJ Inject 140 mg into the skin every 30 (thirty) days.   Estradiol 10 MCG TABS vaginal tablet Place 1 tablet (10 mcg total) vaginally at bedtime for 14 days, THEN 1 tablet (10 mcg total) 2 (two) times a week. Insert one tablet in the vagina twice a week..    fluconazole (DIFLUCAN) 150 MG tablet Take 1 tablet (150 mg total) by mouth every three (3) days as needed.   fluticasone (FLONASE) 50 MCG/ACT nasal spray Place 1 spray into both nostrils daily.   gabapentin (NEURONTIN) 600 MG tablet Take 1 tablet (600 mg total) by mouth 3 (three) times daily.   gabapentin (NEURONTIN) 800 MG tablet Take 1 tablet (800 mg total) by mouth 4 (four) times daily as needed. Replaces prior dosing schedule.   HYDROcodone-acetaminophen (NORCO) 10-325 MG tablet Take 1 tablet by mouth every 8 (eight) hours as needed.   ketorolac (TORADOL) 10 MG tablet Take 1 tablet (10 mg total) by mouth every 6 (six) hours as needed.   ketorolac (TORADOL) 10 MG tablet Take 1 tablet (10 mg total) by mouth every 6 (six) hours as needed.   lidocaine (HM LIDOCAINE PATCH) 4 % Place 1 patch onto the skin daily.   lidocaine (  XYLOCAINE) 5 % ointment Apply 1 Application topically as needed.   Lidocaine 10 % CREA Apply 1 Application topically daily at 6 (six) AM.   linaclotide (LINZESS) 145 MCG CAPS capsule Take 1 capsule (145 mcg total) by mouth daily before breakfast.   LORazepam (ATIVAN) 1 MG tablet MUST HAVE DRIVER. Arrive 1 hour prior to your MRI appt. Take 1 tablet once your driver has been confirmed. If needed, take second tablet 30 minutes prior to MRI.   losartan (COZAAR) 100 MG tablet Take 1 tablet (100 mg total) by mouth daily.   methenamine (HIPREX) 1 g tablet Take 1 tablet (1 g total) by mouth 2 (two) times daily with a meal.   methocarbamol (ROBAXIN) 500 MG tablet Take 2 tablets (1,000 mg total) by mouth every 6 (six) hours as needed for muscle spasms.   methylPREDNISolone (MEDROL DOSEPAK) 4 MG TBPK tablet See admin instructions.   metoprolol tartrate (LOPRESSOR) 25 MG tablet Take 1 tablet (25 mg total) by mouth 2 (two) times daily.   mometasone-formoterol (DULERA) 100-5 MCG/ACT AERO Inhale 2 puffs into the lungs 2 (two) times daily.   NONFORMULARY OR COMPOUNDED ITEM Recommended starting  JOURNAVX oral dose is 100 mg. Take the starting  dose on an empty stomach at least 1 hour before or 2 hours after food.  Clear liquids may be consumed during this time (e.g., water, apple juice,  vegetable broth, tea, black coffee). (2.1)   Starting 12 hours after the starting dose, take 50 mg of JOURNAVX orally  every 12 hours. Take these doses with or without food. (2.1)  Use JOURNAVX for the shortest duration, consistent with individual  patient treatment goals. Use of JOURNAVX for the treatment of acute pain  has not been studied beyond 14 days.  28 DAY SUPPLY   nystatin (MYCOSTATIN) 100000 UNIT/ML suspension Take 5 mLs (500,000 Units total) by mouth 4 (four) times daily. Swish and hold in mouth few seconds and swallow   nystatin (MYCOSTATIN/NYSTOP) powder Apply 1 Application topically 3 (three) times daily.   nystatin cream (MYCOSTATIN) Apply 1 Application topically 2 (two) times daily.   ondansetron (ZOFRAN) 4 MG tablet Take 1 tablet (4 mg total) by mouth every 8 (eight) hours as needed for nausea or vomiting.   pantoprazole (PROTONIX) 40 MG tablet Take 1 tablet (40 mg total) by mouth 2 (two) times daily before a meal.   phenazopyridine (PYRIDIUM) 200 MG tablet Take 1 tablet (200 mg total) by mouth 3 (three) times daily.   potassium chloride (KLOR-CON M) 20 MEQ tablet Take 1 tablet (20 mEq total) by mouth daily.   Probiotic Product (PROBIOTIC DAILY) CAPS Take 1 capsule by mouth daily.   promethazine (PHENERGAN) 25 MG tablet Take 25 mg by mouth every 6 (six) hours as needed.   Rimegepant Sulfate (NURTEC) 75 MG TBDP Take 1 tablet (75 mg total) by mouth once a week.   rizatriptan (MAXALT) 10 MG tablet Take 1 tablet (10 mg total) by mouth as needed for migraine. May repeat in 2 hours if needed   scopolamine (TRANSDERM-SCOP) 1 MG/3DAYS Place 1 patch (1.5 mg total) onto the skin every 3 (three) days.   trimethoprim (TRIMPEX) 100 MG tablet Take 1 tablet (100 mg total) by mouth daily.    Current Facility-Administered Medications for the 09/10/23 encounter (Office Visit) with Willow Ora, MD  Medication   ketorolac (TORADOL) injection 60 mg   ondansetron (ZOFRAN-ODT) disintegrating tablet 4 mg    Allergies: Patient is allergic to cefazolin,  ondansetron, other, sulfa antibiotics, tramadol hcl, amitriptyline, galcanezumab-gnlm, ultram [tramadol], nitrofurantoin, oxycodone, and sulfasalazine. Family History: Patient family history includes ADD / ADHD in her father; Breast cancer in her maternal aunt; Clotting disorder in her maternal grandfather; Hyperlipidemia in her mother; Hypertension in her father, maternal grandmother, and mother; Leukemia in her sister; Migraines in her daughter. Social History:  Patient  reports that she has never smoked. She has never used smokeless tobacco. She reports that she does not currently use alcohol. She reports that she does not use drugs.  Review of Systems: Constitutional: Negative for fever malaise or anorexia Cardiovascular: negative for chest pain Respiratory: negative for SOB or persistent cough Gastrointestinal: negative for abdominal pain  Objective  Vitals: BP (!) 159/70 (Patient Position: Sitting, Cuff Size: Normal)   Pulse 99   Temp 97.9 F (36.6 C) (Temporal)   Ht 4\' 11"  (1.499 m)   Wt 137 lb 9.6 oz (62.4 kg)   SpO2 99%   BMI 27.79 kg/m  General: no acute distress , A&Ox3, normal speech Antalgic gait HEENT: PEERLA, EOMI, conjunctiva normal, neck is supple Cardiovascular:  RRR without murmur or gallop.  Respiratory:  Good breath sounds bilaterally, CTAB with normal respiratory effort Neuro: cranial nerves intact  Commons side effects, risks, benefits, and alternatives for medications and treatment plan prescribed today were discussed, and the patient expressed understanding of the given instructions. Patient is instructed to call or message via MyChart if he/she has any questions or concerns regarding our treatment  plan. No barriers to understanding were identified. We discussed Red Flag symptoms and signs in detail. Patient expressed understanding regarding what to do in case of urgent or emergency type symptoms.  Medication list was reconciled, printed and provided to the patient in AVS. Patient instructions and summary information was reviewed with the patient as documented in the AVS. This note was prepared with assistance of Dragon voice recognition software. Occasional wrong-word or sound-a-like substitutions may have occurred due to the inherent limitations of voice recognition software

## 2023-09-14 ENCOUNTER — Telehealth: Payer: Self-pay

## 2023-09-14 ENCOUNTER — Other Ambulatory Visit (HOSPITAL_COMMUNITY): Payer: Self-pay

## 2023-09-14 NOTE — Telephone Encounter (Signed)
 Pharmacy Patient Advocate Encounter   Received notification from Onbase that prior authorization for ESTRADIOL VAGINAL TABS is required/requested.   Insurance verification completed.   The patient is insured through [facility] .   Per test claim:  VAGIFEM is preferred by the insurance.  If suggested medication is appropriate, Please send in a new RX and discontinue this one. If not, please advise as to why it's not appropriate so that we may request a Prior Authorization. Please note, some preferred medications may still require a PA.  If the suggested medications have not been trialed and there are no contraindications to their use, the PA will not be submitted, as it will not be approved.   Called pharmacy to change to brand name Vagifem with DAW 9. Pharmacy received paid claim, will have to order medication.

## 2023-09-15 ENCOUNTER — Other Ambulatory Visit (HOSPITAL_COMMUNITY): Payer: Self-pay

## 2023-09-16 ENCOUNTER — Other Ambulatory Visit (HOSPITAL_BASED_OUTPATIENT_CLINIC_OR_DEPARTMENT_OTHER): Payer: Self-pay

## 2023-09-16 ENCOUNTER — Other Ambulatory Visit: Payer: Self-pay

## 2023-09-16 ENCOUNTER — Other Ambulatory Visit (HOSPITAL_COMMUNITY): Payer: Self-pay

## 2023-09-17 ENCOUNTER — Encounter: Payer: Self-pay | Admitting: Family Medicine

## 2023-09-17 ENCOUNTER — Other Ambulatory Visit: Payer: Self-pay | Admitting: Family Medicine

## 2023-09-17 ENCOUNTER — Telehealth: Payer: Self-pay

## 2023-09-17 ENCOUNTER — Other Ambulatory Visit (HOSPITAL_COMMUNITY): Payer: Self-pay

## 2023-09-17 DIAGNOSIS — G8929 Other chronic pain: Secondary | ICD-10-CM

## 2023-09-17 NOTE — Telephone Encounter (Signed)
 Pharmacy Patient Advocate Encounter   Received notification from CoverMyMeds that prior authorization for Estradiol 0.1MG /GM cream is required/requested.   Insurance verification completed.   The patient is insured through [facility] .   Per test claim: Prescription has been changed to vaginal tablets. Please see telephone encounter on 09/14/23.   Archived key: [id number]

## 2023-09-19 DIAGNOSIS — M47816 Spondylosis without myelopathy or radiculopathy, lumbar region: Secondary | ICD-10-CM | POA: Diagnosis not present

## 2023-09-19 DIAGNOSIS — M549 Dorsalgia, unspecified: Secondary | ICD-10-CM | POA: Diagnosis not present

## 2023-09-19 DIAGNOSIS — Z79899 Other long term (current) drug therapy: Secondary | ICD-10-CM | POA: Diagnosis not present

## 2023-09-19 DIAGNOSIS — M129 Arthropathy, unspecified: Secondary | ICD-10-CM | POA: Diagnosis not present

## 2023-09-19 DIAGNOSIS — M5432 Sciatica, left side: Secondary | ICD-10-CM | POA: Diagnosis not present

## 2023-09-20 ENCOUNTER — Ambulatory Visit: Admitting: Family Medicine

## 2023-09-24 ENCOUNTER — Other Ambulatory Visit (HOSPITAL_COMMUNITY): Payer: Self-pay

## 2023-09-27 DIAGNOSIS — Z79899 Other long term (current) drug therapy: Secondary | ICD-10-CM | POA: Diagnosis not present

## 2023-09-28 ENCOUNTER — Encounter: Payer: Self-pay | Admitting: Family Medicine

## 2023-09-28 ENCOUNTER — Ambulatory Visit (INDEPENDENT_AMBULATORY_CARE_PROVIDER_SITE_OTHER): Admitting: Family Medicine

## 2023-09-28 VITALS — BP 132/86 | HR 86 | Temp 97.7°F | Ht 59.0 in | Wt 138.1 lb

## 2023-09-28 DIAGNOSIS — D509 Iron deficiency anemia, unspecified: Secondary | ICD-10-CM

## 2023-09-28 DIAGNOSIS — G8929 Other chronic pain: Secondary | ICD-10-CM

## 2023-09-28 DIAGNOSIS — M5441 Lumbago with sciatica, right side: Secondary | ICD-10-CM | POA: Diagnosis not present

## 2023-09-28 DIAGNOSIS — G44309 Post-traumatic headache, unspecified, not intractable: Secondary | ICD-10-CM | POA: Diagnosis not present

## 2023-09-28 DIAGNOSIS — I10 Essential (primary) hypertension: Secondary | ICD-10-CM

## 2023-09-28 DIAGNOSIS — E538 Deficiency of other specified B group vitamins: Secondary | ICD-10-CM

## 2023-09-28 DIAGNOSIS — M5431 Sciatica, right side: Secondary | ICD-10-CM

## 2023-09-28 DIAGNOSIS — M5432 Sciatica, left side: Secondary | ICD-10-CM | POA: Diagnosis not present

## 2023-09-28 MED ORDER — KETOROLAC TROMETHAMINE 60 MG/2ML IM SOLN
60.0000 mg | Freq: Once | INTRAMUSCULAR | Status: AC
  Administered 2023-09-28: 60 mg via INTRAMUSCULAR

## 2023-09-28 MED ORDER — POLYSACCHARIDE IRON COMPLEX 150 MG PO CAPS: 150.0000 mg | ORAL_CAPSULE | Freq: Every day | ORAL | 1 refills | Status: DC

## 2023-09-28 MED ORDER — CYANOCOBALAMIN 1000 MCG/ML IJ SOLN
1000.0000 ug | Freq: Once | INTRAMUSCULAR | Status: AC
  Administered 2023-09-28: 1000 ug via INTRAMUSCULAR

## 2023-09-28 MED ORDER — CHLORTHALIDONE 25 MG PO TABS
25.0000 mg | ORAL_TABLET | Freq: Every day | ORAL | 1 refills | Status: DC
Start: 2023-09-28 — End: 2023-10-27

## 2023-09-28 NOTE — Patient Instructions (Addendum)
 Chlorthalidone -1/2 tab/day  [phone] GI.

## 2023-09-28 NOTE — Progress Notes (Signed)
 Subjective:     Patient ID: Laurie Roman, female    DOB: 08/12/1975, 48 y.o.   MRN: [id number]  Chief Complaint  Patient presents with   Discuss paperwork    HPI Needs fmla-work comp cancelled.   So needs for back- pt.   Bp/anx 1 day/wk and 1-3 days.  Time for appts Dossie Gayer-  Discussed the use of AI scribe software for clinical note transcription with the patient, who gave verbal consent to proceed.  History of Present Illness Laurie Roman is a 48 year old female with chronic sciatica and hypertension who presents for medication management and follow-up.  She has chronic sciatica and back pain affecting both sides, with recent visits to a pain clinic and a urine drug screen. She is considering returning to physical therapy closer to her work due to [facility] restrictions. Physical therapy has provided some improvement, though she experiences pain for a day following sessions.  She is currently taking amlodipine  10 mg and losartan  100 mg for hypertension. She previously discontinued metoprolol  due to rapid blood pressure drops leading to hospital visits. Her blood pressure is often high, especially when in pain, and she experiences numbness in her back. She has not been regularly checking her blood pressure due to concerns about interference with her activities.  She has infrequent urination, sometimes going all day without the urge, which she attributes to nerve issues. She is not currently taking any potassium supplements but may need to start if her medication regimen changes.  Her medication list includes gabapentin  800 mg, hydrocodone  5 mg/325 mg, and Toradol . She has been using albuterol  more frequently due to allergies. She is not taking several medications listed in her chart, such as Abilify , Suboxone , and lorazepam , and is working on updating her medication list.  She experiences headaches following a fall where she hit the back of her head. These headaches differ from her usual  migraines, are located at the back of her head, and sometimes worsen with computer use. Her usual migraine medications have not provided relief, and she has not been thoroughly evaluated for these headaches.  She reports low hemoglobin levels and occasional hematemesis, raising concerns about potential gastrointestinal bleeding. She is not currently menstruating, which rules out menstrual bleeding as a cause for her low hemoglobin.  She has a history of scoliosis and arthritis in her back, contributing to her chronic pain. She is considering filing for Family Medical Leave Act [facility]) coverage due to her back pain, blood pressure issues, and anxiety, which occasionally require her to take time off work.  She has recently moved in with her boyfriend for her mental health and is in the process of selling her house. She works and has to manage her medical appointments around her job schedule.    Health Maintenance Due  Topic Date Due   Colonoscopy  Never done    Past Medical History:  Diagnosis Date   ADD (attention deficit disorder)    Anxiety    Asthma    Blood transfusion without reported diagnosis    Depression    GERD (gastroesophageal reflux disease)    IBS (irritable bowel syndrome)    Insomnia    Lumbago    Migraine    Opioid dependence (HCC) 08/21/2022   Explained 08/21/22 that 4 months hydrocodone  for pain management for delays in dental care has resulted in hyperalgesia and dependence   Other chest pain 08/17/2022   Normal EKG and D-dimer  Associated with lifting on  father Doesn't radiate  Nonexertional  Not substernal chest pain more to the side, feels related to sleeping on it wrong (reproducible)   Sciatica    Sleep apnea    Tietze's disease    Urinary incontinence 10/26/2022    Past Surgical History:  Procedure Laterality Date   APPENDECTOMY     CESAREAN SECTION     TONSILLECTOMY     TOTAL ABDOMINAL HYSTERECTOMY W/ BILATERAL SALPINGOOPHORECTOMY     TUBAL LIGATION        Current Outpatient Medications:    chlorthalidone  (HYGROTON ) 25 MG tablet, Take 1 tablet (25 mg total) by mouth daily., Disp: 30 tablet, Rfl: 1   HYDROcodone -acetaminophen  (NORCO/VICODIN) 5-325 MG tablet, Take 2 tablets by mouth every 6 (six) hours as needed for moderate pain (pain score 4-6)., Disp: , Rfl:    iron  polysaccharides (NIFEREX) 150 MG capsule, Take 1 capsule (150 mg total) by mouth daily., Disp: 90 capsule, Rfl: 1   albuterol  (VENTOLIN  HFA) 108 (90 Base) MCG/ACT inhaler, Inhale 2 puffs into the lungs every 6 (six) hours as needed for wheezing or shortness of breath., Disp: 18 g, Rfl: 3   amLODipine  (NORVASC ) 10 MG tablet, Take 1 tablet (10 mg total) by mouth daily. Replaces amlodipine  5, for uncontrolled blood pressure., Disp: 90 tablet, Rfl: 3   amphetamine -dextroamphetamine  (ADDERALL) 20 MG tablet, Take 1 tablet (20 mg total) by mouth 2 (two) times daily., Disp: 60 tablet, Rfl: 0   Ascorbic Acid (VITAMIN C ) 500 MG CHEW, Chew 1 tablet (500 mg total) by mouth daily at 6 (six) AM. Take with methenamine , Disp: 90 tablet, Rfl: 3   Cranberry-D Mannose 158-500 MG CAPS, Take 1 Capful by mouth daily at 6 (six) AM., Disp: 90 capsule, Rfl: 3   Cyanocobalamin  (B-12) 1000 MCG SUBL, Place 1 tablet under the tongue daily at 6 (six) AM., Disp: 100 tablet, Rfl: 3   Erenumab -aooe (AIMOVIG ) 140 MG/ML SOAJ, Inject 140 mg into the skin every 30 (thirty) days., Disp: 1 mL, Rfl: 3   Estradiol  10 MCG TABS vaginal tablet, Place 1 tablet (10 mcg total) vaginally at bedtime for 14 days, THEN 1 tablet (10 mcg total) 2 (two) times a week. Insert one tablet in the vagina twice a week.., Disp: 24 tablet, Rfl: 4   fluconazole  (DIFLUCAN ) 150 MG tablet, Take 1 tablet (150 mg total) by mouth every three (3) days as needed., Disp: 2 tablet, Rfl: 1   fluticasone  (FLONASE ) 50 MCG/ACT nasal spray, Place 1 spray into both nostrils daily., Disp: 48 g, Rfl: 3   gabapentin  (NEURONTIN ) 800 MG tablet, Take 1 tablet (800 mg  total) by mouth 4 (four) times daily as needed. Replaces prior dosing schedule., Disp: 270 tablet, Rfl: 1   ketorolac  (TORADOL ) 10 MG tablet, Take 1 tablet (10 mg total) by mouth every 6 (six) hours as needed., Disp: 20 tablet, Rfl: 0   lidocaine  (HM LIDOCAINE  PATCH) 4 %, Place 1 patch onto the skin daily., Disp: 30 patch, Rfl: 1   losartan  (COZAAR ) 100 MG tablet, Take 1 tablet (100 mg total) by mouth daily., Disp: 90 tablet, Rfl: 1   methenamine  (HIPREX ) 1 g tablet, Take 1 tablet (1 g total) by mouth 2 (two) times daily with a meal., Disp: 68 tablet, Rfl: 3   methocarbamol  (ROBAXIN ) 500 MG tablet, Take 2 tablets (1,000 mg total) by mouth every 6 (six) hours as needed for muscle spasms., Disp: 180 tablet, Rfl: 0   nystatin  (MYCOSTATIN ) 100000 UNIT/ML suspension, Take 5 mLs (  500,000 Units total) by mouth 4 (four) times daily. Swish and hold in mouth few seconds and swallow, Disp: 60 mL, Rfl: 0   nystatin  (MYCOSTATIN /NYSTOP ) powder, Apply 1 Application topically 3 (three) times daily., Disp: 60 g, Rfl: 11   nystatin  cream (MYCOSTATIN ), Apply 1 Application topically 2 (two) times daily., Disp: 30 g, Rfl: 5   ondansetron  (ZOFRAN ) 4 MG tablet, Take 1 tablet (4 mg total) by mouth every 8 (eight) hours as needed for nausea or vomiting., Disp: 20 tablet, Rfl: 1   pantoprazole  (PROTONIX ) 40 MG tablet, Take 1 tablet (40 mg total) by mouth 2 (two) times daily before a meal., Disp: 180 tablet, Rfl: 1   phenazopyridine  (PYRIDIUM ) 200 MG tablet, Take 1 tablet (200 mg total) by mouth 3 (three) times daily., Disp: 60 tablet, Rfl: 0   potassium chloride  (KLOR-CON  M) 20 MEQ tablet, Take 1 tablet (20 mEq total) by mouth daily., Disp: 90 tablet, Rfl: 0   Probiotic Product (PROBIOTIC DAILY) CAPS, Take 1 capsule by mouth daily., Disp: 30 capsule, Rfl: 2   promethazine  (PHENERGAN ) 25 MG tablet, Take 25 mg by mouth every 6 (six) hours as needed., Disp: , Rfl:    Rimegepant Sulfate (NURTEC) 75 MG TBDP, Take 1 tablet (75 mg  total) by mouth once a week., Disp: 30 tablet, Rfl: 2   rizatriptan  (MAXALT ) 10 MG tablet, Take 1 tablet (10 mg total) by mouth as needed for migraine. May repeat in 2 hours if needed, Disp: 10 tablet, Rfl: 3   scopolamine  (TRANSDERM-SCOP) 1 MG/3DAYS, Place 1 patch (1.5 mg total) onto the skin every 3 (three) days., Disp: 10 patch, Rfl: 2  Allergies  Allergen Reactions   Cefazolin Hives    Only IV formulation can safely take by mouth keflex    Ondansetron  Hives   Other Hives    Odansetron injection.   Sulfa Antibiotics Rash   Tramadol Hcl     Other reaction(s): chest pain   Amitriptyline      hallucination   Galcanezumab-Gnlm Other (See Comments)    galcanezumab-gnlm   Ultram [Tramadol] Other (See Comments)    Tachycardia   Nitrofurantoin  Itching    Red welts.  nitrofurantoin    Oxycodone  Rash    Only when taking multiple doses   Sulfasalazine Rash   ROS neg/noncontributory except as noted HPI/below Injury at work-head and back-now not considered workman's comp so re-eval left to PCP     Objective:     BP 132/86 (BP Location: Left Arm, Patient Position: Sitting, Cuff Size: Normal)   Pulse 86   Temp 97.7 F (36.5 C)   Ht 4\' 11"  (1.499 m)   Wt 138 lb 1.6 oz (62.6 kg)   SpO2 100%   BMI 27.89 kg/m  Wt Readings from Last 3 Encounters:  09/28/23 138 lb 1.6 oz (62.6 kg)  09/10/23 137 lb 9.6 oz (62.4 kg)  09/08/23 139 lb (63 kg)    Physical Exam   Gen: WDWN NAD HEENT: NCAT, conjunctiva not injected, sclera nonicteric CARDIAC: RRR, S1S2+, no murmur.  EXT:  no edema MSK: no gross abnormalities.  NEURO: A&O x3.  CN II-XII intact.  PSYCH: normal mood. Good eye contact Face flushed.   Reviewed labs on phone.  UDS done at pain mgmt yesterday is pending.  Pt will get copy when available.  Spent w/pt going over meds, pain mgmt lab results, listening to what is happening w/workman's comp, etc.  Will still need to fill out paperwork when pt brings it  Assessment &  Plan:  Left sided sciatica -     Ambulatory referral to Physical Therapy  Right sided sciatica -     Ambulatory referral to Physical Therapy  Post-concussion headache -     Ambulatory referral to Sports Medicine  Essential hypertension -     Chlorthalidone ; Take 1 tablet (25 mg total) by mouth daily.  Dispense: 30 tablet; Refill: 1  Vitamin B 12 deficiency -     Cyanocobalamin   Chronic bilateral low back pain with right-sided sciatica -     Ketorolac  Tromethamine   Iron  deficiency anemia, unspecified iron  deficiency anemia type  Other orders -     Polysaccharide Iron  Complex; Take 1 capsule (150 mg total) by mouth daily.  Dispense: 90 capsule; Refill: 1  Assessment and Plan Assessment & Plan Hypertension   Blood pressure is elevated at approximately 140/80 mmHg. Current medications include amlodipine  10 mg and losartan  100 mg. Metoprolol  was discontinued due to hypotension. A diuretic will be added to manage blood pressure and address infrequent urination. Discontinue metoprolol  and initiate chlorthalidone , starting with half a tablet daily. Order blood work in a few weeks to monitor potassium levels.  Anxiety   Anxiety contributes to elevated blood pressure and work absences. FMLA paperwork will include anxiety to cover work absences.  Chronic sciatica and back pain   Chronic bilateral sciatica and back pain are managed with a pain clinic and consideration of physical therapy. Previous physical therapy was beneficial, and she prefers [facility] for convenience. Refer to physical therapy at [facility] and file FMLA paperwork to cover absences related to back pain.  Headaches post head injury   Persistent daily headaches and difficulty with visual tasks following a head injury suggest post-concussion syndrome. Previous neurologist consultation was unsatisfactory and cannot continue as accident not considered workman's comp. Refer to sports medicine for evaluation of post-concussion  symptoms.  Anemia   Anemia with low hemoglobin levels and episodes of hematemesis requires further evaluation. A previous GI referral was canceled. Reschedule the appointment with a GI specialist at [facility]. Consider starting iron  supplementation, pending insurance coverage for Niferex.  GERD   GERD is managed with pantoprazole  taken twice daily. Occasional non-compliance with the medication regimen and episodes of hematemesis were noted. Consistent medication use is necessary to manage symptoms. Transfer pantoprazole  prescription to [facility] pharmacy for refill.  Vitamin B12 Deficiency   Low B12 levels and fatigue are present. She has not been on B12 shots for a while and will restart the regimen. Initiate B12 injections once weekly for four weeks, then monthly.  Follow-up   Follow-up plans include ongoing management of conditions. Schedule a follow-up appointment in one month for blood work and reassessment. Ensure FMLA paperwork is completed and submitted.    Return in about 4 weeks (around 10/26/2023) for chronic follow-up.  Ellsworth Haas, MD

## 2023-09-29 ENCOUNTER — Other Ambulatory Visit: Payer: Self-pay | Admitting: *Deleted

## 2023-09-29 ENCOUNTER — Telehealth: Payer: Self-pay | Admitting: Family Medicine

## 2023-09-29 ENCOUNTER — Other Ambulatory Visit (HOSPITAL_COMMUNITY): Payer: Self-pay

## 2023-09-29 ENCOUNTER — Other Ambulatory Visit: Payer: Self-pay | Admitting: Family Medicine

## 2023-09-29 DIAGNOSIS — M5432 Sciatica, left side: Secondary | ICD-10-CM

## 2023-09-29 DIAGNOSIS — M5416 Radiculopathy, lumbar region: Secondary | ICD-10-CM

## 2023-09-29 DIAGNOSIS — R202 Paresthesia of skin: Secondary | ICD-10-CM

## 2023-09-29 DIAGNOSIS — F902 Attention-deficit hyperactivity disorder, combined type: Secondary | ICD-10-CM

## 2023-09-29 DIAGNOSIS — F11288 Opioid dependence with other opioid-induced disorder: Secondary | ICD-10-CM

## 2023-09-29 DIAGNOSIS — R2681 Unsteadiness on feet: Secondary | ICD-10-CM

## 2023-09-29 DIAGNOSIS — M5116 Intervertebral disc disorders with radiculopathy, lumbar region: Secondary | ICD-10-CM

## 2023-09-29 DIAGNOSIS — S3992XA Unspecified injury of lower back, initial encounter: Secondary | ICD-10-CM

## 2023-09-29 DIAGNOSIS — G8929 Other chronic pain: Secondary | ICD-10-CM

## 2023-09-29 DIAGNOSIS — S79911A Unspecified injury of right hip, initial encounter: Secondary | ICD-10-CM

## 2023-09-29 MED ORDER — METHOCARBAMOL 500 MG PO TABS
1000.0000 mg | ORAL_TABLET | Freq: Four times a day (QID) | ORAL | 0 refills | Status: DC | PRN
Start: 2023-09-29 — End: 2023-11-05

## 2023-09-29 MED ORDER — GABAPENTIN 800 MG PO TABS: 800.0000 mg | ORAL_TABLET | Freq: Four times a day (QID) | ORAL | 1 refills | Status: DC | PRN

## 2023-09-29 MED ORDER — AMPHETAMINE-DEXTROAMPHETAMINE 20 MG PO TABS: 20.0000 mg | ORAL_TABLET | Freq: Two times a day (BID) | ORAL | 0 refills | Status: DC

## 2023-09-29 MED ORDER — BUDESONIDE-FORMOTEROL FUMARATE 160-4.5 MCG/ACT IN AERO: 2.0000 | INHALATION_SPRAY | Freq: Two times a day (BID) | RESPIRATORY_TRACT | 3 refills | Status: DC

## 2023-09-29 NOTE — Telephone Encounter (Signed)
 Prescription Request  09/29/2023  LOV: 09/28/2023  What is the name of the medication or equipment?  gabapentin  (NEURONTIN ) 800 MG tablet   Have you contacted your pharmacy to request a refill? Yes   Which pharmacy would you like this sent to?   [facility] - [place], [place] - [id number]  [address] Phone: [phone]  Fax: [phone]      Patient notified that their request is being sent to the clinical staff for review and that they should receive a response within 2 business days.   Please advise at Mobile [phone] (mobile)

## 2023-10-04 ENCOUNTER — Telehealth: Payer: Self-pay | Admitting: Family Medicine

## 2023-10-04 NOTE — Telephone Encounter (Signed)
 [employer] absence management faxed document FMLA, to be filled out by provider. Patient requested to send it back via Fax within ASAP. Document is located in providers tray at front office.Please advise at  [phone].

## 2023-10-06 ENCOUNTER — Other Ambulatory Visit (HOSPITAL_COMMUNITY): Payer: Self-pay

## 2023-10-06 NOTE — Telephone Encounter (Signed)
Form faxed back to [employer].

## 2023-10-11 ENCOUNTER — Other Ambulatory Visit (HOSPITAL_COMMUNITY): Payer: Self-pay

## 2023-10-11 NOTE — Telephone Encounter (Signed)
 Per insurance, the appeal has been approved as of 10/11/2023.

## 2023-10-13 ENCOUNTER — Other Ambulatory Visit: Payer: Self-pay | Admitting: Family Medicine

## 2023-10-14 ENCOUNTER — Ambulatory Visit: Admitting: *Deleted

## 2023-10-14 ENCOUNTER — Ambulatory Visit

## 2023-10-14 DIAGNOSIS — E538 Deficiency of other specified B group vitamins: Secondary | ICD-10-CM | POA: Diagnosis not present

## 2023-10-14 MED ORDER — CYANOCOBALAMIN 1000 MCG/ML IJ SOLN
1000.0000 ug | Freq: Once | INTRAMUSCULAR | Status: AC
  Administered 2023-10-14: 1000 ug via INTRAMUSCULAR

## 2023-10-14 NOTE — Progress Notes (Signed)
Per orders of Dr. Kulik, injection of B 12 given in right deltoid per patient preference by QuaNeisha S Jones, CMA. Patient tolerated injection well. Patient reminded to schedule next injection.   

## 2023-10-22 DIAGNOSIS — Z79899 Other long term (current) drug therapy: Secondary | ICD-10-CM | POA: Diagnosis not present

## 2023-10-26 DIAGNOSIS — Z79899 Other long term (current) drug therapy: Secondary | ICD-10-CM | POA: Diagnosis not present

## 2023-10-27 ENCOUNTER — Ambulatory Visit (INDEPENDENT_AMBULATORY_CARE_PROVIDER_SITE_OTHER): Admitting: Family Medicine

## 2023-10-27 ENCOUNTER — Other Ambulatory Visit: Payer: Self-pay | Admitting: Family Medicine

## 2023-10-27 ENCOUNTER — Encounter: Payer: Self-pay | Admitting: Family Medicine

## 2023-10-27 VITALS — BP 142/90 | HR 109 | Temp 97.5°F | Resp 18 | Ht 59.0 in | Wt 131.0 lb

## 2023-10-27 DIAGNOSIS — M3500 Sicca syndrome, unspecified: Secondary | ICD-10-CM | POA: Diagnosis not present

## 2023-10-27 DIAGNOSIS — I10 Essential (primary) hypertension: Secondary | ICD-10-CM

## 2023-10-27 DIAGNOSIS — M2550 Pain in unspecified joint: Secondary | ICD-10-CM | POA: Diagnosis not present

## 2023-10-27 DIAGNOSIS — F902 Attention-deficit hyperactivity disorder, combined type: Secondary | ICD-10-CM | POA: Diagnosis not present

## 2023-10-27 DIAGNOSIS — E611 Iron deficiency: Secondary | ICD-10-CM

## 2023-10-27 DIAGNOSIS — M5432 Sciatica, left side: Secondary | ICD-10-CM

## 2023-10-27 DIAGNOSIS — H04123 Dry eye syndrome of bilateral lacrimal glands: Secondary | ICD-10-CM | POA: Diagnosis not present

## 2023-10-27 DIAGNOSIS — E538 Deficiency of other specified B group vitamins: Secondary | ICD-10-CM | POA: Diagnosis not present

## 2023-10-27 MED ORDER — KETOROLAC TROMETHAMINE 60 MG/2ML IM SOLN
60.0000 mg | Freq: Once | INTRAMUSCULAR | Status: AC
  Administered 2023-10-27: 60 mg via INTRAMUSCULAR

## 2023-10-27 MED ORDER — AMPHETAMINE-DEXTROAMPHETAMINE 20 MG PO TABS: 20.0000 mg | ORAL_TABLET | Freq: Two times a day (BID) | ORAL | 0 refills | Status: DC

## 2023-10-27 NOTE — Patient Instructions (Signed)
 Take ibuprofen, etc.  Wear compression stockings

## 2023-10-27 NOTE — Progress Notes (Signed)
 Subjective:     Patient ID: Laurie Roman, female    DOB: May 30, 1976, 48 y.o.   MRN: [id number]  Chief Complaint  Patient presents with   Leg Pain    Pain in the back of left leg that started 2 days ago    HPI Discussed the use of AI scribe software for clinical note transcription with the patient, who gave verbal consent to proceed.  History of Present Illness JUSTYCE Roman is a 48 year old female who presents with severe pain in the back of her left upper thigh.  She has been experiencing severe pain in the back of her left upper thigh for the past one to two days, with worsening symptoms last night. The pain is described as similar to a 'charley horse' but more intense, and touching the area exacerbates the discomfort. There is no history of recent exercise or injury to the area. The pain has disrupted her sleep despite taking her regular pain medications, including gabapentin  andtezanidine for sleep. Concerned about it as never had this before.  Does have chronic. LBP and sciatica.    She reports swelling in her ankles and feet, which has been more pronounced over the past month and a half. She has a history of swelling but notes that it has not been this severe before. She is currently taking amlodipine  10 mg daily. And chlorthalidone .  Stands on feet all day  She has been experiencing dry eyes and dry mouth for about six months, initially attributing these symptoms to new medication. She uses eye drops frequently and describes her eyes as feeling like 'sandpaper' in the mornings. She is concerned about these symptoms being Sjogren's.  She discusses episodes of lightheadedness and dizziness, particularly when standing up quickly, which she associates with a rapid heart rate. Her current medications include amlodipine  10 mg daily, gabapentin , tazinidine for sleep, and she occasionally uses a scopolamine  patch, though not recently due to reduced nausea. She also takes a water pill,  chlorthalidone , which she acknowledges can cause dryness.  She has a history of high blood pressure, with a recent reading of 142/90, and mentions a previous episode where her blood pressure was significantly elevated at 200/113. She is also concerned about her elevated heart rate and mentions possibly POTS  Had some labs done from pain mgmt  concerns about +RF.  Also, anemia and B12 def-needs repeat labs    Health Maintenance Due  Topic Date Due   Colonoscopy  Never done    Past Medical History:  Diagnosis Date   ADD (attention deficit disorder)    Anxiety    Asthma    Blood transfusion without reported diagnosis    Depression    GERD (gastroesophageal reflux disease)    IBS (irritable bowel syndrome)    Insomnia    Lumbago    Migraine    Opioid dependence (HCC) 08/21/2022   Explained 08/21/22 that 4 months hydrocodone  for pain management for delays in dental care has resulted in hyperalgesia and dependence   Other chest pain 08/17/2022   Normal EKG and D-dimer  Associated with lifting on father Doesn't radiate  Nonexertional  Not substernal chest pain more to the side, feels related to sleeping on it wrong (reproducible)   Sciatica    Sleep apnea    Tietze's disease    Urinary incontinence 10/26/2022    Past Surgical History:  Procedure Laterality Date   APPENDECTOMY     CESAREAN SECTION  TONSILLECTOMY     TOTAL ABDOMINAL HYSTERECTOMY W/ BILATERAL SALPINGOOPHORECTOMY     TUBAL LIGATION       Current Outpatient Medications:    albuterol  (VENTOLIN  HFA) 108 (90 Base) MCG/ACT inhaler, Inhale 2 puffs into the lungs every 6 (six) hours as needed for wheezing or shortness of breath., Disp: 18 g, Rfl: 3   amLODipine  (NORVASC ) 10 MG tablet, Take 1 tablet (10 mg total) by mouth daily. Replaces amlodipine  5, for uncontrolled blood pressure., Disp: 90 tablet, Rfl: 3   Ascorbic Acid (VITAMIN C ) 500 MG CHEW, Chew 1 tablet (500 mg total) by mouth daily at 6 (six) AM. Take with  methenamine , Disp: 90 tablet, Rfl: 3   budesonide -formoterol  (SYMBICORT ) 160-4.5 MCG/ACT inhaler, Inhale 2 puffs into the lungs 2 (two) times daily., Disp: 3 each, Rfl: 3   Cranberry-D Mannose 158-500 MG CAPS, Take 1 Capful by mouth daily at 6 (six) AM., Disp: 90 capsule, Rfl: 3   Cyanocobalamin  (B-12) 1000 MCG SUBL, Place 1 tablet under the tongue daily at 6 (six) AM., Disp: 100 tablet, Rfl: 3   Erenumab -aooe (AIMOVIG ) 140 MG/ML SOAJ, Inject 140 mg into the skin every 30 (thirty) days., Disp: 1 mL, Rfl: 3   Estradiol  10 MCG TABS vaginal tablet, Place 1 tablet (10 mcg total) vaginally at bedtime for 14 days, THEN 1 tablet (10 mcg total) 2 (two) times a week. Insert one tablet in the vagina twice a week.., Disp: 24 tablet, Rfl: 4   fluconazole  (DIFLUCAN ) 150 MG tablet, Take 1 tablet (150 mg total) by mouth every three (3) days as needed., Disp: 2 tablet, Rfl: 1   fluticasone  (FLONASE ) 50 MCG/ACT nasal spray, Place 1 spray into both nostrils daily., Disp: 48 g, Rfl: 3   gabapentin  (NEURONTIN ) 800 MG tablet, Take 1 tablet (800 mg total) by mouth 4 (four) times daily as needed. Replaces prior dosing schedule., Disp: 270 tablet, Rfl: 1   HYDROcodone -acetaminophen  (NORCO/VICODIN) 5-325 MG tablet, Take 2 tablets by mouth every 6 (six) hours as needed for moderate pain (pain score 4-6)., Disp: , Rfl:    iron  polysaccharides (NIFEREX) 150 MG capsule, Take 1 capsule (150 mg total) by mouth daily., Disp: 90 capsule, Rfl: 1   ketorolac  (TORADOL ) 10 MG tablet, Take 1 tablet (10 mg total) by mouth every 6 (six) hours as needed., Disp: 20 tablet, Rfl: 0   lidocaine  (HM LIDOCAINE  PATCH) 4 %, Place 1 patch onto the skin daily., Disp: 30 patch, Rfl: 1   losartan  (COZAAR ) 100 MG tablet, Take 1 tablet (100 mg total) by mouth daily., Disp: 90 tablet, Rfl: 1   methenamine  (HIPREX ) 1 g tablet, Take 1 tablet (1 g total) by mouth 2 (two) times daily with a meal., Disp: 68 tablet, Rfl: 3   methocarbamol  (ROBAXIN ) 500 MG  tablet, Take 2 tablets (1,000 mg total) by mouth every 6 (six) hours as needed for muscle spasms., Disp: 180 tablet, Rfl: 0   nystatin  (MYCOSTATIN ) 100000 UNIT/ML suspension, Take 5 mLs (500,000 Units total) by mouth 4 (four) times daily. Swish and hold in mouth few seconds and swallow, Disp: 60 mL, Rfl: 0   nystatin  (MYCOSTATIN /NYSTOP ) powder, Apply 1 Application topically 3 (three) times daily., Disp: 60 g, Rfl: 11   nystatin  cream (MYCOSTATIN ), Apply 1 Application topically 2 (two) times daily., Disp: 30 g, Rfl: 5   ondansetron  (ZOFRAN ) 4 MG tablet, Take 1 tablet (4 mg total) by mouth every 8 (eight) hours as needed for nausea or vomiting., Disp: 20 tablet,  Rfl: 1   pantoprazole  (PROTONIX ) 40 MG tablet, Take 1 tablet (40 mg total) by mouth 2 (two) times daily before a meal., Disp: 180 tablet, Rfl: 1   phenazopyridine  (PYRIDIUM ) 200 MG tablet, Take 1 tablet (200 mg total) by mouth 3 (three) times daily., Disp: 60 tablet, Rfl: 0   potassium chloride  (KLOR-CON  M) 20 MEQ tablet, Take 1 tablet (20 mEq total) by mouth daily., Disp: 90 tablet, Rfl: 0   Probiotic Product (PROBIOTIC DAILY) CAPS, Take 1 capsule by mouth daily., Disp: 30 capsule, Rfl: 2   promethazine  (PHENERGAN ) 25 MG tablet, Take 25 mg by mouth every 6 (six) hours as needed., Disp: , Rfl:    Rimegepant Sulfate (NURTEC) 75 MG TBDP, Take 1 tablet (75 mg total) by mouth once a week., Disp: 30 tablet, Rfl: 2   rizatriptan  (MAXALT ) 10 MG tablet, Take 1 tablet (10 mg total) by mouth as needed for migraine. May repeat in 2 hours if needed, Disp: 10 tablet, Rfl: 3   scopolamine  (TRANSDERM-SCOP) 1 MG/3DAYS, Place 1 patch (1.5 mg total) onto the skin every 3 (three) days., Disp: 10 patch, Rfl: 2   tiZANidine  (ZANAFLEX ) 4 MG tablet, Take 4 mg by mouth 3 (three) times daily., Disp: , Rfl:    amphetamine -dextroamphetamine  (ADDERALL) 20 MG tablet, Take 1 tablet (20 mg total) by mouth 2 (two) times daily., Disp: 60 tablet, Rfl: 0   chlorthalidone   (HYGROTON ) 25 MG tablet, Take 1 tablet (25 mg total) by mouth daily., Disp: 30 tablet, Rfl: 1  Allergies  Allergen Reactions   Cefazolin Hives    Only IV formulation can safely take by mouth keflex    Ondansetron  Hives   Other Hives    Odansetron injection.   Sulfa Antibiotics Rash and Dermatitis   Tramadol Hcl     Other reaction(s): chest pain   Amitriptyline      hallucination   Galcanezumab-Gnlm Other (See Comments)    galcanezumab-gnlm   Ultram [Tramadol] Other (See Comments)    Tachycardia   Nitrofurantoin  Itching    Red welts.  nitrofurantoin    Oxycodone  Rash and Dermatitis    Only when taking multiple doses   Sulfasalazine Rash and Dermatitis   ROS neg/noncontributory except as noted HPI/below      Objective:      BP (!) 142/90 (BP Location: Left Arm, Patient Position: Sitting, Cuff Size: Normal)   Pulse (!) 109   Temp (!) 97.5 F (36.4 C) (Temporal)   Resp 18   Ht 4\' 11"  (1.499 m)   Wt 131 lb (59.4 kg)   SpO2 95%   BMI 26.46 kg/m  Wt Readings from Last 3 Encounters:  10/27/23 131 lb (59.4 kg)  09/28/23 138 lb 1.6 oz (62.6 kg)  09/10/23 137 lb 9.6 oz (62.4 kg)    Physical Exam   Gen: WDWN NAD HEENT: NCAT, conjunctiva not injected, sclera nonicteric NECK:  supple, no thyromegaly, no nodes, no carotid bruits CARDIAC: tachyRRR, S1S2+, no murmur. DP 2+B LUNGS: CTAB. No wheezes ABDOMEN:  BS+, soft, NTND, No HSM, no masses EXT:  1+ bipedal/ankle edema MSK: no gross abnormalities.  Back:  can stand on heels/toes/1 leg.  No TTP. No SI tenderness. No rash.  MS 5/5 BLE.   SLR neg B but does feel a "pulling" on L  Good ROM   some TTP posterior L thigh and calf  NEURO: A&O x3.  CN II-XII intact.  PSYCH: normal mood. Good eye contact  5/13-ana neg,  RF 25(<14), esr 6, uric  3.8  cr 0.6 Results LABS ANA: negative (10/19/2023) Rheumatoid factor: 25 (normal <14) (10/19/2023) Sed rate: 6 mm/hr (10/19/2023) Uric acid: 3.8 mg/dL (40/98/1191) Creatinine: 0.6  mg/dL (47/82/9562) Total protein: 7.7 g/dL (13/01/6577) Albumin: 5 g/dL (46/96/2952) Globulin: 2.7 g/dL (84/13/2440) Total bilirubin: 0.24 mg/dL (04/04/2535) Calcium: 10 mg/dL (64/40/3474) Alkaline phosphatase (ALP): 81 U/L (10/19/2023) Aspartate aminotransferase (AST): 17 U/L (10/19/2023) Alanine aminotransferase (ALT): 10 U/L (10/19/2023) Sodium: 142 mmol/L (10/19/2023) Potassium: 3.9 mmol/L (10/19/2023) Chloride: 104 mmol/L (10/19/2023) Bicarbonate: 22.5 mmol/L (10/19/2023) Glucose: 78 mg/dL (25/95/6387) Blood urea nitrogen (BUN): 16 mg/dL (56/43/3295) Creatinine: 0.6 mg/dL (18/84/1660) Urine drug screen: positive hydrocodone , posative amphetamines (09/27/2023)      Assessment & Plan:  Left sided sciatica -     Magnesium -     Ketorolac  Tromethamine   Dry eye syndrome of both eyes -     Sjogrens syndrome-A extractable nuclear antibody -     Sjogrens syndrome-B extractable nuclear antibody  Sicca syndrome (HCC) -     Sjogrens syndrome-A extractable nuclear antibody -     Sjogrens syndrome-B extractable nuclear antibody  B12 deficiency  Iron  deficiency -     CBC with Differential/Platelet -     Vitamin B12 -     IBC + Ferritin  Attention deficit hyperactivity disorder (ADHD), combined type -     Amphetamine -Dextroamphetamine ; Take 1 tablet (20 mg total) by mouth 2 (two) times daily.  Dispense: 60 tablet; Refill: 0  Polyarthralgia -     Rheumatoid factor -     Cyclic citrul peptide antibody, IgG  Assessment and Plan Assessment & Plan Sciatica   Severe, persistent pain in the back o, with a differential diagnosis of charley horse. There is no recent injury, fall, rash, or blood clot. Had recent enough MRI to say no tumor, etc.  May have flared the sciacital.  Administer a Toradol  injection and continue ibuprofen or meloxicam  for pain management.  Hypertension   Blood pressure is 142/90 mmHg, with previous readings as high as 200/113 mmHg. Stress and medication side  effects may contribute. Monitor blood pressure regularly and discuss lifestyle modifications to manage stress.  Peripheral edema   Ankle and foot swelling has worsened over 1-2 months, possibly due to amlodipine  use and high salt intake. Consider reducing salt intake and evaluate the need for medication adjustment. Wear compression stockings  Anxiety   Anxiety symptoms include lightheadedness, dizziness, and palpitations, possibly exacerbated by stress and related to POTS. Lightheadedness upon standing suggests orthostatic intolerance. Evaluate for POTS and consider anxiety management strategies.  Dry eyes and mouth   Persistent dry eyes and mouth for 6 months suggest suspected Sjgren's syndrome, with severe morning dryness requiring frequent eye drops. Order a blood test for Sjgren's syndrome  Rheumatoid factor elevation   Rheumatoid factor is elevated at 25 (normal <14), with a negative ANA test and no joint inflammation. Previous tests were conducted at a pain clinic. Consider referral to a rheumatologist for further evaluation.    Return for reg f/u soon.  Ellsworth Haas, MD

## 2023-11-04 ENCOUNTER — Other Ambulatory Visit (INDEPENDENT_AMBULATORY_CARE_PROVIDER_SITE_OTHER)

## 2023-11-04 ENCOUNTER — Other Ambulatory Visit: Payer: Self-pay | Admitting: *Deleted

## 2023-11-04 DIAGNOSIS — M5431 Sciatica, right side: Secondary | ICD-10-CM | POA: Diagnosis not present

## 2023-11-04 DIAGNOSIS — M542 Cervicalgia: Secondary | ICD-10-CM

## 2023-11-04 DIAGNOSIS — H04123 Dry eye syndrome of bilateral lacrimal glands: Secondary | ICD-10-CM | POA: Diagnosis not present

## 2023-11-04 DIAGNOSIS — M3500 Sicca syndrome, unspecified: Secondary | ICD-10-CM | POA: Diagnosis not present

## 2023-11-04 DIAGNOSIS — R21 Rash and other nonspecific skin eruption: Secondary | ICD-10-CM | POA: Diagnosis not present

## 2023-11-04 DIAGNOSIS — M2550 Pain in unspecified joint: Secondary | ICD-10-CM | POA: Diagnosis not present

## 2023-11-04 LAB — CBC WITH DIFFERENTIAL/PLATELET
Basophils Absolute: 0.1 10*3/uL (ref 0.0–0.1)
Basophils Relative: 0.6 % (ref 0.0–3.0)
Eosinophils Absolute: 0.1 10*3/uL (ref 0.0–0.7)
Eosinophils Relative: 1.1 % (ref 0.0–5.0)
HCT: 34.7 % — ABNORMAL LOW (ref 36.0–46.0)
Hemoglobin: 11.7 g/dL — ABNORMAL LOW (ref 12.0–15.0)
Lymphocytes Relative: 29.1 % (ref 12.0–46.0)
Lymphs Abs: 2.8 10*3/uL (ref 0.7–4.0)
MCHC: 33.7 g/dL (ref 30.0–36.0)
MCV: 90 fl (ref 78.0–100.0)
Monocytes Absolute: 0.6 10*3/uL (ref 0.1–1.0)
Monocytes Relative: 5.9 % (ref 3.0–12.0)
Neutro Abs: 6 10*3/uL (ref 1.4–7.7)
Neutrophils Relative %: 63.3 % (ref 43.0–77.0)
Platelets: 316 10*3/uL (ref 150.0–400.0)
RBC: 3.85 Mil/uL — ABNORMAL LOW (ref 3.87–5.11)
RDW: 15.1 % (ref 11.5–15.5)
WBC: 9.5 10*3/uL (ref 4.0–10.5)

## 2023-11-04 LAB — IBC + FERRITIN
Ferritin: 27.1 ng/mL (ref 10.0–291.0)
Iron: 51 ug/dL (ref 42–145)
Saturation Ratios: 13.1 % — ABNORMAL LOW (ref 20.0–50.0)
TIBC: 389.2 ug/dL (ref 250.0–450.0)
Transferrin: 278 mg/dL (ref 212.0–360.0)

## 2023-11-04 LAB — VITAMIN B12: Vitamin B-12: 741 pg/mL (ref 211–911)

## 2023-11-04 LAB — MAGNESIUM: Magnesium: 1.9 mg/dL (ref 1.5–2.5)

## 2023-11-05 ENCOUNTER — Encounter: Payer: Self-pay | Admitting: Family Medicine

## 2023-11-05 ENCOUNTER — Ambulatory Visit (INDEPENDENT_AMBULATORY_CARE_PROVIDER_SITE_OTHER): Admitting: Family Medicine

## 2023-11-05 VITALS — BP 144/98 | HR 100 | Temp 98.2°F | Resp 18 | Ht 59.0 in | Wt 136.2 lb

## 2023-11-05 DIAGNOSIS — F5101 Primary insomnia: Secondary | ICD-10-CM | POA: Diagnosis not present

## 2023-11-05 DIAGNOSIS — R21 Rash and other nonspecific skin eruption: Secondary | ICD-10-CM

## 2023-11-05 DIAGNOSIS — F902 Attention-deficit hyperactivity disorder, combined type: Secondary | ICD-10-CM | POA: Diagnosis not present

## 2023-11-05 DIAGNOSIS — I10 Essential (primary) hypertension: Secondary | ICD-10-CM

## 2023-11-05 DIAGNOSIS — G8929 Other chronic pain: Secondary | ICD-10-CM | POA: Diagnosis not present

## 2023-11-05 DIAGNOSIS — N390 Urinary tract infection, site not specified: Secondary | ICD-10-CM | POA: Diagnosis not present

## 2023-11-05 DIAGNOSIS — E538 Deficiency of other specified B group vitamins: Secondary | ICD-10-CM

## 2023-11-05 DIAGNOSIS — M5441 Lumbago with sciatica, right side: Secondary | ICD-10-CM | POA: Diagnosis not present

## 2023-11-05 LAB — SEDIMENTATION RATE: Sed Rate: 5 mm/h (ref 0–20)

## 2023-11-05 MED ORDER — AMPHETAMINE-DEXTROAMPHET ER 20 MG PO CP24: 20.0000 mg | ORAL_CAPSULE | Freq: Every day | ORAL | 0 refills | Status: DC

## 2023-11-05 MED ORDER — MIRTAZAPINE 15 MG PO TABS: 15.0000 mg | ORAL_TABLET | Freq: Every day | ORAL | 1 refills | Status: DC

## 2023-11-05 MED ORDER — VALSARTAN 320 MG PO TABS: 320.0000 mg | ORAL_TABLET | Freq: Every day | ORAL | 3 refills | Status: DC

## 2023-11-05 MED ORDER — METHOCARBAMOL 1000 MG PO TABS: 1000.0000 mg | ORAL_TABLET | Freq: Two times a day (BID) | ORAL | 1 refills | Status: DC | PRN

## 2023-11-05 MED ORDER — CYANOCOBALAMIN 1000 MCG/ML IJ SOLN
1000.0000 ug | Freq: Once | INTRAMUSCULAR | Status: AC
  Administered 2023-11-05: 1000 ug via INTRAMUSCULAR

## 2023-11-05 NOTE — Progress Notes (Signed)
 Subjective:     Patient ID: Laurie Roman, female    DOB: 1975-07-19, 48 y.o.   MRN: [id number]  Chief Complaint  Patient presents with   Medical Management of Chronic Issues    Follow-up     HPI Discussed the use of AI scribe software for clinical note transcription with the patient, who gave verbal consent to proceed.  History of Present Illness Laurie Roman is a 48 year old female with mult complaints who presents with dry mouth and joint pain.  She experiences persistent dry mouth and dry eyes, which are significant enough to require the use of rewetting drops, especially in the mornings when her eyes feel like 'sandpaper.' Her lips are constantly chapped, and she is unable to produce saliva at night. She suspects these symptoms may be related to Sjogren's syndrome. Labs still pending.  Requesting ANA as frequently has rash on cheeks and nose  She has a positive rheumatoid factor. She experiences back and hip pain, which may be related to arthritis. She has a family history of autoimmune diseases, which may be relevant to her current symptoms.  Her blood pressure has been elevated, with a recent reading of 145/unknown. She is currently taking amlodipine  10 mg, chlorthalidone  25 mg(intermitt), and losartan  100mg  but has not been checking her blood pressure regularly at home. She drinks a lot of water but also consumes dark sodas, which might be affecting her symptoms.undergoing a lot of stress and will be changing jobs soon so she wants to wait and see if bps improve.  She experiences fatigue and difficulty sleeping, often waking up at 3 or 4 AM and unable to return to sleep. She previously had a sleep study and was diagnosed with sleep apnea but discontinued using the CPAP machine due to discomfort.   She takes Adderall for ADHD, which wears off by midday, and she sometimes takes a half dose later in the day, affecting her sleep.  She is taking iron  and B12 supplements for anemia,  with her hemoglobin levels showing some improvement. She is considering administering B12 injections herself to avoid frequent clinic visits since changing jobs to closer to home and this won't be convenient.  She reports a history of high blood pressure and swelling in her legs, which she attributes to her current medication regimen. She is currently working and has recently changed jobs to be closer to home. She has a daughter who is pregnant and another daughter trying to conceive, expressing a need to be closer to her family.    Health Maintenance Due  Topic Date Due   Colonoscopy  Never done    Past Medical History:  Diagnosis Date   ADD (attention deficit disorder)    Anxiety    Asthma    Blood transfusion without reported diagnosis    Depression    GERD (gastroesophageal reflux disease)    IBS (irritable bowel syndrome)    Insomnia    Lumbago    Migraine    Opioid dependence (HCC) 08/21/2022   Explained 08/21/22 that 4 months hydrocodone  for pain management for delays in dental care has resulted in hyperalgesia and dependence   Other chest pain 08/17/2022   Normal EKG and D-dimer  Associated with lifting on father Doesn't radiate  Nonexertional  Not substernal chest pain more to the side, feels related to sleeping on it wrong (reproducible)   Sciatica    Sleep apnea    Tietze's disease    Urinary incontinence 10/26/2022  Past Surgical History:  Procedure Laterality Date   APPENDECTOMY     CESAREAN SECTION     TONSILLECTOMY     TOTAL ABDOMINAL HYSTERECTOMY W/ BILATERAL SALPINGOOPHORECTOMY     TUBAL LIGATION       Current Outpatient Medications:    albuterol  (VENTOLIN  HFA) 108 (90 Base) MCG/ACT inhaler, Inhale 2 puffs into the lungs every 6 (six) hours as needed for wheezing or shortness of breath., Disp: 18 g, Rfl: 3   amLODipine  (NORVASC ) 10 MG tablet, Take 1 tablet (10 mg total) by mouth daily. Replaces amlodipine  5, for uncontrolled blood pressure., Disp: 90  tablet, Rfl: 3   amphetamine -dextroamphetamine  (ADDERALL XR) 20 MG 24 hr capsule, Take 1 capsule (20 mg total) by mouth daily., Disp: 30 capsule, Rfl: 0   Ascorbic Acid (VITAMIN C ) 500 MG CHEW, Chew 1 tablet (500 mg total) by mouth daily at 6 (six) AM. Take with methenamine , Disp: 90 tablet, Rfl: 3   budesonide -formoterol  (SYMBICORT ) 160-4.5 MCG/ACT inhaler, Inhale 2 puffs into the lungs 2 (two) times daily., Disp: 3 each, Rfl: 3   chlorthalidone  (HYGROTON ) 25 MG tablet, Take 1 tablet (25 mg total) by mouth daily., Disp: 30 tablet, Rfl: 1   Cranberry-D Mannose 158-500 MG CAPS, Take 1 Capful by mouth daily at 6 (six) AM., Disp: 90 capsule, Rfl: 3   Cyanocobalamin  (B-12) 1000 MCG SUBL, Place 1 tablet under the tongue daily at 6 (six) AM., Disp: 100 tablet, Rfl: 3   Erenumab -aooe (AIMOVIG ) 140 MG/ML SOAJ, Inject 140 mg into the skin every 30 (thirty) days., Disp: 1 mL, Rfl: 3   fluconazole  (DIFLUCAN ) 150 MG tablet, Take 1 tablet (150 mg total) by mouth every three (3) days as needed., Disp: 2 tablet, Rfl: 1   fluticasone  (FLONASE ) 50 MCG/ACT nasal spray, Place 1 spray into both nostrils daily., Disp: 48 g, Rfl: 3   gabapentin  (NEURONTIN ) 800 MG tablet, Take 1 tablet (800 mg total) by mouth 4 (four) times daily as needed. Replaces prior dosing schedule., Disp: 270 tablet, Rfl: 1   HYDROcodone -acetaminophen  (NORCO/VICODIN) 5-325 MG tablet, Take 2 tablets by mouth every 6 (six) hours as needed for moderate pain (pain score 4-6)., Disp: , Rfl:    iron  polysaccharides (NIFEREX) 150 MG capsule, Take 1 capsule (150 mg total) by mouth daily., Disp: 90 capsule, Rfl: 1   ketorolac  (TORADOL ) 10 MG tablet, Take 1 tablet (10 mg total) by mouth every 6 (six) hours as needed., Disp: 20 tablet, Rfl: 0   lidocaine  (HM LIDOCAINE  PATCH) 4 %, Place 1 patch onto the skin daily., Disp: 30 patch, Rfl: 1   methenamine  (HIPREX ) 1 g tablet, Take 1 tablet (1 g total) by mouth 2 (two) times daily with a meal., Disp: 68 tablet, Rfl:  3   mirtazapine  (REMERON ) 15 MG tablet, Take 1 tablet (15 mg total) by mouth at bedtime., Disp: 30 tablet, Rfl: 1   nystatin  (MYCOSTATIN ) 100000 UNIT/ML suspension, Take 5 mLs (500,000 Units total) by mouth 4 (four) times daily. Swish and hold in mouth few seconds and swallow, Disp: 60 mL, Rfl: 0   nystatin  (MYCOSTATIN /NYSTOP ) powder, Apply 1 Application topically 3 (three) times daily., Disp: 60 g, Rfl: 11   nystatin  cream (MYCOSTATIN ), Apply 1 Application topically 2 (two) times daily., Disp: 30 g, Rfl: 5   ondansetron  (ZOFRAN ) 4 MG tablet, Take 1 tablet (4 mg total) by mouth every 8 (eight) hours as needed for nausea or vomiting., Disp: 20 tablet, Rfl: 1   pantoprazole  (PROTONIX ) 40 MG  tablet, Take 1 tablet (40 mg total) by mouth 2 (two) times daily before a meal., Disp: 180 tablet, Rfl: 1   phenazopyridine  (PYRIDIUM ) 200 MG tablet, Take 1 tablet (200 mg total) by mouth 3 (three) times daily., Disp: 60 tablet, Rfl: 0   potassium chloride  (KLOR-CON  M) 20 MEQ tablet, Take 1 tablet (20 mEq total) by mouth daily., Disp: 90 tablet, Rfl: 0   Probiotic Product (PROBIOTIC DAILY) CAPS, Take 1 capsule by mouth daily., Disp: 30 capsule, Rfl: 2   promethazine  (PHENERGAN ) 25 MG tablet, Take 25 mg by mouth every 6 (six) hours as needed., Disp: , Rfl:    Rimegepant Sulfate (NURTEC) 75 MG TBDP, Take 1 tablet (75 mg total) by mouth once a week., Disp: 30 tablet, Rfl: 2   rizatriptan  (MAXALT ) 10 MG tablet, Take 1 tablet (10 mg total) by mouth as needed for migraine. May repeat in 2 hours if needed, Disp: 10 tablet, Rfl: 3   scopolamine  (TRANSDERM-SCOP) 1 MG/3DAYS, Place 1 patch (1.5 mg total) onto the skin every 3 (three) days., Disp: 10 patch, Rfl: 2   tiZANidine  (ZANAFLEX ) 4 MG tablet, Take 4 mg by mouth 3 (three) times daily., Disp: , Rfl:    valsartan  (DIOVAN ) 320 MG tablet, Take 1 tablet (320 mg total) by mouth daily., Disp: 90 tablet, Rfl: 3   Estradiol  10 MCG TABS vaginal tablet, Place 1 tablet (10 mcg  total) vaginally at bedtime for 14 days, THEN 1 tablet (10 mcg total) 2 (two) times a week. Insert one tablet in the vagina twice a week.. (Patient not taking: Reported on 11/05/2023), Disp: 24 tablet, Rfl: 4   methocarbamol  1000 MG TABS, Take 1,000 mg by mouth 2 (two) times daily as needed for muscle spasms., Disp: 180 tablet, Rfl: 1  Allergies  Allergen Reactions   Cefazolin Hives    Only IV formulation can safely take by mouth keflex    Ondansetron  Hives   Other Hives    Odansetron injection.   Sulfa Antibiotics Rash and Dermatitis   Tramadol Hcl     Other reaction(s): chest pain   Amitriptyline      hallucination   Galcanezumab-Gnlm Other (See Comments)    galcanezumab-gnlm   Ultram [Tramadol] Other (See Comments)    Tachycardia   Nitrofurantoin  Itching    Red welts.  nitrofurantoin    Oxycodone  Rash and Dermatitis    Only when taking multiple doses   Sulfasalazine Rash and Dermatitis   ROS neg/noncontributory except as noted HPI/below      Objective:      BP (!) 144/98 (BP Location: Left Arm, Cuff Size: Normal)   Pulse 100   Temp 98.2 F (36.8 C) (Temporal)   Resp 18   Ht 4\' 11"  (1.499 m)   Wt 136 lb 4 oz (61.8 kg)   SpO2 96%   BMI 27.52 kg/m  Wt Readings from Last 3 Encounters:  11/05/23 136 lb 4 oz (61.8 kg)  10/27/23 131 lb (59.4 kg)  09/28/23 138 lb 1.6 oz (62.6 kg)    Physical Exam   Gen: WDWN NAD HEENT: NCAT, conjunctiva not injected, sclera nonicteric NECK:  supple, no thyromegaly, no nodes, no carotid bruits CARDIAC: RRR, S1S2+, no murmur. DP 2+B LUNGS: CTAB. No wheezes ABDOMEN:  BS+, soft, NTND, No HSM, no masses EXT:  1+ edema MSK: no gross abnormalities.  NEURO: A&O x3.  CN II-XII intact.  PSYCH: normal mood. Good eye contact     Assessment & Plan:  Essential hypertension -  Valsartan ; Take 1 tablet (320 mg total) by mouth daily.  Dispense: 90 tablet; Refill: 3  Attention deficit hyperactivity disorder (ADHD), combined type -      Amphetamine -Dextroamphet ER; Take 1 capsule (20 mg total) by mouth daily.  Dispense: 30 capsule; Refill: 0  Rash -     ANA; Future  B12 deficiency -     Cyanocobalamin   Primary insomnia -     Mirtazapine ; Take 1 tablet (15 mg total) by mouth at bedtime.  Dispense: 30 tablet; Refill: 1  Chronic bilateral low back pain with right-sided sciatica -     Methocarbamol ; Take 1,000 mg by mouth 2 (two) times daily as needed for muscle spasms.  Dispense: 180 tablet; Refill: 1  Recurrent UTI -     Urinalysis, Complete -     Urine Culture  Assessment and Plan Assessment & Plan Hypertension   Her hypertension remains poorly controlled, with recent elevated readings. She currently takes amlodipine , chlorthalidone , and losartan . Amlodipine  may be causing leg swelling. She does not regularly monitor her blood pressure at home due to a busy schedule, and stress from work and personal life may be contributing to the elevated readings. Discussed changing losartan  to valsartan . Decrease amlodipine  dosage once blood pressure is better controlled. Advise wearing compression stockings for leg swelling. Encourage regular home blood pressure monitoring. Pt wants to wait on making any changes until starts new job and monitoring more.  Sleep apnea   She is not using a CPAP machine and reports difficulty sleeping and fatigue. She previously had a sleep study and discontinued CPAP use due to discomfort. Discuss a new sleep study and obtaining a new CPAP machine. Evaluate for other causes of sleep disturbances-like adderall-so will change to long acting.  Fatigue   Persistent fatigue may be related to sleep disturbances, stress, or medication side effects. Stress from work and personal life may contribute to fatigue. Consider evaluation for sleep apnea and other sleep disturbances. Discuss potential medication adjustments for fatigue. Consider trial of mirtazapine  for sleep.  Dry mouth and eyes   Persistent dry  mouth and eyes may be related to Sjogren's syndrome. She is awaiting further testing and a rheumatology referral. She uses rewetting drops for dry eyes and has tried Biotene for dry mouth without relief. Await Sjogren's syndrome testing results. Encourage continued use of rewetting drops for dry eyes. Advise on potential rheumatology referral for further evaluation.  Facial rash-intermitt.  Will add ANA to labs to r/o Lupus.  May be rosacea  Rheumatoid arthritis   She has a positive rheumatoid factor without definitive symptoms and reports back and hip pain, possibly related to arthritis. Awaiting further testing and rheumatology referral. Await further testing results for rheumatoid arthritis. Advise on potential rheumatology referral for further evaluation. Pt will call w/rheum who takes medicaid closer to [facility] area  ADHD-currently on adderall 20mg  bid but pt having imsomnia and wonders if from that.  Will change to adderall xr 20mg  to see if better control and not interfere w/sleep.   B12 deficiency-on injections.  Since changing jobs, coming here inconvenient and wants to do self injections-will send rx.     Return in about 4 weeks (around 12/03/2023) for chronic follow-up-virtual.  Ellsworth Haas, MD

## 2023-11-05 NOTE — Patient Instructions (Addendum)
 Compression stockings  Stop losartan  and start valsartan   Change to adderall long acting

## 2023-11-06 ENCOUNTER — Ambulatory Visit: Payer: Self-pay | Admitting: Internal Medicine

## 2023-11-06 NOTE — Progress Notes (Signed)
 Although there are trace of leukocytes there is also squamous epithelial cells which is considered a marker for contaminated urine and there is only a few bacteria much better than prior urinalyses so I believe this is likely a negative urinalysis that has been very slightly contaminated with an imperfect clean-catch technique if there are symptoms I recommend repeating and focusing on technique

## 2023-11-07 LAB — URINALYSIS, COMPLETE
Bilirubin Urine: NEGATIVE
Glucose, UA: NEGATIVE
Hgb urine dipstick: NEGATIVE
Hyaline Cast: NONE SEEN /LPF
Ketones, ur: NEGATIVE
Nitrite: NEGATIVE
Protein, ur: NEGATIVE
RBC / HPF: NONE SEEN /HPF (ref 0–2)
Specific Gravity, Urine: 1.022 (ref 1.001–1.035)
pH: 5.5 (ref 5.0–8.0)

## 2023-11-07 LAB — URINE CULTURE
MICRO NUMBER:: 16519955
SPECIMEN QUALITY:: ADEQUATE

## 2023-11-07 MED ORDER — SYRINGE 25G X 1" 3 ML MISC: 0 refills | Status: AC

## 2023-11-07 MED ORDER — CYANOCOBALAMIN 1000 MCG/ML IJ SOLN: 1000.0000 ug | INTRAMUSCULAR | 3 refills | Status: DC

## 2023-11-08 ENCOUNTER — Ambulatory Visit: Payer: Self-pay | Admitting: Family Medicine

## 2023-11-08 NOTE — Progress Notes (Signed)
 Discussed w/pt at appt on 5/30.  Sjogren is negative but still needs to see the rheum-she will let us  know who she wants referral to

## 2023-11-10 ENCOUNTER — Telehealth: Payer: Self-pay

## 2023-11-10 DIAGNOSIS — I10 Essential (primary) hypertension: Secondary | ICD-10-CM

## 2023-11-10 LAB — TEST AUTHORIZATION

## 2023-11-10 LAB — ANA: Anti Nuclear Antibody (ANA): NEGATIVE

## 2023-11-10 LAB — SJOGRENS SYNDROME-B EXTRACTABLE NUCLEAR ANTIBODY: SSB (La) (ENA) Antibody, IgG: <1 AI

## 2023-11-10 LAB — CYCLIC CITRUL PEPTIDE ANTIBODY, IGG: Cyclic Citrullin Peptide Ab: <16 U

## 2023-11-10 LAB — RHEUMATOID FACTOR: Rheumatoid fact SerPl-aCnc: 17 [IU]/mL — ABNORMAL HIGH (ref <14)

## 2023-11-10 LAB — SJOGRENS SYNDROME-A EXTRACTABLE NUCLEAR ANTIBODY: SSA (Ro) (ENA) Antibody, IgG: <1 AI

## 2023-11-10 NOTE — Progress Notes (Signed)
 Ana negative.  Have you found a rheumatologist?

## 2023-11-11 ENCOUNTER — Telehealth: Payer: Self-pay | Admitting: *Deleted

## 2023-11-11 NOTE — Progress Notes (Signed)
 Complex Care Management Note  Care Guide Note 11/11/2023 Name: Laurie Roman MRN: [id number] DOB: 1976/02/07  Laurie Roman is a 48 y.o. year old female who sees Christel Cousins, MD for primary care. I reached out to Adaria G Novosad by phone today to offer complex care management services.  [name] was given information about Complex Care Management services today including:   The Complex Care Management services include support from the care team which includes your Nurse Care Manager, Clinical Social Worker, or Pharmacist.  The Complex Care Management team is here to help remove barriers to the health concerns and goals most important to you. Complex Care Management services are voluntary, and the patient may decline or stop services at any time by request to their care team member.   Complex Care Management Consent Status: Patient did not agree to participate in complex care management services at this time.  Follow up plan:  pt declined services at this time   Encounter Outcome:  Patient Refused.  Kandis Ormond, CMA [place]  [facility], [facility] Guide Direct Dial: [phone]  Fax: [phone] Website: [email or web address]

## 2023-11-20 ENCOUNTER — Encounter: Payer: Self-pay | Admitting: Internal Medicine

## 2023-11-20 ENCOUNTER — Encounter: Payer: Self-pay | Admitting: Family Medicine

## 2023-11-22 ENCOUNTER — Other Ambulatory Visit: Payer: Self-pay

## 2023-11-22 MED ORDER — FUROSEMIDE 20 MG PO TABS: 20.0000 mg | ORAL_TABLET | Freq: Every day | ORAL | 0 refills | Status: DC

## 2023-11-23 ENCOUNTER — Encounter: Payer: Self-pay | Admitting: Family Medicine

## 2023-11-23 ENCOUNTER — Other Ambulatory Visit: Payer: Self-pay | Admitting: Family Medicine

## 2023-11-23 ENCOUNTER — Other Ambulatory Visit: Payer: Self-pay | Admitting: Nurse Practitioner

## 2023-11-23 MED ORDER — FUROSEMIDE 20 MG PO TABS: 20.0000 mg | ORAL_TABLET | Freq: Every day | ORAL | 0 refills | Status: DC

## 2023-11-23 MED ORDER — POTASSIUM CHLORIDE CRYS ER 20 MEQ PO TBCR: 20.0000 meq | EXTENDED_RELEASE_TABLET | Freq: Every day | ORAL | 0 refills | Status: DC

## 2023-11-23 NOTE — Progress Notes (Signed)
 Bp 141/92.  No sob/cp.  Drinking more water.  Just the edema.  Needs lasix sent to [facility] court now(done).   Decrease amlodipine  to 1/2 tab Monitor bp's   not so bad in the am.  Elevate legs  Keep us  posted  May need spironolactone

## 2023-11-24 ENCOUNTER — Other Ambulatory Visit: Payer: Self-pay | Admitting: Family Medicine

## 2023-11-24 DIAGNOSIS — F902 Attention-deficit hyperactivity disorder, combined type: Secondary | ICD-10-CM

## 2023-11-24 MED ORDER — AMPHETAMINE-DEXTROAMPHET ER 20 MG PO CP24: 20.0000 mg | ORAL_CAPSULE | Freq: Every day | ORAL | 0 refills | Status: DC

## 2023-11-25 ENCOUNTER — Other Ambulatory Visit (HOSPITAL_COMMUNITY): Payer: Self-pay

## 2023-11-25 ENCOUNTER — Telehealth: Payer: Self-pay

## 2023-11-25 NOTE — Telephone Encounter (Signed)
 Noted

## 2023-11-25 NOTE — Telephone Encounter (Signed)
 Pharmacy Patient Advocate Encounter  Received notification from [employer] that Prior Authorization for Aimovig  140MG /ML auto-injectors has been APPROVED from 11/25/23 to 11/24/24   PA #/Case ID/Reference #: [id number]

## 2023-11-25 NOTE — Telephone Encounter (Signed)
 Pharmacy Patient Advocate Encounter   Received notification from Onbase that prior authorization for Aimovig  140MG /ML auto-injectors is required/requested.   Insurance verification completed.   The patient is insured through [facility] .   Per test claim: PA required; PA submitted to above mentioned insurance via CoverMyMeds Key/confirmation #/EOC [id number] Status is pending

## 2023-11-26 ENCOUNTER — Other Ambulatory Visit: Payer: Self-pay | Admitting: *Deleted

## 2023-11-26 ENCOUNTER — Encounter: Payer: Self-pay | Admitting: Family Medicine

## 2023-11-26 MED ORDER — AIMOVIG 140 MG/ML ~~LOC~~ SOAJ: 140.0000 mg | SUBCUTANEOUS | 3 refills | Status: DC

## 2023-11-27 DIAGNOSIS — Z79899 Other long term (current) drug therapy: Secondary | ICD-10-CM | POA: Diagnosis not present

## 2023-12-06 ENCOUNTER — Other Ambulatory Visit: Payer: Self-pay | Admitting: Family Medicine

## 2023-12-06 DIAGNOSIS — F5101 Primary insomnia: Secondary | ICD-10-CM

## 2023-12-07 ENCOUNTER — Encounter: Payer: Self-pay | Admitting: *Deleted

## 2023-12-07 NOTE — Telephone Encounter (Signed)
 Waiting for patient to let me know which pharmacy.

## 2023-12-08 ENCOUNTER — Other Ambulatory Visit: Payer: Self-pay | Admitting: *Deleted

## 2023-12-08 DIAGNOSIS — F5101 Primary insomnia: Secondary | ICD-10-CM

## 2023-12-08 MED ORDER — MIRTAZAPINE 15 MG PO TABS: 15.0000 mg | ORAL_TABLET | Freq: Every day | ORAL | 1 refills | Status: DC

## 2023-12-22 ENCOUNTER — Encounter: Payer: Self-pay | Admitting: Family Medicine

## 2023-12-23 ENCOUNTER — Other Ambulatory Visit: Payer: Self-pay | Admitting: Family Medicine

## 2023-12-23 MED ORDER — AMPHETAMINE-DEXTROAMPHETAMINE 20 MG PO TABS: 20.0000 mg | ORAL_TABLET | Freq: Every day | ORAL | 0 refills | Status: DC

## 2023-12-25 DIAGNOSIS — Z79899 Other long term (current) drug therapy: Secondary | ICD-10-CM | POA: Diagnosis not present

## 2023-12-30 ENCOUNTER — Encounter: Payer: Self-pay | Admitting: Family Medicine

## 2024-01-04 ENCOUNTER — Other Ambulatory Visit: Payer: Self-pay | Admitting: Family Medicine

## 2024-01-04 DIAGNOSIS — G8929 Other chronic pain: Secondary | ICD-10-CM

## 2024-01-04 MED ORDER — AMPHETAMINE-DEXTROAMPHETAMINE 20 MG PO TABS: 20.0000 mg | ORAL_TABLET | Freq: Two times a day (BID) | ORAL | 0 refills | Status: DC

## 2024-01-04 MED ORDER — TIZANIDINE HCL 4 MG PO TABS: 4.0000 mg | ORAL_TABLET | Freq: Three times a day (TID) | ORAL | 5 refills | Status: DC

## 2024-01-10 ENCOUNTER — Telehealth: Payer: Self-pay | Admitting: *Deleted

## 2024-01-10 NOTE — Telephone Encounter (Signed)
 Copied from CRM [id number]. Topic: Clinical - Prescription Issue >> Jan 10, 2024  1:29 PM Frederich PARAS wrote: Reason for CRM: pt had a prescription  transferred over and she is getting 2 diff types of muscle relaxers, and they are wanting to know which ones she is suppost to be taking, and if she is suppost  to be taking both of them then the directions need to be clearer .The medications are  Methocarbamal 5ooMG and TZANIDINE 4MG   Melanie from [facility]  call back # is [phone]

## 2024-01-11 MED ORDER — TIZANIDINE HCL 4 MG PO TABS: 4.0000 mg | ORAL_TABLET | Freq: Every evening | ORAL | 5 refills | Status: DC | PRN

## 2024-01-11 NOTE — Telephone Encounter (Signed)
 Rx's updated, spoke to pharmacy to confirm.

## 2024-01-11 NOTE — Addendum Note (Signed)
 Addended by: JOSHUA JOEN RAMAN on: 01/11/2024 08:04 AM   Modules accepted: Orders

## 2024-01-20 DIAGNOSIS — Z79899 Other long term (current) drug therapy: Secondary | ICD-10-CM | POA: Diagnosis not present

## 2024-02-14 ENCOUNTER — Other Ambulatory Visit: Payer: Self-pay | Admitting: Family Medicine

## 2024-02-14 ENCOUNTER — Encounter: Payer: Self-pay | Admitting: Family Medicine

## 2024-02-14 DIAGNOSIS — R2681 Unsteadiness on feet: Secondary | ICD-10-CM

## 2024-02-14 DIAGNOSIS — M5116 Intervertebral disc disorders with radiculopathy, lumbar region: Secondary | ICD-10-CM

## 2024-02-14 DIAGNOSIS — F11288 Opioid dependence with other opioid-induced disorder: Secondary | ICD-10-CM

## 2024-02-14 DIAGNOSIS — S3992XA Unspecified injury of lower back, initial encounter: Secondary | ICD-10-CM

## 2024-02-14 DIAGNOSIS — F5101 Primary insomnia: Secondary | ICD-10-CM

## 2024-02-14 DIAGNOSIS — E538 Deficiency of other specified B group vitamins: Secondary | ICD-10-CM

## 2024-02-14 DIAGNOSIS — R202 Paresthesia of skin: Secondary | ICD-10-CM

## 2024-02-14 DIAGNOSIS — M5416 Radiculopathy, lumbar region: Secondary | ICD-10-CM

## 2024-02-14 DIAGNOSIS — I10 Essential (primary) hypertension: Secondary | ICD-10-CM

## 2024-02-14 DIAGNOSIS — B3731 Acute candidiasis of vulva and vagina: Secondary | ICD-10-CM

## 2024-02-14 DIAGNOSIS — M5432 Sciatica, left side: Secondary | ICD-10-CM

## 2024-02-14 DIAGNOSIS — J4521 Mild intermittent asthma with (acute) exacerbation: Secondary | ICD-10-CM

## 2024-02-14 DIAGNOSIS — S79911A Unspecified injury of right hip, initial encounter: Secondary | ICD-10-CM

## 2024-02-14 MED ORDER — VALSARTAN 320 MG PO TABS: 320.0000 mg | ORAL_TABLET | Freq: Every day | ORAL | 0 refills | Status: DC

## 2024-02-14 MED ORDER — MIRTAZAPINE 15 MG PO TABS: 15.0000 mg | ORAL_TABLET | Freq: Every day | ORAL | 1 refills | Status: DC

## 2024-02-14 MED ORDER — AMPHETAMINE-DEXTROAMPHETAMINE 20 MG PO TABS: 20.0000 mg | ORAL_TABLET | Freq: Two times a day (BID) | ORAL | 0 refills | Status: DC

## 2024-02-14 MED ORDER — B-12 1000 MCG SL SUBL: 1.0000 | SUBLINGUAL_TABLET | Freq: Every day | SUBLINGUAL | 3 refills | Status: DC

## 2024-02-14 MED ORDER — ALBUTEROL SULFATE HFA 108 (90 BASE) MCG/ACT IN AERS
2.0000 | INHALATION_SPRAY | Freq: Four times a day (QID) | RESPIRATORY_TRACT | 1 refills | Status: AC | PRN
Start: 2024-02-14 — End: ?

## 2024-02-14 MED ORDER — GABAPENTIN 800 MG PO TABS: 800.0000 mg | ORAL_TABLET | Freq: Four times a day (QID) | ORAL | 1 refills | Status: DC | PRN

## 2024-02-14 MED ORDER — AIMOVIG 140 MG/ML ~~LOC~~ SOAJ: 140.0000 mg | SUBCUTANEOUS | 3 refills | Status: DC

## 2024-02-14 MED ORDER — FLUCONAZOLE 150 MG PO TABS: 150.0000 mg | ORAL_TABLET | ORAL | 1 refills | Status: DC | PRN

## 2024-02-15 ENCOUNTER — Other Ambulatory Visit: Payer: Self-pay | Admitting: Family Medicine

## 2024-02-15 ENCOUNTER — Encounter: Payer: Self-pay | Admitting: Family Medicine

## 2024-02-15 DIAGNOSIS — G8929 Other chronic pain: Secondary | ICD-10-CM

## 2024-02-15 MED ORDER — METHOCARBAMOL 1000 MG PO TABS: 1000.0000 mg | ORAL_TABLET | Freq: Two times a day (BID) | ORAL | 0 refills | Status: DC | PRN

## 2024-02-18 ENCOUNTER — Telehealth: Payer: Self-pay

## 2024-02-18 DIAGNOSIS — M7631 Iliotibial band syndrome, right leg: Secondary | ICD-10-CM

## 2024-02-18 NOTE — Progress Notes (Signed)
 Complex Care Management Note Care Guide Note  02/18/2024 Name: Laurie Roman MRN: [id number] DOB: 06-03-1976   Complex Care Management Outreach Attempts: An unsuccessful telephone outreach was attempted today to offer the patient information about available complex care management services.  Follow Up Plan:  Additional outreach attempts will be made to offer the patient complex care management information and services.   Encounter Outcome:  No Answer-Left voicemail  Leotis Rase [facility], [facility] Guide  Direct Dial: [phone]  Fax [phone]

## 2024-02-19 DIAGNOSIS — M7631 Iliotibial band syndrome, right leg: Secondary | ICD-10-CM | POA: Diagnosis not present

## 2024-02-19 DIAGNOSIS — I10 Essential (primary) hypertension: Secondary | ICD-10-CM | POA: Diagnosis not present

## 2024-02-21 ENCOUNTER — Other Ambulatory Visit: Payer: Self-pay

## 2024-02-21 DIAGNOSIS — Z79899 Other long term (current) drug therapy: Secondary | ICD-10-CM | POA: Diagnosis not present

## 2024-02-21 DIAGNOSIS — K219 Gastro-esophageal reflux disease without esophagitis: Secondary | ICD-10-CM

## 2024-02-21 MED ORDER — PANTOPRAZOLE SODIUM 40 MG PO TBEC: 40.0000 mg | DELAYED_RELEASE_TABLET | Freq: Two times a day (BID) | ORAL | 1 refills | Status: AC

## 2024-02-25 ENCOUNTER — Telehealth: Payer: Self-pay

## 2024-02-25 ENCOUNTER — Other Ambulatory Visit (HOSPITAL_COMMUNITY): Payer: Self-pay

## 2024-02-25 NOTE — Telephone Encounter (Signed)
 Pharmacy Patient Advocate Encounter   Received notification from Onbase that prior authorization for tiZANidine  HCl 4MG  tablets is required/requested.   Insurance verification completed.   The patient is insured through [facility] .   Per test claim: PA required; PA submitted to above mentioned insurance via Latent Key/confirmation #/EOC [id number] Status is pending

## 2024-02-25 NOTE — Progress Notes (Signed)
 Complex Care Management Note Care Guide Note  02/25/2024 Name: Laurie Roman MRN: [id number] DOB: May 07, 1976   Complex Care Management Outreach Attempts: A second unsuccessful outreach was attempted today to offer the patient with information about available complex care management services.  Follow Up Plan:  Additional outreach attempts will be made to offer the patient complex care management information and services.   Encounter Outcome:  No Answer-Left voicemail  Leotis Rase [facility], [facility] Guide  Direct Dial: [phone]  Fax [phone]

## 2024-02-28 ENCOUNTER — Telehealth (INDEPENDENT_AMBULATORY_CARE_PROVIDER_SITE_OTHER): Admitting: Family Medicine

## 2024-02-28 ENCOUNTER — Encounter: Payer: Self-pay | Admitting: Family Medicine

## 2024-02-28 DIAGNOSIS — J4530 Mild persistent asthma, uncomplicated: Secondary | ICD-10-CM

## 2024-02-28 DIAGNOSIS — M5441 Lumbago with sciatica, right side: Secondary | ICD-10-CM

## 2024-02-28 DIAGNOSIS — I10 Essential (primary) hypertension: Secondary | ICD-10-CM

## 2024-02-28 DIAGNOSIS — M5416 Radiculopathy, lumbar region: Secondary | ICD-10-CM | POA: Diagnosis not present

## 2024-02-28 DIAGNOSIS — F902 Attention-deficit hyperactivity disorder, combined type: Secondary | ICD-10-CM | POA: Diagnosis not present

## 2024-02-28 DIAGNOSIS — G8929 Other chronic pain: Secondary | ICD-10-CM

## 2024-02-28 DIAGNOSIS — G43909 Migraine, unspecified, not intractable, without status migrainosus: Secondary | ICD-10-CM

## 2024-02-28 DIAGNOSIS — K219 Gastro-esophageal reflux disease without esophagitis: Secondary | ICD-10-CM

## 2024-02-28 NOTE — Progress Notes (Signed)
 MyChart Video Visit Virtual Visit via Video Note   This visit type was conducted w/patient consent. This format is felt to be most appropriate for this patient at this time. Physical exam was limited by quality of the video and audio technology used for the visit. CMA was able to get the patient set up on a video visit.  Patient location: work Patient and provider in visit Provider location: Office  I discussed the limitations of evaluation and management by telemedicine and the availability of in person appointments. The patient expressed understanding and agreed to proceed.  Visit Date: 02/28/2024  Today's healthcare provider: Jenkins Laurie Carrel, MD     Subjective:    Patient ID: Laurie Roman, female    DOB: [birth year], 48 y.o.   MRN: [id number]  Chief Complaint  Patient presents with   Leg Pain    Bilateral leg/hip pain, requesting MRI   Medication Problem    Discuss Zanaflex  denial    HPI 147/92-amlod 5, chlor 25.  Valsartan  320 Discussed the use of AI scribe software for clinical note transcription with the patient, who gave verbal consent to proceed.  History of Present Illness Laurie Roman is a 48 year old female with hypertension and chronic pain who presents for medication management and follow-up.  She has been experiencing elevated blood pressure readings, particularly during periods of pain or stress, with recent measurements of 147/92 mmHg and 167/100 mmHg. Her usual readings are in the 150s/90s range. She is currently on amlodipine  5 mg, valsartan  320 mg, and chlorthalidone  25mg .  No cp/sob.  Still intermitt palp when stressed.   She experiences chronic pain, especially in her legs and back, described as a burning sensation in the front of her thighs down to her knees(which is new), worsening at night, and present for about a month. She has a history of sciatic nerve pain and has been hospitalized for this issue, but no imaging was performed due to weekend staffing  limitations. Her pain management includes gabapentin  800 mg three times a day, hydrocodone , and tizanidine  for sleep, methocarbamol -for daytime use. .  She has asthma, managed with Symbicort  twice daily and albuterol  as a rescue inhaler once or twice a week. She has reduced her vaping due to work restrictions, which has decreased her stress levels.  She suffers from migraines and uses Aimovig  monthly and Maxalt  as needed, though she sometimes goes without Aimovig  due to pharmacy supply issues.  For gastrointestinal issues, she takes pantoprazole  twice daily.  She also takes Adderall twice daily for attention deficit disorder and reports no significant side effects, though her pulse is slightly elevated in the mornings. No SI  She has stopped taking mirtazapine  for sleep, opting instead for over-the-counter sleep aids. She reports difficulty staying asleep, often waking up early due to stress and racing thoughts.  She mentions that her Medicaid coverage may be ending soon, which could affect her medication access. She is concerned about the impact of this on her ability to manage her health conditions.  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 She is also dealing with personal stressors, including her daughters' Medicaid status and her grandchild's upcoming birth.    Past Medical History:  Diagnosis Date   ADD (attention deficit disorder)    Anxiety    Asthma    Blood transfusion without reported diagnosis    Depression    GERD (gastroesophageal  reflux disease)    IBS (irritable bowel syndrome)    Insomnia    Lumbago    Migraine    Opioid dependence (HCC) 08/21/2022   Explained 08/21/22 that 4 months hydrocodone  for pain management for delays in dental care has resulted in hyperalgesia and dependence   Other chest pain 08/17/2022   Normal EKG and D-dimer  Associated with lifting on father  Doesn't radiate  Nonexertional  Not substernal chest pain more to the side, feels related to sleeping on it wrong (reproducible)   Sciatica    Sleep apnea    Tietze's disease    Urinary incontinence 10/26/2022    Past Surgical History:  Procedure Laterality Date   APPENDECTOMY     CESAREAN SECTION     TONSILLECTOMY     TOTAL ABDOMINAL HYSTERECTOMY W/ BILATERAL SALPINGOOPHORECTOMY     TUBAL LIGATION      Outpatient Medications Prior to Visit  Medication Sig Dispense Refill   albuterol  (VENTOLIN  HFA) 108 (90 Base) MCG/ACT inhaler Inhale 2 puffs into the lungs every 6 (six) hours as needed for wheezing or shortness of breath. 18 g 1   amLODipine  (NORVASC ) 10 MG tablet Take 1 tablet (10 mg total) by mouth daily. Replaces amlodipine  5, for uncontrolled blood pressure. 90 tablet 3   amphetamine -dextroamphetamine  (ADDERALL) 20 MG tablet Take 1 tablet (20 mg total) by mouth 2 (two) times daily. 60 tablet 0   budesonide -formoterol  (SYMBICORT ) 160-4.5 MCG/ACT inhaler Inhale 2 puffs into the lungs 2 (two) times daily. 10.2 g 3   chlorthalidone  (HYGROTON ) 25 MG tablet Take 1 tablet (25 mg total) by mouth daily. 30 tablet 1   cyanocobalamin  (VITAMIN B12) 1000 MCG tablet Take 1,000 mcg by mouth daily.     diphenhydrAMINE -APAP, sleep, (GOODY PM PO) Take by mouth.     Erenumab -aooe (AIMOVIG ) 140 MG/ML SOAJ Inject 140 mg into the skin every 30 (thirty) days. 1 mL 3   fluticasone  (FLONASE ) 50 MCG/ACT nasal spray Place 1 spray into both nostrils daily. 48 g 3   gabapentin  (NEURONTIN ) 800 MG tablet Take 1 tablet (800 mg total) by mouth 4 (four) times daily as needed. Replaces prior dosing schedule. 270 tablet 1   HYDROcodone -acetaminophen  (NORCO/VICODIN) 5-325 MG tablet Take 2 tablets by mouth every 6 (six) hours as needed for moderate pain (pain score 4-6).     Methocarbamol  1000 MG TABS Take 1,000 mg by mouth 2 (two) times daily as needed. 180 tablet 0   ondansetron  (ZOFRAN ) 4 MG tablet Take 1 tablet (4  mg total) by mouth every 8 (eight) hours as needed for nausea or vomiting. 20 tablet 1   pantoprazole  (PROTONIX ) 40 MG tablet Take 1 tablet (40 mg total) by mouth 2 (two) times daily before a meal. 180 tablet 1   promethazine  (PHENERGAN ) 25 MG tablet Take 25 mg by mouth every 6 (six) hours as needed.     rizatriptan  (MAXALT ) 10 MG tablet Take 1 tablet (10 mg total) by mouth as needed for migraine. May repeat in 2 hours if needed 10 tablet 3   Syringe/Needle, Disp, (SYRINGE 3CC/25GX1) 25G X 1 3 ML MISC Use to give IM B12 injection monthly 12 each 0   tiZANidine  (ZANAFLEX ) 4 MG tablet Take 1 tablet (4 mg total) by mouth at bedtime as needed for muscle spasms. 30 tablet 5   valsartan  (DIOVAN ) 320 MG tablet Take 1 tablet (320 mg total) by mouth daily. 90 tablet 0   mirtazapine  (REMERON ) 15 MG tablet Take 1  tablet (15 mg total) by mouth at bedtime. 30 tablet 1   Ascorbic Acid (VITAMIN C ) 500 MG CHEW Chew 1 tablet (500 mg total) by mouth daily at 6 (six) AM. Take with methenamine  90 tablet 3   Cranberry-D Mannose 158-500 MG CAPS Take 1 Capful by mouth daily at 6 (six) AM. 90 capsule 3   Cyanocobalamin  (B-12) 1000 MCG SUBL Place 1 tablet under the tongue daily at 6 (six) AM. 100 tablet 3   cyanocobalamin  (VITAMIN B12) 1000 MCG/ML injection Inject 1 mL (1,000 mcg total) into the muscle every 30 (thirty) days. 3 mL 3   fluconazole  (DIFLUCAN ) 150 MG tablet Take 1 tablet (150 mg total) by mouth every three (3) days as needed. 2 tablet 1   furosemide  (LASIX ) 20 MG tablet Take 1 tablet (20 mg total) by mouth daily. 3 tablet 0   iron  polysaccharides (NIFEREX) 150 MG capsule Take 1 capsule (150 mg total) by mouth daily. 90 capsule 1   ketorolac  (TORADOL ) 10 MG tablet Take 1 tablet (10 mg total) by mouth every 6 (six) hours as needed. 20 tablet 0   lidocaine  (HM LIDOCAINE  PATCH) 4 % Place 1 patch onto the skin daily. 30 patch 1   methenamine  (HIPREX ) 1 g tablet Take 1 tablet (1 g total) by mouth 2 (two) times  daily with a meal. 68 tablet 3   nystatin  (MYCOSTATIN ) 100000 UNIT/ML suspension Take 5 mLs (500,000 Units total) by mouth 4 (four) times daily. Swish and hold in mouth few seconds and swallow 60 mL 0   nystatin  (MYCOSTATIN /NYSTOP ) powder Apply 1 Application topically 3 (three) times daily. 60 g 11   nystatin  cream (MYCOSTATIN ) Apply 1 Application topically 2 (two) times daily. 30 g 5   phenazopyridine  (PYRIDIUM ) 200 MG tablet Take 1 tablet (200 mg total) by mouth 3 (three) times daily. 60 tablet 0   potassium chloride  SA (KLOR-CON  M) 20 MEQ tablet Take 1 tablet (20 mEq total) by mouth daily. 90 tablet 0   Probiotic Product (PROBIOTIC DAILY) CAPS Take 1 capsule by mouth daily. 30 capsule 2   Rimegepant Sulfate (NURTEC) 75 MG TBDP Take 1 tablet (75 mg total) by mouth once a week. 30 tablet 2   scopolamine  (TRANSDERM-SCOP) 1 MG/3DAYS Place 1 patch (1.5 mg total) onto the skin every 3 (three) days. 10 patch 2   No facility-administered medications prior to visit.    Allergies  Allergen Reactions   Cefazolin Hives    Only IV formulation can safely take by mouth keflex    Ondansetron  Hives   Other Hives    Odansetron injection.   Sulfa Antibiotics Rash and Dermatitis   Tramadol Hcl     Other reaction(s): chest pain   Amitriptyline      hallucination   Galcanezumab-Gnlm Other (See Comments)    galcanezumab-gnlm   Ultram [Tramadol] Other (See Comments)    Tachycardia   Nitrofurantoin  Itching    Red welts.  nitrofurantoin    Oxycodone  Rash and Dermatitis    Only when taking multiple doses   Sulfasalazine Rash and Dermatitis        Objective:     Physical Exam  Vitals and nursing note reviewed.  Constitutional:      General:  is not in acute distress.    Appearance: Normal appearance.  HENT:     Head: Normocephalic.  Pulmonary:     Effort: No respiratory distress.  Skin:    General: Skin is dry.     Coloration: Skin is not  pale.  Neurological:     Mental Status: Pt is  alert and oriented to person, place, and time.  Psychiatric:        Mood and Affect: Mood normal.   There were no vitals taken for this visit.  Wt Readings from Last 3 Encounters:  11/05/23 136 lb 4 oz (61.8 kg)  10/27/23 131 lb (59.4 kg)  09/28/23 138 lb 1.6 oz (62.6 kg)       Assessment & Plan:   Problem List Items Addressed This Visit     ADHD   Asthma   Chronic bilateral low back pain with sciatica   Relevant Medications   diphenhydrAMINE -APAP, sleep, (GOODY PM PO)   Other Relevant Orders   MR LUMBAR SPINE WO CONTRAST   Essential hypertension - Primary   Gastroesophageal reflux disease without esophagitis   Migraine   Other Visit Diagnoses       Lumbar radiculopathy       Relevant Medications   diphenhydrAMINE -APAP, sleep, (GOODY PM PO)   Other Relevant Orders   MR LUMBAR SPINE WO CONTRAST     mri  No orders of the defined types were placed in this encounter. Assessment and Plan Assessment & Plan Chronic pain syndrome with chronic low back pain and bilateral leg pain   She experiences chronic low back pain with new bilateral anterior thigh pain, likely due to nerve impingement. The pain is severe, especially at night, and has persisted for about a month. Previous physical therapy was interrupted due to worker's compensation issues, and the pain management clinic is not addressing the underlying cause. Order an MRI of the lumbar spine to evaluate for nerve impingement. Consider resuming physical therapy if the MRI indicates benefit.  Hypertension   Blood pressure remains elevated with recent readings of 147/92 mmHg and 167/100 mmHg, particularly during stress and pain. Her current regimen includes amlodipine  5 mg(can't do 10mg  d/t edema) valsartan  320 mg, and chlorthalidone25mg . She reports improvement since changing her work environment, but blood pressure control is suboptimal. Continue the current antihypertensive regimen and monitor blood pressure regularly at  home. Schedule an in-person follow-up within the next three months.  Asthma   Asthma is managed with Symbicort  twice daily and albuterol  as needed. Albuterol  use is frequent, approximately once or twice a week. Reduced vaping due to the work environment may contribute to improved asthma control. Continue Symbicort  twice daily and use albuterol  as needed. Encourage continued reduction in vaping.  Migraine   Migraine management includes Aimovig  monthly and Maxalt  as needed. There are issues with pharmacy supply of Aimovig  leading to treatment gaps. Continue Aimovig  monthly and Maxalt  as needed. Coordinate with the pharmacy for timely refills of Aimovig .  Gastroesophageal reflux disease (GERD)   GERD is managed with pantoprazole , now taken twice daily due to increased symptoms. Continue pantoprazole  twice daily.  Attention-deficit hyperactivity disorder (ADHD)   ADHD is managed with Adderall twice daily. No significant side effects are reported, though her pulse is elevated in the mornings. Continue Adderall twice daily and monitor pulse and blood pressure regularly.  Insomnia   Insomnia is related to stress and racing thoughts, particularly with upcoming family events. She previously discontinued mirtazapine  and uses over-the-counter sleep aids as needed. Consider resuming mirtazapine  if insomnia persists and discuss non-pharmacological strategies for managing stress and improving sleep.    I discussed the assessment and treatment plan with the patient. The patient was provided an opportunity to ask questions and all were answered. The patient agreed with  the plan and demonstrated an understanding of the instructions.   The patient was advised to call back or seek an in-person evaluation if the symptoms worsen or if the condition fails to improve as anticipated.  Return in about 3 months (around 05/29/2024) for chronic follow-up, cancel Oct..  Jenkins Laurie Carrel, MD [facility] at  [facility] [phone] (phone) [phone] (fax)  [facility]

## 2024-02-28 NOTE — Telephone Encounter (Signed)
 Pharmacy Patient Advocate Encounter  Received notification from [facility] MEDICAID that Prior Authorization for tiZANidine  HCl 4MG  tablets  has been DENIED.  Full denial letter will be uploaded to the media tab. See denial reason below.   PA #/Case ID/Reference #: [id number]   Insurance is questioning regarding muscle relaxants methocarbamol  and tizanidine .

## 2024-02-28 NOTE — Patient Instructions (Signed)

## 2024-02-28 NOTE — Progress Notes (Signed)
 Complex Care Management Note Care Guide Note  02/28/2024 Name: Laurie Roman MRN: [id number] DOB: 1975/10/21   Complex Care Management Outreach Attempts: A third unsuccessful outreach was attempted today to offer the patient with information about available complex care management services.  Follow Up Plan:  No further outreach attempts will be made at this time. We have been unable to contact the patient to offer or enroll patient in complex care management services.  Encounter Outcome:  No Answer-Left voicemail  Leotis Rase [facility], [facility] Guide  Direct Dial: [phone]  Fax [phone]

## 2024-03-13 ENCOUNTER — Encounter: Payer: Self-pay | Admitting: Internal Medicine

## 2024-03-13 ENCOUNTER — Encounter: Payer: Self-pay | Admitting: Family Medicine

## 2024-03-28 ENCOUNTER — Ambulatory Visit: Payer: Self-pay | Admitting: Family Medicine

## 2024-03-28 ENCOUNTER — Encounter: Payer: Self-pay | Admitting: Family Medicine

## 2024-03-28 ENCOUNTER — Other Ambulatory Visit: Payer: Self-pay | Admitting: Family Medicine

## 2024-03-28 MED ORDER — AMPHETAMINE-DEXTROAMPHETAMINE 20 MG PO TABS: 20.0000 mg | ORAL_TABLET | Freq: Two times a day (BID) | ORAL | 0 refills | Status: DC

## 2024-03-31 ENCOUNTER — Telehealth: Payer: Self-pay

## 2024-03-31 ENCOUNTER — Other Ambulatory Visit (HOSPITAL_COMMUNITY): Payer: Self-pay

## 2024-03-31 NOTE — Telephone Encounter (Signed)
 Pharmacy Patient Advocate Encounter   Received notification from Onbase that prior authorization for Aimovig  140MG /ML auto-injectors is required/requested.   Insurance verification completed.   The patient is insured through [facility].   Per test claim: PA required; PA submitted to above mentioned insurance via Latent Key/confirmation #/EOC [facility] Status is pending

## 2024-04-03 NOTE — Telephone Encounter (Signed)
 Pharmacy Patient Advocate Encounter  Received notification from [employer] that Prior Authorization for  Aimovig  140MG /ML auto-injectors  has been APPROVED from 03/31/24 to 09/29/24   PA #/Case ID/Reference #: [id number]

## 2024-04-25 ENCOUNTER — Encounter: Payer: Self-pay | Admitting: Family Medicine

## 2024-04-26 ENCOUNTER — Other Ambulatory Visit: Payer: Self-pay

## 2024-04-26 ENCOUNTER — Other Ambulatory Visit: Payer: Self-pay | Admitting: Family Medicine

## 2024-04-26 MED ORDER — AMPHETAMINE-DEXTROAMPHETAMINE 20 MG PO TABS: 20.0000 mg | ORAL_TABLET | Freq: Two times a day (BID) | ORAL | 0 refills | Status: DC

## 2024-04-26 MED ORDER — VALACYCLOVIR HCL 1 G PO TABS: 2000.0000 mg | ORAL_TABLET | Freq: Two times a day (BID) | ORAL | 3 refills | Status: AC

## 2024-05-22 ENCOUNTER — Encounter: Payer: Self-pay | Admitting: Family Medicine

## 2024-05-22 ENCOUNTER — Other Ambulatory Visit: Payer: Self-pay | Admitting: Family Medicine

## 2024-05-22 MED ORDER — AMPHETAMINE-DEXTROAMPHETAMINE 20 MG PO TABS: 20.0000 mg | ORAL_TABLET | Freq: Two times a day (BID) | ORAL | 0 refills | Status: DC

## 2024-05-24 ENCOUNTER — Other Ambulatory Visit: Payer: Self-pay | Admitting: Family Medicine

## 2024-05-24 ENCOUNTER — Encounter: Payer: Self-pay | Admitting: Family Medicine

## 2024-05-24 ENCOUNTER — Telehealth: Payer: Self-pay

## 2024-05-24 DIAGNOSIS — F11288 Opioid dependence with other opioid-induced disorder: Secondary | ICD-10-CM

## 2024-05-24 DIAGNOSIS — M5416 Radiculopathy, lumbar region: Secondary | ICD-10-CM

## 2024-05-24 DIAGNOSIS — I10 Essential (primary) hypertension: Secondary | ICD-10-CM

## 2024-05-24 DIAGNOSIS — M5116 Intervertebral disc disorders with radiculopathy, lumbar region: Secondary | ICD-10-CM

## 2024-05-24 DIAGNOSIS — M5432 Sciatica, left side: Secondary | ICD-10-CM

## 2024-05-24 DIAGNOSIS — R202 Paresthesia of skin: Secondary | ICD-10-CM

## 2024-05-24 DIAGNOSIS — G8929 Other chronic pain: Secondary | ICD-10-CM

## 2024-05-24 DIAGNOSIS — S3992XA Unspecified injury of lower back, initial encounter: Secondary | ICD-10-CM

## 2024-05-24 DIAGNOSIS — S79911A Unspecified injury of right hip, initial encounter: Secondary | ICD-10-CM

## 2024-05-24 DIAGNOSIS — R2681 Unsteadiness on feet: Secondary | ICD-10-CM

## 2024-05-24 MED ORDER — TIZANIDINE HCL 4 MG PO TABS: 4.0000 mg | ORAL_TABLET | Freq: Every evening | ORAL | 0 refills | Status: DC | PRN

## 2024-05-24 MED ORDER — METHOCARBAMOL 1000 MG PO TABS: 1000.0000 mg | ORAL_TABLET | Freq: Two times a day (BID) | ORAL | 0 refills | Status: DC | PRN

## 2024-05-24 MED ORDER — VALSARTAN 320 MG PO TABS: 320.0000 mg | ORAL_TABLET | Freq: Every day | ORAL | 0 refills | Status: DC

## 2024-05-24 MED ORDER — GABAPENTIN 800 MG PO TABS: 800.0000 mg | ORAL_TABLET | Freq: Three times a day (TID) | ORAL | 0 refills | Status: DC

## 2024-05-24 NOTE — Telephone Encounter (Signed)
 Pharmacy Patient Advocate Encounter   Received notification from Onbase that prior authorization for Nurtec 75MG  dispersible tablets is required/requested.   Insurance verification completed.   The patient is insured through [facility].   Per test claim: PA required; PA submitted to above mentioned insurance via Latent Key/confirmation #/EOC [id number] Status is pending

## 2024-05-25 NOTE — Telephone Encounter (Signed)
 Pharmacy Patient Advocate Encounter  Received notification from [facility] that Prior Authorization for  Nurtec 75MG  dispersible tablets has been APPROVED from 05/24/24 to 08/22/24   PA #/Case ID/Reference #: [id number]

## 2024-05-29 ENCOUNTER — Other Ambulatory Visit (HOSPITAL_COMMUNITY): Payer: Self-pay

## 2024-06-21 ENCOUNTER — Encounter: Payer: Self-pay | Admitting: Family Medicine

## 2024-06-21 NOTE — Telephone Encounter (Signed)
 Patient has been scheduled for VV but is also requesting PCP give her a call.

## 2024-06-22 ENCOUNTER — Other Ambulatory Visit: Payer: Self-pay | Admitting: Family Medicine

## 2024-06-22 MED ORDER — AMPHETAMINE-DEXTROAMPHETAMINE 20 MG PO TABS: 20.0000 mg | ORAL_TABLET | Freq: Two times a day (BID) | ORAL | 0 refills | Status: AC

## 2024-07-03 ENCOUNTER — Telehealth: Payer: Self-pay | Admitting: Family Medicine

## 2024-07-03 ENCOUNTER — Encounter: Payer: Self-pay | Admitting: Family Medicine

## 2024-07-03 DIAGNOSIS — F5101 Primary insomnia: Secondary | ICD-10-CM

## 2024-07-03 DIAGNOSIS — M5416 Radiculopathy, lumbar region: Secondary | ICD-10-CM

## 2024-07-03 DIAGNOSIS — R202 Paresthesia of skin: Secondary | ICD-10-CM

## 2024-07-03 DIAGNOSIS — F11288 Opioid dependence with other opioid-induced disorder: Secondary | ICD-10-CM

## 2024-07-03 DIAGNOSIS — F902 Attention-deficit hyperactivity disorder, combined type: Secondary | ICD-10-CM

## 2024-07-03 DIAGNOSIS — K219 Gastro-esophageal reflux disease without esophagitis: Secondary | ICD-10-CM

## 2024-07-03 DIAGNOSIS — E538 Deficiency of other specified B group vitamins: Secondary | ICD-10-CM

## 2024-07-03 DIAGNOSIS — I10 Essential (primary) hypertension: Secondary | ICD-10-CM

## 2024-07-03 DIAGNOSIS — M5441 Lumbago with sciatica, right side: Secondary | ICD-10-CM

## 2024-07-03 DIAGNOSIS — J40 Bronchitis, not specified as acute or chronic: Secondary | ICD-10-CM

## 2024-07-03 DIAGNOSIS — G8929 Other chronic pain: Secondary | ICD-10-CM

## 2024-07-03 DIAGNOSIS — G43009 Migraine without aura, not intractable, without status migrainosus: Secondary | ICD-10-CM

## 2024-07-03 DIAGNOSIS — G43909 Migraine, unspecified, not intractable, without status migrainosus: Secondary | ICD-10-CM

## 2024-07-03 MED ORDER — HYDROXYZINE PAMOATE 25 MG PO CAPS: 25.0000 mg | ORAL_CAPSULE | Freq: Every evening | ORAL | 0 refills | Status: AC | PRN

## 2024-07-03 MED ORDER — VALSARTAN 320 MG PO TABS: 320.0000 mg | ORAL_TABLET | Freq: Every day | ORAL | 0 refills | Status: AC

## 2024-07-03 MED ORDER — RIZATRIPTAN BENZOATE 10 MG PO TABS: 10.0000 mg | ORAL_TABLET | ORAL | 3 refills | Status: AC | PRN

## 2024-07-03 MED ORDER — METHOCARBAMOL 1000 MG PO TABS: 1000.0000 mg | ORAL_TABLET | Freq: Two times a day (BID) | ORAL | 0 refills | Status: DC | PRN

## 2024-07-03 MED ORDER — GABAPENTIN 800 MG PO TABS: 800.0000 mg | ORAL_TABLET | Freq: Three times a day (TID) | ORAL | 0 refills | Status: AC

## 2024-07-03 MED ORDER — TIZANIDINE HCL 4 MG PO TABS: 4.0000 mg | ORAL_TABLET | Freq: Every evening | ORAL | 0 refills | Status: AC | PRN

## 2024-07-03 MED ORDER — AZITHROMYCIN 250 MG PO TABS: ORAL_TABLET | ORAL | 0 refills | Status: AC

## 2024-07-03 MED ORDER — AMLODIPINE BESYLATE 5 MG PO TABS: 5.0000 mg | ORAL_TABLET | Freq: Every day | ORAL | 1 refills | Status: AC

## 2024-07-03 NOTE — Patient Instructions (Signed)
 Hydroxyzine -try 1 for sleep and if not work, try 2.

## 2024-07-03 NOTE — Progress Notes (Signed)
 " MyChart Video Visit Virtual Visit via Video Note   This visit type was conducted w/patient consent. This format is felt to be most appropriate for this patient at this time. Physical exam was limited by quality of the video and audio technology used for the visit. CMA was able to get the patient set up on a video visit.  Patient location: Home. Patient and provider in visit Provider location: Office  I discussed the limitations of evaluation and management by telemedicine and the availability of in person appointments. The patient expressed understanding and agreed to proceed.  Visit Date: 07/03/2024  Today's healthcare provider: Jenkins [name], MD     Subjective:    Patient ID: Laurie Roman, female    DOB: 1975-08-10, 49 y.o.   MRN: [id number]  Chief Complaint  Patient presents with   Hypertension    Pt is here for chronic issues    Discussed the use of AI scribe software for clinical note transcription with the patient, who gave verbal consent to proceed.  History of Present Illness Laurie Roman is a 49 year old female with hypertension who presents with uncontrolled high blood pressure and associated symptoms.  She experiences uncontrolled high blood pressure, with readings reaching 151/110 mmHg, which she attributes to stress at work and difficulty scheduling medical appointments. She takes valsartan  320 mg daily and amlodipine  5 mg intermittently, but does not take amlodipine  consistently due to leg cramps. Potassium supplements are used for leg cramps, and her blood pressure remains high at home, even when not stressed.  Still high even when off adderall.  No cp/sob  She experiences leg cramps and burning sensations in her thighs, severe enough to wake her at night. Potassium supplements are taken to address the cramps but still gets.  Has lumbar dz and MRI cancelled d/t insurance change.  Still on-going.    She also has difficulty sleeping, often waking at 3 AM and unable to  return to sleep, despite taking trazodone  and mirtazapine  in past. Various medications for sleep have been tried without success, leading to frustration over her sleep issues.  She has a history of migraines, which have improved recently. She is not currently taking Aimovig  due to insurance issues but uses Maxalt  as needed. She reports that the only time she really gets a headache is when her blood pressure is high.  She reports a persistent cough and mucus drainage over the past three weeks, attributed to a cold. No COVID-19. She mentions a fever and being unable to take time off work due to illness. A humidifier is used at night to help with symptoms.  ADHD-adderall works well.  No SI.  Can't work well if not take it.    GERD-on protonix  bid-doing well.  No dysphagia  Her social history includes a stressful work environment where she feels overworked and underpaid. Family stressors include conflicts with her daughter and responsibilities related to her grandchildren. She is concerned about her family history of dementia, noting similar symptoms in her mother and grandmother.    Past Medical History:  Diagnosis Date   ADD (attention deficit disorder)    Anxiety    Asthma    Blood transfusion without reported diagnosis    Depression    GERD (gastroesophageal reflux disease)    IBS (irritable bowel syndrome)    Insomnia    Lumbago    Migraine    Opioid dependence (HCC) 08/21/2022   Explained 08/21/22 that 4 months hydrocodone  for pain  management for delays in dental care has resulted in hyperalgesia and dependence   Other chest pain 08/17/2022   Normal EKG and D-dimer  Associated with lifting on father Doesn't radiate  Nonexertional  Not substernal chest pain more to the side, feels related to sleeping on it wrong (reproducible)   Sciatica    Sleep apnea    Tietze's disease    Urinary incontinence 10/26/2022    Past Surgical History:  Procedure Laterality Date   APPENDECTOMY      CESAREAN SECTION     TONSILLECTOMY     TOTAL ABDOMINAL HYSTERECTOMY W/ BILATERAL SALPINGOOPHORECTOMY     TUBAL LIGATION      Outpatient Medications Prior to Visit  Medication Sig Dispense Refill   albuterol  (VENTOLIN  HFA) 108 (90 Base) MCG/ACT inhaler Inhale 2 puffs into the lungs every 6 (six) hours as needed for wheezing or shortness of breath. 18 g 1   amphetamine -dextroamphetamine  (ADDERALL) 20 MG tablet Take 1 tablet (20 mg total) by mouth 2 (two) times daily. 60 tablet 0   budesonide -formoterol  (SYMBICORT ) 160-4.5 MCG/ACT inhaler Inhale 2 puffs into the lungs 2 (two) times daily. 10.2 g 3   cyanocobalamin  (VITAMIN B12) 1000 MCG tablet Take 1,000 mcg by mouth daily.     diphenhydrAMINE-APAP, sleep, (GOODY PM PO) Take by mouth.     fluticasone  (FLONASE ) 50 MCG/ACT nasal spray Place 1 spray into both nostrils daily. 48 g 3   HYDROcodone -acetaminophen  (NORCO/VICODIN) 5-325 MG tablet Take 2 tablets by mouth every 6 (six) hours as needed for moderate pain (pain score 4-6).     ondansetron  (ZOFRAN ) 4 MG tablet Take 1 tablet (4 mg total) by mouth every 8 (eight) hours as needed for nausea or vomiting. 20 tablet 1   pantoprazole  (PROTONIX ) 40 MG tablet Take 1 tablet (40 mg total) by mouth 2 (two) times daily before a meal. 180 tablet 1   promethazine  (PHENERGAN ) 25 MG tablet Take 25 mg by mouth every 6 (six) hours as needed.     Syringe/Needle, Disp, (SYRINGE 3CC/25GX1) 25G X 1 3 ML MISC Use to give IM B12 injection monthly 12 each 0   amLODipine  (NORVASC ) 10 MG tablet Take 1 tablet (10 mg total) by mouth daily. Replaces amlodipine  5, for uncontrolled blood pressure. 90 tablet 3   chlorthalidone  (HYGROTON ) 25 MG tablet Take 1 tablet (25 mg total) by mouth daily. 30 tablet 1   Erenumab -aooe (AIMOVIG ) 140 MG/ML SOAJ Inject 140 mg into the skin every 30 (thirty) days. 1 mL 3   gabapentin  (NEURONTIN ) 800 MG tablet Take 1 tablet (800 mg total) by mouth 3 (three) times daily. Replaces prior dosing  schedule. 270 tablet 0   Methocarbamol  1000 MG TABS Take 1,000 mg by mouth 2 (two) times daily as needed. 180 tablet 0   rizatriptan  (MAXALT ) 10 MG tablet Take 1 tablet (10 mg total) by mouth as needed for migraine. May repeat in 2 hours if needed 10 tablet 3   tiZANidine  (ZANAFLEX ) 4 MG tablet Take 1 tablet (4 mg total) by mouth at bedtime as needed for muscle spasms. 90 tablet 0   valsartan  (DIOVAN ) 320 MG tablet Take 1 tablet (320 mg total) by mouth daily. 90 tablet 0   No facility-administered medications prior to visit.    Allergies[1]      Objective:     Physical Exam  Vitals and nursing note reviewed.  Constitutional:      General:  is not in acute distress.    Appearance: Normal  appearance.  HENT:     Head: Normocephalic.  Pulmonary:     Effort: No respiratory distress.  Skin:    General: Skin is dry.     Coloration: Skin is not pale.  Neurological:     Mental Status: Pt is alert and oriented to person, place, and time.  Psychiatric:        Mood and Affect: Mood normal.  Hard to keep on track   I personally spent a total of 45 minutes in the care of the patient today including preparing to see the patient, getting/reviewing separately obtained history, performing a medically appropriate exam/evaluation, counseling and educating, placing orders, and documenting clinical information in the EHR.   There were no vitals taken for this visit.  Wt Readings from Last 3 Encounters:  11/05/23 136 lb 4 oz (61.8 kg)  10/27/23 131 lb (59.4 kg)  09/28/23 138 lb 1.6 oz (62.6 kg)       Assessment & Plan:   Problem List Items Addressed This Visit       Cardiovascular and Mediastinum   Essential hypertension - Primary   Relevant Medications   amLODipine  (NORVASC ) 5 MG tablet   valsartan  (DIOVAN ) 320 MG tablet   Migraine   Relevant Medications   amLODipine  (NORVASC ) 5 MG tablet   valsartan  (DIOVAN ) 320 MG tablet   gabapentin  (NEURONTIN ) 800 MG tablet   Methocarbamol   1000 MG TABS   tiZANidine  (ZANAFLEX ) 4 MG tablet   rizatriptan  (MAXALT ) 10 MG tablet     Digestive   Gastroesophageal reflux disease without esophagitis     Nervous and Auditory   Chronic bilateral low back pain with sciatica   Relevant Medications   gabapentin  (NEURONTIN ) 800 MG tablet   Methocarbamol  1000 MG TABS   tiZANidine  (ZANAFLEX ) 4 MG tablet   hydrOXYzine  (VISTARIL ) 25 MG capsule     Other   ADHD   Opioid dependence (HCC)   Relevant Medications   gabapentin  (NEURONTIN ) 800 MG tablet   Primary insomnia   Other Visit Diagnoses       Lumbar radiculopathy       Relevant Medications   gabapentin  (NEURONTIN ) 800 MG tablet   Methocarbamol  1000 MG TABS   tiZANidine  (ZANAFLEX ) 4 MG tablet   hydrOXYzine  (VISTARIL ) 25 MG capsule     Paresthesia of skin       Relevant Medications   gabapentin  (NEURONTIN ) 800 MG tablet     Bronchitis           Assessment and Plan Assessment & Plan Essential hypertension   Hypertension remains uncontrolled with blood pressure at 151/110 mmHg. She inconsistently uses amlodipine , leading to rebound hypertension, while continuing valsartan  320 mg daily. Concerns about secondary causes such as adrenal tumors or pheochromocytoma exist. Stress exacerbates her condition, and her work schedule complicates regular appointments. She should take amlodipine  5 mg daily to prevent rebound hypertension. A 24-hour urine and cortisol labs are ordered to evaluate secondary causes. Referral to a hypertension clinic for specialized management will be considered.  Migraine without aura   Migraines have decreased in frequency and severity, managed with Maxalt  as needed. Insurance does not cover Aimovig , so she is not taking it. Migraines are associated with elevated blood pressure. Maxalt  prescription is refilled.  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 An MRI was previously ordered but not completed  due to insurance issues. Pain management continues with hydrocodone , though she reports inadequate relief and requests adjustment. An MRI is reordered through [employer] to evaluate the lumbar spine. Referral to sports medicine for further evaluation and management will be considered.  Insomnia   Chronic insomnia with difficulty maintaining sleep is exacerbated by stress and possibly related to menopause. Previous trials of mirtazapine , trazodone , and hydroxyzine  were ineffective. She reports waking up at night and difficulty returning to sleep. Retry hydroxyzine  for sleep, starting with one tablet and increasing to two if needed. Limited meds d/t hydrocodone  and adderall  Attention deficit hyperactivity disorder, combined type   ADHD is managed with Adderall, taken as needed based on stress and concentration levels. She reports brain fog and difficulty concentrating without medication. No current issues with suicidal ideation. Continue Adderall as needed, avoiding caffeine and Adderall for three days prior to upcoming lab tests.  Gastroesophageal reflux disease   GERD is managed with pantoprazole  twice daily. Symptoms are well-controlled unless consuming spicy foods or alcohol. Continue pantoprazole  twice daily.  General health maintenance   Routine health maintenance was discussed, including the need for a physical exam for insurance purposes. A physical exam is scheduled for insurance purposes.      Meds ordered this encounter  Medications   amLODipine  (NORVASC ) 5 MG tablet    Sig: Take 1 tablet (5 mg total) by mouth daily. Replaces amlodipine  5, for uncontrolled blood pressure.    Dispense:  90 tablet    Refill:  1   valsartan  (DIOVAN ) 320 MG tablet    Sig: Take 1 tablet (320 mg total) by mouth daily.    Dispense:  90 tablet    Refill:  0   gabapentin  (NEURONTIN ) 800 MG tablet    Sig:  Take 1 tablet (800 mg total) by mouth 3 (three) times daily. Replaces prior dosing schedule.    Dispense:  270 tablet    Refill:  0   Methocarbamol  1000 MG TABS    Sig: Take 1,000 mg by mouth 2 (two) times daily as needed.    Dispense:  180 tablet    Refill:  0   tiZANidine  (ZANAFLEX ) 4 MG tablet    Sig: Take 1 tablet (4 mg total) by mouth at bedtime as needed for muscle spasms.    Dispense:  90 tablet    Refill:  0   rizatriptan  (MAXALT ) 10 MG tablet    Sig: Take 1 tablet (10 mg total) by mouth as needed for migraine. May repeat in 2 hours if needed    Dispense:  10 tablet    Refill:  3   azithromycin  (ZITHROMAX ) 250 MG tablet    Sig: Take 2 tablets on day 1, then 1 tablet daily on days 2 through 5    Dispense:  6 tablet    Refill:  0   hydrOXYzine  (VISTARIL ) 25 MG capsule    Sig: Take 1 capsule (25 mg total) by mouth at bedtime as needed.    Dispense:  30 capsule    Refill:  0    I discussed the assessment and treatment plan with the patient. The patient was provided an opportunity to ask questions and all were answered. The patient agreed with the plan and demonstrated an understanding of the instructions.   The patient was advised to call back or seek an in-person evaluation if the symptoms worsen or if the condition fails to improve as anticipated.  Return for as sch in April.  Jenkins HERO  Wendolyn, MD [facility] [phone] (phone) [phone] (fax)  [facility]      [1]  Allergies Allergen Reactions   Cefazolin Hives    Only IV formulation can safely take by mouth keflex    Ondansetron  Hives   Other Hives    Odansetron injection.   Sulfa Antibiotics Rash and Dermatitis   Tramadol Hcl     Other reaction(s): chest pain   Amitriptyline      hallucination   Galcanezumab-Gnlm Other (See Comments)    galcanezumab-gnlm   Ultram [Tramadol] Other (See Comments)    Tachycardia   Nitrofurantoin  Itching    Red  welts.  nitrofurantoin    Oxycodone  Rash and Dermatitis    Only when taking multiple doses   Sulfasalazine Rash and Dermatitis   "

## 2024-07-04 ENCOUNTER — Encounter: Payer: Self-pay | Admitting: Family Medicine

## 2024-07-04 ENCOUNTER — Other Ambulatory Visit: Payer: Self-pay

## 2024-07-04 DIAGNOSIS — I10 Essential (primary) hypertension: Secondary | ICD-10-CM

## 2024-07-04 NOTE — Progress Notes (Signed)
 ser

## 2024-07-05 ENCOUNTER — Other Ambulatory Visit: Payer: Self-pay | Admitting: Family Medicine

## 2024-07-05 DIAGNOSIS — G8929 Other chronic pain: Secondary | ICD-10-CM

## 2024-07-05 MED ORDER — METHOCARBAMOL 500 MG PO TABS: 500.0000 mg | ORAL_TABLET | Freq: Three times a day (TID) | ORAL | 1 refills | Status: AC | PRN

## 2024-07-10 ENCOUNTER — Ambulatory Visit: Payer: Self-pay | Admitting: Family Medicine

## 2024-08-09 ENCOUNTER — Ambulatory Visit: Payer: Self-pay | Admitting: Family Medicine

## 2024-09-22 ENCOUNTER — Encounter: Payer: Self-pay | Admitting: Family Medicine

## 2024-10-04 ENCOUNTER — Ambulatory Visit: Payer: Self-pay | Admitting: Family Medicine
